# Patient Record
Sex: Female | Born: 1965 | Race: White | Hispanic: No | Marital: Married | Smoking: Never smoker
Health system: Southern US, Community
[De-identification: ages and names within clinical notes are randomized; demographics above are authoritative.]

## PROBLEM LIST (undated history)

## (undated) DIAGNOSIS — K219 Gastro-esophageal reflux disease without esophagitis: Secondary | ICD-10-CM

## (undated) DIAGNOSIS — I499 Cardiac arrhythmia, unspecified: Secondary | ICD-10-CM

## (undated) DIAGNOSIS — R000 Tachycardia, unspecified: Secondary | ICD-10-CM

## (undated) DIAGNOSIS — R42 Dizziness and giddiness: Secondary | ICD-10-CM

## (undated) DIAGNOSIS — F419 Anxiety disorder, unspecified: Secondary | ICD-10-CM

## (undated) DIAGNOSIS — F4310 Post-traumatic stress disorder, unspecified: Secondary | ICD-10-CM

## (undated) DIAGNOSIS — N189 Chronic kidney disease, unspecified: Secondary | ICD-10-CM

## (undated) DIAGNOSIS — F429 Obsessive-compulsive disorder, unspecified: Secondary | ICD-10-CM

## (undated) DIAGNOSIS — I10 Essential (primary) hypertension: Secondary | ICD-10-CM

## (undated) DIAGNOSIS — F319 Bipolar disorder, unspecified: Secondary | ICD-10-CM

## (undated) DIAGNOSIS — F329 Major depressive disorder, single episode, unspecified: Secondary | ICD-10-CM

## (undated) DIAGNOSIS — F32A Depression, unspecified: Secondary | ICD-10-CM

## (undated) HISTORY — DX: Anxiety disorder, unspecified: F41.9

## (undated) HISTORY — DX: Depression, unspecified: F32.A

## (undated) HISTORY — DX: Bipolar disorder, unspecified: F31.9

## (undated) HISTORY — DX: Tachycardia, unspecified: R00.0

## (undated) HISTORY — DX: Major depressive disorder, single episode, unspecified: F32.9

## (undated) HISTORY — DX: Post-traumatic stress disorder, unspecified: F43.10

## (undated) HISTORY — DX: Obsessive-compulsive disorder, unspecified: F42.9

## (undated) HISTORY — PX: OTHER SURGICAL HISTORY: SHX169

---

## 2004-08-14 ENCOUNTER — Emergency Department: Payer: Self-pay | Admitting: Emergency Medicine

## 2008-01-30 ENCOUNTER — Other Ambulatory Visit: Admission: RE | Admit: 2008-01-30 | Discharge: 2008-01-30 | Payer: Self-pay | Admitting: Gynecology

## 2008-01-30 ENCOUNTER — Encounter: Admission: RE | Admit: 2008-01-30 | Discharge: 2008-01-30 | Payer: Self-pay | Admitting: Gynecology

## 2011-06-02 DIAGNOSIS — Z0289 Encounter for other administrative examinations: Secondary | ICD-10-CM | POA: Insufficient documentation

## 2012-01-26 DIAGNOSIS — H68109 Unspecified obstruction of Eustachian tube, unspecified ear: Secondary | ICD-10-CM | POA: Insufficient documentation

## 2012-01-26 DIAGNOSIS — H6500 Acute serous otitis media, unspecified ear: Secondary | ICD-10-CM | POA: Insufficient documentation

## 2012-04-27 ENCOUNTER — Emergency Department: Payer: Self-pay | Admitting: Emergency Medicine

## 2013-10-09 ENCOUNTER — Emergency Department: Payer: Self-pay | Admitting: Emergency Medicine

## 2013-10-09 LAB — COMPREHENSIVE METABOLIC PANEL
ALBUMIN: 4 g/dL (ref 3.4–5.0)
Alkaline Phosphatase: 118 U/L — ABNORMAL HIGH
Anion Gap: 6 — ABNORMAL LOW (ref 7–16)
BILIRUBIN TOTAL: 0.3 mg/dL (ref 0.2–1.0)
BUN: 16 mg/dL (ref 7–18)
CALCIUM: 8.9 mg/dL (ref 8.5–10.1)
CHLORIDE: 106 mmol/L (ref 98–107)
CREATININE: 0.6 mg/dL (ref 0.60–1.30)
Co2: 23 mmol/L (ref 21–32)
GLUCOSE: 84 mg/dL (ref 65–99)
Osmolality: 270 (ref 275–301)
POTASSIUM: 3.6 mmol/L (ref 3.5–5.1)
SGOT(AST): 36 U/L (ref 15–37)
SGPT (ALT): 38 U/L (ref 12–78)
SODIUM: 135 mmol/L — AB (ref 136–145)
TOTAL PROTEIN: 8.6 g/dL — AB (ref 6.4–8.2)

## 2013-10-09 LAB — URINALYSIS, COMPLETE
BACTERIA: NONE SEEN
Bilirubin,UR: NEGATIVE
Glucose,UR: NEGATIVE mg/dL (ref 0–75)
LEUKOCYTE ESTERASE: NEGATIVE
Nitrite: NEGATIVE
PH: 5 (ref 4.5–8.0)
Protein: NEGATIVE
RBC,UR: 1 /HPF (ref 0–5)
Specific Gravity: 1.015 (ref 1.003–1.030)

## 2013-10-09 LAB — CBC
HCT: 41.5 % (ref 35.0–47.0)
HGB: 13.4 g/dL (ref 12.0–16.0)
MCH: 28.1 pg (ref 26.0–34.0)
MCHC: 32.3 g/dL (ref 32.0–36.0)
MCV: 87 fL (ref 80–100)
PLATELETS: 279 10*3/uL (ref 150–440)
RBC: 4.78 10*6/uL (ref 3.80–5.20)
RDW: 14.3 % (ref 11.5–14.5)
WBC: 9.1 10*3/uL (ref 3.6–11.0)

## 2013-10-09 LAB — TROPONIN I

## 2013-10-09 LAB — CK TOTAL AND CKMB (NOT AT ARMC)
CK, TOTAL: 68 U/L
CK-MB: <0.5 ng/mL — ABNORMAL LOW (ref 0.5–3.6)

## 2013-10-09 LAB — APTT: ACTIVATED PTT: 29.2 s (ref 23.6–35.9)

## 2014-08-07 ENCOUNTER — Ambulatory Visit: Payer: Self-pay | Admitting: Family Medicine

## 2014-09-01 ENCOUNTER — Ambulatory Visit
Admit: 2014-09-01 | Disposition: A | Discharge status: Home / Self Care | Payer: Self-pay | Attending: Family Medicine | Admitting: Family Medicine

## 2014-10-02 ENCOUNTER — Ambulatory Visit
Admit: 2014-10-02 | Disposition: A | Discharge status: Home / Self Care | Payer: Self-pay | Attending: Family Medicine | Admitting: Family Medicine

## 2014-10-13 DIAGNOSIS — E785 Hyperlipidemia, unspecified: Secondary | ICD-10-CM | POA: Insufficient documentation

## 2014-11-05 ENCOUNTER — Ambulatory Visit: Payer: Self-pay | Admitting: Dietician

## 2014-11-10 ENCOUNTER — Encounter: Payer: Self-pay | Admitting: Dietician

## 2014-11-10 NOTE — Progress Notes (Deleted)
Subjective:     Patient ID: Jennifer Newman, female   DOB: 20-Apr-1966, 49 y.o.   MRN: [id number]  HPI   Review of Systems     Objective:   Physical Exam     Assessment:     ***    Plan:     ***

## 2014-11-10 NOTE — Progress Notes (Signed)
Mailed discharge letter from [facility] program to Dr. Rutherford Nail.

## 2015-06-27 ENCOUNTER — Emergency Department: Payer: BLUE CROSS/BLUE SHIELD

## 2015-06-27 ENCOUNTER — Encounter: Payer: Self-pay | Admitting: *Deleted

## 2015-06-27 ENCOUNTER — Emergency Department
Admission: EM | Admit: 2015-06-27 | Discharge: 2015-06-27 | Disposition: A | Discharge status: Home / Self Care | Payer: BLUE CROSS/BLUE SHIELD | Attending: Emergency Medicine | Admitting: Emergency Medicine

## 2015-06-27 DIAGNOSIS — M7551 Bursitis of right shoulder: Secondary | ICD-10-CM | POA: Insufficient documentation

## 2015-06-27 DIAGNOSIS — M25511 Pain in right shoulder: Secondary | ICD-10-CM | POA: Diagnosis present

## 2015-06-27 DIAGNOSIS — I10 Essential (primary) hypertension: Secondary | ICD-10-CM | POA: Diagnosis not present

## 2015-06-27 HISTORY — DX: Essential (primary) hypertension: I10

## 2015-06-27 MED ORDER — HYDROCODONE-ACETAMINOPHEN 5-325 MG PO TABS: 1.0000 | ORAL_TABLET | ORAL | Status: DC | PRN

## 2015-06-27 MED ORDER — BACLOFEN 10 MG PO TABS: 10.0000 mg | ORAL_TABLET | Freq: Three times a day (TID) | ORAL | Status: DC

## 2015-06-27 MED ORDER — IBUPROFEN 800 MG PO TABS: 800.0000 mg | ORAL_TABLET | Freq: Three times a day (TID) | ORAL | Status: DC | PRN

## 2015-06-27 NOTE — ED Notes (Signed)
Pt verbalized understanding of discharge instructions. NAD at this time. 

## 2015-06-27 NOTE — ED Provider Notes (Signed)
[facility] Emergency Department Provider Note  ____________________________________________  Time seen: Approximately 12:18 PM  I have reviewed the triage vital signs and the nursing notes.   HISTORY  Chief Complaint Shoulder Pain   HPI Jennifer Newman is a 49 y.o. female presents with a two-day history of right shoulder pain. Patient states that she is unable to extend abduct and abduct or rotate her arm. Denies any specific trauma that she might have slept on it wrong. Past medical history significant for bursitis in that shoulder.   Past Medical History  Diagnosis Date  . Hypertension     There are no active problems to display for this patient.   History reviewed. No pertinent past surgical history.  Current Outpatient Rx  Name  Route  Sig  Dispense  Refill  . baclofen (LIORESAL) 10 MG tablet   Oral   Take 1 tablet (10 mg total) by mouth 3 (three) times daily.   30 tablet   0   . HYDROcodone-acetaminophen (NORCO) 5-325 MG tablet   Oral   Take 1-2 tablets by mouth every 4 (four) hours as needed for moderate pain.   15 tablet   0   . ibuprofen (ADVIL,MOTRIN) 800 MG tablet   Oral   Take 1 tablet (800 mg total) by mouth every 8 (eight) hours as needed.   30 tablet   0     Allergies Review of patient's allergies indicates not on file.  History reviewed. No pertinent family history.  Social History Social History  Substance Use Topics  . Smoking status: Never Smoker   . Smokeless tobacco: None  . Alcohol Use: Yes     Comment: occasionally    Review of Systems Constitutional: No fever/chills Eyes: No visual changes. ENT: No sore throat. Cardiovascular: Denies chest pain. Respiratory: Denies shortness of breath. Gastrointestinal: No abdominal pain.  No nausea, no vomiting.  No diarrhea.  No constipation. Genitourinary: Negative for dysuria. Musculoskeletal: Positive for right shoulder pain. Skin: Negative for  rash. Neurological: Negative for headaches, focal weakness or numbness.  10-point ROS otherwise negative.  ____________________________________________   PHYSICAL EXAM:  VITAL SIGNS: ED Triage Vitals  Enc Vitals Group     BP 06/27/15 1214 148/108 mmHg     Pulse Rate 06/27/15 1214 135     Resp 06/27/15 1214 16     Temp 06/27/15 1214 98.3 F (36.8 C)     Temp Source 06/27/15 1214 Oral     SpO2 --      Weight 06/27/15 1214 200 lb (90.719 kg)     Height 06/27/15 1214 5\' 3"  (1.6 m)     Head Cir --      Peak Flow --      Pain Score --      Pain Loc --      Pain Edu? --      Excl. in [place]? --     Constitutional: Alert and oriented. Well appearing and in no acute distress. Neck: No stridor.  No cervical spinal tenderness to palpation Cardiovascular: Normal rate, regular rhythm. Grossly normal heart sounds.  Good peripheral circulation. Respiratory: Normal respiratory effort.  No retractions. Lungs CTAB. Gastrointestinal: Soft and nontender. No distention. No abdominal bruits. No CVA tenderness. Musculoskeletal: Right shoulder with limited range of motion. Unable to abduct, adduct, extend or rotate. Distally neurovascularly intact. No evidence of bruising. Point tenderness with deep palpation Neurologic:  Normal speech and language. No gross focal neurologic deficits are appreciated. No gait  instability. Skin:  Skin is warm, dry and intact. No rash noted. Psychiatric: Mood and affect are normal. Speech and behavior are normal.  ____________________________________________   LABS (all labs ordered are listed, but only abnormal results are displayed)  Labs Reviewed - No data to display ____________________________________________   RADIOLOGY  IMPRESSION: No acute abnormality.  Findings compatible with calcific subacromial/subdeltoid bursitis versus calcific rotator cuff tendinopathy.  Mild appearing acromioclavicular  osteoarthritis. ____________________________________________   PROCEDURES  Procedure(s) performed: None  Critical Care performed: No  ____________________________________________   INITIAL IMPRESSION / ASSESSMENT AND PLAN / ED COURSE  Pertinent labs & imaging results that were available during my care of the patient were reviewed by me and considered in my medical decision making (see chart for details).  Suspect acute right shoulder strain with no specific injury. We'll get x-rays to confirm diagnosis patient is no osseous indications.  Acute right shoulder sling provided for comfort. Rx given for Motrin 800 mg 3 times a day, baclofen 10 mg 3 times a day, and 5/325 #8. Patient follow-up PCP or return here with any worsening symptomology. ____________________________________________   FINAL CLINICAL IMPRESSION(S) / ED DIAGNOSES  Final diagnoses:  Bursitis of shoulder region, right      Arlyss Repress, PA-C 06/27/15 1300  Orbie Pyo, MD 06/27/15 [phone]
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
## 2015-06-27 NOTE — Discharge Instructions (Signed)
Bursitis Bursitis is inflammation and irritation of a bursa, which is one of the small, fluid-filled sacs that cushion and protect the moving parts of your body. These sacs are located between bones and muscles, muscle attachments, or skin areas next to bones. A bursa protects these structures from the wear and tear that results from frequent movement. An inflamed bursa causes pain and swelling. Fluid may build up inside the sac. Bursitis is most common near joints, especially the knees, elbows, hips, and shoulders. CAUSES Bursitis can be caused by:   Injury from:  A direct blow, like falling on your knee or elbow.  Overuse of a joint (repetitive stress).  Infection. This can happen if bacteria gets into a bursa through a cut or scrape near a joint.  Diseases that cause joint inflammation, such as gout and rheumatoid arthritis. RISK FACTORS You may be at risk for bursitis if you:   Have a job or hobby that involves a lot of repetitive stress on your joints.  Have a condition that weakens your body's defense system (immune system), such as diabetes, cancer, or HIV.  Lift and reach overhead often.  Kneel or lean on hard surfaces often.  Run or walk often. SIGNS AND SYMPTOMS The most common signs and symptoms of bursitis are:  Pain that gets worse when you move the affected body part or put weight on it.  Inflammation.  Stiffness. Other signs and symptoms may include:  Redness.  Tenderness.  Warmth.  Pain that continues after rest.  Fever and chills. This may occur in bursitis caused by infection. DIAGNOSIS Bursitis may be diagnosed by:   Medical history and physical exam.  MRI.  A procedure to drain fluid from the bursa with a needle (aspiration). The fluid may be checked for signs of infection or gout.  Blood tests to rule out other causes of inflammation. TREATMENT  Bursitis can usually be treated at home with rest, ice, compression, and elevation (RICE). For  mild bursitis, RICE treatment may be all you need. Other treatments may include:  Nonsteroidal anti-inflammatory drugs (NSAIDs) to treat pain and inflammation.  Corticosteroids to fight inflammation. You may have these drugs injected into and around the area of bursitis.  Aspiration of bursitis fluid to relieve pain and improve movement.  Antibiotic medicine to treat an infected bursa.  A splint, brace, or walking aid.  Physical therapy if you continue to have pain or limited movement.  Surgery to remove a damaged or infected bursa. This may be needed if you have a very bad case of bursitis or if other treatments have not worked. HOME CARE INSTRUCTIONS   Take medicines only as directed by your health care provider.  If you were prescribed an antibiotic medicine, finish it all even if you start to feel better.  Rest the affected area as directed by your health care provider.  Keep the area elevated.  Avoid activities that make pain worse.  Apply ice to the injured area:  Place ice in a plastic bag.  Place a towel between your skin and the bag.  Leave the ice on for 20 minutes, 2-3 times a day.  Use splints, braces, pads, or walking aids as directed by your health care provider.  Keep all follow-up visits as directed by your health care provider. This is important. PREVENTION   Wear knee pads if you kneel often.  Wear sturdy running or walking shoes that fit you well.  Take regular breaks from repetitive activity.  Warm  up by stretching before doing any strenuous activity.  Maintain a healthy weight or lose weight as recommended by your health care provider. Ask your health care provider if you need help.  Exercise regularly. Start any new physical activity gradually. SEEK MEDICAL CARE IF:   Your bursitis is not responding to treatment or home care.  You have a fever.  You have chills.   This information is not intended to replace advice given to you by your  health care provider. Make sure you discuss any questions you have with your health care provider.   Document Released: 06/16/2000 Document Revised: 03/10/2015 Document Reviewed: 09/08/2013 Elsevier Interactive Patient Education 2016 [place] therapy can help ease sore, stiff, injured, and tight muscles and joints. Heat relaxes your muscles, which may help ease your pain.  RISKS AND COMPLICATIONS If you have any of the following conditions, do not use heat therapy unless your health care provider has approved:  Poor circulation.  Healing wounds or scarred skin in the area being treated.  Diabetes, heart disease, or high blood pressure.  Not being able to feel (numbness) the area being treated.  Unusual swelling of the area being treated.  Active infections.  Blood clots.  Cancer.  Inability to communicate pain. This may include young children and people who have problems with their brain function (dementia).  Pregnancy. Heat therapy should only be used on old, pre-existing, or long-lasting (chronic) injuries. Do not use heat therapy on new injuries unless directed by your health care provider. HOW TO USE HEAT THERAPY There are several different kinds of heat therapy, including:  Moist heat pack.  Warm water bath.  Hot water bottle.  Electric heating pad.  Heated gel pack.  Heated wrap.  Electric heating pad. Use the heat therapy method suggested by your health care provider. Follow your health care provider's instructions on when and how to use heat therapy. GENERAL HEAT THERAPY RECOMMENDATIONS  Do not sleep while using heat therapy. Only use heat therapy while you are awake.  Your skin may turn pink while using heat therapy. Do not use heat therapy if your skin turns red.  Do not use heat therapy if you have new pain.  High heat or long exposure to heat can cause burns. Be careful when using heat therapy to avoid burning your  skin.  Do not use heat therapy on areas of your skin that are already irritated, such as with a rash or sunburn. SEEK MEDICAL CARE IF:  You have blisters, redness, swelling, or numbness.  You have new pain.  Your pain is worse. MAKE SURE YOU:  Understand these instructions.  Will watch your condition.  Will get help right away if you are not doing well or get worse.   This information is not intended to replace advice given to you by your health care provider. Make sure you discuss any questions you have with your health care provider.   Document Released: 09/11/2011 Document Revised: 07/10/2014 Document Reviewed: 08/12/2013 Elsevier Interactive Patient Education [employer].

## 2016-12-11 ENCOUNTER — Inpatient Hospital Stay
Admission: EM | Admit: 2016-12-11 | Discharge: 2016-12-14 | DRG: 310 | Disposition: A | Discharge status: Home / Self Care | Payer: BLUE CROSS/BLUE SHIELD | Attending: Internal Medicine | Admitting: Internal Medicine

## 2016-12-11 ENCOUNTER — Emergency Department: Payer: BLUE CROSS/BLUE SHIELD

## 2016-12-11 ENCOUNTER — Encounter: Payer: Self-pay | Admitting: Emergency Medicine

## 2016-12-11 DIAGNOSIS — I471 Supraventricular tachycardia: Secondary | ICD-10-CM | POA: Diagnosis not present

## 2016-12-11 DIAGNOSIS — R0602 Shortness of breath: Secondary | ICD-10-CM

## 2016-12-11 DIAGNOSIS — R945 Abnormal results of liver function studies: Secondary | ICD-10-CM | POA: Diagnosis present

## 2016-12-11 DIAGNOSIS — R51 Headache: Secondary | ICD-10-CM | POA: Diagnosis present

## 2016-12-11 DIAGNOSIS — E860 Dehydration: Secondary | ICD-10-CM | POA: Diagnosis present

## 2016-12-11 DIAGNOSIS — R1010 Upper abdominal pain, unspecified: Secondary | ICD-10-CM | POA: Diagnosis present

## 2016-12-11 DIAGNOSIS — I10 Essential (primary) hypertension: Secondary | ICD-10-CM | POA: Diagnosis present

## 2016-12-11 DIAGNOSIS — R519 Headache, unspecified: Secondary | ICD-10-CM

## 2016-12-11 DIAGNOSIS — F329 Major depressive disorder, single episode, unspecified: Secondary | ICD-10-CM | POA: Diagnosis present

## 2016-12-11 DIAGNOSIS — Z79899 Other long term (current) drug therapy: Secondary | ICD-10-CM

## 2016-12-11 DIAGNOSIS — R000 Tachycardia, unspecified: Secondary | ICD-10-CM

## 2016-12-11 HISTORY — DX: Dizziness and giddiness: R42

## 2016-12-11 LAB — URINALYSIS, COMPLETE (UACMP) WITH MICROSCOPIC
Bilirubin Urine: NEGATIVE
GLUCOSE, UA: NEGATIVE mg/dL
HGB URINE DIPSTICK: NEGATIVE
Ketones, ur: NEGATIVE mg/dL
Leukocytes, UA: NEGATIVE
NITRITE: NEGATIVE
PH: 6 (ref 5.0–8.0)
PROTEIN: NEGATIVE mg/dL
Specific Gravity, Urine: 1.023 (ref 1.005–1.030)

## 2016-12-11 LAB — COMPREHENSIVE METABOLIC PANEL
ALK PHOS: 197 U/L — AB (ref 38–126)
ALT: 57 U/L — AB (ref 14–54)
ANION GAP: 11 (ref 5–15)
AST: 64 U/L — ABNORMAL HIGH (ref 15–41)
Albumin: 4.2 g/dL (ref 3.5–5.0)
BILIRUBIN TOTAL: 0.9 mg/dL (ref 0.3–1.2)
BUN: 17 mg/dL (ref 6–20)
CALCIUM: 9.3 mg/dL (ref 8.9–10.3)
CO2: 24 mmol/L (ref 22–32)
CREATININE: 0.64 mg/dL (ref 0.44–1.00)
Chloride: 101 mmol/L (ref 101–111)
GFR calc non Af Amer: >60 mL/min (ref >60)
Glucose, Bld: 140 mg/dL — ABNORMAL HIGH (ref 65–99)
Potassium: 4 mmol/L (ref 3.5–5.1)
Sodium: 136 mmol/L (ref 135–145)
TOTAL PROTEIN: 8.7 g/dL — AB (ref 6.5–8.1)

## 2016-12-11 LAB — CBC
HCT: 44 % (ref 35.0–47.0)
HEMOGLOBIN: 14.8 g/dL (ref 12.0–16.0)
MCH: 29.4 pg (ref 26.0–34.0)
MCHC: 33.6 g/dL (ref 32.0–36.0)
MCV: 87.3 fL (ref 80.0–100.0)
PLATELETS: 256 10*3/uL (ref 150–440)
RBC: 5.04 MIL/uL (ref 3.80–5.20)
RDW: 14.7 % — ABNORMAL HIGH (ref 11.5–14.5)
WBC: 8.9 10*3/uL (ref 3.6–11.0)

## 2016-12-11 LAB — LIPASE, BLOOD: Lipase: 18 U/L (ref 11–51)

## 2016-12-11 LAB — LACTIC ACID, PLASMA: Lactic Acid, Venous: 1.7 mmol/L (ref 0.5–1.9)

## 2016-12-11 LAB — TROPONIN I: Troponin I: <0.03 ng/mL (ref <0.03)

## 2016-12-11 MED ORDER — MORPHINE SULFATE (PF) 4 MG/ML IV SOLN
4.0000 mg | Freq: Once | INTRAVENOUS | Status: AC
  Administered 2016-12-11: 4 mg via INTRAVENOUS
  Filled 2016-12-11: qty 1

## 2016-12-11 MED ORDER — ONDANSETRON HCL 4 MG/2ML IJ SOLN
4.0000 mg | INTRAMUSCULAR | Status: AC
  Administered 2016-12-11: 4 mg via INTRAVENOUS
  Filled 2016-12-11: qty 2

## 2016-12-11 MED ORDER — ONDANSETRON 4 MG PO TBDP
4.0000 mg | ORAL_TABLET | Freq: Once | ORAL | Status: AC
  Administered 2016-12-11: 4 mg via ORAL
  Filled 2016-12-11: qty 1

## 2016-12-11 MED ORDER — SODIUM CHLORIDE 0.9 % IV BOLUS (SEPSIS)
1000.0000 mL | INTRAVENOUS | Status: AC
  Administered 2016-12-12: 1000 mL via INTRAVENOUS

## 2016-12-11 MED ORDER — IOPAMIDOL (ISOVUE-300) INJECTION 61%
30.0000 mL | Freq: Once | INTRAVENOUS | Status: AC | PRN
Start: 2016-12-11 — End: 2016-12-12
  Administered 2016-12-12: 30 mL via ORAL

## 2016-12-11 MED ORDER — SODIUM CHLORIDE 0.9 % IV BOLUS (SEPSIS)
1000.0000 mL | INTRAVENOUS | Status: AC
  Administered 2016-12-11: 1000 mL via INTRAVENOUS

## 2016-12-11 NOTE — ED Notes (Signed)
Pt states that she is nauseous, dizzy, and hives with body aches. Started Friday. Family at bedside.

## 2016-12-11 NOTE — ED Provider Notes (Signed)
[facility] Emergency Department Provider Note  ____________________________________________   First MD Initiated Contact with Patient 12/11/16 2232     (approximate)  I have reviewed the triage vital signs and the nursing notes.   HISTORY  Chief Complaint Abdominal Pain    HPI Jennifer Newman is a 51 y.o. female with no significant past medical history who presents for evaluation of several days of intermittent upper abdominal pain.  She states that the episode first started acutely within a few hours of eating on Friday which was 3 days ago.  The pain was moderate in intensity and felt both sharp and aching.  It was accompanied with some nausea and vomiting but all the symptoms were milder than they are today.  She states that she still had some mild residual aching discomfort over the weekend but it mostly was better.  She had a few loose stools.  Today after breakfast the pain, nausea, vomiting, and loose stools all came back acutely and are severe.  Nothing in particular makes the patient's symptoms better nor worse.  She has not been able to eat or drink anything essentially all day today.The pain is still located just below her rib cage in the center and on the right side of her upper abdomen.  She denies fever/chills and dysuria.  She feels short of breath as a result of the pain.   Past Medical History:  Diagnosis Date  . Hypertension     There are no active problems to display for this patient.   History reviewed. No pertinent surgical history.  Prior to Admission medications   Medication Sig Start Date End Date Taking? Authorizing Provider  baclofen (LIORESAL) 10 MG tablet Take 1 tablet (10 mg total) by mouth 3 (three) times daily. 06/27/15   Beers, Pierce Crane, PA-C  HYDROcodone-acetaminophen (NORCO) 5-325 MG tablet Take 1-2 tablets by mouth every 4 (four) hours as needed for moderate pain. 06/27/15   Beers, Pierce Crane, PA-C  ibuprofen  (ADVIL,MOTRIN) 800 MG tablet Take 1 tablet (800 mg total) by mouth every 8 (eight) hours as needed. 06/27/15   Arlyss Repress, PA-C    Allergies Patient has no known allergies.  History reviewed. No pertinent family history.  Social History Social History  Substance Use Topics  . Smoking status: Never Smoker  . Smokeless tobacco: Never Used  . Alcohol use Yes     Comment: occasionally    Review of Systems Constitutional: No fever/chills Eyes: No visual changes. ENT: No sore throat. Cardiovascular: Denies chest pain. Respiratory: shortness of breath Associated with the pain in her upper abdomen Gastrointestinal: Severe epigastric and right upper quadrant abdominal pain that occurred acutely 3 days ago and then again today, associated with nausea, vomiting, and diarrhea Genitourinary: Negative for dysuria. Musculoskeletal: Negative for neck pain.  Negative for back pain. Integumentary: Negative for rash. Neurological: Negative for headaches, focal weakness or numbness.   ____________________________________________   PHYSICAL EXAM:  VITAL SIGNS: ED Triage Vitals  Enc Vitals Group     BP 12/11/16 2226 (!) 152/102     Pulse Rate 12/11/16 2226 (!) 150     Resp 12/11/16 2226 20     Temp 12/11/16 2209 98.2 F (36.8 C)     Temp Source 12/11/16 2209 Oral     SpO2 12/11/16 2226 96 %     Weight 12/11/16 2209 90.7 kg (200 lb)     Height --      Head Circumference --  Peak Flow --      Pain Score 12/11/16 2225 3     Pain Loc --      Pain Edu? --      Excl. in [place]? --     Constitutional: Alert and oriented. The patient looks generally well although uncomfortable.  Nontoxic appearance in spite of her tachycardia Eyes: Conjunctivae are normal.  Head: Atraumatic. Neck: No stridor.  No meningeal signs.   Cardiovascular: Moderate tachycardia in the 150s, regular rhythm. Good peripheral circulation. Grossly normal heart sounds. Respiratory: Normal respiratory effort.  No  retractions. Lungs CTAB. Gastrointestinal: Obese.  Soft with severe tenderness to palpation of the epigastrium and right upper quadrant with positive Murphy sign.  Diffusely tender throughout the rest of her abdomen but without focal tenderness in the lower abdomen Musculoskeletal: No lower extremity tenderness nor edema. No gross deformities of extremities. Neurologic:  Normal speech and language. No gross focal neurologic deficits are appreciated.  Skin:  Skin is warm, dry and intact. No rash noted. Psychiatric: Mood and affect are normal. Speech and behavior are normal.  ____________________________________________   LABS (all labs ordered are listed, but only abnormal results are displayed)  Labs Reviewed  COMPREHENSIVE METABOLIC PANEL - Abnormal; Notable for the following:       Result Value   Glucose, Bld 140 (*)    Total Protein 8.7 (*)    AST 64 (*)    ALT 57 (*)    Alkaline Phosphatase 197 (*)    All other components within normal limits  CBC - Abnormal; Notable for the following:    RDW 14.7 (*)    All other components within normal limits  URINALYSIS, COMPLETE (UACMP) WITH MICROSCOPIC - Abnormal; Notable for the following:    Color, Urine YELLOW (*)    APPearance HAZY (*)    Bacteria, UA MANY (*)    Squamous Epithelial / LPF 0-5 (*)    All other components within normal limits  LIPASE, BLOOD  TROPONIN I  LACTIC ACID, PLASMA  LACTIC ACID, PLASMA   ____________________________________________  EKG  ED ECG REPORT I, Richard Ritchey, the attending physician, personally viewed and interpreted this ECG.  Date: 12/11/2016 EKG Time: 22:11 Rate: 157 Rhythm: normal sinus rhythm QRS Axis: normal Intervals: normal ST/T Wave abnormalities: normal Narrative Interpretation: unremarkable  ____________________________________________  RADIOLOGY   US Abdomen Limited Ruq  Result Date: 12/11/2016 CLINICAL DATA:  Acute onset of severe epigastric and right upper quadrant  abdominal pain. Nausea, loose stools and tachycardia. Initial encounter. EXAM: ULTRASOUND ABDOMEN LIMITED RIGHT UPPER QUADRANT COMPARISON:  None. FINDINGS: Gallbladder: No gallstones or wall thickening visualized. No sonographic Murphy sign noted by sonographer. Common bile duct: Diameter: 0.5 cm, within normal limits in caliber. Liver: No focal lesion identified. Diffuse increased parenchymal echogenicity and coarsened echotexture likely reflects fatty infiltration. IMPRESSION: 1. No acute abnormality at the right upper quadrant. 2. Diffuse fatty infiltration within the liver. Electronically Signed   By: Garald Balding M.D.   On: 12/11/2016 23:44    ____________________________________________   PROCEDURES  Critical Care performed: No   Procedure(s) performed:   Procedures   ____________________________________________   INITIAL IMPRESSION / ASSESSMENT AND PLAN / ED COURSE  Pertinent labs & imaging results that were available during my care of the patient were reviewed by me and considered in my medical decision making (see chart for details).  The patient's tachycardia is concerning and is likely the result of her pain, vomiting and diarrhea, decreased oral intake, and acute  illness.  She is, however, having no chest pain nor shortness of breath (except for the fact that breathing and especially taking deep breaths worsens the abdominal pain).  She is afebrile and has an SPO2 of 96%.  Her differential is broad but I suspect gallbladder disease is the most likely diagnosis.  Given the degree of her tachycardia I will provide 2 L of IV fluid and pain medication as well as Zofran (she vomited shortly after receiving an ODT Zofran as per nursing protocols).  We will obtain a right upper quadrant ultrasound.  If the ultrasound is normal I will proceed with CT angiogram chest to rule out PE and CT with oral and IV contrast of her abdomen and pelvis.  The patient and her husband understand and  agree with this plan.   Clinical Course as of Dec 12 33  Mon Dec 11, 2016  2331 The patient's lactic acid is within normal limits and she does not have a leukocytosis.  She has a slightly elevated AST, ALT, and alkaline phosphatase and I still suspected gallbladder disease is the most likely diagnosis.  [CF]  2350 Ultrasound is unremarkable.  Given no evidence of acute gallbladder disease, I will proceed with a CT scan of her abdomen and pelvis as well as CT angiogram of her chest to rule out pulmonary embolism.  I updated the patient and her husband.  [CF]  [place] ED care to Dr. Beather Arbour to follow up CT results and reassess.  [CF]    Clinical Course User Index [CF] Hinda Kehr, MD    ____________________________________________  FINAL CLINICAL IMPRESSION(S) / ED DIAGNOSES  Final diagnoses:  Shortness of breath  Tachycardia  Pain of upper abdomen     MEDICATIONS GIVEN DURING THIS VISIT:  Medications  ondansetron (ZOFRAN-ODT) disintegrating tablet 4 mg (4 mg Oral Given 12/11/16 2231)  sodium chloride 0.9 % bolus 1,000 mL (0 mLs Intravenous Stopped 12/12/16 0012)  morphine 4 MG/ML injection 4 mg (4 mg Intravenous Given 12/11/16 2246)  ondansetron (ZOFRAN) injection 4 mg (4 mg Intravenous Given 12/11/16 2256)  sodium chloride 0.9 % bolus 1,000 mL (1,000 mLs Intravenous New Bag/Given 12/12/16 0018)  iopamidol (ISOVUE-300) 61 % injection 30 mL (30 mLs Oral Contrast Given 12/12/16 0001)     NEW OUTPATIENT MEDICATIONS STARTED DURING THIS VISIT:  New Prescriptions   No medications on file    Modified Medications   No medications on file    Discontinued Medications   No medications on file     Note:  This document was prepared using Dragon voice recognition software and may include unintentional dictation errors.    Hinda Kehr, MD 12/12/16 [phone]

## 2016-12-11 NOTE — ED Triage Notes (Signed)
Pt to triage in wheelchair. Pt c/o upper abdominal pain, N/V/D since Friday. Pt is tachycardic in triage at 156 bpm, nauseas and pale in color.

## 2016-12-12 ENCOUNTER — Emergency Department: Payer: BLUE CROSS/BLUE SHIELD

## 2016-12-12 DIAGNOSIS — R000 Tachycardia, unspecified: Secondary | ICD-10-CM | POA: Diagnosis present

## 2016-12-12 LAB — CBC
HCT: 37.7 % (ref 35.0–47.0)
HEMOGLOBIN: 12.7 g/dL (ref 12.0–16.0)
MCH: 29.5 pg (ref 26.0–34.0)
MCHC: 33.8 g/dL (ref 32.0–36.0)
MCV: 87.3 fL (ref 80.0–100.0)
Platelets: 209 10*3/uL (ref 150–440)
RBC: 4.32 MIL/uL (ref 3.80–5.20)
RDW: 14.9 % — ABNORMAL HIGH (ref 11.5–14.5)
WBC: 7.3 10*3/uL (ref 3.6–11.0)

## 2016-12-12 LAB — BASIC METABOLIC PANEL
Anion gap: 7 (ref 5–15)
BUN: 12 mg/dL (ref 6–20)
CALCIUM: 8 mg/dL — AB (ref 8.9–10.3)
CO2: 27 mmol/L (ref 22–32)
CREATININE: 0.7 mg/dL (ref 0.44–1.00)
Chloride: 105 mmol/L (ref 101–111)
GFR calc non Af Amer: >60 mL/min (ref >60)
Glucose, Bld: 122 mg/dL — ABNORMAL HIGH (ref 65–99)
Potassium: 3.6 mmol/L (ref 3.5–5.1)
SODIUM: 139 mmol/L (ref 135–145)

## 2016-12-12 LAB — TSH: TSH: 1.016 u[IU]/mL (ref 0.350–4.500)

## 2016-12-12 LAB — T4, FREE: FREE T4: 0.64 ng/dL (ref 0.61–1.12)

## 2016-12-12 LAB — PREGNANCY, URINE: Preg Test, Ur: NEGATIVE

## 2016-12-12 MED ORDER — SODIUM CHLORIDE 0.9 % IV BOLUS (SEPSIS)
1000.0000 mL | Freq: Once | INTRAVENOUS | Status: AC
  Administered 2016-12-12: 1000 mL via INTRAVENOUS

## 2016-12-12 MED ORDER — MORPHINE SULFATE (PF) 4 MG/ML IV SOLN
4.0000 mg | Freq: Once | INTRAVENOUS | Status: AC
  Administered 2016-12-12: 4 mg via INTRAVENOUS
  Filled 2016-12-12: qty 1

## 2016-12-12 MED ORDER — ACETAMINOPHEN 650 MG RE SUPP: 650.0000 mg | Freq: Four times a day (QID) | RECTAL | Status: DC | PRN

## 2016-12-12 MED ORDER — FAMOTIDINE IN NACL 20-0.9 MG/50ML-% IV SOLN
20.0000 mg | Freq: Once | INTRAVENOUS | Status: AC
  Administered 2016-12-12: 20 mg via INTRAVENOUS
  Filled 2016-12-12: qty 50

## 2016-12-12 MED ORDER — METOPROLOL TARTRATE 5 MG/5ML IV SOLN
2.5000 mg | Freq: Once | INTRAVENOUS | Status: AC
  Administered 2016-12-12: 2.5 mg via INTRAVENOUS
  Filled 2016-12-12: qty 5

## 2016-12-12 MED ORDER — ENOXAPARIN SODIUM 40 MG/0.4ML ~~LOC~~ SOLN
40.0000 mg | Freq: Two times a day (BID) | SUBCUTANEOUS | Status: DC
  Administered 2016-12-12 – 2016-12-14 (×5): 40 mg via SUBCUTANEOUS
  Filled 2016-12-12 (×5): qty 0.4

## 2016-12-12 MED ORDER — METOPROLOL SUCCINATE ER 25 MG PO TB24
25.0000 mg | ORAL_TABLET | Freq: Two times a day (BID) | ORAL | Status: DC
  Administered 2016-12-12 (×2): 25 mg via ORAL
  Filled 2016-12-12 (×2): qty 1

## 2016-12-12 MED ORDER — SODIUM CHLORIDE 0.9 % IV SOLN
INTRAVENOUS | Status: DC
  Administered 2016-12-12 – 2016-12-13 (×3): via INTRAVENOUS

## 2016-12-12 MED ORDER — ONDANSETRON HCL 4 MG PO TABS: 4.0000 mg | ORAL_TABLET | Freq: Four times a day (QID) | ORAL | Status: DC | PRN

## 2016-12-12 MED ORDER — DULOXETINE HCL 30 MG PO CPEP
90.0000 mg | ORAL_CAPSULE | Freq: Every day | ORAL | Status: DC
  Administered 2016-12-13 – 2016-12-14 (×2): 90 mg via ORAL
  Filled 2016-12-12 (×2): qty 3

## 2016-12-12 MED ORDER — ACETAMINOPHEN 325 MG PO TABS
650.0000 mg | ORAL_TABLET | Freq: Four times a day (QID) | ORAL | Status: DC | PRN
  Administered 2016-12-12: 650 mg via ORAL
  Filled 2016-12-12: qty 2

## 2016-12-12 MED ORDER — DULOXETINE HCL 30 MG PO CPEP
60.0000 mg | ORAL_CAPSULE | Freq: Every day | ORAL | Status: DC
  Administered 2016-12-12: 60 mg via ORAL
  Filled 2016-12-12: qty 2

## 2016-12-12 MED ORDER — ACETAMINOPHEN 325 MG PO TABS
650.0000 mg | ORAL_TABLET | Freq: Four times a day (QID) | ORAL | Status: DC | PRN
  Administered 2016-12-12 – 2016-12-14 (×5): 650 mg via ORAL
  Filled 2016-12-12 (×5): qty 2

## 2016-12-12 MED ORDER — IOPAMIDOL (ISOVUE-370) INJECTION 76%
100.0000 mL | Freq: Once | INTRAVENOUS | Status: AC | PRN
  Administered 2016-12-12: 100 mL via INTRAVENOUS

## 2016-12-12 MED ORDER — MORPHINE SULFATE (PF) 2 MG/ML IV SOLN
2.0000 mg | INTRAVENOUS | Status: DC | PRN
  Administered 2016-12-12: 2 mg via INTRAVENOUS
  Filled 2016-12-12: qty 1

## 2016-12-12 MED ORDER — ONDANSETRON HCL 4 MG/2ML IJ SOLN
4.0000 mg | Freq: Four times a day (QID) | INTRAMUSCULAR | Status: DC | PRN
  Administered 2016-12-13: 4 mg via INTRAVENOUS
  Filled 2016-12-12: qty 2

## 2016-12-12 MED ORDER — ONDANSETRON HCL 4 MG/2ML IJ SOLN
4.0000 mg | Freq: Once | INTRAMUSCULAR | Status: AC
  Administered 2016-12-12: 4 mg via INTRAVENOUS
  Filled 2016-12-12: qty 2

## 2016-12-12 MED ORDER — METOPROLOL TARTRATE 5 MG/5ML IV SOLN
5.0000 mg | INTRAVENOUS | Status: DC | PRN
  Administered 2016-12-12 – 2016-12-13 (×3): 5 mg via INTRAVENOUS
  Filled 2016-12-12 (×2): qty 5

## 2016-12-12 NOTE — Progress Notes (Signed)
Patient seen and examined. Patient's abdominal pain has resolved. Patient's heart rate has improved. Continue to monitor for 24 hours for recurrent abdominal pain or tachycardia.  Agree with M.D. admitting plan.

## 2016-12-12 NOTE — ED Notes (Signed)
Gave pt Ginger Ale

## 2016-12-12 NOTE — Progress Notes (Deleted)
   12/12/16 1250  Clinical Encounter Type  Visited With Other (Comment)  Visit Type Spiritual support  Referral From Nurse   Chaplain responded to a request for consul for advance directive education. Patient not available as patient was discharged before Chaplain arrival.  Valentina Lucks, Chaplain

## 2016-12-12 NOTE — H&P (Signed)
[facility] [place] NAME: Jennifer Newman    MR#:  [id number]  DATE OF BIRTH:  08/02/1965  DATE OF ADMISSION:  12/11/2016  PRIMARY CARE PHYSICIAN: System, Pcp Not In   REQUESTING/REFERRING PHYSICIAN:   CHIEF COMPLAINT:   Chief Complaint  Patient presents with  . Abdominal Pain    HISTORY OF PRESENT ILLNESS: Jennifer Newman  is a 51 y.o. female with a known history of Hypertension presented to the emergency room with abdominal discomfort since Friday. The pain is generalized and aching in nature 4 out of 10 on a scale of 1-10. Patient said she ate some grilled chicken sandwich on Saturday and got some hives which later on resolved. No nausea and vomiting. An presented to the history and she was tachycardic with heart rate around 120 bpm. She was worked up with CT angiogram of the chest which showed no pulmonary embolism. She received 3 L of IV fluids in the emergency room but continued to be tachycardic. Abdominal pain improved. Patient had bowel movement yesterday. Hospitalist service was consulted for further care of the patient. No history of any cardiac arrhythmia.  PAST MEDICAL HISTORY:   Past Medical History:  Diagnosis Date  . Hypertension     PAST SURGICAL HISTORY: Past Surgical History:  Procedure Laterality Date  . none      SOCIAL HISTORY:  Social History  Substance Use Topics  . Smoking status: Never Smoker  . Smokeless tobacco: Never Used  . Alcohol use Yes     Comment: occasionally    FAMILY HISTORY:  Family History  Problem Relation Age of Onset  . COPD Neg Hx   . Diabetes Neg Hx   . Hypertension Neg Hx   . CAD Neg Hx     DRUG ALLERGIES: No Known Allergies  REVIEW OF SYSTEMS:   CONSTITUTIONAL: No fever, fatigue or weakness.  EYES: No blurred or double vision.  EARS, NOSE, AND THROAT: No tinnitus or ear pain.  RESPIRATORY: No cough, shortness of breath, wheezing or hemoptysis.  CARDIOVASCULAR: No  chest pain, orthopnea, edema.  GASTROINTESTINAL: No nausea, vomiting, diarrhea  has abdominal pain.  GENITOURINARY: No dysuria, hematuria.  ENDOCRINE: No polyuria, nocturia,  HEMATOLOGY: No anemia, easy bruising or bleeding SKIN: No rash or lesion. MUSCULOSKELETAL: No joint pain or arthritis.   NEUROLOGIC: No tingling, numbness, weakness.  PSYCHIATRY: No anxiety or depression.   MEDICATIONS AT HOME:  Prior to Admission medications   Medication Sig Start Date End Date Taking? Authorizing Provider  DULoxetine (CYMBALTA) 30 MG capsule Take 1 capsule by mouth daily. 12/04/16  Yes [provider]  DULoxetine (CYMBALTA) 60 MG capsule Take 1 capsule by mouth daily. 12/04/16  Yes [provider]  furosemide (LASIX) 40 MG tablet Take 1 tablet by mouth daily. 08/08/16 04/22/17 Yes [provider]  hydrochlorothiazide (HYDRODIURIL) 12.5 MG tablet Take 1 tablet by mouth daily. 08/08/16 08/08/17 Yes [provider]  loratadine (CLARITIN) 10 MG tablet Take 1 tablet by mouth daily. 03/03/14  Yes [provider]  metoprolol succinate (TOPROL-XL) 25 MG 24 hr tablet Take 1 tablet by mouth 2 (two) times daily. 08/08/16 08/08/17 Yes [provider]      PHYSICAL EXAMINATION:   VITAL SIGNS: Blood pressure 131/86, pulse (!) 135, temperature 98.2 F (36.8 C), temperature source Oral, resp. rate 19, weight 90.7 kg (200 lb), SpO2 91 %.  GENERAL:  51 y.o.-year-old patient lying in the bed with no acute distress.  EYES: Pupils equal, round, reactive to light and accommodation. No scleral icterus. Extraocular muscles intact.  HEENT: Head atraumatic, normocephalic. Oropharynx and nasopharynx clear.  NECK:  Supple, no jugular venous distention. No thyroid enlargement, no tenderness.  LUNGS: Normal breath sounds bilaterally, no wheezing, rales,rhonchi or crepitation. No use of accessory muscles of respiration.  CARDIOVASCULAR: S1, S2 tachycardia noted. No murmurs, rubs, or  gallops.  ABDOMEN: Soft, mild tenderness around umbilicus, nondistended. Bowel sounds present. No organomegaly or mass.  EXTREMITIES: No pedal edema, cyanosis, or clubbing.  NEUROLOGIC: Cranial nerves II through XII are intact. Muscle strength 5/5 in all extremities. Sensation intact. Gait not checked.  PSYCHIATRIC: The patient is alert and oriented x 3.  SKIN: No obvious rash, lesion, or ulcer.   LABORATORY PANEL:   CBC  Recent Labs Lab 12/11/16 2211  WBC 8.9  HGB 14.8  HCT 44.0  PLT 256  MCV 87.3  MCH 29.4  MCHC 33.6  RDW 14.7*   ------------------------------------------------------------------------------------------------------------------  Chemistries   Recent Labs Lab 12/11/16 2211  NA 136  K 4.0  CL 101  CO2 24  GLUCOSE 140*  BUN 17  CREATININE 0.64  CALCIUM 9.3  AST 64*  ALT 57*  ALKPHOS 197*  BILITOT 0.9   ------------------------------------------------------------------------------------------------------------------ CrCl cannot be calculated (Unknown ideal weight.). ------------------------------------------------------------------------------------------------------------------  Recent Labs  12/11/16 2211  TSH 1.016     Coagulation profile No results for input(s): INR, PROTIME in the last 168 hours. ------------------------------------------------------------------------------------------------------------------- No results for input(s): DDIMER in the last 72 hours. -------------------------------------------------------------------------------------------------------------------  Cardiac Enzymes  Recent Labs Lab 12/11/16 2211  TROPONINI <0.03   ------------------------------------------------------------------------------------------------------------------ Invalid input(s): POCBNP  ---------------------------------------------------------------------------------------------------------------  Urinalysis    Component Value  Date/Time   COLORURINE YELLOW (A) 12/11/2016 2211   APPEARANCEUR HAZY (A) 12/11/2016 2211   APPEARANCEUR Hazy 10/09/2013 1623   LABSPEC 1.023 12/11/2016 2211   LABSPEC 1.015 10/09/2013 1623   PHURINE 6.0 12/11/2016 2211   GLUCOSEU NEGATIVE 12/11/2016 2211   GLUCOSEU Negative 10/09/2013 1623   HGBUR NEGATIVE 12/11/2016 2211   BILIRUBINUR NEGATIVE 12/11/2016 2211   BILIRUBINUR Negative 10/09/2013 [place] 12/11/2016 2211   PROTEINUR NEGATIVE 12/11/2016 2211   NITRITE NEGATIVE 12/11/2016 2211   LEUKOCYTESUR NEGATIVE 12/11/2016 2211   LEUKOCYTESUR Negative 10/09/2013 1623     RADIOLOGY: Ct Angio Chest Pe W Or Wo Contrast  Result Date: 12/12/2016 CLINICAL DATA:  Acute onset of upper abdominal pain, nausea, vomiting and diarrhea. Tachycardia and nausea. Initial encounter. EXAM: CT ANGIOGRAPHY CHEST CT ABDOMEN AND PELVIS WITH CONTRAST TECHNIQUE: Multidetector CT imaging of the chest was performed using the standard protocol during bolus administration of intravenous contrast. Multiplanar CT image reconstructions and MIPs were obtained to evaluate the vascular anatomy. Multidetector CT imaging of the abdomen and pelvis was performed using the standard protocol during bolus administration of intravenous contrast. CONTRAST:  100 mL of Isovue 370 IV contrast COMPARISON:  Right upper quadrant ultrasound performed 12/11/2016 FINDINGS: CTA CHEST FINDINGS Cardiovascular: There is no evidence of aortic dissection. There is no evidence of aneurysmal dilatation. Minimal calcification is noted along the left subclavian artery. The heart remains borderline normal in size. There is no evidence of significant pulmonary embolus. Mediastinum/Nodes: The mediastinum is grossly unremarkable in appearance. No mediastinal lymphadenopathy is seen. No pericardial effusion is identified. The visualized portions of the thyroid gland are unremarkable. No axillary lymphadenopathy is seen. Lungs/Pleura: Mild  bilateral dependent subsegmental atelectasis is noted. No pleural effusion or pneumothorax is seen. No masses are identified. Musculoskeletal: No  acute osseous abnormalities are identified. The visualized musculature is unremarkable in appearance. Review of the MIP images confirms the above findings. CT ABDOMEN and PELVIS FINDINGS Hepatobiliary: The liver is unremarkable in appearance. The gallbladder is unremarkable in appearance. The common bile duct remains normal in caliber. Pancreas: The pancreas is within normal limits. Spleen: The spleen is unremarkable in appearance. Adrenals/Urinary Tract: The adrenal glands are unremarkable in appearance. The kidneys are within normal limits. There is no evidence of hydronephrosis. No renal or ureteral stones are identified. No perinephric stranding is seen. Stomach/Bowel: The stomach is unremarkable in appearance. The small bowel is within normal limits. The appendix is normal in caliber, without evidence of appendicitis. The colon is unremarkable in appearance. Vascular/Lymphatic: The abdominal aorta is unremarkable in appearance. The inferior vena cava is grossly unremarkable. No retroperitoneal lymphadenopathy is seen. No pelvic sidewall lymphadenopathy is identified. Reproductive: The bladder is mildly distended and grossly unremarkable. The uterus is unremarkable in appearance. The ovaries are relatively symmetric. No suspicious adnexal masses are seen. Other: Mild nonspecific haziness is noted within the proximal small bowel mesentery. Musculoskeletal: No acute osseous abnormalities are identified. The visualized musculature is unremarkable in appearance. Review of the MIP images confirms the above findings. IMPRESSION: 1. No evidence of significant pulmonary embolus. 2. No evidence of aortic dissection. No evidence of aneurysmal dilatation. 3. Mild bilateral dependent subsegmental atelectasis noted. Lungs otherwise clear. 4. Mild nonspecific haziness within the  proximal small bowel mesentery. This may reflect mesenteritis of uncertain chronicity. Would correlate with the patient's symptoms. Electronically Signed   By: Garald Balding M.D.   On: 12/12/2016 01:58   Ct Abdomen Pelvis W Contrast  Result Date: 12/12/2016 CLINICAL DATA:  Acute onset of upper abdominal pain, nausea, vomiting and diarrhea. Tachycardia and nausea. Initial encounter. EXAM: CT ANGIOGRAPHY CHEST CT ABDOMEN AND PELVIS WITH CONTRAST TECHNIQUE: Multidetector CT imaging of the chest was performed using the standard protocol during bolus administration of intravenous contrast. Multiplanar CT image reconstructions and MIPs were obtained to evaluate the vascular anatomy. Multidetector CT imaging of the abdomen and pelvis was performed using the standard protocol during bolus administration of intravenous contrast. CONTRAST:  100 mL of Isovue 370 IV contrast COMPARISON:  Right upper quadrant ultrasound performed 12/11/2016 FINDINGS: CTA CHEST FINDINGS Cardiovascular: There is no evidence of aortic dissection. There is no evidence of aneurysmal dilatation. Minimal calcification is noted along the left subclavian artery. The heart remains borderline normal in size. There is no evidence of significant pulmonary embolus. Mediastinum/Nodes: The mediastinum is grossly unremarkable in appearance. No mediastinal lymphadenopathy is seen. No pericardial effusion is identified. The visualized portions of the thyroid gland are unremarkable. No axillary lymphadenopathy is seen. Lungs/Pleura: Mild bilateral dependent subsegmental atelectasis is noted. No pleural effusion or pneumothorax is seen. No masses are identified. Musculoskeletal: No acute osseous abnormalities are identified. The visualized musculature is unremarkable in appearance. Review of the MIP images confirms the above findings. CT ABDOMEN and PELVIS FINDINGS Hepatobiliary: The liver is unremarkable in appearance. The gallbladder is unremarkable in  appearance. The common bile duct remains normal in caliber. Pancreas: The pancreas is within normal limits. Spleen: The spleen is unremarkable in appearance. Adrenals/Urinary Tract: The adrenal glands are unremarkable in appearance. The kidneys are within normal limits. There is no evidence of hydronephrosis. No renal or ureteral stones are identified. No perinephric stranding is seen. Stomach/Bowel: The stomach is unremarkable in appearance. The small bowel is within normal limits. The appendix is normal in caliber, without evidence of  appendicitis. The colon is unremarkable in appearance. Vascular/Lymphatic: The abdominal aorta is unremarkable in appearance. The inferior vena cava is grossly unremarkable. No retroperitoneal lymphadenopathy is seen. No pelvic sidewall lymphadenopathy is identified. Reproductive: The bladder is mildly distended and grossly unremarkable. The uterus is unremarkable in appearance. The ovaries are relatively symmetric. No suspicious adnexal masses are seen. Other: Mild nonspecific haziness is noted within the proximal small bowel mesentery. Musculoskeletal: No acute osseous abnormalities are identified. The visualized musculature is unremarkable in appearance. Review of the MIP images confirms the above findings. IMPRESSION: 1. No evidence of significant pulmonary embolus. 2. No evidence of aortic dissection. No evidence of aneurysmal dilatation. 3. Mild bilateral dependent subsegmental atelectasis noted. Lungs otherwise clear. 4. Mild nonspecific haziness within the proximal small bowel mesentery. This may reflect mesenteritis of uncertain chronicity. Would correlate with the patient's symptoms. Electronically Signed   By: Garald Balding M.D.   On: 12/12/2016 01:58   US Abdomen Limited Ruq  Result Date: 12/11/2016 CLINICAL DATA:  Acute onset of severe epigastric and right upper quadrant abdominal pain. Nausea, loose stools and tachycardia. Initial encounter. EXAM: ULTRASOUND  ABDOMEN LIMITED RIGHT UPPER QUADRANT COMPARISON:  None. FINDINGS: Gallbladder: No gallstones or wall thickening visualized. No sonographic Murphy sign noted by sonographer. Common bile duct: Diameter: 0.5 cm, within normal limits in caliber. Liver: No focal lesion identified. Diffuse increased parenchymal echogenicity and coarsened echotexture likely reflects fatty infiltration. IMPRESSION: 1. No acute abnormality at the right upper quadrant. 2. Diffuse fatty infiltration within the liver. Electronically Signed   By: Garald Balding M.D.   On: 12/11/2016 23:44    EKG: Orders placed or performed during the hospital encounter of 12/11/16  . ED EKG  . ED EKG    IMPRESSION AND PLAN: 51 year old female patient with history of hypertension presented to the emergency room with abdominal discomfort and tachycardia. Admitting diagnosis 1. Tachyarrhythmia 2. Abnormal liver function tests 3. Abdominal pain 4. Hypertension Treatment plan Admit patient to observation bed IV fluid hydration Oral metoprolol for hypertension and takes [place] Follow-up liver function tests Pain management with IV morphine DVT prophylaxis subcutaneous Lovenox 40 MG daily Supportive care All the records are reviewed and case discussed with ED provider. Management plans discussed with the patient, family and they are in agreement.  CODE STATUS:FULL CODE Code Status History    This patient does not have a recorded code status. Please follow your organizational policy for patients in this situation.       TOTAL TIME TAKING CARE OF THIS PATIENT: 50 minutes.    Saundra Shelling M.D on 12/12/2016 at 4:41 AM  Between 7am to 6pm - Pager - [phone]  After 6pm go to [email or web address] - password [email or web address] [facility]  Office  [phone]  CC: Primary care physician; System, Pcp Not In

## 2016-12-12 NOTE — Progress Notes (Signed)
Heart rate is in the 130's, notified Dr.Pyreddy. Received orders for iv metoprolol.

## 2016-12-12 NOTE — Progress Notes (Signed)
Lovenox changed to 40 mg BID for BMI >40 and CrCl >30. 

## 2016-12-12 NOTE — ED Provider Notes (Signed)
-----------------------------------------   1:06 AM on 12/12/2016 -----------------------------------------  Low-dose Lopressor ordered for hypertension. Patient remains tachycardic after 1L of fluids. Awaiting CT scan.   ----------------------------------------- 2:09 AM on 12/12/2016 -----------------------------------------  CT chest, abdomen/pelvis interpreted per Dr. Radene Knee: 1. No evidence of significant pulmonary embolus.  2. No evidence of aortic dissection. No evidence of aneurysmal  dilatation.  3. Mild bilateral dependent subsegmental atelectasis noted. Lungs  otherwise clear.  4. Mild nonspecific haziness within the proximal small bowel  mesentery. This may reflect mesenteritis of uncertain chronicity.  Would correlate with the patient's symptoms.   Patient resting in no acute distress. States overall her abdominal pain is much improved. Currently complains of headache. Blood pressure improved. Heart rate still sinus tachycardia in the 140s after 2 L IV fluid. Will order additional third liter, check thyroid panel. Will discuss with hospitalist to evaluate patient in the emergency department for admission.   Paulette Blanch, MD 12/12/16 [phone]

## 2016-12-12 NOTE — ED Notes (Signed)
Patient transported to CT 

## 2016-12-12 NOTE — Care Management (Signed)
No discharge needs identified by members of the care team 

## 2016-12-12 NOTE — Progress Notes (Signed)
   12/12/16 1250  Clinical Encounter Type  Visited With Patient  Visit Type Initial;Spiritual support  Referral From Nurse  Consult/Referral To Chaplain  Spiritual Encounters  Spiritual Needs Literature;Prayer;Emotional  Stress Factors  Patient Stress Factors Health changes  Family Stress Factors None identified  Advance Directives (For Healthcare)  Does Patient Have a Medical Advance Directive? No  Would patient like information on creating a medical advance directive? Yes (Inpatient - patient requests chaplain consult to create a medical advance directive)   Chaplain responded to request for patient education about an Forensic scientist. Chaplain provided a copy of the directive with the patient. Patient stated she would contact Chaplain's office. Patient would like to discuss with spouse before signing. Chaplain's office will follow up as needed.  Valentina Lucks, Chaplain

## 2016-12-12 NOTE — Progress Notes (Signed)
Patient admitted on 6/12 with complaints of a headache. Patient has had a recent attempt of suicide on her birthday. Patient states "i do not have a current plan." Patient is currently in therapy. Spouse is at bedside. Will continue to monitor and assess.

## 2016-12-13 ENCOUNTER — Inpatient Hospital Stay
Admit: 2016-12-13 | Discharge: 2016-12-13 | Disposition: A | Discharge status: Home / Self Care | Payer: BLUE CROSS/BLUE SHIELD | Attending: Internal Medicine | Admitting: Internal Medicine

## 2016-12-13 ENCOUNTER — Inpatient Hospital Stay: Payer: BLUE CROSS/BLUE SHIELD

## 2016-12-13 DIAGNOSIS — R945 Abnormal results of liver function studies: Secondary | ICD-10-CM | POA: Diagnosis present

## 2016-12-13 DIAGNOSIS — R0602 Shortness of breath: Secondary | ICD-10-CM | POA: Diagnosis present

## 2016-12-13 DIAGNOSIS — E860 Dehydration: Secondary | ICD-10-CM | POA: Diagnosis present

## 2016-12-13 DIAGNOSIS — R1010 Upper abdominal pain, unspecified: Secondary | ICD-10-CM | POA: Diagnosis present

## 2016-12-13 DIAGNOSIS — I471 Supraventricular tachycardia: Secondary | ICD-10-CM | POA: Diagnosis present

## 2016-12-13 DIAGNOSIS — R51 Headache: Secondary | ICD-10-CM | POA: Diagnosis present

## 2016-12-13 DIAGNOSIS — F329 Major depressive disorder, single episode, unspecified: Secondary | ICD-10-CM | POA: Diagnosis present

## 2016-12-13 DIAGNOSIS — I10 Essential (primary) hypertension: Secondary | ICD-10-CM | POA: Diagnosis present

## 2016-12-13 DIAGNOSIS — Z79899 Other long term (current) drug therapy: Secondary | ICD-10-CM | POA: Diagnosis not present

## 2016-12-13 LAB — TSH: TSH: 0.896 u[IU]/mL (ref 0.350–4.500)

## 2016-12-13 MED ORDER — FUROSEMIDE 10 MG/ML IJ SOLN
20.0000 mg | Freq: Once | INTRAMUSCULAR | Status: AC
  Administered 2016-12-13: 20 mg via INTRAVENOUS
  Filled 2016-12-13: qty 2

## 2016-12-13 MED ORDER — METOPROLOL SUCCINATE ER 50 MG PO TB24
50.0000 mg | ORAL_TABLET | Freq: Two times a day (BID) | ORAL | Status: DC
  Administered 2016-12-13 – 2016-12-14 (×3): 50 mg via ORAL
  Filled 2016-12-13 (×3): qty 1

## 2016-12-13 NOTE — Progress Notes (Addendum)
HR remains elevated in 130s- run of SVT/ pt asymptomatic / MD made aware/ cardio consult ordered

## 2016-12-13 NOTE — Consult Note (Signed)
[facility] Consultation Note  Patient ID: Jennifer Newman, MRN: [id number], DOB/AGE: 1966-02-25 51 y.o. Admit date: 12/11/2016   Date of Consult: 12/13/2016 Primary Physician: System, Pcp Not In Primary Cardiologist:None  Chief Complaint:  Chief Complaint  Patient presents with  . Abdominal Pain   Reason for Consult: tachycardia  HPI: 51 y.o. female with the known history of the essential hypertension but no other cardiovascular disease who is done fairly well with some appropriate medication management and has had a 4-5 days of anorexia abdominal pain and vomiting and diarrhea. This has progressed to the point where the patient is incredibly weak and unable to do anything whatsoever. Lying in the bed she is weak. There is been no evidence of chest discomfort or congestive heart failure symptoms with a chest x-ray which no evidence of pulmonary edema and a CT scan without evidence of pulmonary embolism. The patient does not have any significant new lower extremity edema or significant hepatosplenomegaly. The patient has had no evidence of myocardial infarction with a normal troponin but does have the atrial tachycardia at 150 bpm with left atrial enlargement which has been helped by her Toprol at 50 mg for now heart rate of 120 230 bpm. There has been what appears to be significant tachycardia due to weakness and possible dehydration which is slowly improving. There is no current primary cardiac involvement at this time  Past Medical History:  Diagnosis Date  . Hypertension       Surgical History:  Past Surgical History:  Procedure Laterality Date  . none       Home Meds: Prior to Admission medications   Medication Sig Start Date End Date Taking? Authorizing Provider  DULoxetine (CYMBALTA) 30 MG capsule Take 1 capsule by mouth daily. 12/04/16  Yes [provider]  DULoxetine (CYMBALTA) 60 MG capsule Take 1 capsule by mouth daily. 12/04/16  Yes [provider]  furosemide (LASIX) 40 MG tablet Take 1 tablet by mouth daily. 08/08/16 04/22/17 Yes [provider]  hydrochlorothiazide (HYDRODIURIL) 12.5 MG tablet Take 1 tablet by mouth daily. 08/08/16 08/08/17 Yes [provider]  loratadine (CLARITIN) 10 MG tablet Take 1 tablet by mouth daily. 03/03/14  Yes [provider]  metoprolol succinate (TOPROL-XL) 25 MG 24 hr tablet Take 1 tablet by mouth 2 (two) times daily. 08/08/16 08/08/17 Yes [provider]    Inpatient Medications:  . DULoxetine  90 mg Oral Daily  . enoxaparin (LOVENOX) injection  40 mg Subcutaneous BID  . metoprolol succinate  50 mg Oral BID   . sodium chloride 125 mL/hr at 12/13/16 0347    Allergies: No Known Allergies  Social History   Social History  . Marital status: Married    Spouse name: N/A  . Number of children: N/A  . Years of education: N/A   Occupational History  . customer service representative at [employer]    Social History Main Topics  . Smoking status: Never Smoker  . Smokeless tobacco: Never Used  . Alcohol use Yes     Comment: occasionally  . Drug use: No  . Sexual activity: Yes   Other Topics Concern  . Not on file   Social History Narrative  . No narrative on file     Family History  Problem Relation Age of Onset  . COPD Neg Hx   . Diabetes Neg Hx   . Hypertension Neg Hx   . CAD Neg Hx  Review of Systems Positive forAbdominal pain nausea vomiting Negative for: General:  chills, fever, night sweats or weight changes.  Cardiovascular: PND orthopnea syncope dizziness  Dermatological skin lesions rashes Respiratory: Cough congestion Urologic: Frequent urination urination at night and hematuria Abdominal: Positive for for nausea, vomiting, diarrhea, negative for bright red blood per rectum, melena, or hematemesis Neurologic: negative for visual changes, and/or hearing changes  All other systems reviewed and are otherwise negative except as noted  above.  Labs:  Recent Labs  12/11/16 2211  TROPONINI <0.03   Lab Results  Component Value Date   WBC 7.3 12/12/2016   HGB 12.7 12/12/2016   HCT 37.7 12/12/2016   MCV 87.3 12/12/2016   PLT 209 12/12/2016    Recent Labs Lab 12/11/16 2211 12/12/16 0625  NA 136 139  K 4.0 3.6  CL 101 105  CO2 24 27  BUN 17 12  CREATININE 0.64 0.70  CALCIUM 9.3 8.0*  PROT 8.7*  --   BILITOT 0.9  --   ALKPHOS 197*  --   ALT 57*  --   AST 64*  --   GLUCOSE 140* 122*   No results found for: CHOL, HDL, LDLCALC, TRIG No results found for: DDIMER  Radiology/Studies:  Ct Angio Chest Pe W Or Wo Contrast  Result Date: 12/12/2016 CLINICAL DATA:  Acute onset of upper abdominal pain, nausea, vomiting and diarrhea. Tachycardia and nausea. Initial encounter. EXAM: CT ANGIOGRAPHY CHEST CT ABDOMEN AND PELVIS WITH CONTRAST TECHNIQUE: Multidetector CT imaging of the chest was performed using the standard protocol during bolus administration of intravenous contrast. Multiplanar CT image reconstructions and MIPs were obtained to evaluate the vascular anatomy. Multidetector CT imaging of the abdomen and pelvis was performed using the standard protocol during bolus administration of intravenous contrast. CONTRAST:  100 mL of Isovue 370 IV contrast COMPARISON:  Right upper quadrant ultrasound performed 12/11/2016 FINDINGS: CTA CHEST FINDINGS Cardiovascular: There is no evidence of aortic dissection. There is no evidence of aneurysmal dilatation. Minimal calcification is noted along the left subclavian artery. The heart remains borderline normal in size. There is no evidence of significant pulmonary embolus. Mediastinum/Nodes: The mediastinum is grossly unremarkable in appearance. No mediastinal lymphadenopathy is seen. No pericardial effusion is identified. The visualized portions of the thyroid gland are unremarkable. No axillary lymphadenopathy is seen. Lungs/Pleura: Mild bilateral dependent subsegmental atelectasis  is noted. No pleural effusion or pneumothorax is seen. No masses are identified. Musculoskeletal: No acute osseous abnormalities are identified. The visualized musculature is unremarkable in appearance. Review of the MIP images confirms the above findings. CT ABDOMEN and PELVIS FINDINGS Hepatobiliary: The liver is unremarkable in appearance. The gallbladder is unremarkable in appearance. The common bile duct remains normal in caliber. Pancreas: The pancreas is within normal limits. Spleen: The spleen is unremarkable in appearance. Adrenals/Urinary Tract: The adrenal glands are unremarkable in appearance. The kidneys are within normal limits. There is no evidence of hydronephrosis. No renal or ureteral stones are identified. No perinephric stranding is seen. Stomach/Bowel: The stomach is unremarkable in appearance. The small bowel is within normal limits. The appendix is normal in caliber, without evidence of appendicitis. The colon is unremarkable in appearance. Vascular/Lymphatic: The abdominal aorta is unremarkable in appearance. The inferior vena cava is grossly unremarkable. No retroperitoneal lymphadenopathy is seen. No pelvic sidewall lymphadenopathy is identified. Reproductive: The bladder is mildly distended and grossly unremarkable. The uterus is unremarkable in appearance. The ovaries are relatively symmetric. No suspicious adnexal masses are seen. Other: Mild nonspecific haziness  is noted within the proximal small bowel mesentery. Musculoskeletal: No acute osseous abnormalities are identified. The visualized musculature is unremarkable in appearance. Review of the MIP images confirms the above findings. IMPRESSION: 1. No evidence of significant pulmonary embolus. 2. No evidence of aortic dissection. No evidence of aneurysmal dilatation. 3. Mild bilateral dependent subsegmental atelectasis noted. Lungs otherwise clear. 4. Mild nonspecific haziness within the proximal small bowel mesentery. This may reflect  mesenteritis of uncertain chronicity. Would correlate with the patient's symptoms. Electronically Signed   By: Garald Balding M.D.   On: 12/12/2016 01:58   Ct Abdomen Pelvis W Contrast  Result Date: 12/12/2016 CLINICAL DATA:  Acute onset of upper abdominal pain, nausea, vomiting and diarrhea. Tachycardia and nausea. Initial encounter. EXAM: CT ANGIOGRAPHY CHEST CT ABDOMEN AND PELVIS WITH CONTRAST TECHNIQUE: Multidetector CT imaging of the chest was performed using the standard protocol during bolus administration of intravenous contrast. Multiplanar CT image reconstructions and MIPs were obtained to evaluate the vascular anatomy. Multidetector CT imaging of the abdomen and pelvis was performed using the standard protocol during bolus administration of intravenous contrast. CONTRAST:  100 mL of Isovue 370 IV contrast COMPARISON:  Right upper quadrant ultrasound performed 12/11/2016 FINDINGS: CTA CHEST FINDINGS Cardiovascular: There is no evidence of aortic dissection. There is no evidence of aneurysmal dilatation. Minimal calcification is noted along the left subclavian artery. The heart remains borderline normal in size. There is no evidence of significant pulmonary embolus. Mediastinum/Nodes: The mediastinum is grossly unremarkable in appearance. No mediastinal lymphadenopathy is seen. No pericardial effusion is identified. The visualized portions of the thyroid gland are unremarkable. No axillary lymphadenopathy is seen. Lungs/Pleura: Mild bilateral dependent subsegmental atelectasis is noted. No pleural effusion or pneumothorax is seen. No masses are identified. Musculoskeletal: No acute osseous abnormalities are identified. The visualized musculature is unremarkable in appearance. Review of the MIP images confirms the above findings. CT ABDOMEN and PELVIS FINDINGS Hepatobiliary: The liver is unremarkable in appearance. The gallbladder is unremarkable in appearance. The common bile duct remains normal in  caliber. Pancreas: The pancreas is within normal limits. Spleen: The spleen is unremarkable in appearance. Adrenals/Urinary Tract: The adrenal glands are unremarkable in appearance. The kidneys are within normal limits. There is no evidence of hydronephrosis. No renal or ureteral stones are identified. No perinephric stranding is seen. Stomach/Bowel: The stomach is unremarkable in appearance. The small bowel is within normal limits. The appendix is normal in caliber, without evidence of appendicitis. The colon is unremarkable in appearance. Vascular/Lymphatic: The abdominal aorta is unremarkable in appearance. The inferior vena cava is grossly unremarkable. No retroperitoneal lymphadenopathy is seen. No pelvic sidewall lymphadenopathy is identified. Reproductive: The bladder is mildly distended and grossly unremarkable. The uterus is unremarkable in appearance. The ovaries are relatively symmetric. No suspicious adnexal masses are seen. Other: Mild nonspecific haziness is noted within the proximal small bowel mesentery. Musculoskeletal: No acute osseous abnormalities are identified. The visualized musculature is unremarkable in appearance. Review of the MIP images confirms the above findings. IMPRESSION: 1. No evidence of significant pulmonary embolus. 2. No evidence of aortic dissection. No evidence of aneurysmal dilatation. 3. Mild bilateral dependent subsegmental atelectasis noted. Lungs otherwise clear. 4. Mild nonspecific haziness within the proximal small bowel mesentery. This may reflect mesenteritis of uncertain chronicity. Would correlate with the patient's symptoms. Electronically Signed   By: Garald Balding M.D.   On: 12/12/2016 01:58   US Abdomen Limited Ruq  Result Date: 12/11/2016 CLINICAL DATA:  Acute onset of severe epigastric and right  upper quadrant abdominal pain. Nausea, loose stools and tachycardia. Initial encounter. EXAM: ULTRASOUND ABDOMEN LIMITED RIGHT UPPER QUADRANT COMPARISON:  None.  FINDINGS: Gallbladder: No gallstones or wall thickening visualized. No sonographic Murphy sign noted by sonographer. Common bile duct: Diameter: 0.5 cm, within normal limits in caliber. Liver: No focal lesion identified. Diffuse increased parenchymal echogenicity and coarsened echotexture likely reflects fatty infiltration. IMPRESSION: 1. No acute abnormality at the right upper quadrant. 2. Diffuse fatty infiltration within the liver. Electronically Signed   By: Garald Balding M.D.   On: 12/11/2016 23:44    EKG: Atrial tachycardia with 150 bpm  Weights: Filed Weights   12/11/16 2209 12/12/16 0600  Weight: 90.7 kg (200 lb) 103.4 kg (227 lb 14.4 oz)     Physical Exam: Blood pressure (!) 142/82, pulse (!) 138, temperature 98.1 F (36.7 C), temperature source Oral, resp. rate 20, height 5\' 3"  (1.6 m), weight 103.4 kg (227 lb 14.4 oz), SpO2 94 %. Body mass index is 40.37 kg/m. General: Well developed, well nourished, in no acute distress. Head eyes ears nose throat: Normocephalic, atraumatic, sclera non-icteric, no xanthomas, nares are without discharge. No apparent thyromegaly and/or mass  Lungs: Normal respiratory effort.  no wheezes, no rales, no rhonchi.  Heart:Tachycardic with normal S1 S2. no murmur gallop, no rub, PMI is normal size and placement, carotid upstroke normal without bruit, jugular venous pressure is normal Abdomen: Soft,  tender,  distended with normoactive bowel sounds. No hepatomegaly. No rebound/guarding. No obvious abdominal masses. Abdominal aorta is normal size without bruit Extremities:Trace edema. no cyanosis, no clubbing, no ulcers  Peripheral : 2+ bilateral upper extremity pulses, 2+ bilateral femoral pulses, 2+ bilateral dorsal pedal pulse Neuro: Alert and oriented. No facial asymmetry. No focal deficit. Moves all extremities spontaneously. Musculoskeletal: Normal muscle tone without kyphosis Psych:  Responds to questions appropriately with a normal affect.     Assessment: 51 year old female with essential hypertension having significant abdominal discomfort weakness and fatigue without evidence of myocardial infarction or congestive heart failure at this time with tachycardia most likely secondary to her current medical condition  Plan: 1. Continue metoprolol at current dose for heart rate control of less than 120 bpm 2. Hydration and further treatment of the nausea vomiting and diarrhea 3. Further investigation of abdominal discomfort as primary cause of above 4. Echocardiogram for LV systolic dysfunction valvular heart disease potentially contributing to above with further treatment options  Signed, Corey Skains M.D. [facility] 12/13/2016, 8:49 AM

## 2016-12-13 NOTE — Progress Notes (Signed)
[facility] NAME: Jennifer Newman    MR#:  [id number]  DATE OF BIRTH:  1966-01-31  SUBJECTIVE:   Patient has headache this morning. Telemetry monitoring shows fluctuations in heart rate. Patient is complaining of shortness of breath and palpitations at times.  REVIEW OF SYSTEMS:    Review of Systems  Constitutional: Negative for fever, chills weight loss Feels weak and tired today HENT: Negative for ear pain, nosebleeds, congestion, facial swelling, rhinorrhea, neck pain, neck stiffness and ear discharge.   Respiratory: Negative for cough, , wheezing positive for shortness of breath Cardiovascular: Negative for chest pain,++ palpitations and no leg swelling.  Gastrointestinal: Negative for heartburn, abdominal pain, vomiting, diarrhea or consitpation Genitourinary: Negative for dysuria, urgency, frequency, hematuria Musculoskeletal: Negative for back pain or joint pain Neurological: Negative for dizziness, seizures, syncope, focal weakness,  numbness and headaches.  Hematological: Does not bruise/bleed easily.  Psychiatric/Behavioral: Negative for hallucinations, confusion, dysphoric mood    Tolerating Diet:yes      DRUG ALLERGIES:  No Known Allergies  VITALS:  Blood pressure (!) 142/82, pulse (!) 138, temperature 98.1 F (36.7 C), temperature source Oral, resp. rate 20, height 5\' 3"  (1.6 m), weight 103.4 kg (227 lb 14.4 oz), SpO2 94 %.  PHYSICAL EXAMINATION:  Constitutional: Obese not in distress HENT: Normocephalic. Marland Kitchen Oropharynx is clear and moist.  Eyes: Conjunctivae and EOM are normal. PERRLA, no scleral icterus.  Neck: Normal ROM. Neck supple. No JVD. No tracheal deviation. CVS: Tachycardic, S1/S2 +, no murmurs, no gallops, no carotid bruit.  Pulmonary: Effort and breath sounds normal, no stridor, rhonchi, wheezes, rales.  Abdominal: Soft. BS +,  no distension, tenderness, rebound or guarding.  Musculoskeletal:  Normal range of motion. No edema and no tenderness.  Neuro: Alert. CN 2-12 grossly intact. No focal deficits. Skin: Skin is warm and dry. No rash noted. Psychiatric: Normal mood and affect.      LABORATORY PANEL:   CBC  Recent Labs Lab 12/12/16 0625  WBC 7.3  HGB 12.7  HCT 37.7  PLT 209   ------------------------------------------------------------------------------------------------------------------  Chemistries   Recent Labs Lab 12/11/16 2211 12/12/16 0625  NA 136 139  K 4.0 3.6  CL 101 105  CO2 24 27  GLUCOSE 140* 122*  BUN 17 12  CREATININE 0.64 0.70  CALCIUM 9.3 8.0*  AST 64*  --   ALT 57*  --   ALKPHOS 197*  --   BILITOT 0.9  --    ------------------------------------------------------------------------------------------------------------------  Cardiac Enzymes  Recent Labs Lab 12/11/16 2211  TROPONINI <0.03   ------------------------------------------------------------------------------------------------------------------  RADIOLOGY:  Ct Angio Chest Pe W Or Wo Contrast  Result Date: 12/12/2016 CLINICAL DATA:  Acute onset of upper abdominal pain, nausea, vomiting and diarrhea. Tachycardia and nausea. Initial encounter. EXAM: CT ANGIOGRAPHY CHEST CT ABDOMEN AND PELVIS WITH CONTRAST TECHNIQUE: Multidetector CT imaging of the chest was performed using the standard protocol during bolus administration of intravenous contrast. Multiplanar CT image reconstructions and MIPs were obtained to evaluate the vascular anatomy. Multidetector CT imaging of the abdomen and pelvis was performed using the standard protocol during bolus administration of intravenous contrast. CONTRAST:  100 mL of Isovue 370 IV contrast COMPARISON:  Right upper quadrant ultrasound performed 12/11/2016 FINDINGS: CTA CHEST FINDINGS Cardiovascular: There is no evidence of aortic dissection. There is no evidence of aneurysmal dilatation. Minimal calcification is noted along the left subclavian  artery. The heart remains borderline normal in size. There is no evidence of significant  pulmonary embolus. Mediastinum/Nodes: The mediastinum is grossly unremarkable in appearance. No mediastinal lymphadenopathy is seen. No pericardial effusion is identified. The visualized portions of the thyroid gland are unremarkable. No axillary lymphadenopathy is seen. Lungs/Pleura: Mild bilateral dependent subsegmental atelectasis is noted. No pleural effusion or pneumothorax is seen. No masses are identified. Musculoskeletal: No acute osseous abnormalities are identified. The visualized musculature is unremarkable in appearance. Review of the MIP images confirms the above findings. CT ABDOMEN and PELVIS FINDINGS Hepatobiliary: The liver is unremarkable in appearance. The gallbladder is unremarkable in appearance. The common bile duct remains normal in caliber. Pancreas: The pancreas is within normal limits. Spleen: The spleen is unremarkable in appearance. Adrenals/Urinary Tract: The adrenal glands are unremarkable in appearance. The kidneys are within normal limits. There is no evidence of hydronephrosis. No renal or ureteral stones are identified. No perinephric stranding is seen. Stomach/Bowel: The stomach is unremarkable in appearance. The small bowel is within normal limits. The appendix is normal in caliber, without evidence of appendicitis. The colon is unremarkable in appearance. Vascular/Lymphatic: The abdominal aorta is unremarkable in appearance. The inferior vena cava is grossly unremarkable. No retroperitoneal lymphadenopathy is seen. No pelvic sidewall lymphadenopathy is identified. Reproductive: The bladder is mildly distended and grossly unremarkable. The uterus is unremarkable in appearance. The ovaries are relatively symmetric. No suspicious adnexal masses are seen. Other: Mild nonspecific haziness is noted within the proximal small bowel mesentery. Musculoskeletal: No acute osseous abnormalities are  identified. The visualized musculature is unremarkable in appearance. Review of the MIP images confirms the above findings. IMPRESSION: 1. No evidence of significant pulmonary embolus. 2. No evidence of aortic dissection. No evidence of aneurysmal dilatation. 3. Mild bilateral dependent subsegmental atelectasis noted. Lungs otherwise clear. 4. Mild nonspecific haziness within the proximal small bowel mesentery. This may reflect mesenteritis of uncertain chronicity. Would correlate with the patient's symptoms. Electronically Signed   By: Garald Balding Newman.   On: 12/12/2016 01:58   Ct Abdomen Pelvis W Contrast  Result Date: 12/12/2016 CLINICAL DATA:  Acute onset of upper abdominal pain, nausea, vomiting and diarrhea. Tachycardia and nausea. Initial encounter. EXAM: CT ANGIOGRAPHY CHEST CT ABDOMEN AND PELVIS WITH CONTRAST TECHNIQUE: Multidetector CT imaging of the chest was performed using the standard protocol during bolus administration of intravenous contrast. Multiplanar CT image reconstructions and MIPs were obtained to evaluate the vascular anatomy. Multidetector CT imaging of the abdomen and pelvis was performed using the standard protocol during bolus administration of intravenous contrast. CONTRAST:  100 mL of Isovue 370 IV contrast COMPARISON:  Right upper quadrant ultrasound performed 12/11/2016 FINDINGS: CTA CHEST FINDINGS Cardiovascular: There is no evidence of aortic dissection. There is no evidence of aneurysmal dilatation. Minimal calcification is noted along the left subclavian artery. The heart remains borderline normal in size. There is no evidence of significant pulmonary embolus. Mediastinum/Nodes: The mediastinum is grossly unremarkable in appearance. No mediastinal lymphadenopathy is seen. No pericardial effusion is identified. The visualized portions of the thyroid gland are unremarkable. No axillary lymphadenopathy is seen. Lungs/Pleura: Mild bilateral dependent subsegmental atelectasis is  noted. No pleural effusion or pneumothorax is seen. No masses are identified. Musculoskeletal: No acute osseous abnormalities are identified. The visualized musculature is unremarkable in appearance. Review of the MIP images confirms the above findings. CT ABDOMEN and PELVIS FINDINGS Hepatobiliary: The liver is unremarkable in appearance. The gallbladder is unremarkable in appearance. The common bile duct remains normal in caliber. Pancreas: The pancreas is within normal limits. Spleen: The spleen is unremarkable in appearance. Adrenals/Urinary  Tract: The adrenal glands are unremarkable in appearance. The kidneys are within normal limits. There is no evidence of hydronephrosis. No renal or ureteral stones are identified. No perinephric stranding is seen. Stomach/Bowel: The stomach is unremarkable in appearance. The small bowel is within normal limits. The appendix is normal in caliber, without evidence of appendicitis. The colon is unremarkable in appearance. Vascular/Lymphatic: The abdominal aorta is unremarkable in appearance. The inferior vena cava is grossly unremarkable. No retroperitoneal lymphadenopathy is seen. No pelvic sidewall lymphadenopathy is identified. Reproductive: The bladder is mildly distended and grossly unremarkable. The uterus is unremarkable in appearance. The ovaries are relatively symmetric. No suspicious adnexal masses are seen. Other: Mild nonspecific haziness is noted within the proximal small bowel mesentery. Musculoskeletal: No acute osseous abnormalities are identified. The visualized musculature is unremarkable in appearance. Review of the MIP images confirms the above findings. IMPRESSION: 1. No evidence of significant pulmonary embolus. 2. No evidence of aortic dissection. No evidence of aneurysmal dilatation. 3. Mild bilateral dependent subsegmental atelectasis noted. Lungs otherwise clear. 4. Mild nonspecific haziness within the proximal small bowel mesentery. This may reflect  mesenteritis of uncertain chronicity. Would correlate with the patient's symptoms. Electronically Signed   By: Garald Balding Newman.   On: 12/12/2016 01:58   US Abdomen Limited Ruq  Result Date: 12/11/2016 CLINICAL DATA:  Acute onset of severe epigastric and right upper quadrant abdominal pain. Nausea, loose stools and tachycardia. Initial encounter. EXAM: ULTRASOUND ABDOMEN LIMITED RIGHT UPPER QUADRANT COMPARISON:  None. FINDINGS: Gallbladder: No gallstones or wall thickening visualized. No sonographic Murphy sign noted by sonographer. Common bile duct: Diameter: 0.5 cm, within normal limits in caliber. Liver: No focal lesion identified. Diffuse increased parenchymal echogenicity and coarsened echotexture likely reflects fatty infiltration. IMPRESSION: 1. No acute abnormality at the right upper quadrant. 2. Diffuse fatty infiltration within the liver. Electronically Signed   By: Garald Balding Newman.   On: 12/11/2016 23:44     ASSESSMENT AND PLAN:   51 year old female with history of hypertension who presents with abdominal pain and found to have sinus tachycardia.  1. Sinus tachycardia: Patient continues to have tachycardia on metoprolol Order echocardiogram Cardiology consult Check TSH Increase metoprolol CT chest negative for PE  2. Abdominal pain: Patient no longer has abdominal pain. Patient is tolerating diet. CT scan with results as above Right upper quadrant ultrasound shows diffuse fatty infiltration  3. Depression: Continue Cymbalta  4. Essential hypertension: Continue metoprolol and HCTZ   5. Headache: Check CT head   Management plans discussed with the patient and she is in agreement.  CODE STATUS: full  TOTAL TIME TAKING CARE OF THIS PATIENT: 30 minutes.     POSSIBLE D/C tomorrow, DEPENDING ON CLINICAL CONDITION.   Mileigh Tilley Newman on 12/13/2016 at 8:12 AM  Between 7am to 6pm - Pager - [phone] After 6pm go to [email or web address] - password [email or web address] [facility]  Office  [phone]  CC: Primary care physician; System, Pcp Not In  Note: This dictation was prepared with Dragon dictation along with smaller phrase technology. Any transcriptional errors that result from this process are unintentional.

## 2016-12-13 NOTE — Evaluation (Signed)
Physical Therapy Evaluation Patient Details Name: Jennifer Newman MRN: [id number] DOB: Sep 02, 1965 Today's Date: 12/13/2016   History of Present Illness  Pt admitted with complaints of abdominal pain, vomiting, and tachycardia. She has been evaluated by cardiology. Per RN her beta blocker was increased and her HR has been under better control. Upon arrival HR is in the 80's as pt is resting comfortably in bed  Clinical Impression  Pt demonstrates independent with bed mobility, transfers, and ambulation. Upon arrival pt is resting comfortably in bed and HR is in the 80's bpm. With bed mobility it increases to 130 and takes 20-30 seconds to decrease back to 80's. The same occurs initially with ambulation to RN station. Again she requires 20-30 seconds for her HR to decrease from the 130's to 80's. She is able to proceed and ambulate a full lap around RN station. For rest of ambulation HR remains in the 80's bpm. Denies DOE. Speed and stability WNL. No safety or balance deficits identified. Good strength. No PT needs. Will complete order. Please enter new order if status or needs change.      Follow Up Recommendations No PT follow up    Equipment Recommendations  None recommended by PT    Recommendations for Other Services       Precautions / Restrictions Precautions Precautions: None Restrictions Weight Bearing Restrictions: No      Mobility  Bed Mobility Overal bed mobility: Independent             General bed mobility comments: No deficits, good speed/sequencing. HR increases to 130 bpm with bed mobility  Transfers Overall transfer level: Independent Equipment used: None             General transfer comment: Good speed, sequencing, and stability. No deficits  Ambulation/Gait Ambulation/Gait assistance: Independent Ambulation Distance (Feet): 220 Feet Assistive device: None Gait Pattern/deviations: WFL(Within Functional Limits) Gait velocity: WFL for community  ambulation   General Gait Details: Pt able to ambulate a full lap around RN station. From bed to RN station HR increases to 130 bpm. With standing rest break it decreases back to 80's within 30 seconds. For rest of ambulation HR remains in the 80's bpm. Denies DOE. Speed and stability WNL  Stairs            Wheelchair Mobility    Modified Rankin (Stroke Patients Only)       Balance Overall balance assessment: Needs assistance Sitting-balance support: No upper extremity supported Sitting balance-Leahy Scale: Good     Standing balance support: No upper extremity supported Standing balance-Leahy Scale: Good Standing balance comment: Single leg balance >10 seconds. No balance deficits identified during ambulation                             Pertinent Vitals/Pain Pain Assessment: No/denies pain    Home Living Family/patient expects to be discharged to:: Private residence Living Arrangements: Spouse/significant other Available Help at Discharge: Family Type of Home: House Home Access: Stairs to enter Entrance Stairs-Rails: Psychiatric nurse of Steps: 4 Home Layout: Multi-level;Bed/bath upstairs Home Equipment: None      Prior Function Level of Independence: Independent         Comments: Independent with ADL/IADLs. Works and drives. NO falls     Hand Dominance   Dominant Hand: Right    Extremity/Trunk Assessment   Upper Extremity Assessment Upper Extremity Assessment: Overall WFL for tasks assessed    Lower Extremity  Assessment Lower Extremity Assessment: Overall WFL for tasks assessed       Communication   Communication: No difficulties  Cognition Arousal/Alertness: Awake/alert Behavior During Therapy: WFL for tasks assessed/performed Overall Cognitive Status: Within Functional Limits for tasks assessed                                        General Comments      Exercises     Assessment/Plan     PT Assessment Patent does not need any further PT services  PT Problem List         PT Treatment Interventions      PT Goals (Current goals can be found in the Care Plan section)  Acute Rehab PT Goals PT Goal Formulation: All assessment and education complete, DC therapy    Frequency     Barriers to discharge        Co-evaluation               AM-PAC PT "6 Clicks" Daily Activity  Outcome Measure Difficulty turning over in bed (including adjusting bedclothes, sheets and blankets)?: None Difficulty moving from lying on back to sitting on the side of the bed? : None Difficulty sitting down on and standing up from a chair with arms (e.g., wheelchair, bedside commode, etc,.)?: None Help needed moving to and from a bed to chair (including a wheelchair)?: None Help needed walking in hospital room?: None Help needed climbing 3-5 steps with a railing? : None 6 Click Score: 24    End of Session Equipment Utilized During Treatment: Gait belt Activity Tolerance: Patient tolerated treatment well Patient left: in bed;with call bell/phone within reach   PT Visit Diagnosis: Difficulty in walking, not elsewhere classified (R26.2)    Time: 0940-7680 PT Time Calculation (min) (ACUTE ONLY): 13 min   Charges:   PT Evaluation $PT Eval Low Complexity: 1 Procedure     PT G Codes:   PT G-Codes **NOT FOR INPATIENT CLASS** Functional Assessment Tool Used: AM-PAC 6 Clicks Basic Mobility Functional Limitation: Changing and maintaining body position Changing and Maintaining Body Position Current Status (S8110): 0 percent impaired, limited or restricted Changing and Maintaining Body Position Goal Status (R1594): 0 percent impaired, limited or restricted Changing and Maintaining Body Position Discharge Status (V8592): 0 percent impaired, limited or restricted    Phillips Grout PT, DPT    Jennifer Newman 12/13/2016, 1:19 PM

## 2016-12-14 ENCOUNTER — Encounter: Payer: Self-pay | Admitting: *Deleted

## 2016-12-14 LAB — COMPREHENSIVE METABOLIC PANEL
ALT: 97 U/L — ABNORMAL HIGH (ref 14–54)
ANION GAP: 5 (ref 5–15)
AST: 93 U/L — ABNORMAL HIGH (ref 15–41)
Albumin: 3.3 g/dL — ABNORMAL LOW (ref 3.5–5.0)
Alkaline Phosphatase: 216 U/L — ABNORMAL HIGH (ref 38–126)
BUN: 11 mg/dL (ref 6–20)
CO2: 29 mmol/L (ref 22–32)
Calcium: 8.4 mg/dL — ABNORMAL LOW (ref 8.9–10.3)
Chloride: 101 mmol/L (ref 101–111)
Creatinine, Ser: 0.55 mg/dL (ref 0.44–1.00)
GFR calc non Af Amer: >60 mL/min (ref >60)
Glucose, Bld: 106 mg/dL — ABNORMAL HIGH (ref 65–99)
Potassium: 3.2 mmol/L — ABNORMAL LOW (ref 3.5–5.1)
SODIUM: 135 mmol/L (ref 135–145)
Total Bilirubin: 0.7 mg/dL (ref 0.3–1.2)
Total Protein: 7 g/dL (ref 6.5–8.1)

## 2016-12-14 LAB — ECHOCARDIOGRAM COMPLETE
Height: 63 in
Weight: 3646.4 oz

## 2016-12-14 MED ORDER — FUROSEMIDE 10 MG/ML IJ SOLN
20.0000 mg | Freq: Once | INTRAMUSCULAR | Status: AC
  Administered 2016-12-14: 20 mg via INTRAVENOUS
  Filled 2016-12-14: qty 2

## 2016-12-14 MED ORDER — POTASSIUM CHLORIDE CRYS ER 20 MEQ PO TBCR
40.0000 meq | EXTENDED_RELEASE_TABLET | Freq: Once | ORAL | Status: AC
  Administered 2016-12-14: 40 meq via ORAL
  Filled 2016-12-14: qty 2

## 2016-12-14 MED ORDER — SUMATRIPTAN SUCCINATE 50 MG PO TABS
50.0000 mg | ORAL_TABLET | ORAL | Status: DC | PRN
  Administered 2016-12-14: 50 mg via ORAL
  Filled 2016-12-14 (×3): qty 1

## 2016-12-14 MED ORDER — METOPROLOL SUCCINATE ER 50 MG PO TB24: 50.0000 mg | ORAL_TABLET | Freq: Two times a day (BID) | ORAL | 0 refills | Status: DC

## 2016-12-14 NOTE — Progress Notes (Signed)
[facility] Cardiology [facility] Encounter Note  Patient: Jennifer Newman / Admit Date: 12/11/2016 / Date of Encounter: 12/14/2016, 8:20 AM   Subjective: Patient overall feels much better at this time. Abdominal pain improved significantly. No further body aches and pains. Patient's heart rate is significantly improved with now normal sinus rhythm with controlled heart rate  Review of Systems: Positive for: Weakness Negative for: Vision change, hearing change, syncope, dizziness, nausea, vomiting,diarrhea, bloody stool, stomach pain, cough, congestion, diaphoresis, urinary frequency, urinary pain,skin lesions, skin rashes Others previously listed  Objective: Telemetry: Normal sinus rhythm Physical Exam: Blood pressure (!) 155/80, pulse 78, temperature 98.5 F (36.9 C), temperature source Oral, resp. rate 18, height 5\' 3"  (1.6 m), weight 103.4 kg (227 lb 14.4 oz), SpO2 96 %. Body mass index is 40.37 kg/m. General: Well developed, well nourished, in no acute distress. Head: Normocephalic, atraumatic, sclera non-icteric, no xanthomas, nares are without discharge. Neck: No apparent masses Lungs: Normal respirations with no wheezes, no rhonchi, no rales , no crackles   Heart: Regular rate and rhythm, normal S1 S2, no murmur, no rub, no gallop, PMI is normal size and placement, carotid upstroke normal without bruit, jugular venous pressure normal Abdomen: Soft, non-tender, non-distended with normoactive bowel sounds. No hepatosplenomegaly. Abdominal aorta is normal size without bruit Extremities: No edema, no clubbing, no cyanosis, no ulcers,  Peripheral: 2+ radial, 2+ femoral, 2+ dorsal pedal pulses Neuro: Alert and oriented. Moves all extremities spontaneously. Psych:  Responds to questions appropriately with a normal affect.   Intake/Output Summary (Last 24 hours) at 12/14/16 0820 Last data filed at 12/14/16 5916  Gross per 24 hour  Intake              240 ml  Output             2300  ml  Net            -2060 ml    Inpatient Medications:  . DULoxetine  90 mg Oral Daily  . enoxaparin (LOVENOX) injection  40 mg Subcutaneous BID  . metoprolol succinate  50 mg Oral BID   Infusions:   Labs:  Recent Labs  12/12/16 0625 12/14/16 0337  NA 139 135  K 3.6 3.2*  CL 105 101  CO2 27 29  GLUCOSE 122* 106*  BUN 12 11  CREATININE 0.70 0.55  CALCIUM 8.0* 8.4*    Recent Labs  12/11/16 2211 12/14/16 0337  AST 64* 93*  ALT 57* 97*  ALKPHOS 197* 216*  BILITOT 0.9 0.7  PROT 8.7* 7.0  ALBUMIN 4.2 3.3*    Recent Labs  12/11/16 2211 12/12/16 0625  WBC 8.9 7.3  HGB 14.8 12.7  HCT 44.0 37.7  MCV 87.3 87.3  PLT 256 209    Recent Labs  12/11/16 2211  TROPONINI <0.03   Invalid input(s): POCBNP No results for input(s): HGBA1C in the last 72 hours.   Weights: Filed Weights   12/11/16 2209 12/12/16 0600  Weight: 90.7 kg (200 lb) 103.4 kg (227 lb 14.4 oz)     Radiology/Studies:  Dg Chest 1 View  Result Date: 12/13/2016 CLINICAL DATA:  Short of breath EXAM: CHEST 1 VIEW COMPARISON:  10/09/2013 FINDINGS: Mild cardiac enlargement. Mild vascular congestion without edema or effusion. Mild bibasilar atelectasis IMPRESSION: Mild pulmonary vascular congestion may reflect mild fluid overload Mild atelectasis in the lung bases. Electronically Signed   By: Franchot Gallo M.D.   On: 12/13/2016 08:48   Ct Head Wo Contrast  Result  Date: 12/13/2016 CLINICAL DATA:  Headache EXAM: CT HEAD WITHOUT CONTRAST TECHNIQUE: Contiguous axial images were obtained from the base of the skull through the vertex without intravenous contrast. COMPARISON:  None. FINDINGS: Brain: No evidence of acute infarction, hemorrhage, hydrocephalus, extra-axial collection or mass lesion/mass effect. Vascular: No hyperdense vessel or unexpected calcification. Skull: Negative Sinuses/Orbits: Negative Other: None IMPRESSION: Negative CT head Electronically Signed   By: Franchot Gallo M.D.   On: 12/13/2016  09:22   Ct Angio Chest Pe W Or Wo Contrast  Result Date: 12/12/2016 CLINICAL DATA:  Acute onset of upper abdominal pain, nausea, vomiting and diarrhea. Tachycardia and nausea. Initial encounter. EXAM: CT ANGIOGRAPHY CHEST CT ABDOMEN AND PELVIS WITH CONTRAST TECHNIQUE: Multidetector CT imaging of the chest was performed using the standard protocol during bolus administration of intravenous contrast. Multiplanar CT image reconstructions and MIPs were obtained to evaluate the vascular anatomy. Multidetector CT imaging of the abdomen and pelvis was performed using the standard protocol during bolus administration of intravenous contrast. CONTRAST:  100 mL of Isovue 370 IV contrast COMPARISON:  Right upper quadrant ultrasound performed 12/11/2016 FINDINGS: CTA CHEST FINDINGS Cardiovascular: There is no evidence of aortic dissection. There is no evidence of aneurysmal dilatation. Minimal calcification is noted along the left subclavian artery. The heart remains borderline normal in size. There is no evidence of significant pulmonary embolus. Mediastinum/Nodes: The mediastinum is grossly unremarkable in appearance. No mediastinal lymphadenopathy is seen. No pericardial effusion is identified. The visualized portions of the thyroid gland are unremarkable. No axillary lymphadenopathy is seen. Lungs/Pleura: Mild bilateral dependent subsegmental atelectasis is noted. No pleural effusion or pneumothorax is seen. No masses are identified. Musculoskeletal: No acute osseous abnormalities are identified. The visualized musculature is unremarkable in appearance. Review of the MIP images confirms the above findings. CT ABDOMEN and PELVIS FINDINGS Hepatobiliary: The liver is unremarkable in appearance. The gallbladder is unremarkable in appearance. The common bile duct remains normal in caliber. Pancreas: The pancreas is within normal limits. Spleen: The spleen is unremarkable in appearance. Adrenals/Urinary Tract: The adrenal  glands are unremarkable in appearance. The kidneys are within normal limits. There is no evidence of hydronephrosis. No renal or ureteral stones are identified. No perinephric stranding is seen. Stomach/Bowel: The stomach is unremarkable in appearance. The small bowel is within normal limits. The appendix is normal in caliber, without evidence of appendicitis. The colon is unremarkable in appearance. Vascular/Lymphatic: The abdominal aorta is unremarkable in appearance. The inferior vena cava is grossly unremarkable. No retroperitoneal lymphadenopathy is seen. No pelvic sidewall lymphadenopathy is identified. Reproductive: The bladder is mildly distended and grossly unremarkable. The uterus is unremarkable in appearance. The ovaries are relatively symmetric. No suspicious adnexal masses are seen. Other: Mild nonspecific haziness is noted within the proximal small bowel mesentery. Musculoskeletal: No acute osseous abnormalities are identified. The visualized musculature is unremarkable in appearance. Review of the MIP images confirms the above findings. IMPRESSION: 1. No evidence of significant pulmonary embolus. 2. No evidence of aortic dissection. No evidence of aneurysmal dilatation. 3. Mild bilateral dependent subsegmental atelectasis noted. Lungs otherwise clear. 4. Mild nonspecific haziness within the proximal small bowel mesentery. This may reflect mesenteritis of uncertain chronicity. Would correlate with the patient's symptoms. Electronically Signed   By: Garald Balding M.D.   On: 12/12/2016 01:58   Ct Abdomen Pelvis W Contrast  Result Date: 12/12/2016 CLINICAL DATA:  Acute onset of upper abdominal pain, nausea, vomiting and diarrhea. Tachycardia and nausea. Initial encounter. EXAM: CT ANGIOGRAPHY CHEST CT ABDOMEN AND  PELVIS WITH CONTRAST TECHNIQUE: Multidetector CT imaging of the chest was performed using the standard protocol during bolus administration of intravenous contrast. Multiplanar CT image  reconstructions and MIPs were obtained to evaluate the vascular anatomy. Multidetector CT imaging of the abdomen and pelvis was performed using the standard protocol during bolus administration of intravenous contrast. CONTRAST:  100 mL of Isovue 370 IV contrast COMPARISON:  Right upper quadrant ultrasound performed 12/11/2016 FINDINGS: CTA CHEST FINDINGS Cardiovascular: There is no evidence of aortic dissection. There is no evidence of aneurysmal dilatation. Minimal calcification is noted along the left subclavian artery. The heart remains borderline normal in size. There is no evidence of significant pulmonary embolus. Mediastinum/Nodes: The mediastinum is grossly unremarkable in appearance. No mediastinal lymphadenopathy is seen. No pericardial effusion is identified. The visualized portions of the thyroid gland are unremarkable. No axillary lymphadenopathy is seen. Lungs/Pleura: Mild bilateral dependent subsegmental atelectasis is noted. No pleural effusion or pneumothorax is seen. No masses are identified. Musculoskeletal: No acute osseous abnormalities are identified. The visualized musculature is unremarkable in appearance. Review of the MIP images confirms the above findings. CT ABDOMEN and PELVIS FINDINGS Hepatobiliary: The liver is unremarkable in appearance. The gallbladder is unremarkable in appearance. The common bile duct remains normal in caliber. Pancreas: The pancreas is within normal limits. Spleen: The spleen is unremarkable in appearance. Adrenals/Urinary Tract: The adrenal glands are unremarkable in appearance. The kidneys are within normal limits. There is no evidence of hydronephrosis. No renal or ureteral stones are identified. No perinephric stranding is seen. Stomach/Bowel: The stomach is unremarkable in appearance. The small bowel is within normal limits. The appendix is normal in caliber, without evidence of appendicitis. The colon is unremarkable in appearance. Vascular/Lymphatic: The  abdominal aorta is unremarkable in appearance. The inferior vena cava is grossly unremarkable. No retroperitoneal lymphadenopathy is seen. No pelvic sidewall lymphadenopathy is identified. Reproductive: The bladder is mildly distended and grossly unremarkable. The uterus is unremarkable in appearance. The ovaries are relatively symmetric. No suspicious adnexal masses are seen. Other: Mild nonspecific haziness is noted within the proximal small bowel mesentery. Musculoskeletal: No acute osseous abnormalities are identified. The visualized musculature is unremarkable in appearance. Review of the MIP images confirms the above findings. IMPRESSION: 1. No evidence of significant pulmonary embolus. 2. No evidence of aortic dissection. No evidence of aneurysmal dilatation. 3. Mild bilateral dependent subsegmental atelectasis noted. Lungs otherwise clear. 4. Mild nonspecific haziness within the proximal small bowel mesentery. This may reflect mesenteritis of uncertain chronicity. Would correlate with the patient's symptoms. Electronically Signed   By: Garald Balding M.D.   On: 12/12/2016 01:58   US Abdomen Limited Ruq  Result Date: 12/11/2016 CLINICAL DATA:  Acute onset of severe epigastric and right upper quadrant abdominal pain. Nausea, loose stools and tachycardia. Initial encounter. EXAM: ULTRASOUND ABDOMEN LIMITED RIGHT UPPER QUADRANT COMPARISON:  None. FINDINGS: Gallbladder: No gallstones or wall thickening visualized. No sonographic Murphy sign noted by sonographer. Common bile duct: Diameter: 0.5 cm, within normal limits in caliber. Liver: No focal lesion identified. Diffuse increased parenchymal echogenicity and coarsened echotexture likely reflects fatty infiltration. IMPRESSION: 1. No acute abnormality at the right upper quadrant. 2. Diffuse fatty infiltration within the liver. Electronically Signed   By: Garald Balding M.D.   On: 12/11/2016 23:44     Assessment and Recommendation  51 y.o. female with the  significant abdominal discomfort and liver enzyme elevation with subsequent possible dehydration and pain driving sinus tachycardia with rapid ventricular rate now improved after improvements of  overall symptoms and condition and metoprolol use 1. Continue metoprolol at low dose for hypertension and heart rate control 2. No further cardiac diagnostics and her treatment necessary at this time 3. Proceed to any diagnostic procedures from the gastric standpoint to due to low risk from the cardiac standpoint 4. Okay for discharge home from cardiac standpoint 5. Call if further questions  Signed, Serafina Royals M.D. FACC

## 2016-12-14 NOTE — Plan of Care (Signed)
Problem: Pain Managment: Goal: General experience of comfort will improve Outcome: Not Progressing Pt complained of pain twice treated with tylenol with some relief, offered morphine but pt did not want it. Will continue to monitor.

## 2016-12-14 NOTE — Discharge Summary (Signed)
[place] [facility] [place] NAME: Jennifer Newman    MR#:  [id number]  DATE OF BIRTH:  09-Jul-1965  DATE OF ADMISSION:  12/11/2016 ADMITTING PHYSICIAN: Saundra Shelling, MD  DATE OF DISCHARGE: 12/14/2016  PRIMARY CARE PHYSICIAN: System, Pcp Not In    ADMISSION DIAGNOSIS:  Shortness of breath [R06.02] Tachycardia [R00.0] Pain of upper abdomen [R10.10] Essential hypertension [I10]  DISCHARGE DIAGNOSIS:  Active Problems:   Tachycardia   SECONDARY DIAGNOSIS:   Past Medical History:  Diagnosis Date  . Hypertension   . Vertigo     HOSPITAL COURSE:   51 year old female with history of hypertension who presents with abdominal pain and found to have sinus tachycardia.  1. Sinus tachycardia:  Patient's heart rate is better improved. Heart rate was due to dehydration and pain from headache. Metoprolol was increased. Patient was a value by cardiology. Patient will follow-up with cardiology on Monday. Echocardiogram was performed which will be reviewed on Monday with cardiologist.   2. Abdominal pain: Patient had no abdominal pain during hospital stay. She was tolerating her diet well. She underwent CT scan with results as stated on discharge summary. Right upper quadrant ultrasound shows diffuse fatty infiltration  3. Depression: Continue Cymbalta  4. Essential hypertension: Continue increased dose of metoprolol and HCTZ   5. Headache: CT the head was negative. She had no signs of meningitis.   DISCHARGE CONDITIONS AND DIET:   Stable  Cardiac diet  CONSULTS OBTAINED:  Treatment Team:  Corey Skains, MD  DRUG ALLERGIES:  No Known Allergies  DISCHARGE MEDICATIONS:   Current Discharge Medication List    CONTINUE these medications which have CHANGED   Details  metoprolol succinate (TOPROL-XL) 50 MG 24 hr tablet Take 1 tablet (50 mg total) by mouth 2 (two) times daily. Take with or immediately following a meal. Qty: 60  tablet, Refills: 0      CONTINUE these medications which have NOT CHANGED   Details  !! DULoxetine (CYMBALTA) 30 MG capsule Take 1 capsule by mouth daily.    !! DULoxetine (CYMBALTA) 60 MG capsule Take 1 capsule by mouth daily.    furosemide (LASIX) 40 MG tablet Take 1 tablet by mouth daily.    hydrochlorothiazide (HYDRODIURIL) 12.5 MG tablet Take 1 tablet by mouth daily.    loratadine (CLARITIN) 10 MG tablet Take 1 tablet by mouth daily.     !! - Potential duplicate medications found. Please discuss with provider.        Today   CHIEF COMPLAINT:  Patient has HA No neck stiffness or photophobia. She wants to try Imitrex   VITAL SIGNS:  Blood pressure (!) 155/80, pulse 78, temperature 98.5 F (36.9 C), temperature source Oral, resp. rate 18, height 5\' 3"  (1.6 m), weight 103.4 kg (227 lb 14.4 oz), SpO2 96 %.   REVIEW OF SYSTEMS:  Review of Systems  Constitutional: Negative.  Negative for chills, fever and malaise/fatigue.  HENT: Negative for ear discharge, ear pain, hearing loss, nosebleeds and sore throat.   Eyes: Negative.  Negative for blurred vision and pain.  Respiratory: Negative.  Negative for cough, hemoptysis, shortness of breath and wheezing.   Cardiovascular: Negative.  Negative for chest pain, palpitations and leg swelling.  Gastrointestinal: Negative.  Negative for abdominal pain, blood in stool, diarrhea, nausea and vomiting.  Genitourinary: Negative.  Negative for dysuria.  Musculoskeletal: Negative.  Negative for back pain.  Skin: Negative.   Neurological: Positive for headaches. Negative for dizziness, tremors,  speech change, focal weakness and seizures.  Endo/Heme/Allergies: Negative.  Does not bruise/bleed easily.  Psychiatric/Behavioral: Negative.  Negative for depression, hallucinations and suicidal ideas.     PHYSICAL EXAMINATION:  GENERAL:  51 y.o.-year-old patient lying in the bed with no acute distress.  NECK:  Supple, no jugular venous  distention. No thyroid enlargement, no tenderness.  LUNGS: Normal breath sounds bilaterally, no wheezing, rales,rhonchi  No use of accessory muscles of respiration.  CARDIOVASCULAR: S1, S2 normal. No murmurs, rubs, or gallops.  ABDOMEN: Soft, non-tender, non-distended. Bowel sounds present. No organomegaly or mass.  EXTREMITIES: No pedal edema, cyanosis, or clubbing.  PSYCHIATRIC: The patient is alert and oriented x 3.  SKIN: No obvious rash, lesion, or ulcer.   DATA REVIEW:   CBC  Recent Labs Lab 12/12/16 0625  WBC 7.3  HGB 12.7  HCT 37.7  PLT 209    Chemistries   Recent Labs Lab 12/14/16 0337  NA 135  K 3.2*  CL 101  CO2 29  GLUCOSE 106*  BUN 11  CREATININE 0.55  CALCIUM 8.4*  AST 93*  ALT 97*  ALKPHOS 216*  BILITOT 0.7    Cardiac Enzymes  Recent Labs Lab 12/11/16 2211  [place] <0.03    Microbiology Results  @[id number] @  RADIOLOGY:  Dg Chest 1 View  Result Date: 12/13/2016 CLINICAL DATA:  Short of breath EXAM: CHEST 1 VIEW COMPARISON:  10/09/2013 FINDINGS: Mild cardiac enlargement. Mild vascular congestion without edema or effusion. Mild bibasilar atelectasis IMPRESSION: Mild pulmonary vascular congestion may reflect mild fluid overload Mild atelectasis in the lung bases. Electronically Signed   By: Franchot Gallo M.D.   On: 12/13/2016 08:48   Ct Head Wo Contrast  Result Date: 12/13/2016 CLINICAL DATA:  Headache EXAM: CT HEAD WITHOUT CONTRAST TECHNIQUE: Contiguous axial images were obtained from the base of the skull through the vertex without intravenous contrast. COMPARISON:  None. FINDINGS: Brain: No evidence of acute infarction, hemorrhage, hydrocephalus, extra-axial collection or mass lesion/mass effect. Vascular: No hyperdense vessel or unexpected calcification. Skull: Negative Sinuses/Orbits: Negative Other: None IMPRESSION: Negative CT head Electronically Signed   By: Franchot Gallo M.D.   On: 12/13/2016 09:22      Current Discharge  Medication List    CONTINUE these medications which have CHANGED   Details  metoprolol succinate (TOPROL-XL) 50 MG 24 hr tablet Take 1 tablet (50 mg total) by mouth 2 (two) times daily. Take with or immediately following a meal. Qty: 60 tablet, Refills: 0      CONTINUE these medications which have NOT CHANGED   Details  !! DULoxetine (CYMBALTA) 30 MG capsule Take 1 capsule by mouth daily.    !! DULoxetine (CYMBALTA) 60 MG capsule Take 1 capsule by mouth daily.    furosemide (LASIX) 40 MG tablet Take 1 tablet by mouth daily.    hydrochlorothiazide (HYDRODIURIL) 12.5 MG tablet Take 1 tablet by mouth daily.    loratadine (CLARITIN) 10 MG tablet Take 1 tablet by mouth daily.     !! - Potential duplicate medications found. Please discuss with provider.         Management plans discussed with the patient and she is in agreement. Stable for discharge home  Patient should follow up with cardiology Monday  CODE STATUS:     Code Status Orders        Start     Ordered   12/12/16 0556  Full code  Continuous     12/12/16 0555    Code Status History  Date Active Date Inactive Code Status Order ID Comments User Context   This patient has a current code status but no historical code status.      TOTAL TIME TAKING CARE OF THIS PATIENT: 37 minutes.    Note: This dictation was prepared with Dragon dictation along with smaller phrase technology. Any transcriptional errors that result from this process are unintentional.  Joanann Mies M.D on 12/14/2016 at 9:15 AM  Between 7am to 6pm - Pager - [phone] After 6pm go to [email or web address] - password [email or web address] [facility]  Office  [phone]  CC: Primary care physician; System, Pcp Not In

## 2016-12-14 NOTE — Progress Notes (Signed)
Discharge instructions reviewed with patient and familly. Pt/family verbalized understanding. IV removed, dressing applied.

## 2016-12-14 NOTE — Plan of Care (Signed)
Problem: Pain Managment: Goal: General experience of comfort will improve Outcome: Completed/Met Date Met: 12/14/16 Patient pain will be managed with PRN pain medication while in the hospital.   

## 2017-08-13 ENCOUNTER — Ambulatory Visit (INDEPENDENT_AMBULATORY_CARE_PROVIDER_SITE_OTHER): Payer: Self-pay | Admitting: Psychiatry

## 2017-08-13 ENCOUNTER — Encounter: Payer: Self-pay | Admitting: Psychiatry

## 2017-08-13 ENCOUNTER — Other Ambulatory Visit: Payer: Self-pay

## 2017-08-13 VITALS — BP 176/91 | HR 75 | Temp 97.9°F | Wt 227.0 lb

## 2017-08-13 DIAGNOSIS — F1210 Cannabis abuse, uncomplicated: Secondary | ICD-10-CM

## 2017-08-13 DIAGNOSIS — F3181 Bipolar II disorder: Secondary | ICD-10-CM

## 2017-08-13 MED ORDER — DULOXETINE HCL 60 MG PO CPEP: 60.0000 mg | ORAL_CAPSULE | Freq: Every day | ORAL | 1 refills | Status: DC

## 2017-08-13 MED ORDER — DULOXETINE HCL 30 MG PO CPEP: 30.0000 mg | ORAL_CAPSULE | Freq: Every day | ORAL | 1 refills | Status: DC

## 2017-08-13 MED ORDER — LURASIDONE HCL 40 MG PO TABS: 40.0000 mg | ORAL_TABLET | Freq: Every day | ORAL | 1 refills | Status: DC

## 2017-08-13 MED ORDER — HYDROXYZINE PAMOATE 25 MG PO CAPS: 25.0000 mg | ORAL_CAPSULE | Freq: Two times a day (BID) | ORAL | 1 refills | Status: DC | PRN

## 2017-08-13 NOTE — Patient Instructions (Signed)
Hydroxyzine capsules or tablets What is this medicine? HYDROXYZINE (hye [place] i zeen) is an antihistamine. This medicine is used to treat allergy symptoms. It is also used to treat anxiety and tension. This medicine can be used with other medicines to induce sleep before surgery. This medicine may be used for other purposes; ask your health care provider or pharmacist if you have questions. COMMON BRAND NAME(S): ANX, Atarax, Rezine, Vistaril What should I tell my health care provider before I take this medicine? They need to know if you have any of these conditions: -any chronic illness -difficulty passing urine -glaucoma -heart disease -kidney disease -liver disease -lung disease -an unusual or allergic reaction to hydroxyzine, cetirizine, other medicines, foods, dyes, or preservatives -pregnant or trying to get pregnant -breast-feeding How should I use this medicine? Take this medicine by mouth with a full glass of water. Follow the directions on the prescription label. You may take this medicine with food or on an empty stomach. Take your medicine at regular intervals. Do not take your medicine more often than directed. Talk to your pediatrician regarding the use of this medicine in children. Special care may be needed. While this drug may be prescribed for children as young as 75 years of age for selected conditions, precautions do apply. Patients over 62 years old may have a stronger reaction and need a smaller dose. Overdosage: If you think you have taken too much of this medicine contact a poison control center or emergency room at once. NOTE: This medicine is only for you. Do not share this medicine with others. What if I miss a dose? If you miss a dose, take it as soon as you can. If it is almost time for your next dose, take only that dose. Do not take double or extra doses. What may interact with this medicine? -alcohol -barbiturate medicines for sleep or seizures -medicines for  colds, allergies -medicines for depression, anxiety, or emotional disturbances -medicines for pain -medicines for sleep -muscle relaxants This list may not describe all possible interactions. Give your health care provider a list of all the medicines, herbs, non-prescription drugs, or dietary supplements you use. Also tell them if you smoke, drink alcohol, or use illegal drugs. Some items may interact with your medicine. What should I watch for while using this medicine? Tell your doctor or health care professional if your symptoms do not improve. You may get drowsy or dizzy. Do not drive, use machinery, or do anything that needs mental alertness until you know how this medicine affects you. Do not stand or sit up quickly, especially if you are an older patient. This reduces the risk of dizzy or fainting spells. Alcohol may interfere with the effect of this medicine. Avoid alcoholic drinks. Your mouth may get dry. Chewing sugarless gum or sucking hard candy, and drinking plenty of water may help. Contact your doctor if the problem does not go away or is severe. This medicine may cause dry eyes and blurred vision. If you wear contact lenses you may feel some discomfort. Lubricating drops may help. See your eye doctor if the problem does not go away or is severe. If you are receiving skin tests for allergies, tell your doctor you are using this medicine. What side effects may I notice from receiving this medicine? Side effects that you should report to your doctor or health care professional as soon as possible: -fast or irregular heartbeat -difficulty passing urine -seizures -slurred speech or confusion -tremor Side effects that  usually do not require medical attention (report to your doctor or health care professional if they continue or are bothersome): -constipation -drowsiness -fatigue -headache -stomach upset This list may not describe all possible side effects. Call your doctor for  medical advice about side effects. You may report side effects to FDA at 1-800-FDA-1088. Where should I keep my medicine? Keep out of the reach of children. Store at room temperature between 15 and 30 degrees C (59 and 86 degrees F). Keep container tightly closed. Throw away any unused medicine after the expiration date. NOTE: This sheet is a summary. It may not cover all possible information. If you have questions about this medicine, talk to your doctor, pharmacist, or health care provider.  2018 Elsevier/Gold Standard (2007-11-01 14:50:59) Lurasidone oral tablet What is this medicine? LURASIDONE (loo RAS i done) is an antipsychotic. It is used to treat schizophrenia or bipolar disorder, also known as manic-depression. This medicine may be used for other purposes; ask your health care provider or pharmacist if you have questions. COMMON BRAND NAME(S): Latuda What should I tell my health care provider before I take this medicine? They need to know if you have any of these conditions: -dementia -diabetes -difficulty swallowing -heart disease -history of breast cancer -kidney disease -liver disease -low blood counts, like low white cell, platelet, or red cell counts -low blood pressure -Parkinson's disease -seizures -suicidal thoughts, plans, or attempt; a previous suicide attempt by you or a family member -an unusual or allergic reaction to lurasidone, other medicines, foods, dyes, or preservatives -pregnant or trying to get pregnant -breast-feeding How should I use this medicine? Take this medicine by mouth with a glass of water. Follow the directions on the prescription label. Take this medicine with food. Take your medicine at regular intervals. Do not take it more often than directed. Do not stop taking except on your doctor's advice. Talk to your pediatrician regarding the use of this medicine in children. While this drug may be prescribed for children as young as 13 years for  selected conditions, precautions do apply. Overdosage: If you think you have taken too much of this medicine contact a poison control center or emergency room at once. NOTE: This medicine is only for you. Do not share this medicine with others. What if I miss a dose? If you miss a dose, take it as soon as you can. If it is almost time for your next dose, take only that dose. Do not take double or extra doses. What may interact with this medicine? Do not take this medicine with any of the following medications: -dalfopristin; quinupristin -delavirdine -indinavir -isoniazid -itraconazole -ketoconazole -lumacaftor; ivacaftor -metoclopramide -rifampin -ritonavir -tipranavir This medicine may also interact with the following medications: -alcohol -certain medicines for anxiety or sleep -diltiazem -digoxin -lithium -medicines for blood pressure This list may not describe all possible interactions. Give your health care provider a list of all the medicines, herbs, non-prescription drugs, or dietary supplements you use. Also tell them if you smoke, drink alcohol, or use illegal drugs. Some items may interact with your medicine. What should I watch for while using this medicine? Visit your doctor or health care professional for regular checks on your progress. It may be several weeks before you see the full effects of this medicine. Notify your doctor or health care professional if your symptoms get worse, if you have new symptoms, if you are having an unusual effect from this medicine, or if you feel out of control, very discouraged  or think you might harm yourself or others. Do not suddenly stop taking this medicine. You may need to gradually reduce the dose. Ask your doctor or health care professional for advice. You may get dizzy or drowsy. Do not drive, use machinery, or do anything that needs mental alertness until you know how this medicine affects you. Do not stand or sit up quickly,  especially if you are an older patient. This reduces the risk of dizzy or fainting spells. Alcohol can increase dizziness and drowsiness. Avoid alcoholic drinks. This medicine may cause dry eyes and blurred vision. If you wear contact lenses you may feel some discomfort. Lubricating drops may help. See your eye doctor if the problem does not go away or is severe. This medicine can reduce the response of your body to heat or cold. Dress warm in cold weather and stay hydrated in hot weather. If possible, avoid extreme temperatures like saunas, hot tubs, very hot or cold showers, or activities that can cause dehydration such as vigorous exercise. What side effects may I notice from receiving this medicine? Side effects that you should report to your doctor or health care professional as soon as possible: -allergic reactions like skin rash, itching or hives, swelling of the face, lips, or tongue -confusion -feeling faint or lightheaded, falls -fever or chills, sore throat -high fever -inability to control muscle movements in the face, mouth, hands, arms, or legs -increased hunger or thirst -increased urination -missed menstrual periods -restlessness or need to keep moving -seizures -stiffness, spasms, trembling -suicidal thoughts or other mood changes -unusually weak or tired Side effects that usually do not require medical attention (report to your doctor or health care professional if they continue or are bothersome): -blurred vision -change in sex drive or performance -diarrhea -drowsiness -nausea, vomiting -weight gain This list may not describe all possible side effects. Call your doctor for medical advice about side effects. You may report side effects to FDA at 1-800-FDA-1088. Where should I keep my medicine? Keep out of the reach of children. Store at room temperature between 15 and 30 degrees C (59 and 86 degrees F). Throw away any unused medicine after the expiration date. NOTE:  This sheet is a summary. It may not cover all possible information. If you have questions about this medicine, talk to your doctor, pharmacist, or health care provider.  2018 Elsevier/Gold Standard (2015-08-04 10:23:22)
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
## 2017-08-14 ENCOUNTER — Encounter: Payer: Self-pay | Admitting: Psychiatry

## 2017-08-21 ENCOUNTER — Ambulatory Visit (INDEPENDENT_AMBULATORY_CARE_PROVIDER_SITE_OTHER): Payer: Self-pay | Admitting: Licensed Clinical Social Worker

## 2017-08-21 ENCOUNTER — Other Ambulatory Visit: Payer: Self-pay

## 2017-08-21 ENCOUNTER — Encounter: Payer: Self-pay | Admitting: Licensed Clinical Social Worker

## 2017-08-21 DIAGNOSIS — F3181 Bipolar II disorder: Secondary | ICD-10-CM
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
## 2017-09-03 ENCOUNTER — Ambulatory Visit: Payer: BLUE CROSS/BLUE SHIELD | Admitting: Psychiatry

## 2017-09-03 ENCOUNTER — Encounter: Payer: Self-pay | Admitting: Psychiatry

## 2017-09-03 VITALS — BP 144/84 | HR 71 | Ht 62.0 in | Wt 226.0 lb

## 2017-09-03 DIAGNOSIS — F3181 Bipolar II disorder: Secondary | ICD-10-CM | POA: Diagnosis not present

## 2017-09-03 DIAGNOSIS — F1211 Cannabis abuse, in remission: Secondary | ICD-10-CM

## 2017-09-03 MED ORDER — DULOXETINE HCL 60 MG PO CPEP: 60.0000 mg | ORAL_CAPSULE | Freq: Every day | ORAL | 1 refills | Status: DC
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
## 2017-09-11 ENCOUNTER — Ambulatory Visit: Payer: Self-pay | Admitting: Licensed Clinical Social Worker

## 2017-09-13 ENCOUNTER — Ambulatory Visit: Payer: BLUE CROSS/BLUE SHIELD | Admitting: Licensed Clinical Social Worker

## 2017-09-13 DIAGNOSIS — F3181 Bipolar II disorder: Secondary | ICD-10-CM

## 2017-09-13 NOTE — Progress Notes (Signed)
  THERAPIST PROGRESS NOTE   Date of Service:   09/13/17  Session Time:   1hour  Patient:   Jennifer Newman   DOB:   Nov 12, 1965  MR Number:  [id number]  Location:  [facility] [place] [address] Dept: [phone]            Provider/Observer:  Lubertha South Counselor  Risk of Suicide/Violence: low   Diagnosis:    Bipolar 2 disorder, major depressive episode ([place])  Type of Therapy: Individual Therapy  Treatment Goals addressed: Coping  Participation Level: Active  Interventions: CBT, Motivational Interviewing and Solution Focused   Behavioral Response: CasualAlertEuthymic   Summary: Therapist met with Patient in an initial therapy session to assess current mood and to build rapport. Therapist engaged Patient in discussion about her life and what is going well for her. Therapist provided support for Patient as she shared details about her life, her current stressors, mood, coping skills, her past, and her children. Therapist prompted Patient to discuss her support system and ways that she manages her daily stress, anger, and frustrations.  Therapist assisted Patient with processing her thoughts and feelings about her weekend and her relationship with her husband. Patient reports that she is going to [place] to assist a friend look at colleges.  Patient reports that she at times want to leave her husband due to her lack of loving him.  She reports that she wants to move to [place].  She reports that she is not satisfied with her husband.  She reports that the communication is poor and that they are not on the same level.    Plan: Jennifer Newman will enjoy her weekend in [place] and think about her relationship.   Return again in2 weeks.

## 2017-10-04 ENCOUNTER — Other Ambulatory Visit: Payer: Self-pay

## 2017-10-04 ENCOUNTER — Encounter: Payer: Self-pay | Admitting: Psychiatry

## 2017-10-04 ENCOUNTER — Ambulatory Visit: Payer: BLUE CROSS/BLUE SHIELD | Admitting: Psychiatry

## 2017-10-04 VITALS — BP 131/73 | HR 97 | Temp 97.8°F | Wt 227.4 lb

## 2017-10-04 DIAGNOSIS — F1210 Cannabis abuse, uncomplicated: Secondary | ICD-10-CM

## 2017-10-04 DIAGNOSIS — F3181 Bipolar II disorder: Secondary | ICD-10-CM

## 2017-10-04 MED ORDER — LURASIDONE HCL 60 MG PO TABS: 60.0000 mg | ORAL_TABLET | Freq: Every day | ORAL | 0 refills | Status: DC

## 2017-10-04 MED ORDER — HYDROXYZINE PAMOATE 25 MG PO CAPS: 25.0000 mg | ORAL_CAPSULE | Freq: Two times a day (BID) | ORAL | 0 refills | Status: DC | PRN

## 2017-10-04 MED ORDER — DULOXETINE HCL 30 MG PO CPEP: 30.0000 mg | ORAL_CAPSULE | Freq: Every day | ORAL | 0 refills | Status: DC
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
## 2017-12-06 ENCOUNTER — Ambulatory Visit: Payer: BLUE CROSS/BLUE SHIELD | Admitting: Psychiatry

## 2018-01-03 ENCOUNTER — Other Ambulatory Visit: Payer: Self-pay | Admitting: Psychiatry

## 2018-01-03 DIAGNOSIS — F3181 Bipolar II disorder: Secondary | ICD-10-CM

## 2018-05-10 ENCOUNTER — Ambulatory Visit
Admission: RE | Admit: 2018-05-10 | Discharge: 2018-05-10 | Disposition: A | Discharge status: Home / Self Care | Payer: BLUE CROSS/BLUE SHIELD | Source: Ambulatory Visit | Attending: Physician Assistant | Admitting: Physician Assistant

## 2018-05-10 ENCOUNTER — Other Ambulatory Visit: Payer: Self-pay | Admitting: Physician Assistant

## 2018-05-10 ENCOUNTER — Other Ambulatory Visit (INDEPENDENT_AMBULATORY_CARE_PROVIDER_SITE_OTHER): Payer: Self-pay | Admitting: Physician Assistant

## 2018-05-10 DIAGNOSIS — M549 Dorsalgia, unspecified: Secondary | ICD-10-CM

## 2018-08-21 ENCOUNTER — Emergency Department: Payer: BLUE CROSS/BLUE SHIELD

## 2018-08-21 ENCOUNTER — Inpatient Hospital Stay
Admission: EM | Admit: 2018-08-21 | Discharge: 2018-09-04 | DRG: 674 | Disposition: A | Discharge status: Other Acute Inpatient Hospital | Payer: BLUE CROSS/BLUE SHIELD | Attending: Internal Medicine | Admitting: Internal Medicine

## 2018-08-21 ENCOUNTER — Other Ambulatory Visit: Payer: Self-pay

## 2018-08-21 ENCOUNTER — Encounter: Payer: Self-pay | Admitting: Emergency Medicine

## 2018-08-21 ENCOUNTER — Inpatient Hospital Stay: Payer: BLUE CROSS/BLUE SHIELD

## 2018-08-21 DIAGNOSIS — Z6841 Body Mass Index (BMI) 40.0 and over, adult: Secondary | ICD-10-CM

## 2018-08-21 DIAGNOSIS — R809 Proteinuria, unspecified: Secondary | ICD-10-CM

## 2018-08-21 DIAGNOSIS — E875 Hyperkalemia: Secondary | ICD-10-CM | POA: Diagnosis present

## 2018-08-21 DIAGNOSIS — N059 Unspecified nephritic syndrome with unspecified morphologic changes: Secondary | ICD-10-CM | POA: Diagnosis present

## 2018-08-21 DIAGNOSIS — N186 End stage renal disease: Secondary | ICD-10-CM | POA: Diagnosis present

## 2018-08-21 DIAGNOSIS — N170 Acute kidney failure with tubular necrosis: Secondary | ICD-10-CM | POA: Diagnosis present

## 2018-08-21 DIAGNOSIS — Z79899 Other long term (current) drug therapy: Secondary | ICD-10-CM

## 2018-08-21 DIAGNOSIS — I776 Arteritis, unspecified: Secondary | ICD-10-CM | POA: Diagnosis present

## 2018-08-21 DIAGNOSIS — N179 Acute kidney failure, unspecified: Secondary | ICD-10-CM | POA: Diagnosis not present

## 2018-08-21 DIAGNOSIS — F429 Obsessive-compulsive disorder, unspecified: Secondary | ICD-10-CM | POA: Diagnosis present

## 2018-08-21 DIAGNOSIS — F319 Bipolar disorder, unspecified: Secondary | ICD-10-CM | POA: Diagnosis present

## 2018-08-21 DIAGNOSIS — R040 Epistaxis: Secondary | ICD-10-CM | POA: Diagnosis present

## 2018-08-21 DIAGNOSIS — N019 Rapidly progressive nephritic syndrome with unspecified morphologic changes: Secondary | ICD-10-CM

## 2018-08-21 DIAGNOSIS — F4310 Post-traumatic stress disorder, unspecified: Secondary | ICD-10-CM | POA: Diagnosis present

## 2018-08-21 DIAGNOSIS — D472 Monoclonal gammopathy: Secondary | ICD-10-CM

## 2018-08-21 DIAGNOSIS — D631 Anemia in chronic kidney disease: Secondary | ICD-10-CM | POA: Diagnosis present

## 2018-08-21 DIAGNOSIS — I1311 Hypertensive heart and chronic kidney disease without heart failure, with stage 5 chronic kidney disease, or end stage renal disease: Secondary | ICD-10-CM | POA: Diagnosis present

## 2018-08-21 DIAGNOSIS — R197 Diarrhea, unspecified: Secondary | ICD-10-CM | POA: Diagnosis present

## 2018-08-21 DIAGNOSIS — Z7951 Long term (current) use of inhaled steroids: Secondary | ICD-10-CM | POA: Diagnosis not present

## 2018-08-21 DIAGNOSIS — N185 Chronic kidney disease, stage 5: Secondary | ICD-10-CM | POA: Insufficient documentation

## 2018-08-21 DIAGNOSIS — N1330 Unspecified hydronephrosis: Secondary | ICD-10-CM | POA: Diagnosis present

## 2018-08-21 DIAGNOSIS — I10 Essential (primary) hypertension: Secondary | ICD-10-CM | POA: Diagnosis not present

## 2018-08-21 LAB — BASIC METABOLIC PANEL
Anion gap: 18 — ABNORMAL HIGH (ref 5–15)
BUN: 137 mg/dL — ABNORMAL HIGH (ref 6–20)
CO2: 12 mmol/L — AB (ref 22–32)
Calcium: 8.1 mg/dL — ABNORMAL LOW (ref 8.9–10.3)
Chloride: 106 mmol/L (ref 98–111)
Creatinine, Ser: 13.51 mg/dL — ABNORMAL HIGH (ref 0.44–1.00)
GFR calc Af Amer: 3 mL/min — ABNORMAL LOW (ref >60)
GFR calc non Af Amer: 3 mL/min — ABNORMAL LOW (ref >60)
GLUCOSE: 97 mg/dL (ref 70–99)
Potassium: 6.2 mmol/L — ABNORMAL HIGH (ref 3.5–5.1)
Sodium: 136 mmol/L (ref 135–145)

## 2018-08-21 LAB — COMPREHENSIVE METABOLIC PANEL
ALBUMIN: 3.4 g/dL — AB (ref 3.5–5.0)
ALT: 44 U/L (ref 0–44)
ANION GAP: 18 — AB (ref 5–15)
AST: 20 U/L (ref 15–41)
Alkaline Phosphatase: 198 U/L — ABNORMAL HIGH (ref 38–126)
BUN: 133 mg/dL — ABNORMAL HIGH (ref 6–20)
CALCIUM: 8.8 mg/dL — AB (ref 8.9–10.3)
CO2: 12 mmol/L — AB (ref 22–32)
Chloride: 105 mmol/L (ref 98–111)
Creatinine, Ser: 13.29 mg/dL — ABNORMAL HIGH (ref 0.44–1.00)
GFR calc Af Amer: 3 mL/min — ABNORMAL LOW (ref >60)
GFR calc non Af Amer: 3 mL/min — ABNORMAL LOW (ref >60)
GLUCOSE: 96 mg/dL (ref 70–99)
POTASSIUM: 6.5 mmol/L — AB (ref 3.5–5.1)
SODIUM: 135 mmol/L (ref 135–145)
Total Bilirubin: 0.8 mg/dL (ref 0.3–1.2)
Total Protein: 8.3 g/dL — ABNORMAL HIGH (ref 6.5–8.1)

## 2018-08-21 LAB — CBC WITH DIFFERENTIAL/PLATELET
Abs Immature Granulocytes: 0.22 10*3/uL — ABNORMAL HIGH (ref 0.00–0.07)
BASOS ABS: 0.1 10*3/uL (ref 0.0–0.1)
Basophils Relative: 1 %
EOS PCT: 1 %
Eosinophils Absolute: 0.1 10*3/uL (ref 0.0–0.5)
HCT: 36.2 % (ref 36.0–46.0)
HEMOGLOBIN: 11.8 g/dL — AB (ref 12.0–15.0)
Immature Granulocytes: 2 %
LYMPHS PCT: 12 %
Lymphs Abs: 1.3 10*3/uL (ref 0.7–4.0)
MCH: 29.1 pg (ref 26.0–34.0)
MCHC: 32.6 g/dL (ref 30.0–36.0)
MCV: 89.2 fL (ref 80.0–100.0)
MONO ABS: 1.1 10*3/uL — AB (ref 0.1–1.0)
Monocytes Relative: 11 %
NRBC: 0 % (ref 0.0–0.2)
Neutro Abs: 7.9 10*3/uL — ABNORMAL HIGH (ref 1.7–7.7)
Neutrophils Relative %: 73 %
Platelets: 328 10*3/uL (ref 150–400)
RBC: 4.06 MIL/uL (ref 3.87–5.11)
RDW: 15.2 % (ref 11.5–15.5)
WBC: 10.7 10*3/uL — AB (ref 4.0–10.5)

## 2018-08-21 LAB — URINALYSIS, COMPLETE (UACMP) WITH MICROSCOPIC
BACTERIA UA: NONE SEEN
Bilirubin Urine: NEGATIVE
Glucose, UA: 50 mg/dL — AB
Ketones, ur: NEGATIVE mg/dL
LEUKOCYTE UA: NEGATIVE
Nitrite: NEGATIVE
PROTEIN: 100 mg/dL — AB
Specific Gravity, Urine: 1.015 (ref 1.005–1.030)
WBC, UA: >50 WBC/hpf — ABNORMAL HIGH (ref 0–5)
pH: 5 (ref 5.0–8.0)

## 2018-08-21 LAB — PHOSPHORUS: Phosphorus: 11.4 mg/dL — ABNORMAL HIGH (ref 2.5–4.6)

## 2018-08-21 LAB — CK: Total CK: 46 U/L (ref 38–234)

## 2018-08-21 MED ORDER — ACETAMINOPHEN 325 MG PO TABS
650.0000 mg | ORAL_TABLET | Freq: Four times a day (QID) | ORAL | Status: DC | PRN
  Administered 2018-08-23 – 2018-09-01 (×8): 650 mg via ORAL
  Filled 2018-08-21 (×5): qty 2

## 2018-08-21 MED ORDER — MORPHINE SULFATE (PF) 2 MG/ML IV SOLN
2.0000 mg | Freq: Once | INTRAVENOUS | Status: AC
  Administered 2018-08-21: 2 mg via INTRAVENOUS
  Filled 2018-08-21: qty 1

## 2018-08-21 MED ORDER — SODIUM CHLORIDE 0.9 % IV SOLN
INTRAVENOUS | Status: DC
  Administered 2018-08-21 – 2018-08-22 (×4): via INTRAVENOUS

## 2018-08-21 MED ORDER — OXYCODONE HCL 5 MG PO TABS
5.0000 mg | ORAL_TABLET | ORAL | Status: DC
  Filled 2018-08-21: qty 1

## 2018-08-21 MED ORDER — SODIUM CHLORIDE 0.9 % IV BOLUS
1000.0000 mL | Freq: Once | INTRAVENOUS | Status: AC
  Administered 2018-08-21: 1000 mL via INTRAVENOUS

## 2018-08-21 MED ORDER — ONDANSETRON HCL 4 MG/2ML IJ SOLN
4.0000 mg | Freq: Four times a day (QID) | INTRAMUSCULAR | Status: DC | PRN
  Administered 2018-08-21 – 2018-09-03 (×4): 4 mg via INTRAVENOUS
  Filled 2018-08-21 (×3): qty 2

## 2018-08-21 MED ORDER — DEXTROSE 50 % IV SOLN
2.0000 | Freq: Once | INTRAVENOUS | Status: AC
  Administered 2018-08-21: 100 mL via INTRAVENOUS
  Filled 2018-08-21: qty 100

## 2018-08-21 MED ORDER — POLYETHYLENE GLYCOL 3350 17 G PO PACK: 17.0000 g | PACK | Freq: Every day | ORAL | Status: DC | PRN

## 2018-08-21 MED ORDER — OXYCODONE HCL 5 MG PO TABS
2.5000 mg | ORAL_TABLET | ORAL | Status: AC
  Administered 2018-08-21: 2.5 mg via ORAL

## 2018-08-21 MED ORDER — ACETAMINOPHEN 650 MG RE SUPP: 650.0000 mg | Freq: Four times a day (QID) | RECTAL | Status: DC | PRN

## 2018-08-21 MED ORDER — SODIUM ZIRCONIUM CYCLOSILICATE 10 G PO PACK
10.0000 g | PACK | ORAL | Status: AC
  Administered 2018-08-21: 10 g via ORAL
  Filled 2018-08-21: qty 1

## 2018-08-21 MED ORDER — HYDRALAZINE HCL 20 MG/ML IJ SOLN: 10.0000 mg | Freq: Four times a day (QID) | INTRAMUSCULAR | Status: DC | PRN

## 2018-08-21 MED ORDER — SODIUM CHLORIDE 0.9 % IV SOLN
1.0000 g | Freq: Once | INTRAVENOUS | Status: AC
  Administered 2018-08-21: 1 g via INTRAVENOUS
  Filled 2018-08-21: qty 10

## 2018-08-21 MED ORDER — BUTALBITAL-APAP-CAFFEINE 50-325-40 MG PO TABS
1.0000 | ORAL_TABLET | Freq: Four times a day (QID) | ORAL | Status: DC | PRN
  Administered 2018-08-21 – 2018-09-03 (×23): 1 via ORAL
  Filled 2018-08-21 (×29): qty 1

## 2018-08-21 MED ORDER — SODIUM BICARBONATE 8.4 % IV SOLN
50.0000 meq | Freq: Once | INTRAVENOUS | Status: AC
  Administered 2018-08-21: 50 meq via INTRAVENOUS
  Filled 2018-08-21 (×2): qty 50

## 2018-08-21 MED ORDER — ONDANSETRON HCL 4 MG PO TABS: 4.0000 mg | ORAL_TABLET | Freq: Four times a day (QID) | ORAL | Status: DC | PRN

## 2018-08-21 MED ORDER — CALCIUM GLUCONATE 10 % IV SOLN
1.0000 g | Freq: Once | INTRAVENOUS | Status: AC
  Administered 2018-08-21: 1 g via INTRAVENOUS
  Filled 2018-08-21: qty 10

## 2018-08-21 MED ORDER — METOPROLOL TARTRATE 50 MG PO TABS
100.0000 mg | ORAL_TABLET | Freq: Two times a day (BID) | ORAL | Status: DC
  Administered 2018-08-21 – 2018-09-02 (×23): 100 mg via ORAL
  Filled 2018-08-21 (×24): qty 2

## 2018-08-21 MED ORDER — INSULIN ASPART 100 UNIT/ML IV SOLN
10.0000 [IU] | Freq: Once | INTRAVENOUS | Status: AC
  Administered 2018-08-21: 10 [IU] via INTRAVENOUS
  Filled 2018-08-21: qty 0.1

## 2018-08-21 MED ORDER — HEPARIN SODIUM (PORCINE) 5000 UNIT/ML IJ SOLN
5000.0000 [IU] | Freq: Three times a day (TID) | INTRAMUSCULAR | Status: DC
  Administered 2018-08-21 – 2018-08-27 (×18): 5000 [IU] via SUBCUTANEOUS
  Filled 2018-08-21 (×17): qty 1

## 2018-08-21 NOTE — ED Triage Notes (Signed)
Pt reports she is also concerned that she may have a UTI

## 2018-08-21 NOTE — ED Triage Notes (Signed)
Pt reports a headache to the back of her head for 2 weeks. Pt states he has been taking BC powder. Pt states the BC powder helps but then her HA comes back. Pt states she was seen at her MD office last week for the same and was prescribed Fioricet but it does not help. Pt denies recent injuries or trauma to her head. Pt states that this is the worse HA of her life and is also having nose bleeds now.

## 2018-08-21 NOTE — ED Provider Notes (Signed)
[facility] Emergency Department Provider Note  ____________________________________________  Time seen: Approximately 11:47 AM  I have reviewed the triage vital signs and the nursing notes.   HISTORY  Chief Complaint Headache and Epistaxis    HPI Jennifer Newman is a 53 y.o. female with a history of hypertension bipolar disorder and anxiety who complains of generalized weakness headaches and decreased urine output for the past 4 to 5 days.  She denies any pain.  She does complain of generalized headache which was not thunderclap and is waxing and waning over the past several days for which she has been taking aspirin.  Denies vision changes numbness paresthesias or focal weakness.  Denies dysuria frequency urgency.  No vomiting or diarrhea      Past Medical History:  Diagnosis Date  . Anxiety   . Bipolar disorder ([place])   . Depression   . Hypertension   . Obsessive-compulsive disorder   . PTSD (post-traumatic stress disorder)   . Tachycardia, unspecified   . Vertigo      Patient Active Problem List   Diagnosis Date Noted  . Acute kidney injury ([place]) 08/21/2018  . Tachycardia 12/12/2016     Past Surgical History:  Procedure Laterality Date  . none       Prior to Admission medications   Medication Sig Start Date End Date Taking? Authorizing Provider  butalbital-acetaminophen-caffeine (FIORICET, ESGIC) [phone] MG tablet Take 1 tablet by mouth every 4 (four) hours as needed for headache. 08/13/18 08/23/18 Yes [provider]  fluticasone (FLONASE) 50 MCG/ACT nasal spray Place 1 spray into both nostrils 2 (two) times daily. 06/09/18  Yes [provider]  metoprolol succinate (TOPROL-XL) 100 MG 24 hr tablet  10/01/17  Yes [provider]     Allergies Patient has no known allergies.   Family History  Problem Relation Age of Onset  . COPD Neg Hx   . Diabetes Neg Hx   . Hypertension Neg Hx   . CAD Neg Hx      Social History Social History   Tobacco Use  . Smoking status: Never Smoker  . Smokeless tobacco: Never Used  Substance Use Topics  . Alcohol use: Yes    Alcohol/week: 4.0 standard drinks    Types: 2 Glasses of wine, 1 Cans of beer, 1 Shots of liquor per week    Comment: occasionally  . Drug use: Yes    Types: Marijuana    Comment: Last used several weeks back     Review of Systems  Constitutional:   No fever or chills.  ENT:   No sore throat. No rhinorrhea. Cardiovascular:   No chest pain or syncope. Respiratory:   No dyspnea or cough. Gastrointestinal:   Negative for abdominal pain, vomiting and diarrhea.  Musculoskeletal:   Negative for focal pain or swelling All other systems reviewed and are negative except as documented above in ROS and HPI.  ____________________________________________   PHYSICAL EXAM:  VITAL SIGNS: ED Triage Vitals  Enc Vitals Group     BP 08/21/18 0929 (!) 159/87     Pulse Rate 08/21/18 0929 68     Resp 08/21/18 0929 20     Temp 08/21/18 0929 98.1 F (36.7 C)     Temp Source 08/21/18 0929 Oral     SpO2 08/21/18 0929 99 %     Weight 08/21/18 0928 230 lb (104.3 kg)     Height 08/21/18 0928 5\' 3"  (1.6 m)     Head Circumference --  Peak Flow --      Pain Score 08/21/18 0927 7     Pain Loc --      Pain Edu? --      Excl. in [place]? --     Vital signs reviewed, nursing assessments reviewed.   Constitutional:   Alert and oriented. Non-toxic appearance. Eyes:   Conjunctivae are normal. EOMI. PERRL. ENT      Head:   Normocephalic and atraumatic.      Nose:   No congestion/rhinnorhea.       Mouth/Throat:   MMM, no pharyngeal erythema. No peritonsillar mass.       Neck:   No meningismus. Full ROM. Hematological/Lymphatic/Immunilogical:   No cervical lymphadenopathy. Cardiovascular:   RRR. Symmetric bilateral radial and DP pulses.  No murmurs. Cap refill less than 2 seconds. Respiratory:   Normal respiratory effort without  tachypnea/retractions. Breath sounds are clear and equal bilaterally. No wheezes/rales/rhonchi. Gastrointestinal:   Soft and nontender. Non distended. There is no CVA tenderness.  No rebound, rigidity, or guarding.  Musculoskeletal:   Normal range of motion in all extremities. No joint effusions.  No lower extremity tenderness.  Trace peripheral edema. Neurologic:   Normal speech and language.  Cranial nerves III through XII intact Motor grossly intact. No acute focal neurologic deficits are appreciated.  Skin:    Skin is warm, dry and intact. No rash noted.  No petechiae, purpura, or bullae.  ____________________________________________    LABS (pertinent positives/negatives) (all labs ordered are listed, but only abnormal results are displayed) Labs Reviewed  CBC WITH DIFFERENTIAL/PLATELET - Abnormal; Notable for the following components:      Result Value   WBC 10.7 (*)    Hemoglobin 11.8 (*)    Neutro Abs 7.9 (*)    Monocytes Absolute 1.1 (*)    Abs Immature Granulocytes 0.22 (*)    All other components within normal limits  COMPREHENSIVE METABOLIC PANEL - Abnormal; Notable for the following components:   Potassium 6.5 (*)    CO2 12 (*)    BUN 133 (*)    Creatinine, Ser 13.29 (*)    Calcium 8.8 (*)    Total Protein 8.3 (*)    Albumin 3.4 (*)    Alkaline Phosphatase 198 (*)    GFR calc non Af Amer 3 (*)    GFR calc Af Amer 3 (*)    Anion gap 18 (*)    All other components within normal limits  URINALYSIS, COMPLETE (UACMP) WITH MICROSCOPIC - Abnormal; Notable for the following components:   Color, Urine YELLOW (*)    APPearance CLOUDY (*)    Glucose, UA 50 (*)    Hgb urine dipstick LARGE (*)    Protein, ur 100 (*)    RBC / HPF >50 (*)    WBC, UA >50 (*)    All other components within normal limits  URINE CULTURE  HIV ANTIBODY (ROUTINE TESTING W REFLEX)  CBC  CREATININE, SERUM   ____________________________________________   EKG  Reported by me Sinus rhythm  rate of 73, normal axis intervals QRS ST segments and T waves  ____________________________________________    RADIOLOGY  No results found.  ____________________________________________   PROCEDURES .Critical Care Performed by: Carrie Mew, MD Authorized by: Carrie Mew, MD   Critical care provider statement:    Critical care time (minutes):  35   Critical care time was exclusive of:  Separately billable procedures and treating other patients   Critical care was necessary to treat or  prevent imminent or life-threatening deterioration of the following conditions:  Metabolic crisis and cardiac failure   Critical care was time spent personally by me on the following activities:  Development of treatment plan with patient or surrogate, discussions with consultants, evaluation of patient's response to treatment, examination of patient, obtaining history from patient or surrogate, ordering and performing treatments and interventions, ordering and review of laboratory studies, ordering and review of radiographic studies, pulse oximetry, re-evaluation of patient's condition and review of old charts    ____________________________________________    CLINICAL IMPRESSION / ASSESSMENT AND PLAN / ED COURSE  Medications ordered in the ED: Medications  sodium chloride 0.9 % bolus 1,000 mL (1,000 mLs Intravenous New Bag/Given 08/21/18 1107)  hydrALAZINE (APRESOLINE) injection 10 mg (has no administration in time range)  heparin injection 5,000 Units (has no administration in time range)  0.9 %  sodium chloride infusion (has no administration in time range)  acetaminophen (TYLENOL) tablet 650 mg (has no administration in time range)    Or  acetaminophen (TYLENOL) suppository 650 mg (has no administration in time range)  polyethylene glycol (MIRALAX / GLYCOLAX) packet 17 g (has no administration in time range)  ondansetron (ZOFRAN) tablet 4 mg (has no administration in time range)     Or  ondansetron (ZOFRAN) injection 4 mg (has no administration in time range)  metoprolol tartrate (LOPRESSOR) tablet 100 mg (has no administration in time range)  calcium gluconate inj 10% (1 g) URGENT USE ONLY! (1 g Intravenous Given 08/21/18 1109)  sodium bicarbonate injection 50 mEq (50 mEq Intravenous Given 08/21/18 1112)  sodium zirconium cyclosilicate (LOKELMA) packet 10 g (10 g Oral Given 08/21/18 1129)  insulin aspart (novoLOG) injection 10 Units (10 Units Intravenous Given 08/21/18 1126)    And  dextrose 50 % solution 100 mL (100 mLs Intravenous Given 08/21/18 1121)  cefTRIAXone (ROCEPHIN) 1 g in sodium chloride 0.9 % 100 mL IVPB (1 g Intravenous New Bag/Given 08/21/18 1116)    Pertinent labs & imaging results that were available during my care of the patient were reviewed by me and considered in my medical decision making (see chart for details).    Patient presents with generalized weakness, found on labs to have acute renal failure with a creatinine of 13 compared to a baseline of 0.5.  With this she has acute hyperkalemia with a potassium of 6.5.  EKG is unremarkable.  She does endorse some orthopnea and does have trace peripheral edema suggestive of mild volume overload.  I will get a chest x-ray to evaluate for pulmonary edema or pleural effusion.  Case discussed with Dr. Candiss Norse of nephrology who recommends noncontrast CT scan of the abdomen.  Also obtain a CT scan of the head due to her headaches and concern for intracranial hemorrhage with her hypertension.  Case discussed with Dr. Genia Harold of hospitalist for further evaluation and management.  Patient given insulin glucose bicarb calcium and Lokelma for hyperkalemia.  IV fluids for hydration.      ____________________________________________   FINAL CLINICAL IMPRESSION(S) / ED DIAGNOSES    Final diagnoses:  Acute renal failure, unspecified acute renal failure type [facility])  Hyperkalemia     ED Discharge Orders    None       Portions of this note were generated with dragon dictation software. Dictation errors may occur despite best attempts at proofreading.   Carrie Mew, MD 08/21/18 1157

## 2018-08-21 NOTE — ED Notes (Signed)
Potassium 6.5 received from [place] in Lab. Karl Ito Nurse notified.  Patient to 1H.

## 2018-08-21 NOTE — Consult Note (Signed)
Date: 08/21/2018                  Patient Name:  Jennifer Newman  MRN: [id number]  DOB: [birth year]  Age / Sex: 53 y.o., female         PCP: Barbaraann Boys, MD                 Service Requesting Consult: IM/ Bettey Costa, MD                 Reason for Consult: ARF            History of Present Illness: Patient is a 53 y.o. female with medical problems of bipolar disorder, hypertension, depression, who was admitted to [facility] on 08/21/2018 for evaluation of severe headaches.  Patient reports that she has been having headaches for about 2 weeks.  She has been taking BC powders twice a day.  She reports at home feeling cold chills, generalized body achiness.  She feels her neck is tight.  She has not been able to sleep well.  Reports that her appetite has been poor last 2 to 3 days.  She has some nausea and vomiting at home.  Last episode was earlier today.  She states she ate some food in the emergency room now that she feels a little better.  She also reports diarrhea.  She reports that she has not had any urine output for the past 4 days.  Denies any blood in the urine Evaluation in the emergency room showed severe hyperkalemia with potassium severe acute renal failure with creatinine of 13.3.  Blood pressure is elevated at 181/97 Baseline creatinine from December 14, 2016 was 0.55   Medications: Outpatient medications: (Not in a hospital admission)   Current medications: Current Facility-Administered Medications  Medication Dose Route Frequency Provider Last Rate Last Dose  . 0.9 %  sodium chloride infusion   Intravenous Continuous Bettey Costa, MD 125 mL/hr at 08/21/18 1227    . acetaminophen (TYLENOL) tablet 650 mg  650 mg Oral Q6H PRN Bettey Costa, MD       Or  . acetaminophen (TYLENOL) suppository 650 mg  650 mg Rectal Q6H PRN Mody, Sital, MD      . heparin injection 5,000 Units  5,000 Units Subcutaneous Q8H Mody, Sital, MD   5,000 Units at 08/21/18 1436  . hydrALAZINE (APRESOLINE)  injection 10 mg  10 mg Intravenous Q6H PRN Mody, Sital, MD      . metoprolol tartrate (LOPRESSOR) tablet 100 mg  100 mg Oral BID Bettey Costa, MD   100 mg at 08/21/18 1225  . ondansetron (ZOFRAN) tablet 4 mg  4 mg Oral Q6H PRN Bettey Costa, MD       Or  . ondansetron (ZOFRAN) injection 4 mg  4 mg Intravenous Q6H PRN Bettey Costa, MD   4 mg at 08/21/18 1434  . polyethylene glycol (MIRALAX / GLYCOLAX) packet 17 g  17 g Oral Daily PRN Bettey Costa, MD       Current Outpatient Medications  Medication Sig Dispense Refill  . butalbital-acetaminophen-caffeine (FIORICET, ESGIC) 50-325-40 MG tablet Take 1 tablet by mouth every 4 (four) hours as needed for headache.    . fluticasone (FLONASE) 50 MCG/ACT nasal spray Place 1 spray into both nostrils 2 (two) times daily.    . metoprolol succinate (TOPROL-XL) 100 MG 24 hr tablet         Allergies: No Known Allergies    Past Medical History: Past  Medical History:  Diagnosis Date  . Anxiety   . Bipolar disorder ([place])   . Depression   . Hypertension   . Obsessive-compulsive disorder   . PTSD (post-traumatic stress disorder)   . Tachycardia, unspecified   . Vertigo      Past Surgical History: Past Surgical History:  Procedure Laterality Date  . none       Family History: Family History  Problem Relation Age of Onset  . COPD Neg Hx   . Diabetes Neg Hx   . Hypertension Neg Hx   . CAD Neg Hx      Social History: Social History   Socioeconomic History  . Marital status: Married    Spouse name: Corene Cornea  . Number of children: 2  . Years of education: Not on file  . Highest education level: Associate degree: occupational, Hotel manager, or vocational program  Occupational History  . 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 Financial resource strain: Not hard at all  . Food insecurity:    Worry: Never true    Inability: Never true  . Transportation needs:    Medical: No     Non-medical: No  Tobacco Use  . Smoking status: Never Smoker  . Smokeless tobacco: Never Used  Substance and Sexual Activity  . Alcohol use: Yes    Alcohol/week: 1.0 standard drinks    Types: 1 Shots of liquor per week    Comment: occasionally  . Drug use: Not Currently    Types: Marijuana    Comment: Last used several weeks back   . Sexual activity: Not Currently  Lifestyle  . Physical activity:    Days per week: 0 days    Minutes per session: 0 min  . Stress: Rather much  Relationships  . Social connections:    Talks on phone: Never    Gets together: Never    Attends religious service: Never    Active member of club or organization: No    Attends meetings of clubs or organizations: Never    Relationship status: Married  . Intimate partner violence:    Fear of current or ex partner: No    Emotionally abused: No    Physically abused: No    Forced sexual activity: No  Other Topics Concern  . Not on file  Social History Narrative  . Not on file     Review of Systems: Gen: Reports headache for the past 2 weeks HEENT: Denies any blurry vision or problems with vision CV: No chest pain or shortness of breath Resp: No cough or sputum production GI: Nausea, vomiting, diarrhea.  Decreased appetite GU : Decreased urine output.  Denies any blood in the urine MS: Generalized achiness, neck tightness Derm:  No rashes Psych: No complaints Heme: No complaints Neuro: Headaches as described above Endocrine.  No complaints  Vital Signs: Blood pressure (!) 149/80, pulse 67, temperature (!) 97.5 F (36.4 C), temperature source Oral, resp. rate 20, height 5\' 3"  (1.6 m), weight 104.3 kg, last menstrual period 04/09/2018, SpO2 98 %.  No intake or output data in the 24 hours ending 08/21/18 1705  Weight trends: Filed Weights   08/21/18 2992  Weight: 104.3 kg    Physical Exam: General:  Obese female, laying in the bed, no acute distress  HEENT  anicteric sclera, dry oral  mucous membranes  Neck:  Unable to even touch her chin to her chest due to neck  pain  Lungs:  Normal breathing effort, clear to auscultation  Heart::  Regular rhythm, no rub  Abdomen:  Soft, obese, nontender  Extremities:  1 to 2 + peripheral edema bilaterally  Neurologic:  Alert, oreinted  Skin:  No acute rashs  Foley:  in place       Lab results: Basic Metabolic Panel: Recent Labs  Lab 08/21/18 0934  NA 135  K 6.5*  CL 105  CO2 12*  GLUCOSE 96  BUN 133*  CREATININE 13.29*  CALCIUM 8.8*    Liver Function Tests: Recent Labs  Lab 08/21/18 0934  AST 20  ALT 44  ALKPHOS 198*  BILITOT 0.8  PROT 8.3*  ALBUMIN 3.4*   No results for input(s): LIPASE, AMYLASE in the last 168 hours. No results for input(s): AMMONIA in the last 168 hours.  CBC: Recent Labs  Lab 08/21/18 0934  WBC 10.7*  NEUTROABS 7.9*  HGB 11.8*  HCT 36.2  MCV 89.2  PLT 328    Cardiac Enzymes: No results for input(s): CKTOTAL, TROPONINI in the last 168 hours.  BNP: Invalid input(s): POCBNP  CBG: No results for input(s): GLUCAP in the last 168 hours.  Microbiology: No results found for this or any previous visit (from the past 720 hour(s)).   Coagulation Studies: No results for input(s): LABPROT, INR in the last 72 hours.  Urinalysis: Recent Labs    08/21/18 0934  COLORURINE YELLOW*  LABSPEC 1.015  PHURINE 5.0  GLUCOSEU 50*  HGBUR LARGE*  BILIRUBINUR NEGATIVE  KETONESUR NEGATIVE  PROTEINUR 100*  NITRITE NEGATIVE  LEUKOCYTESUR NEGATIVE        Imaging: Ct Head Wo Contrast  Result Date: 08/21/2018 CLINICAL DATA:  53 year old female with bipolar disorder and headaches EXAM: CT HEAD WITHOUT CONTRAST TECHNIQUE: Contiguous axial images were obtained from the base of the skull through the vertex without intravenous contrast. COMPARISON:  12/13/2016 FINDINGS: Brain: No acute intracranial hemorrhage. No midline shift or mass effect. Gray-white differentiation maintained.  Unremarkable appearance of the ventricular system. Vascular: Unremarkable. Skull: No acute fracture.  No aggressive bone lesion identified. Sinuses/Orbits: Unremarkable appearance of the orbits. Mastoid air cells clear. No middle ear effusion. No significant sinus disease. Other: None IMPRESSION: Negative head CT Electronically Signed   By: Corrie Mckusick D.O.   On: 08/21/2018 12:40   Dg Chest Portable 1 View  Result Date: 08/21/2018 CLINICAL DATA:  Headache. EXAM: PORTABLE CHEST 1 VIEW COMPARISON:  12/13/2016 FINDINGS: The heart is enlarged but appears stable. Slightly prominent mediastinal and hilar contours are unchanged. No acute pulmonary findings. No infiltrates or effusions. The bony thorax is intact. IMPRESSION: Stable cardiac enlargement.  No acute pulmonary findings. Electronically Signed   By: Marijo Sanes M.D.   On: 08/21/2018 12:18   Ct Renal Stone Study  Result Date: 08/21/2018 CLINICAL DATA:  KOLBY MYUNG is a 53 y.o. female with a history of hypertension bipolar disorder and anxiety who complains of generalized weakness headaches and decreased urine output for the past 4 to 5 days. EXAM: CT ABDOMEN AND PELVIS WITHOUT CONTRAST TECHNIQUE: Multidetector CT imaging of the abdomen and pelvis was performed following the standard protocol without IV contrast. COMPARISON:  CT of the chest on 12/12/2016 FINDINGS: Lower chest: There is focal subsegmental atelectasis at the lung bases. The heart size is normal. Hepatobiliary: There is diffuse low-attenuation of the liver consistent with hepatic steatosis. Gallbladder is present and normal in CT appearance. Pancreas: Unremarkable. No pancreatic ductal dilatation or surrounding inflammatory changes. Spleen: No  splenic injury or perisplenic hematoma. Adrenals/Urinary Tract: Adrenal glands are unremarkable. Kidneys are normal, without renal calculi, focal lesion, or hydronephrosis. Bladder is unremarkable. Stomach/Bowel: The stomach is unremarkable.  Small bowel loops are normal in caliber. No bowel wall thickening or dilatation. The appendix is well seen and has a normal appearance. Loops of colon are normal in appearance. Vascular/Lymphatic: There is minimal atherosclerotic calcification of the abdominal aorta. No associated aneurysm. Reproductive: Uterus is enlarged. No adnexal mass. Other: No free pelvic fluid. Anterior abdominal wall is unremarkable. Musculoskeletal: No acute or significant osseous findings. IMPRESSION: 1. No acute abnormality of the abdomen or pelvis. 2. Hepatic steatosis. 3. Normal appendix. Aortic Atherosclerosis (ICD10-I70.0). Electronically Signed   By: Nolon Nations M.D.   On: 08/21/2018 12:45      Assessment & Plan: Pt is a 53 y.o. Caucasian  female with medical problems of bipolar disorder pretension, depression, anxiety, was admitted on 08/21/2018 with 2 weeks of headache, acute renal failure, severe hyperkalemia.   Acute kidney injury with severe hyperkalemia, oliguric -Cause of acute kidney injury is unclear.  Patient's baseline creatinine is 0.55 on December 14, 2016.  Renal imaging in the form of CT without IV contrast is negative for obstruction or stone.  It is likely patient developed severe ATN from volume depletion over the past few days due to decreased oral intake and nausea vomiting and diarrhea.  In addition, she was taking nonsteroidals regularly in the form of BC powders twice a day for chronic headaches.  Plan to treat hyperkalemia with shifting measures, Lokelma  agree with IV hydration Repeat renal panel tonight No acute indication for hemodialysis at present but will monitor closely  Proteinuria, hematuria, pyuria noted on urinalysis Likely UTI Will obtain urine protein to creatinine ratio We will also order screening serologies        LOS: 0 Gissele Narducci 2/19/20205:05 PM  [place], [place]  Note: This note was prepared with Dragon  dictation. Any transcription errors are unintentional

## 2018-08-21 NOTE — ED Notes (Signed)
Pts family to bedside to verbalize increased headache. Requesting pain medication other than tylenol.

## 2018-08-21 NOTE — H&P (Signed)
[facility] [place] NAME: Jennifer Newman    MR#:  [id number]  DATE OF BIRTH:  Mar 19, 1966  DATE OF ADMISSION:  08/21/2018  PRIMARY CARE PHYSICIAN: Barbaraann Boys, MD   REQUESTING/REFERRING PHYSICIAN: dr Joni Fears  CHIEF COMPLAINT:   weakness HISTORY OF PRESENT ILLNESS:  Jennifer Newman  is a 53 y.o. female with a known history of HTN and bipolar who presents to ED complaining of headache and generalized weakness. She saw her primary care physician last week for a headache and was given Fioricet.  She denies that this is the worst headache of her life.  She denies photophobia, neck stiffness, nausea or vomiting.  She does have lower extremity edema but denies PND but does endorse shortness of breath.  In the emergency room it is noted that she has a creatinine of 13 and a potassium of 6.5.   Patient was given calcium gluconate, Lokelma and sodium bicarb.  ER physician will contact nephrologist for possible dialysis. Patient is reporting she has had no urine output for the last 4 days.  PAST MEDICAL HISTORY:   Past Medical History:  Diagnosis Date  . Anxiety   . Bipolar disorder ([place])   . Depression   . Hypertension   . Obsessive-compulsive disorder   . PTSD (post-traumatic stress disorder)   . Tachycardia, unspecified   . Vertigo     PAST SURGICAL HISTORY:   Past Surgical History:  Procedure Laterality Date  . none      SOCIAL HISTORY:   Social History   Tobacco Use  . Smoking status: Never Smoker  . Smokeless tobacco: Never Used  Substance Use Topics  . Alcohol use: Yes    Alcohol/week: 4.0 standard drinks    Types: 2 Glasses of wine, 1 Cans of beer, 1 Shots of liquor per week    Comment: occasionally    FAMILY HISTORY:   Family History  Problem Relation Age of Onset  . COPD Neg Hx   . Diabetes Neg Hx   . Hypertension Neg Hx   . CAD Neg Hx     DRUG ALLERGIES:  No Known Allergies  REVIEW OF SYSTEMS:   Review  of Systems  Constitutional: Positive for malaise/fatigue. Negative for chills and fever.  HENT: Negative.  Negative for ear discharge, ear pain, hearing loss, nosebleeds and sore throat.   Eyes: Negative.  Negative for blurred vision and pain.  Respiratory: Positive for shortness of breath. Negative for cough, hemoptysis and wheezing.   Cardiovascular: Positive for leg swelling. Negative for chest pain and palpitations.  Gastrointestinal: Negative.  Negative for abdominal pain, blood in stool, diarrhea, nausea and vomiting.  Genitourinary: Negative.  Negative for dysuria.       No urine output for 4 days  Musculoskeletal: Negative.  Negative for back pain.  Skin: Negative.   Neurological: Positive for headaches. Negative for dizziness, tremors, speech change, focal weakness and seizures.  Endo/Heme/Allergies: Negative.  Does not bruise/bleed easily.  Psychiatric/Behavioral: Negative.  Negative for depression, hallucinations and suicidal ideas.       Bipolar disorder however she is not taking any medications    MEDICATIONS AT HOME:   Prior to Admission medications   Medication Sig Start Date End Date Taking? Authorizing Provider  DULoxetine (CYMBALTA) 30 MG capsule TAKE 1 CAPSULE BY MOUTH EVERY DAY 01/04/18   Ursula Alert, MD  hydrOXYzine (VISTARIL) 25 MG capsule Take 1-2 capsules (25-50 mg total) by mouth 2 (two) times daily as  needed (and also as needed for sleep at bedtime). 10/04/17   Eappen, Ria Clock, MD  LATUDA 60 MG TABS TAKE 1 TABLET (60 MG TOTAL) BY MOUTH DAILY WITH SUPPER. 01/04/18   Ursula Alert, MD  loratadine (CLARITIN) 10 MG tablet Take 1 tablet by mouth daily. 03/03/14   [provider]  metoprolol succinate (TOPROL-XL) 100 MG 24 hr tablet  10/01/17   [provider]      VITAL SIGNS:  Blood pressure (!) 181/97, pulse 70, temperature 98.1 F (36.7 C), temperature source Oral, resp. rate (!) 21, height 5\' 3"  (1.6 m), weight 104.3 kg, last menstrual period  04/09/2018, SpO2 98 %.  PHYSICAL EXAMINATION:   Physical Exam Constitutional:      General: She is not in acute distress.    Appearance: She is obese.  HENT:     Head: Normocephalic.  Eyes:     General: No scleral icterus. Neck:     Musculoskeletal: Normal range of motion and neck supple.     Vascular: No JVD.     Trachea: No tracheal deviation.  Cardiovascular:     Rate and Rhythm: Normal rate and regular rhythm.     Heart sounds: Normal heart sounds. No murmur. No friction rub. No gallop.   Pulmonary:     Effort: Pulmonary effort is normal. No respiratory distress.     Breath sounds: Normal breath sounds. No wheezing or rales.  Chest:     Chest wall: No tenderness.  Abdominal:     General: Bowel sounds are normal. There is no distension.     Palpations: Abdomen is soft. There is no mass.     Tenderness: There is no abdominal tenderness. There is no guarding or rebound.  Musculoskeletal: Normal range of motion.        General: Swelling and tenderness present.  Skin:    General: Skin is warm.     Findings: No erythema or rash.  Neurological:     Mental Status: She is alert and oriented to person, place, and time.     Cranial Nerves: Cranial nerve deficit present.     Sensory: No sensory deficit.  Psychiatric:        Mood and Affect: Mood normal. Mood is not depressed.        Speech: Speech normal.        Behavior: Behavior normal.        Judgment: Judgment normal.       LABORATORY PANEL:   CBC Recent Labs  Lab 08/21/18 0934  WBC 10.7*  HGB 11.8*  HCT 36.2  PLT 328   ------------------------------------------------------------------------------------------------------------------  Chemistries  Recent Labs  Lab 08/21/18 0934  NA 135  K 6.5*  CL 105  CO2 12*  GLUCOSE 96  BUN 133*  CREATININE 13.29*  CALCIUM 8.8*  AST 20  ALT 44  ALKPHOS 198*  BILITOT 0.8    ------------------------------------------------------------------------------------------------------------------  Cardiac Enzymes No results for input(s): TROPONINI in the last 168 hours. ------------------------------------------------------------------------------------------------------------------  RADIOLOGY:  No results found.  EKG:  Normal sinus rhythm no ST elevation or depression no peak T waves  IMPRESSION AND PLAN:   53 year old female with history of bipolar not on any medications and essential hypertension who presents to the emergency room due to headache and generalized weakness and found to have acute kidney injury with hyperkalemia.  1.  Acute kidney injury (symptomatic with shortness of breath, lower extremity edema and hyperkalemia: ER physician is speaking with nephrology.  Patient may need dialysis  given the symptoms. Bladder scan and renal ultrasound have been ordered No nephrotoxic medications Further work-up per nephrology Start normal saline 125 an hour Echocardiogram ordered for anasarca.  2.  Hyperkalemia due to acute kidney injury: Patient given medications in the emergency room. Repeat potassium Further recommendations after nephrology official consultation  3.  Bipolar affective disorder: Currently not on any medications and mood is stable at this time.  4.  Essential hypertension: Hydralazine PRN with metoprolol  5.  Headache: (no menigeal signs) Try Fioricet Order head CT. BP elevated which may contribute to headache.   I ordered her patient dose of metoprolol.   6. Hematuria in UA: Ct abdomen ordered      All the records are reviewed and case discussed with ED provider. Management plans discussed with the patient and she is in agreement  CODE STATUS: full  TOTAL TIME TAKING CARE OF THIS PATIENT: 50 minutes.    Iker Nuttall M.D on 08/21/2018 at 11:40 AM  Between 7am to 6pm - Pager - [phone]  After 6pm go to  [email or web address] - password [email or web address] [facility]  Office  [phone]  CC: Primary care physician; Barbaraann Boys, MD

## 2018-08-21 NOTE — ED Notes (Signed)
ED TO INPATIENT HANDOFF REPORT  ED Nurse Name and Phone #: [phone]   S Name/Age/Gender Jennifer Newman 53 y.o. female Room/Bed: ED02A/ED02A  Code Status   Code Status: Full Code  Home/SNF/Other Home Patient oriented to: self, place, time and situation Is this baseline? Yes   Triage Complete: Triage complete  Chief Complaint nosebleeds headache nausea  Triage Note Pt reports a headache to the back of her head for 2 weeks. Pt states he has been taking BC powder. Pt states the BC powder helps but then her HA comes back. Pt states she was seen at her MD office last week for the same and was prescribed Fioricet but it does not help. Pt denies recent injuries or trauma to her head. Pt states that this is the worse HA of her life and is also having nose bleeds now.   Pt reports she is also concerned that she may have a UTI   Allergies No Known Allergies  Level of Care/Admitting Diagnosis ED Disposition    ED Disposition Condition [place]: [facility] [[id number]]  Level of Care: Med-Surg [16]  Diagnosis: Acute kidney injury [facility]) [[id number]]  Admitting Physician: MODYUlice Bold [[id number]]  Attending Physician: MODY, Ulice Bold [[id number]]  Estimated length of stay: 3 - 4 days  Certification:: I certify this patient will need inpatient services for at least 2 midnights  PT Class (Do Not Modify): Inpatient [101]  PT Acc Code (Do Not Modify): Private [1]       B Medical/Surgery History Past Medical History:  Diagnosis Date  . Anxiety   . Bipolar disorder ([place])   . Depression   . Hypertension   . Obsessive-compulsive disorder   . PTSD (post-traumatic stress disorder)   . Tachycardia, unspecified   . Vertigo    Past Surgical History:  Procedure Laterality Date  . none       A IV Location/Drains/Wounds Patient Lines/Drains/Airways Status   Active Line/Drains/Airways    Name:   Placement date:   Placement time:   Site:   Days:    Peripheral IV 08/21/18 Left Arm   08/21/18    1056    Arm   less than 1   Peripheral IV 08/21/18 Right Arm   08/21/18    1120    Arm   less than 1          Intake/Output Last 24 hours No intake or output data in the 24 hours ending 08/21/18 1825  Labs/Imaging Results for orders placed or performed during the hospital encounter of 08/21/18 (from the past 48 hour(s))  CBC with Differential     Status: Abnormal   Collection Time: 08/21/18  9:34 AM  Result Value Ref Range   WBC 10.7 (H) 4.0 - 10.5 K/uL   RBC 4.06 3.87 - 5.11 MIL/uL   Hemoglobin 11.8 (L) 12.0 - 15.0 g/dL   HCT 36.2 36.0 - 46.0 %   MCV 89.2 80.0 - 100.0 fL   MCH 29.1 26.0 - 34.0 pg   MCHC 32.6 30.0 - 36.0 g/dL   RDW 15.2 11.5 - 15.5 %   Platelets 328 150 - 400 K/uL   nRBC 0.0 0.0 - 0.2 %   Neutrophils Relative % 73 %   Neutro Abs 7.9 (H) 1.7 - 7.7 K/uL   Lymphocytes Relative 12 %   Lymphs Abs 1.3 0.7 - 4.0 K/uL   Monocytes Relative 11 %   Monocytes Absolute 1.1 (H) 0.1 -  1.0 K/uL   Eosinophils Relative 1 %   Eosinophils Absolute 0.1 0.0 - 0.5 K/uL   Basophils Relative 1 %   Basophils Absolute 0.1 0.0 - 0.1 K/uL   Immature Granulocytes 2 %   Abs Immature Granulocytes 0.22 (H) 0.00 - 0.07 K/uL    Comment: Performed at [facility], [address] [place], [place] [address]  Comprehensive metabolic panel     Status: Abnormal   Collection Time: 08/21/18  9:34 AM  Result Value Ref Range   Sodium 135 135 - 145 mmol/L   Potassium 6.5 (HH) 3.5 - 5.1 mmol/L    Comment: CRITICAL RESULT CALLED TO, READ BACK BY AND VERIFIED WITH KAREN KUBECKA AT 1040 ON 08/21/2018 JJB    Chloride 105 98 - 111 mmol/L   CO2 12 (L) 22 - 32 mmol/L   Glucose, Bld 96 70 - 99 mg/dL   BUN 133 (H) 6 - 20 mg/dL    Comment: RESULT CONFIRMED BY MANUAL DILUTION JJB   Creatinine, Ser 13.29 (H) 0.44 - 1.00 mg/dL   Calcium 8.8 (L) 8.9 - 10.3 mg/dL   Total Protein 8.3 (H) 6.5 - 8.1 g/dL   Albumin 3.4 (L) 3.5 - 5.0 g/dL   AST 20 15 - 41  U/L   ALT 44 0 - 44 U/L   Alkaline Phosphatase 198 (H) 38 - 126 U/L   Total Bilirubin 0.8 0.3 - 1.2 mg/dL   GFR calc non Af Amer 3 (L) >60 mL/min   GFR calc Af Amer 3 (L) >60 mL/min   Anion gap 18 (H) 5 - 15    Comment: Performed at [facility], [address] [place], [place] [address]  Urinalysis, Complete w Microscopic     Status: Abnormal   Collection Time: 08/21/18  9:34 AM  Result Value Ref Range   Color, Urine YELLOW (A) YELLOW   APPearance CLOUDY (A) CLEAR   Specific Gravity, Urine 1.015 1.005 - 1.030   pH 5.0 5.0 - 8.0   Glucose, UA 50 (A) NEGATIVE mg/dL   Hgb urine dipstick LARGE (A) NEGATIVE   Bilirubin Urine NEGATIVE NEGATIVE   Ketones, ur NEGATIVE NEGATIVE mg/dL   Protein, ur 100 (A) NEGATIVE mg/dL   Nitrite NEGATIVE NEGATIVE   Leukocytes,Ua NEGATIVE NEGATIVE   RBC / HPF >50 (H) 0 - 5 RBC/hpf   WBC, UA >50 (H) 0 - 5 WBC/hpf   Bacteria, UA NONE SEEN NONE SEEN   Squamous Epithelial / LPF 11-20 0 - 5   Budding Yeast PRESENT     Comment: Performed at [facility], [address] [place], [place] [address]   Ct Head Wo Contrast  Result Date: 08/21/2018 CLINICAL DATA:  53 year old female with bipolar disorder and headaches EXAM: CT HEAD WITHOUT CONTRAST TECHNIQUE: Contiguous axial images were obtained from the base of the skull through the vertex without intravenous contrast. COMPARISON:  12/13/2016 FINDINGS: Brain: No acute intracranial hemorrhage. No midline shift or mass effect. Gray-white differentiation maintained. Unremarkable appearance of the ventricular system. Vascular: Unremarkable. Skull: No acute fracture.  No aggressive bone lesion identified. Sinuses/Orbits: Unremarkable appearance of the orbits. Mastoid air cells clear. No middle ear effusion. No significant sinus disease. Other: None IMPRESSION: Negative head CT Electronically Signed   By: Corrie Mckusick D.O.   On: 08/21/2018 12:40   Dg Chest Portable 1 View  Result Date:  08/21/2018 CLINICAL DATA:  Headache. EXAM: PORTABLE CHEST 1 VIEW COMPARISON:  12/13/2016 FINDINGS: The heart is enlarged but  appears stable. Slightly prominent mediastinal and hilar contours are unchanged. No acute pulmonary findings. No infiltrates or effusions. The bony thorax is intact. IMPRESSION: Stable cardiac enlargement.  No acute pulmonary findings. Electronically Signed   By: Marijo Sanes M.D.   On: 08/21/2018 12:18   Ct Renal Stone Study  Result Date: 08/21/2018 CLINICAL DATA:  LUCIELLE VOKES is a 53 y.o. female with a history of hypertension bipolar disorder and anxiety who complains of generalized weakness headaches and decreased urine output for the past 4 to 5 days. EXAM: CT ABDOMEN AND PELVIS WITHOUT CONTRAST TECHNIQUE: Multidetector CT imaging of the abdomen and pelvis was performed following the standard protocol without IV contrast. COMPARISON:  CT of the chest on 12/12/2016 FINDINGS: Lower chest: There is focal subsegmental atelectasis at the lung bases. The heart size is normal. Hepatobiliary: There is diffuse low-attenuation of the liver consistent with hepatic steatosis. Gallbladder is present and normal in CT appearance. Pancreas: Unremarkable. No pancreatic ductal dilatation or surrounding inflammatory changes. Spleen: No splenic injury or perisplenic hematoma. Adrenals/Urinary Tract: Adrenal glands are unremarkable. Kidneys are normal, without renal calculi, focal lesion, or hydronephrosis. Bladder is unremarkable. Stomach/Bowel: The stomach is unremarkable. Small bowel loops are normal in caliber. No bowel wall thickening or dilatation. The appendix is well seen and has a normal appearance. Loops of colon are normal in appearance. Vascular/Lymphatic: There is minimal atherosclerotic calcification of the abdominal aorta. No associated aneurysm. Reproductive: Uterus is enlarged. No adnexal mass. Other: No free pelvic fluid. Anterior abdominal wall is unremarkable. Musculoskeletal: No  acute or significant osseous findings. IMPRESSION: 1. No acute abnormality of the abdomen or pelvis. 2. Hepatic steatosis. 3. Normal appendix. Aortic Atherosclerosis (ICD10-I70.0). Electronically Signed   By: Nolon Nations M.D.   On: 08/21/2018 12:45    Pending Labs Unresulted Labs (From admission, onward)    Start     Ordered   08/22/18 4315  Basic metabolic panel  Tomorrow morning,   STAT     08/21/18 1139   08/22/18 0500  Protein / creatinine ratio, urine  Tomorrow morning,   STAT     08/21/18 1718   08/22/18 0500  ANA w/Reflex if Positive  Tomorrow morning,   STAT     08/21/18 1718   08/22/18 0500  ANCA TITERS  Tomorrow morning,   STAT     08/21/18 1718   08/22/18 0500  Protein Electro, Random Urine  Tomorrow morning,   STAT     08/21/18 1718   08/21/18 4008  Basic metabolic panel  Once,   STAT     08/21/18 1719   08/21/18 1720  CK  Add-on,   AD     08/21/18 1719   08/21/18 1719  Phosphorus  Add-on,   AD     08/21/18 1719   08/21/18 1718  Hepatitis B surface antigen  Once,   STAT     08/21/18 1718   08/21/18 1718  Hepatitis B surface antibody,qualitative  Once,   STAT     08/21/18 1718   08/21/18 1718  Hepatitis c antibody (reflex)  Once,   STAT     08/21/18 1718   08/21/18 1718  Hepatitis B core antibody, total  Once,   STAT     08/21/18 1718   08/21/18 1718  Glomerular basement membrane antibodies  Once,   STAT     08/21/18 1718   08/21/18 1718  C3 complement  Once,   STAT     08/21/18 1718  08/21/18 1718  C4 complement  Once,   STAT     08/21/18 1718   08/21/18 1718  Protein electrophoresis, serum  Once,   STAT     08/21/18 1718   08/21/18 1718  Kappa/lambda light chains  Once,   STAT     08/21/18 1718   08/21/18 1345  MRSA PCR Screening  Once,   STAT     08/21/18 1345   08/21/18 1139  HIV antibody (Routine Testing)  Once,   STAT     08/21/18 1139   08/21/18 1058  Urine Culture  Add-on,   AD     08/21/18 1058          Vitals/Pain Today's Vitals    08/21/18 1630 08/21/18 1700 08/21/18 1730 08/21/18 1800  BP: (!) 157/78 (!) 144/76 (!) 151/77 (!) 150/72  Pulse: 70 71 71   Resp: (!) 23 19 (!) 22   Temp:      TempSrc:      SpO2: 97% 99% 98%   Weight:      Height:      PainSc:        Isolation Precautions No active isolations  Medications Medications  hydrALAZINE (APRESOLINE) injection 10 mg (has no administration in time range)  heparin injection 5,000 Units (5,000 Units Subcutaneous Given 08/21/18 1436)  0.9 %  sodium chloride infusion ( Intravenous New Bag/Given 08/21/18 1227)  acetaminophen (TYLENOL) tablet 650 mg (has no administration in time range)    Or  acetaminophen (TYLENOL) suppository 650 mg (has no administration in time range)  polyethylene glycol (MIRALAX / GLYCOLAX) packet 17 g (has no administration in time range)  ondansetron (ZOFRAN) tablet 4 mg ( Oral See Alternative 08/21/18 1434)    Or  ondansetron (ZOFRAN) injection 4 mg (4 mg Intravenous Given 08/21/18 1434)  metoprolol tartrate (LOPRESSOR) tablet 100 mg (100 mg Oral Given 08/21/18 1225)  sodium chloride 0.9 % bolus 1,000 mL (0 mLs Intravenous Stopped 08/21/18 1223)  calcium gluconate inj 10% (1 g) URGENT USE ONLY! (1 g Intravenous Given 08/21/18 1109)  sodium bicarbonate injection 50 mEq (50 mEq Intravenous Given 08/21/18 1112)  sodium zirconium cyclosilicate (LOKELMA) packet 10 g (10 g Oral Given 08/21/18 1129)  insulin aspart (novoLOG) injection 10 Units (10 Units Intravenous Given 08/21/18 1126)    And  dextrose 50 % solution 100 mL (100 mLs Intravenous Given 08/21/18 1121)  cefTRIAXone (ROCEPHIN) 1 g in sodium chloride 0.9 % 100 mL IVPB (0 g Intravenous Stopped 08/21/18 1223)  morphine 2 MG/ML injection 2 mg (2 mg Intravenous Given 08/21/18 1435)  oxyCODONE (Oxy IR/ROXICODONE) immediate release tablet 2.5 mg (2.5 mg Oral Given 08/21/18 1820)    Mobility walks Low fall risk   Focused Assessments    R Recommendations: See Admitting Provider  Note  Report given to:   Additional Notes: none

## 2018-08-22 ENCOUNTER — Inpatient Hospital Stay
Admission: EM | Disposition: A | Discharge status: Other Acute Inpatient Hospital | Payer: Self-pay | Source: Home / Self Care | Attending: Internal Medicine

## 2018-08-22 ENCOUNTER — Encounter: Payer: Self-pay | Admitting: Vascular Surgery

## 2018-08-22 DIAGNOSIS — D508 Other iron deficiency anemias: Secondary | ICD-10-CM | POA: Insufficient documentation

## 2018-08-22 DIAGNOSIS — E875 Hyperkalemia: Secondary | ICD-10-CM

## 2018-08-22 DIAGNOSIS — N179 Acute kidney failure, unspecified: Secondary | ICD-10-CM

## 2018-08-22 DIAGNOSIS — D808 Other immunodeficiencies with predominantly antibody defects: Secondary | ICD-10-CM | POA: Insufficient documentation

## 2018-08-22 DIAGNOSIS — N0429 Other nephrotic syndrome with diffuse membranous glomerulonephritis: Secondary | ICD-10-CM | POA: Insufficient documentation

## 2018-08-22 DIAGNOSIS — F3160 Bipolar disorder, current episode mixed, unspecified: Secondary | ICD-10-CM | POA: Insufficient documentation

## 2018-08-22 DIAGNOSIS — D649 Anemia, unspecified: Secondary | ICD-10-CM | POA: Insufficient documentation

## 2018-08-22 DIAGNOSIS — I10 Essential (primary) hypertension: Secondary | ICD-10-CM

## 2018-08-22 DIAGNOSIS — N042 Nephrotic syndrome with diffuse membranous glomerulonephritis: Secondary | ICD-10-CM | POA: Insufficient documentation

## 2018-08-22 HISTORY — PX: DIALYSIS/PERMA CATHETER INSERTION: CATH118288

## 2018-08-22 LAB — URINE CULTURE: Culture: NO GROWTH

## 2018-08-22 LAB — MRSA PCR SCREENING: MRSA by PCR: NEGATIVE

## 2018-08-22 LAB — BASIC METABOLIC PANEL
Anion gap: 19 — ABNORMAL HIGH (ref 5–15)
BUN: 138 mg/dL — AB (ref 6–20)
CALCIUM: 7.9 mg/dL — AB (ref 8.9–10.3)
CO2: 11 mmol/L — ABNORMAL LOW (ref 22–32)
CREATININE: 14.11 mg/dL — AB (ref 0.44–1.00)
Chloride: 106 mmol/L (ref 98–111)
GFR calc Af Amer: 3 mL/min — ABNORMAL LOW (ref >60)
GFR calc non Af Amer: 3 mL/min — ABNORMAL LOW (ref >60)
Glucose, Bld: 94 mg/dL (ref 70–99)
Potassium: 7.1 mmol/L (ref 3.5–5.1)
Sodium: 136 mmol/L (ref 135–145)

## 2018-08-22 LAB — HIV ANTIBODY (ROUTINE TESTING W REFLEX): HIV SCREEN 4TH GENERATION: NONREACTIVE

## 2018-08-22 LAB — POTASSIUM: Potassium: 3.6 mmol/L (ref 3.5–5.1)

## 2018-08-22 SURGERY — DIALYSIS/PERMA CATHETER INSERTION
Anesthesia: LOCAL

## 2018-08-22 MED ORDER — INSULIN ASPART 100 UNIT/ML IV SOLN
10.0000 [IU] | Freq: Once | INTRAVENOUS | Status: AC
  Administered 2018-08-22: 10 [IU] via INTRAVENOUS
  Filled 2018-08-22: qty 0.1

## 2018-08-22 MED ORDER — SODIUM CHLORIDE 0.9 % IV SOLN: INTRAVENOUS | Status: DC

## 2018-08-22 MED ORDER — SODIUM POLYSTYRENE SULFONATE 15 GM/60ML PO SUSP
30.0000 g | Freq: Once | ORAL | Status: AC
  Administered 2018-08-22: 30 g via ORAL
  Filled 2018-08-22: qty 120

## 2018-08-22 MED ORDER — DEXTROSE 50 % IV SOLN
50.0000 mL | Freq: Once | INTRAVENOUS | Status: AC
  Administered 2018-08-22: 50 mL via INTRAVENOUS
  Filled 2018-08-22: qty 50

## 2018-08-22 MED ORDER — CALCIUM GLUCONATE-NACL 1-0.675 GM/50ML-% IV SOLN
1.0000 g | Freq: Once | INTRAVENOUS | Status: AC
  Administered 2018-08-22: 1000 mg via INTRAVENOUS
  Filled 2018-08-22: qty 50

## 2018-08-22 MED ORDER — SODIUM BICARBONATE 8.4 % IV SOLN
25.0000 meq | Freq: Once | INTRAVENOUS | Status: AC
  Administered 2018-08-22: 25 meq via INTRAVENOUS
  Filled 2018-08-22: qty 50

## 2018-08-22 MED ORDER — HYDRALAZINE HCL 20 MG/ML IJ SOLN
10.0000 mg | Freq: Four times a day (QID) | INTRAMUSCULAR | Status: DC | PRN
  Administered 2018-08-26 – 2018-08-28 (×2): 10 mg via INTRAVENOUS
  Filled 2018-08-22 (×2): qty 1

## 2018-08-22 MED ORDER — DEXTROSE 250 MG/ML IV SOLN
25.0000 g | Freq: Once | INTRAVENOUS | Status: DC
  Filled 2018-08-22: qty 100

## 2018-08-22 MED ORDER — CHLORHEXIDINE GLUCONATE CLOTH 2 % EX PADS
6.0000 | MEDICATED_PAD | Freq: Every day | CUTANEOUS | Status: DC
  Administered 2018-08-24 – 2018-09-03 (×10): 6 via TOPICAL

## 2018-08-22 SURGICAL SUPPLY — 1 items: KIT DIALYSIS CATH TRI 30X13 (CATHETERS) ×1 IMPLANT

## 2018-08-22 NOTE — Progress Notes (Signed)
Dr. Marcille Blanco notified of elevated DBP (172/99), parameters only for SBP; acknowledged; will modify parameters for PRN BP medication.

## 2018-08-22 NOTE — Consult Note (Signed)
[facility] Consult Note  MRN : [id number]  Jennifer Newman is a 53 y.o. (1965-07-29) female who presents with chief complaint of  Chief Complaint  Patient presents with  . Headache  . Epistaxis  .  History of Present Illness: Patient is admitted to the hospital with headache and a nosebleed and has found to have acute renal failure.  The patient reports lethargy and some swelling.  The patient is critically ill with a BUN of 138 and a potassium now up to 7.1 and she needs urgent dialysis. The nephrology service has decided to initiate dialysis at this time, and we are asked to place a temporary dialysis catheter for immediate dialysis use.  She is very emotional and scared at this point.  She is still awake and alert.  Her weight is up about 4 kg from admission.  No fever or chills.  Current Facility-Administered Medications  Medication Dose Route Frequency Provider Last Rate Last Dose  . 0.9 %  sodium chloride infusion   Intravenous Continuous Bettey Costa, MD   Stopped at 08/22/18 0557  . 0.9 %  sodium chloride infusion   Intravenous Continuous Jandy Brackens, Erskine Squibb, MD      . Doug Sou Hold] acetaminophen (TYLENOL) tablet 650 mg  650 mg Oral Q6H PRN Bettey Costa, MD       Or  . Doug Sou Hold] acetaminophen (TYLENOL) suppository 650 mg  650 mg Rectal Q6H PRN Bettey Costa, MD      . Doug Sou Hold] butalbital-acetaminophen-caffeine (FIORICET, ESGIC) 50-325-40 MG per tablet 1 tablet  1 tablet Oral Q6H PRN Nicholes Mango, MD   1 tablet at 08/22/18 0605  . [MAR Hold] Chlorhexidine Gluconate Cloth 2 % PADS 6 each  6 each Topical Q0600 Murlean Iba, MD      . Doug Sou Hold] heparin injection 5,000 Units  5,000 Units Subcutaneous Q8H Bettey Costa, MD   5,000 Units at 08/22/18 0559  . [MAR Hold] hydrALAZINE (APRESOLINE) injection 10 mg  10 mg Intravenous Q6H PRN Harrie Foreman, MD      . Doug Sou Hold] metoprolol tartrate (LOPRESSOR) tablet 100 mg  100 mg Oral BID Bettey Costa, MD   100 mg at  08/22/18 0855  . [MAR Hold] ondansetron (ZOFRAN) tablet 4 mg  4 mg Oral Q6H PRN Bettey Costa, MD       Or  . Doug Sou Hold] ondansetron (ZOFRAN) injection 4 mg  4 mg Intravenous Q6H PRN Bettey Costa, MD   4 mg at 08/21/18 1434  . [MAR Hold] polyethylene glycol (MIRALAX / GLYCOLAX) packet 17 g  17 g Oral Daily PRN Bettey Costa, MD        Past Medical History:  Diagnosis Date  . Anxiety   . Bipolar disorder ([place])   . Depression   . Hypertension   . Obsessive-compulsive disorder   . PTSD (post-traumatic stress disorder)   . Tachycardia, unspecified   . Vertigo     Past Surgical History:  Procedure Laterality Date  . none      Social History Social History   Tobacco Use  . Smoking status: Never Smoker  . Smokeless tobacco: Never Used  Substance Use Topics  . Alcohol use: Yes    Alcohol/week: 1.0 standard drinks    Types: 1 Shots of liquor per week    Comment: occasionally  . Drug use: Not Currently    Types: Marijuana    Comment: Last used several weeks back     Family History  Family History  Problem Relation Age of Onset  . COPD Neg Hx   . Diabetes Neg Hx   . Hypertension Neg Hx   . CAD Neg Hx   No bleeding disorders or clotting disorders  No Known Allergies   REVIEW OF SYSTEMS (Negative unless checked)  Constitutional: [] Weight loss  [] Fever  [] Chills Cardiac: [] Chest pain   [] Chest pressure   [] Palpitations   [] Shortness of breath when laying flat   [] Shortness of breath at rest   [] Shortness of breath with exertion. Vascular:  [] Pain in legs with walking   [] Pain in legs at rest   [] Pain in legs when laying flat   [] Claudication   [] Pain in feet when walking  [] Pain in feet at rest  [] Pain in feet when laying flat   [] History of DVT   [] Phlebitis   [] Swelling in legs   [] Varicose veins   [] Non-healing ulcers Pulmonary:   [] Uses home oxygen   [] Productive cough   [] Hemoptysis   [] Wheeze  [] COPD   [] Asthma Neurologic:  [] Dizziness  [] Blackouts   [] Seizures   [] History  of stroke   [] History of TIA  [] Aphasia   [] Temporary blindness   [] Dysphagia   [] Weakness or numbness in arms   [] Weakness or numbness in legs Musculoskeletal:  [x] Arthritis   [] Joint swelling   [] Joint pain   [] Low back pain Hematologic:  [] Easy bruising  [] Easy bleeding   [] Hypercoagulable state   [] Anemic  [] Hepatitis Gastrointestinal:  [] Blood in stool   [] Vomiting blood  [] Gastroesophageal reflux/heartburn   [] Difficulty swallowing. Genitourinary:  [x] Chronic kidney disease   [] Difficult urination  [] Frequent urination  [] Burning with urination   [] Blood in urine Skin:  [] Rashes   [] Ulcers   [] Wounds Psychological:  [x] History of anxiety   [x]  History of major depression.     Physical Examination  Vitals:   08/22/18 0417 08/22/18 0559 08/22/18 1025 08/22/18 1030  BP: (!) 172/99 (!) 160/95 (!) 179/115 (!) 170/95  Pulse: 70 69 68 66  Resp: (!) 24 20 (!) 24 (!) 22  Temp: 98.2 F (36.8 C)  98.4 F (36.9 C)   TempSrc: Oral  Oral   SpO2: 97%  98%   Weight:   108.3 kg   Height:       Body mass index is 42.29 kg/m. Gen: WD/WN Head: [place]/AT, No temporalis wasting Ear/Nose/Throat: Hearing grossly intact, nares w/o erythema or drainage Eyes: Sclera non-icteric, conjunctiva clear Neck: Supple, trachea midline.  No JVD.  Pulmonary:  Good air movement, respirations not labored, no use of accessory muscles Cardiac: RRR, no JVD Vascular: Feet warm with good capillary refill.  Palpable radial pulses Gastrointestinal: soft, non-tender/non-distended. No guarding/reflex.  Musculoskeletal: M/S 5/5 throughout.  Extremities without ischemic changes.  No deformity or atrophy.  Moderate edema in the lower extremities bilaterally Neurologic: Normal strength and tone in all 4 extremities.  Awake and alert Psychiatric: Somewhat anxious and emotional but good historian Dermatologic: No rashes or ulcers noted.         CBC Lab Results  Component Value Date   WBC 10.7 (H) 08/21/2018   HGB 11.8  (L) 08/21/2018   HCT 36.2 08/21/2018   MCV 89.2 08/21/2018   PLT 328 08/21/2018    BMET    Component Value Date/Time   NA 136 08/22/2018 0555   NA 135 (L) 10/09/2013 1548   K 7.1 ([place]) 08/22/2018 0555   K 3.6 10/09/2013 1548   CL 106 08/22/2018 0555   CL 106 10/09/2013  1548   CO2 11 (L) 08/22/2018 0555   CO2 23 10/09/2013 1548   GLUCOSE 94 08/22/2018 0555   GLUCOSE 84 10/09/2013 1548   BUN 138 (H) 08/22/2018 0555   BUN 16 10/09/2013 1548   CREATININE 14.11 (H) 08/22/2018 0555   CREATININE 0.60 10/09/2013 1548   CALCIUM 7.9 (L) 08/22/2018 0555   CALCIUM 8.9 10/09/2013 1548   GFRNONAA 3 (L) 08/22/2018 0555   GFRNONAA >60 10/09/2013 1548   GFRAA 3 (L) 08/22/2018 0555   GFRAA >60 10/09/2013 1548   Estimated Creatinine Clearance: 5.6 mL/min (A) (by C-G formula based on SCr of 14.11 mg/dL (H)).  COAG No results found for: INR, PROTIME  Radiology Ct Head Wo Contrast  Result Date: 08/21/2018 CLINICAL DATA:  53 year old female with bipolar disorder and headaches EXAM: CT HEAD WITHOUT CONTRAST TECHNIQUE: Contiguous axial images were obtained from the base of the skull through the vertex without intravenous contrast. COMPARISON:  12/13/2016 FINDINGS: Brain: No acute intracranial hemorrhage. No midline shift or mass effect. Gray-white differentiation maintained. Unremarkable appearance of the ventricular system. Vascular: Unremarkable. Skull: No acute fracture.  No aggressive bone lesion identified. Sinuses/Orbits: Unremarkable appearance of the orbits. Mastoid air cells clear. No middle ear effusion. No significant sinus disease. Other: None IMPRESSION: Negative head CT Electronically Signed   By: Corrie Mckusick D.O.   On: 08/21/2018 12:40   Dg Chest Portable 1 View  Result Date: 08/21/2018 CLINICAL DATA:  Headache. EXAM: PORTABLE CHEST 1 VIEW COMPARISON:  12/13/2016 FINDINGS: The heart is enlarged but appears stable. Slightly prominent mediastinal and hilar contours are unchanged. No  acute pulmonary findings. No infiltrates or effusions. The bony thorax is intact. IMPRESSION: Stable cardiac enlargement.  No acute pulmonary findings. Electronically Signed   By: Marijo Sanes M.D.   On: 08/21/2018 12:18   Ct Renal Stone Study  Result Date: 08/21/2018 CLINICAL DATA:  BROOKLIN RIEGER is a 53 y.o. female with a history of hypertension bipolar disorder and anxiety who complains of generalized weakness headaches and decreased urine output for the past 4 to 5 days. EXAM: CT ABDOMEN AND PELVIS WITHOUT CONTRAST TECHNIQUE: Multidetector CT imaging of the abdomen and pelvis was performed following the standard protocol without IV contrast. COMPARISON:  CT of the chest on 12/12/2016 FINDINGS: Lower chest: There is focal subsegmental atelectasis at the lung bases. The heart size is normal. Hepatobiliary: There is diffuse low-attenuation of the liver consistent with hepatic steatosis. Gallbladder is present and normal in CT appearance. Pancreas: Unremarkable. No pancreatic ductal dilatation or surrounding inflammatory changes. Spleen: No splenic injury or perisplenic hematoma. Adrenals/Urinary Tract: Adrenal glands are unremarkable. Kidneys are normal, without renal calculi, focal lesion, or hydronephrosis. Bladder is unremarkable. Stomach/Bowel: The stomach is unremarkable. Small bowel loops are normal in caliber. No bowel wall thickening or dilatation. The appendix is well seen and has a normal appearance. Loops of colon are normal in appearance. Vascular/Lymphatic: There is minimal atherosclerotic calcification of the abdominal aorta. No associated aneurysm. Reproductive: Uterus is enlarged. No adnexal mass. Other: No free pelvic fluid. Anterior abdominal wall is unremarkable. Musculoskeletal: No acute or significant osseous findings. IMPRESSION: 1. No acute abnormality of the abdomen or pelvis. 2. Hepatic steatosis. 3. Normal appendix. Aortic Atherosclerosis (ICD10-I70.0). Electronically Signed   By:  Nolon Nations M.D.   On: 08/21/2018 12:45      Assessment/Plan 1. ARF.  Essentially an uric at this point.  BUN is rising.  Needs dialysis 2.  Hyperkalemia.  Needs urgent dialysis.  Temp  cath will be placed 3.  Morbid obesity.  Ultrasound will be used for vascular access.  We will plan to use a groin access in case she needs a jugular access for her PermCath if her renal function does not improve 4.  Hypertension.  Likely an underlying cause of renal issues and blood pressure control important in reducing the progression of atherosclerotic disease. On appropriate oral medications.    We will proceed with temporary dialysis catheter placement at this time.  Risks and benefits discussed with patient and/or family, and the catheter will be placed to allow immediate initiation of dialysis.  If the patient's renal function does not improve throughout the hospital course, we will be happy to place a tunneled dialysis catheter for long term use prior to discharge.     Leotis Pain, MD  08/22/2018 10:46 AM

## 2018-08-22 NOTE — Progress Notes (Signed)
Tolerated first treatment well without issue. 0UF as ordered. Ran on 1K bath. Repeat K sent to lab as ordered. Report given to primary RN. Patient does tell me she has a headache. Reported off to primary RN, will medicate upon arrival to floor. Left unit in stable condition

## 2018-08-22 NOTE — Progress Notes (Signed)
Patient ID: Jennifer Newman, female   DOB: Sep 26, 1965, 53 y.o.   MRN: [id number]  [facility] PROGRESS NOTE  Jennifer Newman IDP:[id number] DOB: September 08, 1965 DOA: 08/21/2018 PCP: Barbaraann Boys, MD  HPI/Subjective: Patient seen early this morning.  Patient stated she has not urinated in 3 days.  Not feeling well.  Some nausea.  Patient said she had some diarrhea a few days ago but that had subsided.  Objective: Vitals:   08/22/18 1240 08/22/18 1324  BP: (!) 156/81 (!) 163/83  Pulse: 68 68  Resp: 13 (!) 21  Temp:  97.8 F (36.6 C)  SpO2:  100%    Filed Weights   08/22/18 0335 08/22/18 1025 08/22/18 1230  Weight: 108.3 kg 108.3 kg 108.3 kg    ROS: Review of Systems  Constitutional: Negative for chills and fever.  Eyes: Negative for blurred vision.  Respiratory: Positive for shortness of breath. Negative for cough.   Cardiovascular: Negative for chest pain.  Gastrointestinal: Positive for nausea. Negative for abdominal pain, constipation, diarrhea and vomiting.  Genitourinary: Negative for dysuria.  Musculoskeletal: Negative for joint pain.  Neurological: Negative for dizziness and headaches.   Exam: Physical Exam  HENT:  Nose: No mucosal edema.  Mouth/Throat: No oropharyngeal exudate or posterior oropharyngeal edema.  Eyes: Pupils are equal, round, and reactive to light. Conjunctivae, EOM and lids are normal.  Neck: No JVD present. Carotid bruit is not present. No edema present. No thyroid mass and no thyromegaly present.  Cardiovascular: S1 normal and S2 normal. Exam reveals no gallop.  No murmur heard. Pulses:      Dorsalis pedis pulses are 2+ on the right side and 2+ on the left side.  Respiratory: No respiratory distress. She has no wheezes. She has no rhonchi. She has no rales.  GI: Soft. Bowel sounds are normal. There is no abdominal tenderness.  Musculoskeletal:     Right ankle: She exhibits swelling.     Left ankle: She exhibits swelling.  Lymphadenopathy:     She has no cervical adenopathy.  Neurological: She is alert. No cranial nerve deficit.  Skin: Skin is warm. No rash noted. Nails show no clubbing.  Psychiatric: She has a normal mood and affect.      Data Reviewed: Basic Metabolic Panel: Recent Labs  Lab 08/21/18 0934 08/21/18 1926 08/22/18 0555 08/22/18 1239  NA 135 136 136  --   K 6.5* 6.2* 7.1* 3.6  CL 105 106 106  --   CO2 12* 12* 11*  --   GLUCOSE 96 97 94  --   BUN 133* 137* 138*  --   CREATININE 13.29* 13.51* 14.11*  --   CALCIUM 8.8* 8.1* 7.9*  --   PHOS  --  11.4*  --   --    Liver Function Tests: Recent Labs  Lab 08/21/18 0934  AST 20  ALT 44  ALKPHOS 198*  BILITOT 0.8  PROT 8.3*  ALBUMIN 3.4*   CBC: Recent Labs  Lab 08/21/18 0934  WBC 10.7*  NEUTROABS 7.9*  HGB 11.8*  HCT 36.2  MCV 89.2  PLT 328   Cardiac Enzymes: Recent Labs  Lab 08/21/18 1926  CKTOTAL 46     Recent Results (from the past 240 hour(s))  Urine Culture     Status: None   Collection Time: 08/21/18  9:34 AM  Result Value Ref Range Status   Specimen Description   Final    URINE, RANDOM Performed at [facility], [address] [place],  [place], [place] [address]    Special Requests   Final    NONE Performed at [facility], [address] [place], [place] [address]    Culture   Final    NO GROWTH Performed at [facility], [place] [address] [place], [place] [address]    Report Status 08/22/2018 FINAL  Final  MRSA PCR Screening     Status: None   Collection Time: 08/22/18  1:48 AM  Result Value Ref Range Status   MRSA by PCR NEGATIVE NEGATIVE Final    Comment:        The GeneXpert MRSA Assay (FDA approved for NASAL specimens only), is one component of a comprehensive MRSA colonization surveillance program. It is not intended to diagnose MRSA infection nor to guide or monitor treatment for MRSA infections. Performed at [facility], [address] [place], [place] [address]       Studies: Ct Head Wo Contrast  Result Date: 08/21/2018 CLINICAL DATA:  53 year old female with bipolar disorder and headaches EXAM: CT HEAD WITHOUT CONTRAST TECHNIQUE: Contiguous axial images were obtained from the base of the skull through the vertex without intravenous contrast. COMPARISON:  12/13/2016 FINDINGS: Brain: No acute intracranial hemorrhage. No midline shift or mass effect. Gray-white differentiation maintained. Unremarkable appearance of the ventricular system. Vascular: Unremarkable. Skull: No acute fracture.  No aggressive bone lesion identified. Sinuses/Orbits: Unremarkable appearance of the orbits. Mastoid air cells clear. No middle ear effusion. No significant sinus disease. Other: None IMPRESSION: Negative head CT Electronically Signed   By: Corrie Mckusick D.O.   On: 08/21/2018 12:40   Dg Chest Portable 1 View  Result Date: 08/21/2018 CLINICAL DATA:  Headache. EXAM: PORTABLE CHEST 1 VIEW COMPARISON:  12/13/2016 FINDINGS: The heart is enlarged but appears stable. Slightly prominent mediastinal and hilar contours are unchanged. No acute pulmonary findings. No infiltrates or effusions. The bony thorax is intact. IMPRESSION: Stable cardiac enlargement.  No acute pulmonary findings. Electronically Signed   By: Marijo Sanes M.D.   On: 08/21/2018 12:18   Ct Renal Stone Study  Result Date: 08/21/2018 CLINICAL DATA:  Jennifer Newman is a 53 y.o. female with a history of hypertension bipolar disorder and anxiety who complains of generalized weakness headaches and decreased urine output for the past 4 to 5 days. EXAM: CT ABDOMEN AND PELVIS WITHOUT CONTRAST TECHNIQUE: Multidetector CT imaging of the abdomen and pelvis was performed following the standard protocol without IV contrast. COMPARISON:  CT of the chest on 12/12/2016 FINDINGS: Lower chest: There is focal subsegmental atelectasis at the lung bases. The heart size is normal. Hepatobiliary: There is diffuse low-attenuation of the liver  consistent with hepatic steatosis. Gallbladder is present and normal in CT appearance. Pancreas: Unremarkable. No pancreatic ductal dilatation or surrounding inflammatory changes. Spleen: No splenic injury or perisplenic hematoma. Adrenals/Urinary Tract: Adrenal glands are unremarkable. Kidneys are normal, without renal calculi, focal lesion, or hydronephrosis. Bladder is unremarkable. Stomach/Bowel: The stomach is unremarkable. Small bowel loops are normal in caliber. No bowel wall thickening or dilatation. The appendix is well seen and has a normal appearance. Loops of colon are normal in appearance. Vascular/Lymphatic: There is minimal atherosclerotic calcification of the abdominal aorta. No associated aneurysm. Reproductive: Uterus is enlarged. No adnexal mass. Other: No free pelvic fluid. Anterior abdominal wall is unremarkable. Musculoskeletal: No acute or significant osseous findings. IMPRESSION: 1. No acute abnormality of the abdomen or pelvis. 2. Hepatic steatosis. 3. Normal appendix. Aortic Atherosclerosis (ICD10-I70.0). Electronically Signed   By:  Nolon Nations M.D.   On: 08/21/2018 12:45    Scheduled Meds: . Chlorhexidine Gluconate Cloth  6 each Topical Q0600  . heparin  5,000 Units Subcutaneous Q8H  . metoprolol tartrate  100 mg Oral BID   Continuous Infusions: . sodium chloride Stopped (08/22/18 0557)    Assessment/Plan:  1. Acute oliguric kidney failure with hyperkalemia.  Case discussed with Dr. Candiss Norse nephrology to start dialysis today.  I did order IV medications for hyperkalemia including calcium, bicarb, D50 and insulin and Kayexalate.  Repeat potassium after dialysis today 3.6.  Recheck BMP tomorrow. 2. Essential hypertension on metoprolol 3. Morbid obesity with a BMI of 42.29.  Weight loss needed   Code Status:     Code Status Orders  (From admission, onward)         Start     Ordered   08/21/18 1139  Full code  Continuous     08/21/18 1139        Code Status  History    Date Active Date Inactive Code Status Order ID Comments User Context   12/12/2016 0555 12/14/2016 1630 Full Code [id number]  Saundra Shelling, MD ED     Family Communication: Left message for husband on the phone Disposition Plan: To be determined  Consultants:  Nephrology  Vascular surgery  Procedures:  Dialysis catheter  Time spent: 28 minutes  [place]

## 2018-08-22 NOTE — Progress Notes (Signed)
Dr. Candiss Norse notified of critical K+ and subsequent orders; verified that it was OK to give Kayexelate with NPO status; acknowledged; ok to give Zigmund Daniel; Report given to Jordan Hawks, RN; Barbaraann Faster, RN 7:42 AM; 08/22/2018

## 2018-08-22 NOTE — Progress Notes (Signed)
Ch visited w/ pt to discuss AD. Pt was not clear as to what an AD was. Ch educated pt on the document. Ch assessed that pt was not mentally capable of signing the document and the pt shared that she was in pain at the time of the visit. Ch did leave a copy w/ the pt and informed her that it was best to complete it when she has gained her health. Pt family was present also.    08/22/18 1400  Clinical Encounter Type  Visited With Patient and family together  Visit Type Psychological support;Spiritual support;Social support  Referral From Physician  Consult/Referral To Chaplain  Spiritual Encounters  Spiritual Needs Emotional;Grief support  Stress Factors  Patient Stress Factors Major life changes  Family Stress Factors None identified  Advance Directives (For Healthcare)  Does Patient Have a Medical Advance Directive? No  Would patient like information on creating a medical advance directive? Yes (Inpatient - patient defers creating a medical advance directive at this time)  [place]  Does Patient Have a Mental Health Advance Directive? No

## 2018-08-22 NOTE — Progress Notes (Signed)
Pre Hd Tx   08/22/18 1025  Vital Signs  Temp 98.4 F (36.9 C)  Temp Source Oral  Pulse Rate 68  Pulse Rate Source Monitor  Resp (!) 24  BP (!) 179/115  BP Location Left Arm  BP Method Automatic  Patient Position (if appropriate) Lying  Oxygen Therapy  SpO2 98 %  O2 Device Room Air  Pain Assessment  Pain Scale FLACC  Pain Assessment/FLACC  Pain Rating: FLACC  - Face 1  Pain Rating: FLACC - Legs 1  Pain Rating: FLACC - Activity 0  Pain Rating: FLACC - Cry 1  Pain Rating: FLACC - Consolability 1  Score: FLACC  4  Pain Intervention(s) Therapeutic touch;Emotional support  Dialysis Weight  Weight 108.3 kg  Type of Weight Pre-Dialysis  Time-Out for Hemodialysis  What Procedure? Hd  Pt Identifiers(min of two) First/Last Name;MRN/Account#  Correct Site? Yes  Correct Side? Yes  Correct Procedure? Yes  Consents Verified? Yes  Rad Studies Available? No  Safety Precautions Reviewed? Yes  Engineer, civil (consulting) Number 3  Station Number 1  UF/Alarm Test Passed  Conductivity: Meter 13.8  Conductivity: Machine  13.9  pH 7.2  Reverse Osmosis main  Normal Saline Lot Number [id number]  Dialyzer Lot Number [id number]  Disposable Set Lot Number [id number]  Machine Temperature 98.6 F (37 C)  Musician and Audible Yes  Blood Lines Intact and Secured Yes  Pre Treatment Patient Checks  Vascular access used during treatment Catheter  Hepatitis B Surface Antigen Results  (unk - drawn 08/21/18)  Hepatitis B Surface Antibody  (unk - drawn 08/21/18')  Hemodialysis Consent Verified Yes  ECG (Telemetry) Monitor On Yes  Prime Ordered Normal Saline  Length of  DialysisTreatment -hour(s) 2 Hour(s)  Dialyzer Elisio 17H NR  Dialysate 1K  Dialysate Flow Ordered 300  Blood Flow Rate Ordered 250 mL/min  Ultrafiltration Goal 0 Liters  Pre Treatment Labs Renal panel;CBC (post HD potassium )  Dialysis Blood Pressure Support Ordered Normal Saline  Education / Care Plan  Dialysis Education  Provided Yes  Documented Education in Care Plan Yes

## 2018-08-22 NOTE — Op Note (Signed)
  OPERATIVE NOTE   PROCEDURE: 1. Ultrasound guidance for vascular access right femoral vein 2. Placement of a 30 cm triple lumen dialysis catheter right femoral vein  PRE-OPERATIVE DIAGNOSIS: 1. Acute renal failure 2. hyperkalemia  POST-OPERATIVE DIAGNOSIS: Same  SURGEON: Leotis Pain, MD  ASSISTANT(S): None  ANESTHESIA: local  ESTIMATED BLOOD LOSS: Minimal   FINDING(S): 1. None  SPECIMEN(S): None  INDICATIONS:  Patient is a 53 y.o.female who presents with hyperkalemia and renal failure.  Risks and benefits were discussed, and informed consent was obtained..  DESCRIPTION: After obtaining full informed written consent, the patient was laid flat in the bed. The right groin was sterilely prepped and draped in a sterile surgical field was created. The right femoral vein was visualized with ultrasound and found to be widely patent. It was then accessed under direct guidance without difficulty with a Seldinger needle and a permanent image was recorded. A J-wire was then placed. After skin nick and dilatation, a 30 cm triple lumen dialysis catheter was placed over the wire and the wire was removed. The lumens withdrew dark red nonpulsatile blood and flushed easily with sterile saline. The catheter was secured to the skin with 3 nylon sutures. Sterile dressing was placed.  COMPLICATIONS: None  CONDITION: Stable  Leotis Pain 08/22/2018 9:52 AM  This note was created with Dragon Medical transcription system. Any errors in dictation are purely unintentional.

## 2018-08-22 NOTE — Progress Notes (Signed)
[facility], [place] 08/22/18  Subjective:   Patient is not feeling any better this morning.  No shortness of breath.  Urine output only about 50 cc last time.  Potassium is critically elevated this morning at 7.1 and BUN is 138.  Patient is very emotional this morning due to her illness.  Objective:  Vital signs in last 24 hours:  Temp:  [97.5 F (36.4 C)-98.3 F (36.8 C)] 98.2 F (36.8 C) (02/20 0417) Pulse Rate:  [59-73] 69 (02/20 0559) Resp:  [15-27] 20 (02/20 0559) BP: (143-199)/(72-99) 160/95 (02/20 0559) SpO2:  [97 %-100 %] 97 % (02/20 0417) Weight:  [104.3 kg-108.3 kg] 108.3 kg (02/20 0335)  Weight change:  Filed Weights   08/21/18 0928 08/22/18 0335  Weight: 104.3 kg 108.3 kg    Intake/Output:    Intake/Output Summary (Last 24 hours) at 08/22/2018 0902 Last data filed at 08/22/2018 7793 Gross per 24 hour  Intake 1897.96 ml  Output 10 ml  Net 1887.96 ml     Physical Exam: General:  No acute distress,  HEENT  anicteric, dry oral mucous membranes  Neck  supple  Pulm/lungs  normal breathing effort, clear to auscultation  CVS/Heart  no rub  Abdomen:   Soft, nontender  Extremities:  Trace edema  Neurologic:  Alert, oriented  Skin:  No acute rashes  Access:  To be placed       Basic Metabolic Panel:  Recent Labs  Lab 08/21/18 0934 08/21/18 1926 08/22/18 0555  NA 135 136 136  K 6.5* 6.2* 7.1*  CL 105 106 106  CO2 12* 12* 11*  GLUCOSE 96 97 94  BUN 133* 137* 138*  CREATININE 13.29* 13.51* 14.11*  CALCIUM 8.8* 8.1* 7.9*  PHOS  --  11.4*  --      CBC: Recent Labs  Lab 08/21/18 0934  WBC 10.7*  NEUTROABS 7.9*  HGB 11.8*  HCT 36.2  MCV 89.2  PLT 328     No results found for: HEPBSAG, HEPBSAB, HEPBIGM    Microbiology:  Recent Results (from the past 240 hour(s))  MRSA PCR Screening     Status: None   Collection Time: 08/22/18  1:48 AM  Result Value Ref Range Status   MRSA by PCR NEGATIVE NEGATIVE Final     Comment:        The GeneXpert MRSA Assay (FDA approved for NASAL specimens only), is one component of a comprehensive MRSA colonization surveillance program. It is not intended to diagnose MRSA infection nor to guide or monitor treatment for MRSA infections. Performed at [facility], [address] [place], [place] [address]     Coagulation Studies: No results for input(s): LABPROT, INR in the last 72 hours.  Urinalysis: Recent Labs    08/21/18 0934  COLORURINE YELLOW*  LABSPEC 1.015  PHURINE 5.0  GLUCOSEU 50*  HGBUR LARGE*  BILIRUBINUR NEGATIVE  KETONESUR NEGATIVE  PROTEINUR 100*  NITRITE NEGATIVE  LEUKOCYTESUR NEGATIVE      Imaging: Ct Head Wo Contrast  Result Date: 08/21/2018 CLINICAL DATA:  53 year old female with bipolar disorder and headaches EXAM: CT HEAD WITHOUT CONTRAST TECHNIQUE: Contiguous axial images were obtained from the base of the skull through the vertex without intravenous contrast. COMPARISON:  12/13/2016 FINDINGS: Brain: No acute intracranial hemorrhage. No midline shift or mass effect. Gray-white differentiation maintained. Unremarkable appearance of the ventricular system. Vascular: Unremarkable. Skull: No acute fracture.  No aggressive bone lesion identified. Sinuses/Orbits: Unremarkable appearance of the orbits. Mastoid air cells  clear. No middle ear effusion. No significant sinus disease. Other: None IMPRESSION: Negative head CT Electronically Signed   By: Corrie Mckusick D.O.   On: 08/21/2018 12:40   Dg Chest Portable 1 View  Result Date: 08/21/2018 CLINICAL DATA:  Headache. EXAM: PORTABLE CHEST 1 VIEW COMPARISON:  12/13/2016 FINDINGS: The heart is enlarged but appears stable. Slightly prominent mediastinal and hilar contours are unchanged. No acute pulmonary findings. No infiltrates or effusions. The bony thorax is intact. IMPRESSION: Stable cardiac enlargement.  No acute pulmonary findings. Electronically Signed   By: Marijo Sanes M.D.    On: 08/21/2018 12:18   Ct Renal Stone Study  Result Date: 08/21/2018 CLINICAL DATA:  HOLLEE FATE is a 54 y.o. female with a history of hypertension bipolar disorder and anxiety who complains of generalized weakness headaches and decreased urine output for the past 4 to 5 days. EXAM: CT ABDOMEN AND PELVIS WITHOUT CONTRAST TECHNIQUE: Multidetector CT imaging of the abdomen and pelvis was performed following the standard protocol without IV contrast. COMPARISON:  CT of the chest on 12/12/2016 FINDINGS: Lower chest: There is focal subsegmental atelectasis at the lung bases. The heart size is normal. Hepatobiliary: There is diffuse low-attenuation of the liver consistent with hepatic steatosis. Gallbladder is present and normal in CT appearance. Pancreas: Unremarkable. No pancreatic ductal dilatation or surrounding inflammatory changes. Spleen: No splenic injury or perisplenic hematoma. Adrenals/Urinary Tract: Adrenal glands are unremarkable. Kidneys are normal, without renal calculi, focal lesion, or hydronephrosis. Bladder is unremarkable. Stomach/Bowel: The stomach is unremarkable. Small bowel loops are normal in caliber. No bowel wall thickening or dilatation. The appendix is well seen and has a normal appearance. Loops of colon are normal in appearance. Vascular/Lymphatic: There is minimal atherosclerotic calcification of the abdominal aorta. No associated aneurysm. Reproductive: Uterus is enlarged. No adnexal mass. Other: No free pelvic fluid. Anterior abdominal wall is unremarkable. Musculoskeletal: No acute or significant osseous findings. IMPRESSION: 1. No acute abnormality of the abdomen or pelvis. 2. Hepatic steatosis. 3. Normal appendix. Aortic Atherosclerosis (ICD10-I70.0). Electronically Signed   By: Nolon Nations M.D.   On: 08/21/2018 12:45     Medications:   . sodium chloride Stopped (08/22/18 0557)   . heparin  5,000 Units Subcutaneous Q8H  . metoprolol tartrate  100 mg Oral BID    acetaminophen **OR** acetaminophen, butalbital-acetaminophen-caffeine, hydrALAZINE, ondansetron **OR** ondansetron (ZOFRAN) IV, polyethylene glycol  Assessment/ Plan:  53 y.o. Caucasian female with medical problems of bipolar disorder pretension, depression, anxiety, was admitted on 08/21/2018 with 2 weeks of headache, acute renal failure, severe hyperkalemia.   Acute kidney injury with severe hyperkalemia, oliguric -Cause of acute kidney injury is unclear.  Patient's baseline creatinine is 0.55 on December 14, 2016.  Renal imaging in the form of CT without IV contrast is negative for obstruction or stone.  It is likely patient developed severe ATN from volume depletion over the past few days due to decreased oral intake and nausea vomiting and diarrhea.  In addition, she was taking nonsteroidals regularly in the form of BC powders twice a day for chronic headaches.  Due to severe hyperkalemia nonresponsive to medical treatment, discussed urgent hemodialysis with patient.  Risks, benefits, alternatives discussed with patient.  She is quite emotional because of her acute illness but has agreed to proceed.  I have contacted vascular surgery for temporary dialysis catheter placement.  Soon after, patient will be dialyzed today Next HD anticipated tomorrow  Proteinuria, hematuria, pyuria noted on urinalysis Likely UTI Will obtain urine  protein to creatinine ratio Await screening serologies     LOS: [place] 2/20/20209:02 [place], [place]  Note: This note was prepared with Dragon dictation. Any transcription errors are unintentional

## 2018-08-22 NOTE — Progress Notes (Signed)
Dr. Leslye Peer notified of critical K+ of 7.1 and nephrology consult; Acknowledged;  "Will need dialysis"; new orders written. Barbaraann Faster, RN 7:11 AM 08/22/2018

## 2018-08-22 NOTE — Consult Note (Signed)
Full consult note to follow. Hyperkalemia, renal failure Temp cath placed right groin without difficulty

## 2018-08-23 LAB — PROTEIN / CREATININE RATIO, URINE
CREATININE, URINE: 108 mg/dL
PROTEIN CREATININE RATIO: 6.4 mg/mg{creat} — AB (ref 0.00–0.15)
Total Protein, Urine: 691 mg/dL

## 2018-08-23 LAB — GASTROINTESTINAL PANEL BY PCR, STOOL (REPLACES STOOL CULTURE)

## 2018-08-23 LAB — BASIC METABOLIC PANEL
Anion gap: 15 (ref 5–15)
BUN: 92 mg/dL — ABNORMAL HIGH (ref 6–20)
CHLORIDE: 106 mmol/L (ref 98–111)
CO2: 18 mmol/L — ABNORMAL LOW (ref 22–32)
Calcium: 7.7 mg/dL — ABNORMAL LOW (ref 8.9–10.3)
Creatinine, Ser: 11.13 mg/dL — ABNORMAL HIGH (ref 0.44–1.00)
GFR calc Af Amer: 4 mL/min — ABNORMAL LOW (ref >60)
GFR calc non Af Amer: 4 mL/min — ABNORMAL LOW (ref >60)
Glucose, Bld: 110 mg/dL — ABNORMAL HIGH (ref 70–99)
POTASSIUM: 4.3 mmol/L (ref 3.5–5.1)
Sodium: 139 mmol/L (ref 135–145)

## 2018-08-23 LAB — HCV COMMENT:

## 2018-08-23 LAB — PROTEIN ELECTROPHORESIS, SERUM
A/G Ratio: 0.7 (ref 0.7–1.7)
ALPHA-1-GLOBULIN: 0.2 g/dL (ref 0.0–0.4)
ALPHA-2-GLOBULIN: 1.1 g/dL — AB (ref 0.4–1.0)
Albumin ELP: 2.7 g/dL — ABNORMAL LOW (ref 2.9–4.4)
Beta Globulin: 1.5 g/dL — ABNORMAL HIGH (ref 0.7–1.3)
Gamma Globulin: 1.1 g/dL (ref 0.4–1.8)
Globulin, Total: 3.9 g/dL (ref 2.2–3.9)
M-Spike, %: 0.4 g/dL — ABNORMAL HIGH
Total Protein ELP: 6.6 g/dL (ref 6.0–8.5)

## 2018-08-23 LAB — GLOMERULAR BASEMENT MEMBRANE ANTIBODIES: GBM Ab: 4 units (ref 0–20)

## 2018-08-23 LAB — HEPATITIS B CORE ANTIBODY, TOTAL: Hep B Core Total Ab: NEGATIVE

## 2018-08-23 LAB — C3 COMPLEMENT: C3 Complement: 201 mg/dL — ABNORMAL HIGH (ref 82–167)

## 2018-08-23 LAB — KAPPA/LAMBDA LIGHT CHAINS
KAPPA, LAMDA LIGHT CHAIN RATIO: 2.04 — AB (ref 0.26–1.65)
Kappa free light chain: 277.9 mg/L — ABNORMAL HIGH (ref 3.3–19.4)
Lambda free light chains: 136 mg/L — ABNORMAL HIGH (ref 5.7–26.3)

## 2018-08-23 LAB — HEPATITIS B SURFACE ANTIBODY,QUALITATIVE: Hep B S Ab: NONREACTIVE

## 2018-08-23 LAB — HEPATITIS C ANTIBODY (REFLEX): HCV Ab: <0.1 s/co ratio (ref 0.0–0.9)

## 2018-08-23 LAB — HEPATITIS B SURFACE ANTIGEN: Hepatitis B Surface Ag: NEGATIVE

## 2018-08-23 LAB — C4 COMPLEMENT: Complement C4, Body Fluid: 43 mg/dL (ref 14–44)

## 2018-08-23 LAB — ANA W/REFLEX IF POSITIVE: Anti Nuclear Antibody(ANA): NEGATIVE

## 2018-08-23 NOTE — Progress Notes (Signed)
Found patient drinking from large pitcher of water; instructed patient on fluid restriction of 1200 ml in 24 hours for AKF patients and patients on dialysis; will continue to monitor po intake; Barbaraann Faster, RN 2:43 AM 08/23/2018

## 2018-08-23 NOTE — Progress Notes (Signed)
HD tx end    08/23/18 1230  Vital Signs  Pulse Rate (!) 53  Pulse Rate Source Monitor  Resp 19  BP (!) 181/106  BP Location Left Arm  BP Method Automatic  Patient Position (if appropriate) Lying  Oxygen Therapy  SpO2 100 %  O2 Device Room Air  During Hemodialysis Assessment  Dialysis Fluid Bolus Normal Saline  Bolus Amount (mL) 250 mL  Intra-Hemodialysis Comments Tx completed

## 2018-08-23 NOTE — Progress Notes (Signed)
Post HD assessment. Pt tolerated tx well without c/o or complication. Net UF 0, goal met.    08/23/18 1232  Vital Signs  Temp 97.6 F (36.4 C)  Temp Source Oral  Pulse Rate (!) 56  Pulse Rate Source Monitor  Resp (!) 21  BP (!) 192/95  BP Location Left Arm  BP Method Automatic  Patient Position (if appropriate) Lying  Oxygen Therapy  SpO2 99 %  O2 Device Room Air  Dialysis Weight  Weight 114.5 kg  Type of Weight Post-Dialysis  Post-Hemodialysis Assessment  Rinseback Volume (mL) 250 mL  KECN 42.2 V  Dialyzer Clearance Lightly streaked  Duration of HD Treatment -hour(s) 2.5 hour(s)  Hemodialysis Intake (mL) 500 mL  UF Total -Machine (mL) 500 mL  Net UF (mL) 0 mL  Tolerated HD Treatment Yes  Education / Care Plan  Dialysis Education Provided Yes  Documented Education in Care Plan Yes  Hemodialysis Catheter Right Femoral vein Triple-lumen  Placement Date: 08/22/18   Placed prior to admission: No  Person Inserting Catheter: Dr. Lucky Cowboy  Orientation: Right  Access Location: Femoral vein  Hemodialysis Catheter Type: Triple-lumen  Site Condition No complications  Blue Lumen Status Heparin locked  Red Lumen Status Heparin locked  Purple Lumen Status N/A  Catheter fill solution Heparin 1000 units/ml  Catheter fill volume (Arterial) 1.8 cc  Catheter fill volume (Venous) 1.8  Dressing Type Gauze/Drain sponge  Dressing Status Clean;Dry;Intact  Drainage Description None  Post treatment catheter status Capped and Clamped

## 2018-08-23 NOTE — Progress Notes (Signed)
Patient ID: Jennifer Newman, female   DOB: [birth year], 53 y.o.   MRN: [id number]  [facility] PROGRESS NOTE  Jennifer Newman SMO:[id number] DOB: 1966-05-02 DOA: 08/21/2018 PCP: Barbaraann Boys, MD  HPI/Subjective: Patient feeling a little bit better.  Offers no complaints.  Has a little bit of diarrhea.  Tolerated dialysis yesterday.  Objective: Vitals:   08/23/18 1237 08/23/18 1256  BP:  (!) 157/73  Pulse: (!) 59 (!) 59  Resp: 19 20  Temp:  98.4 F (36.9 C)  SpO2: 97% 97%    Filed Weights   08/23/18 0459 08/23/18 0952 08/23/18 1232  Weight: 112 kg 114.3 kg 114.5 kg    ROS: Review of Systems  Constitutional: Negative for chills and fever.  Eyes: Negative for blurred vision.  Respiratory: Negative for cough and shortness of breath.   Cardiovascular: Negative for chest pain.  Gastrointestinal: Positive for diarrhea. Negative for abdominal pain, constipation, nausea and vomiting.  Genitourinary: Negative for dysuria.  Musculoskeletal: Negative for joint pain.  Neurological: Negative for dizziness and headaches.   Exam: Physical Exam  HENT:  Nose: No mucosal edema.  Mouth/Throat: No oropharyngeal exudate or posterior oropharyngeal edema.  Eyes: Pupils are equal, round, and reactive to light. Conjunctivae, EOM and lids are normal.  Neck: No JVD present. Carotid bruit is not present. No edema present. No thyroid mass and no thyromegaly present.  Cardiovascular: S1 normal and S2 normal. Exam reveals no gallop.  No murmur heard. Pulses:      Dorsalis pedis pulses are 2+ on the right side and 2+ on the left side.  Respiratory: No respiratory distress. She has no wheezes. She has no rhonchi. She has no rales.  GI: Soft. Bowel sounds are normal. There is no abdominal tenderness.  Musculoskeletal:     Right ankle: She exhibits swelling.     Left ankle: She exhibits swelling.  Lymphadenopathy:    She has no cervical adenopathy.  Neurological: She is alert. No cranial nerve  deficit.  Skin: Skin is warm. No rash noted. Nails show no clubbing.  Psychiatric: She has a normal mood and affect.      Data Reviewed: Basic Metabolic Panel: Recent Labs  Lab 08/21/18 0934 08/21/18 1926 08/22/18 0555 08/22/18 1239 08/23/18 0641  NA 135 136 136  --  139  K 6.5* 6.2* 7.1* 3.6 4.3  CL 105 106 106  --  106  CO2 12* 12* 11*  --  18*  GLUCOSE 96 97 94  --  110*  BUN 133* 137* 138*  --  92*  CREATININE 13.29* 13.51* 14.11*  --  11.13*  CALCIUM 8.8* 8.1* 7.9*  --  7.7*  PHOS  --  11.4*  --   --   --    Liver Function Tests: Recent Labs  Lab 08/21/18 0934  AST 20  ALT 44  ALKPHOS 198*  BILITOT 0.8  PROT 8.3*  ALBUMIN 3.4*   CBC: Recent Labs  Lab 08/21/18 0934  WBC 10.7*  NEUTROABS 7.9*  HGB 11.8*  HCT 36.2  MCV 89.2  PLT 328   Cardiac Enzymes: Recent Labs  Lab 08/21/18 1926  CKTOTAL 46     Recent Results (from the past 240 hour(s))  Urine Culture     Status: None   Collection Time: 08/21/18  9:34 AM  Result Value Ref Range Status   Specimen Description   Final    URINE, RANDOM Performed at [facility], [address] [place], [place] [address]  Special Requests   Final    NONE Performed at [facility], [address], [place], [place] [address]    Culture   Final    NO GROWTH Performed at [facility], [address], [place], [place] [address]    Report Status 08/22/2018 FINAL  Final  MRSA PCR Screening     Status: None   Collection Time: 08/22/18  1:48 AM  Result Value Ref Range Status   MRSA by PCR NEGATIVE NEGATIVE Final    Comment:        The GeneXpert MRSA Assay (FDA approved for NASAL specimens only), is one component of a comprehensive MRSA colonization surveillance program. It is not intended to diagnose MRSA infection nor to guide or monitor treatment for MRSA infections. Performed at [facility], [address] [place], [place] [address]      Scheduled Meds: .  Chlorhexidine Gluconate Cloth  6 each Topical Q0600  . heparin  5,000 Units Subcutaneous Q8H  . metoprolol tartrate  100 mg Oral BID   Continuous Infusions:   Assessment/Plan:  1. Acute oliguric kidney failure with hyperkalemia.  Case discussed with Dr. Candiss Norse nephrology to do dialysis again today for second dialysis session.  Potassium has improved with the first dialysis session and creatinine has improved with first dialysis. 2. Essential hypertension on metoprolol 3. Morbid obesity with a BMI of 44.72.  Weight loss needed 4. Diarrhea.  Could be the cause of her acute kidney injury.  Send off stool panel if any further diarrhea.   Code Status:     Code Status Orders  (From admission, onward)         Start     Ordered   08/21/18 1139  Full code  Continuous     08/21/18 1139        Code Status History    Date Active Date Inactive Code Status Order ID Comments User Context   12/12/2016 0555 12/14/2016 1630 Full Code [id number]  Saundra Shelling, MD ED     Family Communication: Left message for husband on the phone Disposition Plan: To be determined  Consultants:  Nephrology  Vascular surgery  Procedures:  Dialysis catheter  Time spent: 27 minutes  [place]

## 2018-08-23 NOTE — Progress Notes (Signed)
Pre HD assessment    08/23/18 0953  Neurological  Level of Consciousness Alert  Orientation Level Oriented X4  Respiratory  Respiratory Pattern Regular;Unlabored  Chest Assessment Chest expansion symmetrical  Cardiac  Pulse Regular  ECG Monitor Yes  Cardiac Rhythm SB;NSR  Vascular  R Radial Pulse +2  L Radial Pulse +2  Edema Generalized;Right lower extremity;Left lower extremity  Integumentary  Integumentary (WDL) X  Skin Color Appropriate for ethnicity  Musculoskeletal  Musculoskeletal (WDL) X  Generalized Weakness Yes  Assistive Device None  GU Assessment  Genitourinary (WDL) X  Genitourinary Symptoms  (HD)  Psychosocial  Psychosocial (WDL) WDL

## 2018-08-23 NOTE — Progress Notes (Signed)
Post HD assessment   08/23/18 1231  Neurological  Level of Consciousness Alert  Orientation Level Oriented X4  Respiratory  Respiratory Pattern Regular;Unlabored  Chest Assessment Chest expansion symmetrical  Cardiac  Pulse Regular  ECG Monitor Yes  Cardiac Rhythm SB  Vascular  R Radial Pulse +2  L Radial Pulse +2  Edema Generalized;Right lower extremity;Left lower extremity  Integumentary  Integumentary (WDL) X  Skin Color Appropriate for ethnicity  Musculoskeletal  Musculoskeletal (WDL) X  Generalized Weakness Yes  Assistive Device None  GU Assessment  Genitourinary (WDL) X  Genitourinary Symptoms  (HD)  Psychosocial  Psychosocial (WDL) WDL

## 2018-08-23 NOTE — Progress Notes (Signed)
[facility] [place], [place] 08/23/18  Subjective:   Patient states that she feels a little better.  Still anuric.  BUN/creatinine remain critically elevated Urine protein to creatinine ratio 6.4 g Tolerated her first dialysis treatment yesterday Seen during second dialysis treatment   HEMODIALYSIS FLOWSHEET:  Blood Flow Rate (mL/min): 300 mL/min Arterial Pressure (mmHg): -140 mmHg Venous Pressure (mmHg): 130 mmHg Transmembrane Pressure (mmHg): 50 mmHg Ultrafiltration Rate (mL/min): 200 mL/min Dialysate Flow Rate (mL/min): 500 ml/min Conductivity: Machine : 14 Conductivity: Machine : 14 Dialysis Fluid Bolus: Normal Saline Bolus Amount (mL): 250 mL Dialysate Change: 1K    Objective:  Vital signs in last 24 hours:  Temp:  [97.6 F (36.4 C)-98.4 F (36.9 C)] 98.4 F (36.9 C) (02/21 1256) Pulse Rate:  [47-68] 59 (02/21 1256) Resp:  [17-21] 20 (02/21 1256) BP: (145-207)/(69-173) 157/73 (02/21 1256) SpO2:  [93 %-100 %] 97 % (02/21 1256) Weight:  [240 kg-114.5 kg] 114.5 kg (02/21 1232)  Weight change: 3.973 kg Filed Weights   08/23/18 0459 08/23/18 0952 08/23/18 1232  Weight: 112 kg 114.3 kg 114.5 kg    Intake/Output:    Intake/Output Summary (Last 24 hours) at 08/23/2018 1449 Last data filed at 08/23/2018 1232 Gross per 24 hour  Intake 1629.16 ml  Output 2 ml  Net 1627.16 ml     Physical Exam: General:  No acute distress,  HEENT  anicteric, dry oral mucous membranes  Neck  supple  Pulm/lungs  normal breathing effort, clear to auscultation  CVS/Heart  no rub  Abdomen:   Soft, nontender  Extremities:  Trace edema  Neurologic:  Alert, oriented  Skin:  No acute rashes  Access:  Femoral dialysis catheter       Basic Metabolic Panel:  Recent Labs  Lab 08/21/18 0934 08/21/18 1926 08/22/18 0555 08/22/18 1239 08/23/18 0641  NA 135 136 136  --  139  K 6.5* 6.2* 7.1* 3.6 4.3  CL 105 106 106  --  106  CO2 12* 12* 11*  --  18*  GLUCOSE  96 97 94  --  110*  BUN 133* 137* 138*  --  92*  CREATININE 13.29* 13.51* 14.11*  --  11.13*  CALCIUM 8.8* 8.1* 7.9*  --  7.7*  PHOS  --  11.4*  --   --   --      CBC: Recent Labs  Lab 08/21/18 0934  WBC 10.7*  NEUTROABS 7.9*  HGB 11.8*  HCT 36.2  MCV 89.2  PLT 328      Lab Results  Component Value Date   HEPBSAG Negative 08/21/2018   HEPBSAB Non Reactive 08/21/2018      Microbiology:  Recent Results (from the past 240 hour(s))  Urine Culture     Status: None   Collection Time: 08/21/18  9:34 AM  Result Value Ref Range Status   Specimen Description   Final    URINE, RANDOM Performed at [facility], [address] [place], [place] [address]    Special Requests   Final    NONE Performed at [facility], [address], [place], [place] [address]    Culture   Final    NO GROWTH Performed at [facility], [address] [place], [place] [address]    Report Status 08/22/2018 FINAL  Final  MRSA PCR Screening     Status: None   Collection Time: 08/22/18  1:48 AM  Result Value Ref Range Status   MRSA by PCR  NEGATIVE NEGATIVE Final    Comment:        The GeneXpert MRSA Assay (FDA approved for NASAL specimens only), is one component of a comprehensive MRSA colonization surveillance program. It is not intended to diagnose MRSA infection nor to guide or monitor treatment for MRSA infections. Performed at [facility], [address] [place], [place] [address]   Gastrointestinal Panel by PCR , Stool     Status: None   Collection Time: 08/23/18 12:56 PM  Result Value Ref Range Status   Campylobacter species NOT DETECTED NOT DETECTED Final   Plesimonas shigelloides NOT DETECTED NOT DETECTED Final   Salmonella species NOT DETECTED NOT DETECTED Final   Yersinia enterocolitica NOT DETECTED NOT DETECTED Final   Vibrio species NOT DETECTED NOT DETECTED Final   Vibrio cholerae NOT DETECTED NOT DETECTED Final    Enteroaggregative E coli (EAEC) NOT DETECTED NOT DETECTED Final   Enteropathogenic E coli (EPEC) NOT DETECTED NOT DETECTED Final   Enterotoxigenic E coli (ETEC) NOT DETECTED NOT DETECTED Final   Shiga like toxin producing E coli (STEC) NOT DETECTED NOT DETECTED Final   Shigella/Enteroinvasive E coli (EIEC) NOT DETECTED NOT DETECTED Final   Cryptosporidium NOT DETECTED NOT DETECTED Final   Cyclospora cayetanensis NOT DETECTED NOT DETECTED Final   Entamoeba histolytica NOT DETECTED NOT DETECTED Final   Giardia lamblia NOT DETECTED NOT DETECTED Final   Adenovirus F40/41 NOT DETECTED NOT DETECTED Final   Astrovirus NOT DETECTED NOT DETECTED Final   Norovirus GI/GII NOT DETECTED NOT DETECTED Final   Rotavirus A NOT DETECTED NOT DETECTED Final   Sapovirus (I, II, IV, and V) NOT DETECTED NOT DETECTED Final    Comment: Performed at [facility], [address] [place], [place] [address]    Coagulation Studies: No results for input(s): LABPROT, INR in the last 72 hours.  Urinalysis: Recent Labs    08/21/18 0934  COLORURINE YELLOW*  LABSPEC 1.015  PHURINE 5.0  GLUCOSEU 50*  HGBUR LARGE*  BILIRUBINUR NEGATIVE  KETONESUR NEGATIVE  PROTEINUR 100*  NITRITE NEGATIVE  LEUKOCYTESUR NEGATIVE      Imaging: No results found.   Medications:    . Chlorhexidine Gluconate Cloth  6 each Topical Q0600  . heparin  5,000 Units Subcutaneous Q8H  . metoprolol tartrate  100 mg Oral BID   acetaminophen **OR** acetaminophen, butalbital-acetaminophen-caffeine, hydrALAZINE, ondansetron **OR** ondansetron (ZOFRAN) IV, polyethylene glycol  Assessment/ Plan:  53 y.o. Caucasian female with medical problems of bipolar disorder pretension, depression, anxiety, was admitted on 08/21/2018 with 2 weeks of headache, acute renal failure, severe hyperkalemia.   Acute kidney injury with severe hyperkalemia, oliguric,  -Cause of acute kidney injury is unclear.  Patient's baseline creatinine is 0.55  on December 14, 2016.  Renal imaging in the form of CT without IV contrast is negative for obstruction or stone.  It is likely patient developed severe ATN from volume depletion over the past few days due to decreased oral intake and nausea vomiting and diarrhea.  In addition, she was taking nonsteroidals regularly in the form of BC powders twice a day for chronic headaches. -Next hemodialysis anticipated for Saturday since patient remains anuric  Hyperkalemia Corrected with hemodialysis.  Proteinuria, hematuria, pyuria noted on urinalysis Urine culture is negative Urine protein to creatinine ratio is 6.4 g.  We will repeat when urine output improves Await screening serologies  .  So far results show ANA negative, hepatitis B and C studies negative, anti-GBM antibody negative, complements C3 elevated, C4  normal, free light chain ratio 2.04, HIV negative  Hyperphosphatemia Severe.  Phosphorus level 11.4.  CK level normal Follow low phosphorus/renal diet   LOS: 2 Mohammad Granade 2/21/20202:49 PM  [facility] [place], [place]  Note: This note was prepared with Dragon dictation. Any transcription errors are unintentional

## 2018-08-23 NOTE — Progress Notes (Signed)
HD tx start    08/23/18 0959  Vital Signs  Pulse Rate (!) 59  Pulse Rate Source Monitor  Resp 19  BP (!) 188/93  BP Location Left Arm  BP Method Automatic  Patient Position (if appropriate) Lying  Oxygen Therapy  SpO2 97 %  O2 Device Room Air  During Hemodialysis Assessment  Blood Flow Rate (mL/min) 300 mL/min  Arterial Pressure (mmHg) -110 mmHg  Venous Pressure (mmHg) 110 mmHg  Transmembrane Pressure (mmHg) 500 mmHg  Ultrafiltration Rate (mL/min) 200 mL/min  Dialysate Flow Rate (mL/min) 500 ml/min  Conductivity: Machine  14.1  HD Safety Checks Performed Yes  Dialysis Fluid Bolus Normal Saline  Bolus Amount (mL) 250 mL  Intra-Hemodialysis Comments Tx initiated  Hemodialysis Catheter Right Femoral vein Triple-lumen  Placement Date: 08/22/18   Placed prior to admission: No  Person Inserting Catheter: Dr. Lucky Cowboy  Orientation: Right  Access Location: Femoral vein  Hemodialysis Catheter Type: Triple-lumen  Blue Lumen Status Infusing  Red Lumen Status Infusing

## 2018-08-23 NOTE — Progress Notes (Signed)
Pre HD assessment   08/23/18 0952  Vital Signs  Temp 98.1 F (36.7 C)  Temp Source Oral  Pulse Rate 63  Pulse Rate Source Monitor  Resp 18  BP (!) 174/96  BP Location Left Arm  BP Method Automatic  Patient Position (if appropriate) Lying  Oxygen Therapy  SpO2 97 %  O2 Device Room Air  Pain Assessment  Pain Scale 0-10  Pain Score 2  Pain Type Acute pain  Pain Location Head  Pain Intervention(s) RN made aware  Dialysis Weight  Weight 114.3 kg  Type of Weight Pre-Dialysis  Time-Out for Hemodialysis  What Procedure? HD  Pt Identifiers(min of two) First/Last Name;MRN/Account#  Correct Site? Yes  Correct Side? Yes  Correct Procedure? Yes  Consents Verified? Yes  Rad Studies Available? N/A  Safety Precautions Reviewed? Yes  Engineer, civil (consulting) Number  (1A)  Station Number 1  UF/Alarm Test Passed  Conductivity: Meter 13.8  Conductivity: Machine  14  pH 7.4  Reverse Osmosis main  Normal Saline Lot Number [id number]  Dialyzer Lot Number [id number]  Disposable Set Lot Number [id number]  Machine Temperature 98.6 F (37 C)  Musician and Audible Yes  Blood Lines Intact and Secured Yes  Pre Treatment Patient Checks  Vascular access used during treatment Catheter  Hepatitis B Surface Antigen Results Negative  Date Hepatitis B Surface Antigen Drawn 08/21/18  Isolation Initiated Yes  Hepatitis B Surface Antibody  (<10)  Date Hepatitis B Surface Antibody Drawn 08/21/18  Hemodialysis Consent Verified Yes  Hemodialysis Standing Orders Initiated Yes  ECG (Telemetry) Monitor On Yes  Prime Ordered Normal Saline  Length of  DialysisTreatment -hour(s) 2.5 Hour(s)  Dialyzer Elisio 17H NR  Dialysate 3K, 2.5 Ca (potassium 4.3 on 08/23/18)  Dialysis Anticoagulant None  Dialysate Flow Ordered 500  Blood Flow Rate Ordered 300 mL/min  Ultrafiltration Goal 0 Liters  Dialysis Blood Pressure Support Ordered Normal Saline  Education / Care Plan  Dialysis Education Provided Yes   Documented Education in Care Plan Yes  Hemodialysis Catheter Right Femoral vein Triple-lumen  Placement Date: 08/22/18   Placed prior to admission: No  Person Inserting Catheter: Dr. Lucky Cowboy  Orientation: Right  Access Location: Femoral vein  Hemodialysis Catheter Type: Triple-lumen  Site Condition No complications  Dressing Status Clean;Dry;Intact  Drainage Description None

## 2018-08-24 ENCOUNTER — Inpatient Hospital Stay: Payer: BLUE CROSS/BLUE SHIELD

## 2018-08-24 LAB — RENAL FUNCTION PANEL
Albumin: 2.7 g/dL — ABNORMAL LOW (ref 3.5–5.0)
Anion gap: 12 (ref 5–15)
BUN: 66 mg/dL — ABNORMAL HIGH (ref 6–20)
CO2: 24 mmol/L (ref 22–32)
Calcium: 7.7 mg/dL — ABNORMAL LOW (ref 8.9–10.3)
Chloride: 102 mmol/L (ref 98–111)
Creatinine, Ser: 8.58 mg/dL — ABNORMAL HIGH (ref 0.44–1.00)
GFR calc Af Amer: 6 mL/min — ABNORMAL LOW (ref >60)
GFR calc non Af Amer: 5 mL/min — ABNORMAL LOW (ref >60)
GLUCOSE: 98 mg/dL (ref 70–99)
Phosphorus: 7.6 mg/dL — ABNORMAL HIGH (ref 2.5–4.6)
Potassium: 3.9 mmol/L (ref 3.5–5.1)
Sodium: 138 mmol/L (ref 135–145)

## 2018-08-24 LAB — CBC
HCT: 27.8 % — ABNORMAL LOW (ref 36.0–46.0)
Hemoglobin: 9.1 g/dL — ABNORMAL LOW (ref 12.0–15.0)
MCH: 28.5 pg (ref 26.0–34.0)
MCHC: 32.7 g/dL (ref 30.0–36.0)
MCV: 87.1 fL (ref 80.0–100.0)
Platelets: 268 10*3/uL (ref 150–400)
RBC: 3.19 MIL/uL — AB (ref 3.87–5.11)
RDW: 14.7 % (ref 11.5–15.5)
WBC: 9.8 10*3/uL (ref 4.0–10.5)
nRBC: 0 % (ref 0.0–0.2)

## 2018-08-24 NOTE — Progress Notes (Signed)
[place], [place] 08/24/18  Subjective:   Patient states that she feels a little better.  Able to eat without nausea or vomiting.   Still anuric.  BUN/creatinine are improving on dialysis but remain critically elevated Urine protein to creatinine ratio 6.4 g Tolerated her first dialysis treatment yesterday Seen during second dialysis treatment   Patient seen during dialysis Tolerating well   HEMODIALYSIS FLOWSHEET:  Blood Flow Rate (mL/min): 400 mL/min Arterial Pressure (mmHg): -200 mmHg Venous Pressure (mmHg): 200 mmHg Transmembrane Pressure (mmHg): 40 mmHg Ultrafiltration Rate (mL/min): 610 mL/min Dialysate Flow Rate (mL/min): 800 ml/min Conductivity: Machine : 14 Conductivity: Machine : 14 Dialysis Fluid Bolus: Normal Saline Bolus Amount (mL): 250 mL Dialysate Change: 1K Dialysate 3K    Objective:  Vital signs in last 24 hours:  Temp:  [98 F (36.7 C)-100.1 F (37.8 C)] 100.1 F (37.8 C) (02/22 1317) Pulse Rate:  [55-71] 63 (02/22 1317) Resp:  [14-24] 20 (02/22 1317) BP: (118-202)/(68-106) 168/79 (02/22 1317) SpO2:  [94 %-98 %] 95 % (02/22 1317) Weight:  [109.7 kg-111.1 kg] 109.7 kg (02/22 0845)  Weight change: 6 kg Filed Weights   08/23/18 1232 08/24/18 0415 08/24/18 0845  Weight: 114.5 kg 111.1 kg 109.7 kg    Intake/Output:    Intake/Output Summary (Last 24 hours) at 08/24/2018 1319 Last data filed at 08/24/2018 1215 Gross per 24 hour  Intake -  Output 1000 ml  Net -1000 ml     Physical Exam: General:  No acute distress,  HEENT  anicteric, dry oral mucous membranes  Neck  supple  Pulm/lungs  normal breathing effort, clear to auscultation  CVS/Heart  no rub  Abdomen:   Soft, nontender  Extremities:  + Lower extremity edema  Neurologic:  Alert, oriented  Skin:  No acute rashes  Access:  Femoral dialysis catheter       Basic Metabolic Panel:  Recent Labs  Lab 08/21/18 0934 08/21/18 1926 08/22/18 0555  08/22/18 1239 08/23/18 0641 08/24/18 0404  NA 135 136 136  --  139 138  K 6.5* 6.2* 7.1* 3.6 4.3 3.9  CL 105 106 106  --  106 102  CO2 12* 12* 11*  --  18* 24  GLUCOSE 96 97 94  --  110* 98  BUN 133* 137* 138*  --  92* 66*  CREATININE 13.29* 13.51* 14.11*  --  11.13* 8.58*  CALCIUM 8.8* 8.1* 7.9*  --  7.7* 7.7*  PHOS  --  11.4*  --   --   --  7.6*     CBC: Recent Labs  Lab 08/21/18 0934 08/24/18 0404  WBC 10.7* 9.8  NEUTROABS 7.9*  --   HGB 11.8* 9.1*  HCT 36.2 27.8*  MCV 89.2 87.1  PLT 328 268      Lab Results  Component Value Date   HEPBSAG Negative 08/21/2018   HEPBSAB Non Reactive 08/21/2018      Microbiology:  Recent Results (from the past 240 hour(s))  Urine Culture     Status: None   Collection Time: 08/21/18  9:34 AM  Result Value Ref Range Status   Specimen Description   Final    URINE, RANDOM Performed at [facility], [address] [place], [place] [address]    Special Requests   Final    NONE Performed at [facility], [address] [place], [place] [address]    Culture   Final    NO GROWTH Performed at [facility], [address], [place], [place] [address]    Report Status 08/22/2018 FINAL  Final  MRSA PCR Screening     Status: None   Collection Time: 08/22/18  1:48 AM  Result Value Ref Range Status   MRSA by PCR NEGATIVE NEGATIVE Final    Comment:        The GeneXpert MRSA Assay (FDA approved for NASAL specimens only), is one component of a comprehensive MRSA colonization surveillance program. It is not intended to diagnose MRSA infection nor to guide or monitor treatment for MRSA infections. Performed at [facility], [address] [place], [place] [address]   Gastrointestinal Panel by PCR , Stool     Status: None   Collection Time: 08/23/18 12:56 PM  Result Value Ref Range Status   Campylobacter species NOT DETECTED NOT DETECTED Final   Plesimonas shigelloides NOT DETECTED NOT  DETECTED Final   Salmonella species NOT DETECTED NOT DETECTED Final   Yersinia enterocolitica NOT DETECTED NOT DETECTED Final   Vibrio species NOT DETECTED NOT DETECTED Final   Vibrio cholerae NOT DETECTED NOT DETECTED Final   Enteroaggregative E coli (EAEC) NOT DETECTED NOT DETECTED Final   Enteropathogenic E coli (EPEC) NOT DETECTED NOT DETECTED Final   Enterotoxigenic E coli (ETEC) NOT DETECTED NOT DETECTED Final   Shiga like toxin producing E coli (STEC) NOT DETECTED NOT DETECTED Final   Shigella/Enteroinvasive E coli (EIEC) NOT DETECTED NOT DETECTED Final   Cryptosporidium NOT DETECTED NOT DETECTED Final   Cyclospora cayetanensis NOT DETECTED NOT DETECTED Final   Entamoeba histolytica NOT DETECTED NOT DETECTED Final   Giardia lamblia NOT DETECTED NOT DETECTED Final   Adenovirus F40/41 NOT DETECTED NOT DETECTED Final   Astrovirus NOT DETECTED NOT DETECTED Final   Norovirus GI/GII NOT DETECTED NOT DETECTED Final   Rotavirus A NOT DETECTED NOT DETECTED Final   Sapovirus (I, II, IV, and V) NOT DETECTED NOT DETECTED Final    Comment: Performed at [facility], [address] [place],  [address]    Coagulation Studies: No results for input(s): LABPROT, INR in the last 72 hours.  Urinalysis: No results for input(s): COLORURINE, LABSPEC, PHURINE, GLUCOSEU, HGBUR, BILIRUBINUR, KETONESUR, PROTEINUR, UROBILINOGEN, NITRITE, LEUKOCYTESUR in the last 72 hours.  Invalid input(s): APPERANCEUR    Imaging: No results found.   Medications:    . Chlorhexidine Gluconate Cloth  6 each Topical Q0600  . heparin  5,000 Units Subcutaneous Q8H  . metoprolol tartrate  100 mg Oral BID   acetaminophen **OR** acetaminophen, butalbital-acetaminophen-caffeine, hydrALAZINE, ondansetron **OR** ondansetron (ZOFRAN) IV, polyethylene glycol  Assessment/ Plan:  53 y.o. Caucasian female with medical problems of bipolar disorder pretension, depression, anxiety, was admitted on 08/21/2018  with 2 weeks of headache, acute renal failure, severe hyperkalemia.   Acute kidney injury with severe hyperkalemia, oliguric,  -Cause of acute kidney injury is unclear.  Patient's baseline creatinine is 0.55 on December 14, 2016.  Renal imaging in the form of CT without IV contrast is negative for obstruction or stone.  It is likely patient developed severe ATN from volume depletion over the past few days due to decreased oral intake and nausea vomiting and diarrhea.  In addition, she was taking nonsteroidals regularly in the form of BC powders twice a day for chronic headaches.  -Patient has undergone daily dialysis to improve uremia -She remains anuric.  Plan for tunneled dialysis catheter placement Monday or Tuesday if renal function does not improve  Hyperkalemia Corrected with hemodialysis.  Proteinuria,  hematuria, pyuria noted on urinalysis Urine culture is negative Urine protein to creatinine ratio is 6.4 g.  We will repeat when urine output improves Await screening serologies  .  So far results show ANA negative, hepatitis B and C studies negative, anti-GBM antibody negative, complements C3 elevated, C4 normal, free light chain ratio 2.04, HIV negative.  ANCA antibodies pending Small monoclonal spike in SPEP.  Obtain skeletal survey  Hyperphosphatemia Severe.  Phosphorus level initially 11.4->7.6.  CK level normal Follow low phosphorus/renal diet   LOS: 3 Terrian Sentell 2/22/20201:19 PM  [place], [place]  Note: This note was prepared with Dragon dictation. Any transcription errors are unintentional

## 2018-08-24 NOTE — Progress Notes (Signed)
[place] [facility] [place] NAME: Wyatt Galvan    MR#:  [id number]  DATE OF BIRTH:  06/08/1966  SUBJECTIVE:  CHIEF COMPLAINT:   Chief Complaint  Patient presents with  . Headache  . Epistaxis  Patient seen and evaluated today No new episodes of diarrhea No abdominal pain Tolerating diet okay Due for dialysis  REVIEW OF SYSTEMS:    ROS  CONSTITUTIONAL: No documented fever. No fatigue, weakness. No weight gain, no weight loss.  EYES: No blurry or double vision.  ENT: No tinnitus. No postnasal drip. No redness of the oropharynx.  RESPIRATORY: No cough, no wheeze, no hemoptysis. No dyspnea.  CARDIOVASCULAR: No chest pain. No orthopnea. No palpitations. No syncope.  GASTROINTESTINAL: No nausea, no vomiting or diarrhea. No abdominal pain. No melena or hematochezia.  GENITOURINARY: No dysuria or hematuria.  ENDOCRINE: No polyuria or nocturia. No heat or cold intolerance.  HEMATOLOGY: No anemia. No bruising. No bleeding.  INTEGUMENTARY: No rashes. No lesions.  MUSCULOSKELETAL: No arthritis. No swelling. No gout.  NEUROLOGIC: No numbness, tingling, or ataxia. No seizure-type activity.  PSYCHIATRIC: No anxiety. No insomnia. No ADD.   DRUG ALLERGIES:  No Known Allergies  VITALS:  Blood pressure (!) 174/76, pulse (!) 57, temperature 98 F (36.7 C), temperature source Oral, resp. rate 20, height 5\' 3"  (1.6 m), weight 109.7 kg, last menstrual period 04/09/2018, SpO2 96 %.  PHYSICAL EXAMINATION:   Physical Exam  GENERAL:  53 y.o.-year-old patient lying in the bed with no acute distress.  EYES: Pupils equal, round, reactive to light and accommodation. No scleral icterus. Extraocular muscles intact.  HEENT: Head atraumatic, normocephalic. Oropharynx and nasopharynx clear.  NECK:  Supple, no jugular venous distention. No thyroid enlargement, no tenderness.  LUNGS: Normal breath sounds bilaterally, no wheezing, rales, rhonchi. No use of accessory  muscles of respiration.  CARDIOVASCULAR: S1, S2 normal. No murmurs, rubs, or gallops.  ABDOMEN: Soft, nontender, nondistended. Bowel sounds present. No organomegaly or mass.  EXTREMITIES: No cyanosis, clubbing or edema b/l.    NEUROLOGIC: Cranial nerves II through XII are intact. No focal Motor or sensory deficits b/l.   PSYCHIATRIC: The patient is alert and oriented x 3.  SKIN: No obvious rash, lesion, or ulcer.   LABORATORY PANEL:   CBC Recent Labs  Lab 08/24/18 0404  WBC 9.8  HGB 9.1*  HCT 27.8*  PLT 268   ------------------------------------------------------------------------------------------------------------------ Chemistries  Recent Labs  Lab 08/21/18 0934  08/24/18 0404  NA 135   < > 138  K 6.5*   < > 3.9  CL 105   < > 102  CO2 12*   < > 24  GLUCOSE 96   < > 98  BUN 133*   < > 66*  CREATININE 13.29*   < > 8.58*  CALCIUM 8.8*   < > 7.7*  AST 20  --   --   ALT 44  --   --   ALKPHOS 198*  --   --   BILITOT 0.8  --   --    < > = values in this interval not displayed.   ------------------------------------------------------------------------------------------------------------------  Cardiac Enzymes No results for input(s): TROPONINI in the last 168 hours. ------------------------------------------------------------------------------------------------------------------  RADIOLOGY:  No results found.   ASSESSMENT AND PLAN:   53 year old female patient with history of hypertension, bipolar disorder, OCD, tachycardia, vertigo currently under hospitalist service for acute renal failure.  -Acute kidney injury oliguria On dialysis Dialysis session today Creatinine improving No  signs of uremia  -Obesity Weight loss counseling given  -Diarrhea self-limiting  -Hypertension Continue oral metoprolol for control of blood pressure  -DVT prophylaxis subcu heparin daily  All the records are reviewed and case discussed with Care Management/Social  Worker. Management plans discussed with the patient, family and they are in agreement.  CODE STATUS: Full code  DVT Prophylaxis: SCDs  TOTAL TIME TAKING CARE OF THIS PATIENT: 36 minutes.   POSSIBLE D/C IN 2 to 3 DAYS, DEPENDING ON CLINICAL CONDITION.  Saundra Shelling M.D on 08/24/2018 at 11:03 AM  Between 7am to 6pm - Pager - [phone]  After 6pm go to [email or web address] - password [email or web address] [facility]  Office  [phone]  CC: Primary care physician; Barbaraann Boys, MD  Note: This dictation was prepared with Dragon dictation along with smaller phrase technology. Any transcriptional errors that result from this process are unintentional.

## 2018-08-25 LAB — BASIC METABOLIC PANEL
Anion gap: 10 (ref 5–15)
BUN: 42 mg/dL — ABNORMAL HIGH (ref 6–20)
CO2: 29 mmol/L (ref 22–32)
Calcium: 7.8 mg/dL — ABNORMAL LOW (ref 8.9–10.3)
Chloride: 100 mmol/L (ref 98–111)
Creatinine, Ser: 6.46 mg/dL — ABNORMAL HIGH (ref 0.44–1.00)
GFR calc Af Amer: 8 mL/min — ABNORMAL LOW (ref >60)
GFR calc non Af Amer: 7 mL/min — ABNORMAL LOW (ref >60)
Glucose, Bld: 93 mg/dL (ref 70–99)
Potassium: 3.9 mmol/L (ref 3.5–5.1)
Sodium: 139 mmol/L (ref 135–145)

## 2018-08-25 LAB — QUANTIFERON-TB GOLD PLUS: QuantiFERON-TB Gold Plus: NEGATIVE

## 2018-08-25 LAB — QUANTIFERON-TB GOLD PLUS (RQFGPL)
QuantiFERON Mitogen Value: 1.21 IU/mL
QuantiFERON Nil Value: 0.04 IU/mL
QuantiFERON TB1 Ag Value: 0.04 IU/mL
QuantiFERON TB2 Ag Value: 0.03 IU/mL

## 2018-08-25 NOTE — Progress Notes (Signed)
[place], [place] 08/25/18  Subjective:   Patient states that she feels a little better.  Able to eat without nausea or vomiting.   Still anuric.  BUN/creatinine are improving on dialysis but remain critically elevated Urine protein to creatinine ratio 6.4 g Patient complaining of loose stools   Objective:  Vital signs in last 24 hours:  Temp:  [98.8 F (37.1 C)-100.2 F (37.9 C)] 98.8 F (37.1 C) (02/23 1201) Pulse Rate:  [58-65] 58 (02/23 1201) Resp:  [16-19] 16 (02/23 1201) BP: (145-179)/(77-91) 159/84 (02/23 1201) SpO2:  [95 %-96 %] 95 % (02/23 1201)  Weight change: -4.6 kg Filed Weights   08/23/18 1232 08/24/18 0415 08/24/18 0845  Weight: 114.5 kg 111.1 kg 109.7 kg    Intake/Output:    Intake/Output Summary (Last 24 hours) at 08/25/2018 1621 Last data filed at 08/24/2018 1908 Gross per 24 hour  Intake 240 ml  Output -  Net 240 ml     Physical Exam: General:  No acute distress,  HEENT  anicteric, dry oral mucous membranes  Neck  supple  Pulm/lungs  normal breathing effort, clear to auscultation  CVS/Heart  no rub  Abdomen:   Soft, nontender  Extremities:  + lower extremity edema  Neurologic:  Alert, oriented  Skin:  No acute rashes  Access:  Femoral dialysis catheter       Basic Metabolic Panel:  Recent Labs  Lab 08/21/18 1926 08/22/18 0555 08/22/18 1239 08/23/18 0641 08/24/18 0404 08/25/18 0511  NA 136 136  --  139 138 139  K 6.2* 7.1* 3.6 4.3 3.9 3.9  CL 106 106  --  106 102 100  CO2 12* 11*  --  18* 24 29  GLUCOSE 97 94  --  110* 98 93  BUN 137* 138*  --  92* 66* 42*  CREATININE 13.51* 14.11*  --  11.13* 8.58* 6.46*  CALCIUM 8.1* 7.9*  --  7.7* 7.7* 7.8*  PHOS 11.4*  --   --   --  7.6*  --      CBC: Recent Labs  Lab 08/21/18 0934 08/24/18 0404  WBC 10.7* 9.8  NEUTROABS 7.9*  --   HGB 11.8* 9.1*  HCT 36.2 27.8*  MCV 89.2 87.1  PLT 328 268      Lab Results  Component Value Date   HEPBSAG  Negative 08/21/2018   HEPBSAB Non Reactive 08/21/2018      Microbiology:  Recent Results (from the past 240 hour(s))  Urine Culture     Status: None   Collection Time: 08/21/18  9:34 AM  Result Value Ref Range Status   Specimen Description   Final    URINE, RANDOM Performed at [facility], [address] [place], [place] [address]    Special Requests   Final    NONE Performed at [facility], [address], [place], [place] [address]    Culture   Final    NO GROWTH Performed at [facility], [address] [place], [place] [address]    Report Status 08/22/2018 FINAL  Final  MRSA PCR Screening     Status: None   Collection Time: 08/22/18  1:48 AM  Result Value Ref Range Status   MRSA by PCR NEGATIVE NEGATIVE Final    Comment:        The GeneXpert MRSA Assay (FDA approved for NASAL specimens only), is one component of a comprehensive MRSA colonization surveillance program. It is not intended  to diagnose MRSA infection nor to guide or monitor treatment for MRSA infections. Performed at [facility], [address] [place], [place] [address]   Gastrointestinal Panel by PCR , Stool     Status: None   Collection Time: 08/23/18 12:56 PM  Result Value Ref Range Status   Campylobacter species NOT DETECTED NOT DETECTED Final   Plesimonas shigelloides NOT DETECTED NOT DETECTED Final   Salmonella species NOT DETECTED NOT DETECTED Final   Yersinia enterocolitica NOT DETECTED NOT DETECTED Final   Vibrio species NOT DETECTED NOT DETECTED Final   Vibrio cholerae NOT DETECTED NOT DETECTED Final   Enteroaggregative E coli (EAEC) NOT DETECTED NOT DETECTED Final   Enteropathogenic E coli (EPEC) NOT DETECTED NOT DETECTED Final   Enterotoxigenic E coli (ETEC) NOT DETECTED NOT DETECTED Final   Shiga like toxin producing E coli (STEC) NOT DETECTED NOT DETECTED Final   Shigella/Enteroinvasive E coli (EIEC) NOT DETECTED NOT DETECTED Final    Cryptosporidium NOT DETECTED NOT DETECTED Final   Cyclospora cayetanensis NOT DETECTED NOT DETECTED Final   Entamoeba histolytica NOT DETECTED NOT DETECTED Final   Giardia lamblia NOT DETECTED NOT DETECTED Final   Adenovirus F40/41 NOT DETECTED NOT DETECTED Final   Astrovirus NOT DETECTED NOT DETECTED Final   Norovirus GI/GII NOT DETECTED NOT DETECTED Final   Rotavirus A NOT DETECTED NOT DETECTED Final   Sapovirus (I, II, IV, and V) NOT DETECTED NOT DETECTED Final    Comment: Performed at [facility], [address] [place], [place] [address]    Coagulation Studies: No results for input(s): LABPROT, INR in the last 72 hours.  Urinalysis: No results for input(s): COLORURINE, LABSPEC, PHURINE, GLUCOSEU, HGBUR, BILIRUBINUR, KETONESUR, PROTEINUR, UROBILINOGEN, NITRITE, LEUKOCYTESUR in the last 72 hours.  Invalid input(s): APPERANCEUR    Imaging: Dg Bone Survey Met  Result Date: 08/24/2018 CLINICAL DATA:  MGUS EXAM: METASTATIC BONE SURVEY COMPARISON:  None. FINDINGS: Cardiomegaly with vascular congestion.  No overt edema or effusions. No acute bony abnormality or focal lytic lesion within the visualized skeleton. Probable old healed fracture in the distal left fibula. Dialysis catheter noted in the right groin with the tip in the lower IVC. IMPRESSION: No acute bony abnormality or focal lytic lesion. Cardiomegaly, vascular congestion. Electronically Signed   By: Rolm Baptise M.D.   On: 08/24/2018 15:46     Medications:    . Chlorhexidine Gluconate Cloth  6 each Topical Q0600  . heparin  5,000 Units Subcutaneous Q8H  . metoprolol tartrate  100 mg Oral BID   acetaminophen **OR** acetaminophen, butalbital-acetaminophen-caffeine, hydrALAZINE, ondansetron **OR** ondansetron (ZOFRAN) IV, polyethylene glycol  Assessment/ Plan:  53 y.o. Caucasian female with medical problems of bipolar disorder pretension, depression, anxiety, was admitted on 08/21/2018 with 2 weeks of headache,  acute renal failure, severe hyperkalemia.   Acute kidney injury with severe hyperkalemia, oliguric,  -Cause of acute kidney injury is unclear.  Patient's baseline creatinine is 0.55 on December 14, 2016.  Renal imaging in the form of CT without IV contrast is negative for obstruction or stone.  It is likely patient developed severe ATN from volume depletion over the past few days due to decreased oral intake and nausea vomiting and diarrhea.  In addition, she was taking nonsteroidals regularly in the form of BC powders twice a day for chronic headaches.  -Patient has undergone daily dialysis to improve uremia -She remains anuric.  Plan for tunneled dialysis catheter placement Monday or Tuesday if renal function does not improve.  First hemodialysis was on February 20  Hyperkalemia Corrected with hemodialysis.  Proteinuria, hematuria, pyuria noted on urinalysis Urine culture is negative Urine protein to creatinine ratio is 6.4 g.  We will repeat when urine output improves Await screening serologies  .  So far results show ANA negative, hepatitis B and C studies negative, anti-GBM antibody negative, complements C3 elevated, C4 normal, free light chain ratio 2.04, HIV negative.  ANCA antibodies pending Small monoclonal spike in SPEP.  skeletal survey is negative  Hyperphosphatemia Severe.  Phosphorus level initially 11.4->7.6.  CK level normal Follow low phosphorus/renal diet   LOS: 4 Ruie Sendejo 2/23/20204:21 PM  [place], [place]  Note: This note was prepared with Dragon dictation. Any transcription errors are unintentional

## 2018-08-25 NOTE — Progress Notes (Signed)
[place] at [place] NAME: Jennifer Newman    MR#:  [id number]  DATE OF BIRTH:  11/13/1965  SUBJECTIVE:  CHIEF COMPLAINT:   Chief Complaint  Patient presents with  . Headache  . Epistaxis  Patient seen and evaluated today No new episodes of diarrhea No abdominal pain No nausea and vomiting Tolerating diet okay Due for dialysis  REVIEW OF SYSTEMS:    ROS  CONSTITUTIONAL: No documented fever. No fatigue, weakness. No weight gain, no weight loss.  EYES: No blurry or double vision.  ENT: No tinnitus. No postnasal drip. No redness of the oropharynx.  RESPIRATORY: No cough, no wheeze, no hemoptysis. No dyspnea.  CARDIOVASCULAR: No chest pain. No orthopnea. No palpitations. No syncope.  GASTROINTESTINAL: No nausea, no vomiting or diarrhea. No abdominal pain. No melena or hematochezia.  GENITOURINARY: No dysuria or hematuria.  ENDOCRINE: No polyuria or nocturia. No heat or cold intolerance.  HEMATOLOGY: No anemia. No bruising. No bleeding.  INTEGUMENTARY: No rashes. No lesions.  MUSCULOSKELETAL: No arthritis. No swelling. No gout.  NEUROLOGIC: No numbness, tingling, or ataxia. No seizure-type activity.  PSYCHIATRIC: No anxiety. No insomnia. No ADD.   DRUG ALLERGIES:  No Known Allergies  VITALS:  Blood pressure (!) 145/77, pulse 65, temperature 100.2 F (37.9 C), temperature source Oral, resp. rate 19, height 5' 3"  (1.6 m), weight 109.7 kg, last menstrual period 04/09/2018, SpO2 96 %.  PHYSICAL EXAMINATION:   Physical Exam  GENERAL:  53 y.o.-year-old patient lying in the bed with no acute distress.  EYES: Pupils equal, round, reactive to light and accommodation. No scleral icterus. Extraocular muscles intact.  HEENT: Head atraumatic, normocephalic. Oropharynx and nasopharynx clear.  NECK:  Supple, no jugular venous distention. No thyroid enlargement, no tenderness.  LUNGS: Normal breath sounds bilaterally, no wheezing, rales,  rhonchi. No use of accessory muscles of respiration.  CARDIOVASCULAR: S1, S2 normal. No murmurs, rubs, or gallops.  ABDOMEN: Soft, nontender, nondistended. Bowel sounds present. No organomegaly or mass.  EXTREMITIES: No cyanosis, clubbing or edema b/l.    NEUROLOGIC: Cranial nerves II through XII are intact. No focal Motor or sensory deficits b/l.   PSYCHIATRIC: The patient is alert and oriented x 3.  SKIN: No obvious rash, lesion, or ulcer.   LABORATORY PANEL:   CBC Recent Labs  Lab 08/24/18 0404  WBC 9.8  HGB 9.1*  HCT 27.8*  PLT 268   ------------------------------------------------------------------------------------------------------------------ Chemistries  Recent Labs  Lab 08/21/18 0934  08/25/18 0511  NA 135   < > 139  K 6.5*   < > 3.9  CL 105   < > 100  CO2 12*   < > 29  GLUCOSE 96   < > 93  BUN 133*   < > 42*  CREATININE 13.29*   < > 6.46*  CALCIUM 8.8*   < > 7.8*  AST 20  --   --   ALT 44  --   --   ALKPHOS 198*  --   --   BILITOT 0.8  --   --    < > = values in this interval not displayed.   ------------------------------------------------------------------------------------------------------------------  Cardiac Enzymes No results for input(s): TROPONINI in the last 168 hours. ------------------------------------------------------------------------------------------------------------------  RADIOLOGY:  Dg Bone Survey Met  Result Date: 08/24/2018 CLINICAL DATA:  MGUS EXAM: METASTATIC BONE SURVEY COMPARISON:  None. FINDINGS: Cardiomegaly with vascular congestion.  No overt edema or effusions. No acute bony abnormality or focal lytic lesion within the  visualized skeleton. Probable old healed fracture in the distal left fibula. Dialysis catheter noted in the right groin with the tip in the lower IVC. IMPRESSION: No acute bony abnormality or focal lytic lesion. Cardiomegaly, vascular congestion. Electronically Signed   By: Rolm Baptise M.D.   On: 08/24/2018  15:46     ASSESSMENT AND PLAN:   53 year old female patient with history of hypertension, bipolar disorder, OCD, tachycardia, vertigo currently under hospitalist service for acute renal failure.  -Acute kidney injury with severe oliguria On dialysis Creatinine improved with dialysis but still critical Nephrology follow-up No signs of uremia Possible ATN being the cause  -Obesity Weight loss counseling given  -Diarrhea self-limiting  -Hypertension Continue oral metoprolol for control of blood pressure  -DVT prophylaxis subcu heparin daily  All the records are reviewed and case discussed with Care Management/Social Worker. Management plans discussed with the patient, family and they are in agreement.  CODE STATUS: Full code  DVT Prophylaxis: SCDs  TOTAL TIME TAKING CARE OF THIS PATIENT: 34 minutes.   POSSIBLE D/C IN 2 to 3 DAYS, DEPENDING ON CLINICAL CONDITION.  Saundra Shelling M.D on 08/25/2018 at 10:13 AM  Between 7am to 6pm - Pager - [phone]  After 6pm go to [email or web address] - password [email or web address] [facility]  Office  [phone]  CC: Primary care physician; Barbaraann Boys, MD  Note: This dictation was prepared with Dragon dictation along with smaller phrase technology. Any transcriptional errors that result from this process are unintentional.

## 2018-08-26 ENCOUNTER — Encounter
Admission: EM | Disposition: A | Discharge status: Other Acute Inpatient Hospital | Payer: Self-pay | Source: Home / Self Care | Attending: Internal Medicine

## 2018-08-26 DIAGNOSIS — N186 End stage renal disease: Secondary | ICD-10-CM

## 2018-08-26 HISTORY — PX: DIALYSIS/PERMA CATHETER INSERTION: CATH118288

## 2018-08-26 LAB — BASIC METABOLIC PANEL
Anion gap: 14 (ref 5–15)
BUN: 60 mg/dL — ABNORMAL HIGH (ref 6–20)
CHLORIDE: 98 mmol/L (ref 98–111)
CO2: 26 mmol/L (ref 22–32)
Calcium: 8 mg/dL — ABNORMAL LOW (ref 8.9–10.3)
Creatinine, Ser: 8.59 mg/dL — ABNORMAL HIGH (ref 0.44–1.00)
GFR calc Af Amer: 6 mL/min — ABNORMAL LOW (ref >60)
GFR calc non Af Amer: 5 mL/min — ABNORMAL LOW (ref >60)
Glucose, Bld: 90 mg/dL (ref 70–99)
POTASSIUM: 4 mmol/L (ref 3.5–5.1)
Sodium: 138 mmol/L (ref 135–145)

## 2018-08-26 LAB — ANCA TITERS
Atypical P-ANCA titer: <1:20 {titer}
C-ANCA: 1:160 {titer} — ABNORMAL HIGH
P-ANCA: <1:20 {titer}

## 2018-08-26 SURGERY — DIALYSIS/PERMA CATHETER INSERTION
Anesthesia: Moderate Sedation

## 2018-08-26 MED ORDER — FENTANYL CITRATE (PF) 100 MCG/2ML IJ SOLN
INTRAMUSCULAR | Status: DC | PRN
  Administered 2018-08-26: 50 ug via INTRAVENOUS

## 2018-08-26 MED ORDER — HYDRALAZINE HCL 20 MG/ML IJ SOLN
INTRAMUSCULAR | Status: AC
  Filled 2018-08-26: qty 1

## 2018-08-26 MED ORDER — MIDAZOLAM HCL 5 MG/5ML IJ SOLN
INTRAMUSCULAR | Status: AC
  Filled 2018-08-26: qty 5

## 2018-08-26 MED ORDER — LABETALOL HCL 5 MG/ML IV SOLN
INTRAVENOUS | Status: DC | PRN
  Administered 2018-08-26: 10 mg via INTRAVENOUS

## 2018-08-26 MED ORDER — LIDOCAINE-EPINEPHRINE (PF) 1 %-1:200000 IJ SOLN
INTRAMUSCULAR | Status: AC
  Filled 2018-08-26: qty 30

## 2018-08-26 MED ORDER — CEFAZOLIN SODIUM-DEXTROSE 1-4 GM/50ML-% IV SOLN
1.0000 g | Freq: Once | INTRAVENOUS | Status: DC
  Administered 2018-08-26: 1 g via INTRAVENOUS

## 2018-08-26 MED ORDER — LABETALOL HCL 5 MG/ML IV SOLN
INTRAVENOUS | Status: AC
  Filled 2018-08-26: qty 4

## 2018-08-26 MED ORDER — MIDAZOLAM HCL 2 MG/2ML IJ SOLN
INTRAMUSCULAR | Status: DC | PRN
  Administered 2018-08-26: 1 mg via INTRAVENOUS
  Administered 2018-08-26: 2 mg via INTRAVENOUS

## 2018-08-26 MED ORDER — CEFAZOLIN SODIUM-DEXTROSE 1-4 GM/50ML-% IV SOLN
INTRAVENOUS | Status: AC
  Filled 2018-08-26: qty 50

## 2018-08-26 MED ORDER — HEPARIN (PORCINE) IN NACL 1000-0.9 UT/500ML-% IV SOLN
INTRAVENOUS | Status: AC
  Filled 2018-08-26: qty 500

## 2018-08-26 MED ORDER — SODIUM CHLORIDE 0.9 % IV SOLN: INTRAVENOUS | Status: DC

## 2018-08-26 MED ORDER — FENTANYL CITRATE (PF) 100 MCG/2ML IJ SOLN
INTRAMUSCULAR | Status: AC
  Filled 2018-08-26: qty 2

## 2018-08-26 MED ORDER — HEPARIN SODIUM (PORCINE) 10000 UNIT/ML IJ SOLN
INTRAMUSCULAR | Status: AC
  Filled 2018-08-26: qty 1

## 2018-08-26 SURGICAL SUPPLY — 8 items
ADH SKN CLS APL DERMABOND .7 (GAUZE/BANDAGES/DRESSINGS) ×1
CATH PALINDROME RT-P 15FX19CM (CATHETERS) ×1 IMPLANT
COVER PROBE U/S 5X48 (MISCELLANEOUS) ×1 IMPLANT
DERMABOND ADVANCED (GAUZE/BANDAGES/DRESSINGS) ×1
DERMABOND ADVANCED .7 DNX12 (GAUZE/BANDAGES/DRESSINGS) IMPLANT
PACK ANGIOGRAPHY (CUSTOM PROCEDURE TRAY) ×1 IMPLANT
SUT MNCRL AB 4-0 PS2 18 (SUTURE) ×1 IMPLANT
SUT PROLENE 0 CT 1 30 (SUTURE) ×1 IMPLANT

## 2018-08-26 NOTE — Progress Notes (Signed)
Tunnel dialysis catheter placed.  Still has very low urine output.  Consider dialysis again tomorrow.

## 2018-08-26 NOTE — H&P (Signed)
[facility] History & Physical Update  The patient was interviewed and re-examined.  The patient's previous History and Physical has been reviewed and is unchanged.  There is no change in the plan of care. We plan to proceed with the scheduled procedure.  Leotis Pain, MD  08/26/2018, 11:56 AM

## 2018-08-26 NOTE — Op Note (Signed)
OPERATIVE NOTE    PRE-OPERATIVE DIAGNOSIS: 1. ESRD   POST-OPERATIVE DIAGNOSIS: same as above  PROCEDURE: 1. Ultrasound guidance for vascular access to the right internal jugular vein 2. Fluoroscopic guidance for placement of catheter 3. Placement of a 19 cm tip to cuff tunneled hemodialysis catheter via the right internal jugular vein  SURGEON: Leotis Pain, MD  ANESTHESIA:  Local with Moderate conscious sedation for approximately 15 minutes using 3 mg of Versed and 50 mcg of Fentanyl  ESTIMATED BLOOD LOSS: 10 cc  FLUORO TIME: less than one minute  CONTRAST: none  FINDING(S): 1.  Patent right internal jugular vein  SPECIMEN(S):  None  INDICATIONS:   Jennifer Newman is a 53 y.o.female who presents with renal failure.  The patient needs long term dialysis access for their ESRD, and a Permcath is necessary.  Risks and benefits are discussed and informed consent is obtained.    DESCRIPTION: After obtaining full informed written consent, the patient was brought back to the vascular suited. The patient's right neck and chest were sterilely prepped and draped in a sterile surgical field was created. Moderate conscious sedation was administered during a face to face encounter with the patient throughout the procedure with my supervision of the RN administering medicines and monitoring the patient's vital signs, pulse oximetry, telemetry and mental status throughout from the start of the procedure until the patient was taken to the recovery room.  The right internal jugular vein was visualized with ultrasound and found to be patent. It was then accessed under direct ultrasound guidance and a permanent image was recorded. A wire was placed. After skin nick and dilatation, the peel-away sheath was placed over the wire. I then turned my attention to an area under the clavicle. Approximately 1-2 fingerbreadths below the clavicle a small counterincision was created and tunneled from the  subclavicular incision to the access site. Using fluoroscopic guidance, a 19 centimeter tip to cuff tunneled hemodialysis catheter was selected, and tunneled from the subclavicular incision to the access site. It was then placed through the peel-away sheath and the peel-away sheath was removed. Using fluoroscopic guidance the catheter tips were parked in the right atrium. The appropriate distal connectors were placed. It withdrew blood well and flushed easily with heparinized saline and a concentrated heparin solution was then placed. It was secured to the chest wall with 2 Prolene sutures. The access incision was closed single 4-0 Monocryl. A 4-0 Monocryl pursestring suture was placed around the exit site. Sterile dressings were placed. The patient tolerated the procedure well and was taken to the recovery room in stable condition.  COMPLICATIONS: None  CONDITION: Stable  Leotis Pain, MD 08/26/2018 12:39 PM   This note was created with Dragon Medical transcription system. Any errors in dictation are purely unintentional.

## 2018-08-26 NOTE — Progress Notes (Signed)
[facility] [place] NAME: Jennifer Newman    MR#:  [id number]  DATE OF BIRTH:  1965/09/20  SUBJECTIVE:  CHIEF COMPLAINT:   Chief Complaint  Patient presents with  . Headache  . Epistaxis  Patient seen and evaluated today Creatinine still high Patient on dialysis No abdominal pain No nausea and vomiting Tolerating diet okay  REVIEW OF SYSTEMS:    ROS  CONSTITUTIONAL: No documented fever. No fatigue, weakness. No weight gain, no weight loss.  EYES: No blurry or double vision.  ENT: No tinnitus. No postnasal drip. No redness of the oropharynx.  RESPIRATORY: No cough, no wheeze, no hemoptysis. No dyspnea.  CARDIOVASCULAR: No chest pain. No orthopnea. No palpitations. No syncope.  GASTROINTESTINAL: No nausea, no vomiting or diarrhea. No abdominal pain. No melena or hematochezia.  GENITOURINARY: No dysuria or hematuria.  ENDOCRINE: No polyuria or nocturia. No heat or cold intolerance.  HEMATOLOGY: No anemia. No bruising. No bleeding.  INTEGUMENTARY: No rashes. No lesions.  MUSCULOSKELETAL: No arthritis. No swelling. No gout.  NEUROLOGIC: No numbness, tingling, or ataxia. No seizure-type activity.  PSYCHIATRIC: No anxiety. No insomnia. No ADD.   DRUG ALLERGIES:  No Known Allergies  VITALS:  Blood pressure (!) 154/80, pulse 63, temperature 98.4 F (36.9 C), temperature source Oral, resp. rate 19, height 5' 3"  (1.6 m), weight 109.7 kg, last menstrual period 04/09/2018, SpO2 95 %.  PHYSICAL EXAMINATION:   Physical Exam  GENERAL:  53 y.o.-year-old patient lying in the bed with no acute distress.  EYES: Pupils equal, round, reactive to light and accommodation. No scleral icterus. Extraocular muscles intact.  HEENT: Head atraumatic, normocephalic. Oropharynx and nasopharynx clear.  NECK:  Supple, no jugular venous distention. No thyroid enlargement, no tenderness.  LUNGS: Normal breath sounds bilaterally, no wheezing, rales, rhonchi. No  use of accessory muscles of respiration.  CARDIOVASCULAR: S1, S2 normal. No murmurs, rubs, or gallops.  ABDOMEN: Soft, nontender, nondistended. Bowel sounds present. No organomegaly or mass.  EXTREMITIES: No cyanosis, clubbing or edema b/l.    NEUROLOGIC: Cranial nerves II through XII are intact. No focal Motor or sensory deficits b/l.   PSYCHIATRIC: The patient is alert and oriented x 3.  SKIN: No obvious rash, lesion, or ulcer.   LABORATORY PANEL:   CBC Recent Labs  Lab 08/24/18 0404  WBC 9.8  HGB 9.1*  HCT 27.8*  PLT 268   ------------------------------------------------------------------------------------------------------------------ Chemistries  Recent Labs  Lab 08/21/18 0934  08/26/18 0433  NA 135   < > 138  K 6.5*   < > 4.0  CL 105   < > 98  CO2 12*   < > 26  GLUCOSE 96   < > 90  BUN 133*   < > 60*  CREATININE 13.29*   < > 8.59*  CALCIUM 8.8*   < > 8.0*  AST 20  --   --   ALT 44  --   --   ALKPHOS 198*  --   --   BILITOT 0.8  --   --    < > = values in this interval not displayed.   ------------------------------------------------------------------------------------------------------------------  Cardiac Enzymes No results for input(s): TROPONINI in the last 168 hours. ------------------------------------------------------------------------------------------------------------------  RADIOLOGY:  Dg Bone Survey Met  Result Date: 08/24/2018 CLINICAL DATA:  MGUS EXAM: METASTATIC BONE SURVEY COMPARISON:  None. FINDINGS: Cardiomegaly with vascular congestion.  No overt edema or effusions. No acute bony abnormality or focal lytic lesion within the visualized skeleton.  Probable old healed fracture in the distal left fibula. Dialysis catheter noted in the right groin with the tip in the lower IVC. IMPRESSION: No acute bony abnormality or focal lytic lesion. Cardiomegaly, vascular congestion. Electronically Signed   By: Rolm Baptise M.D.   On: 08/24/2018 15:46      ASSESSMENT AND PLAN:   53 year old female patient with history of hypertension, bipolar disorder, OCD, tachycardia, vertigo currently under hospitalist service for acute renal failure.  -Acute kidney injury with severe oliguria On dialysis Creatinine still critically high Plan for tunneled dialysis catheter placement. Nephrology follow-up appreciated No signs of uremia Possible ATN being the cause Vascular surgery follow-up for tunneled dialysis catheter placement  -Obesity Weight loss counseling given  -Diarrhea self-limiting  -Hypertension Continue oral metoprolol for control of blood pressure  -DVT prophylaxis subcu heparin daily  All the records are reviewed and case discussed with Care Management/Social Worker. Management plans discussed with the patient, family and they are in agreement.  CODE STATUS: Full code  DVT Prophylaxis: SCDs  TOTAL TIME TAKING CARE OF THIS PATIENT: 35 minutes.   POSSIBLE D/C IN 2 to 3 DAYS, DEPENDING ON CLINICAL CONDITION.  Saundra Shelling M.D on 08/26/2018 at 9:31 AM  Between 7am to 6pm - Pager - [phone]  After 6pm go to [email or web address] - password [email or web address] [facility]  Office  [phone]  CC: Primary care physician; Barbaraann Boys, MD  Note: This dictation was prepared with Dragon dictation along with smaller phrase technology. Any transcriptional errors that result from this process are unintentional.

## 2018-08-27 ENCOUNTER — Encounter: Payer: Self-pay | Admitting: Vascular Surgery

## 2018-08-27 LAB — BASIC METABOLIC PANEL
Anion gap: 13 (ref 5–15)
BUN: 75 mg/dL — ABNORMAL HIGH (ref 6–20)
CHLORIDE: 99 mmol/L (ref 98–111)
CO2: 25 mmol/L (ref 22–32)
Calcium: 8.1 mg/dL — ABNORMAL LOW (ref 8.9–10.3)
Creatinine, Ser: 10.2 mg/dL — ABNORMAL HIGH (ref 0.44–1.00)
GFR calc Af Amer: 5 mL/min — ABNORMAL LOW (ref >60)
GFR calc non Af Amer: 4 mL/min — ABNORMAL LOW (ref >60)
Glucose, Bld: 97 mg/dL (ref 70–99)
Potassium: 4.4 mmol/L (ref 3.5–5.1)
Sodium: 137 mmol/L (ref 135–145)

## 2018-08-27 LAB — CBC
HCT: 29.3 % — ABNORMAL LOW (ref 36.0–46.0)
Hemoglobin: 9.7 g/dL — ABNORMAL LOW (ref 12.0–15.0)
MCH: 29.1 pg (ref 26.0–34.0)
MCHC: 33.1 g/dL (ref 30.0–36.0)
MCV: 88 fL (ref 80.0–100.0)
Platelets: 272 10*3/uL (ref 150–400)
RBC: 3.33 MIL/uL — ABNORMAL LOW (ref 3.87–5.11)
RDW: 14.2 % (ref 11.5–15.5)
WBC: 11.3 10*3/uL — ABNORMAL HIGH (ref 4.0–10.5)
nRBC: 0 % (ref 0.0–0.2)

## 2018-08-27 MED ORDER — PREDNISONE 20 MG PO TABS: 40.0000 mg | ORAL_TABLET | Freq: Every day | ORAL | Status: DC

## 2018-08-27 MED ORDER — AMLODIPINE BESYLATE 5 MG PO TABS
5.0000 mg | ORAL_TABLET | Freq: Every day | ORAL | Status: DC
  Administered 2018-08-27 – 2018-08-28 (×2): 5 mg via ORAL
  Filled 2018-08-27 (×2): qty 1

## 2018-08-27 MED ORDER — NEPRO/CARBSTEADY PO LIQD
237.0000 mL | Freq: Two times a day (BID) | ORAL | Status: DC
  Administered 2018-08-27 – 2018-09-02 (×7): 237 mL via ORAL

## 2018-08-27 MED ORDER — NITROGLYCERIN 2 % TD OINT
0.5000 [in_us] | TOPICAL_OINTMENT | Freq: Four times a day (QID) | TRANSDERMAL | Status: DC
  Administered 2018-08-27 – 2018-09-03 (×29): 0.5 [in_us] via TOPICAL
  Filled 2018-08-27 (×29): qty 1

## 2018-08-27 MED ORDER — RENA-VITE PO TABS
1.0000 | ORAL_TABLET | Freq: Every day | ORAL | Status: DC
  Administered 2018-08-27 – 2018-09-03 (×8): 1 via ORAL
  Filled 2018-08-27 (×8): qty 1

## 2018-08-27 MED ORDER — OXYMETAZOLINE HCL 0.05 % NA SOLN
6.0000 | Freq: Two times a day (BID) | NASAL | Status: DC | PRN
  Filled 2018-08-27: qty 15

## 2018-08-27 MED ORDER — PREDNISONE 20 MG PO TABS
40.0000 mg | ORAL_TABLET | Freq: Every day | ORAL | Status: DC
  Administered 2018-08-27 – 2018-08-28 (×2): 40 mg via ORAL
  Filled 2018-08-27 (×2): qty 2

## 2018-08-27 MED ORDER — MUPIROCIN 2 % EX OINT
TOPICAL_OINTMENT | Freq: Three times a day (TID) | CUTANEOUS | Status: DC
  Administered 2018-08-27 – 2018-08-30 (×9): via NASAL
  Filled 2018-08-27: qty 22

## 2018-08-27 MED ORDER — HYDRALAZINE HCL 20 MG/ML IJ SOLN
20.0000 mg | Freq: Once | INTRAMUSCULAR | Status: AC
  Administered 2018-08-27: 20 mg via INTRAVENOUS

## 2018-08-27 NOTE — Progress Notes (Signed)
HD Tx completed, tolerated well.    08/27/18 1800  Vital Signs  Pulse Rate 76  Pulse Rate Source Monitor  Resp (!) 24  BP (!) 174/77  BP Location Right Arm  BP Method Automatic  Patient Position (if appropriate) Lying  Oxygen Therapy  SpO2 96 %  O2 Device Room Air  Pulse Oximetry Type Continuous  During Hemodialysis Assessment  Blood Flow Rate (mL/min) 400 mL/min  Arterial Pressure (mmHg) -180 mmHg  Venous Pressure (mmHg) 180 mmHg  Transmembrane Pressure (mmHg) 40 mmHg  Ultrafiltration Rate (mL/min) 670 mL/min  Dialysate Flow Rate (mL/min) 800 ml/min  Conductivity: Machine  14  HD Safety Checks Performed Yes  KECN 79.9 KECN  Dialysis Fluid Bolus Normal Saline  Bolus Amount (mL) 250 mL  Intra-Hemodialysis Comments Tx completed;Tolerated well

## 2018-08-27 NOTE — Progress Notes (Signed)
Post HD Assessment    08/27/18 1807  Neurological  Level of Consciousness Alert  Orientation Level Oriented X4  Respiratory  Respiratory Pattern Regular  Chest Assessment Chest expansion symmetrical  Bilateral Breath Sounds Clear;Diminished  Cough None  Cardiac  Pulse Regular  Heart Sounds S1, S2  ECG Monitor Yes  Vascular  R Radial Pulse +2  L Radial Pulse +2  Edema Generalized  Generalized Edema +1  Psychosocial  Psychosocial (WDL) WDL

## 2018-08-27 NOTE — Progress Notes (Signed)
[facility], [place] 08/27/18  Subjective:  Patient found to have positive c-ANCA. This was discussed in depth with the patient. Additional serologies have been ordered. We also discussed risks, benefits, and alternatives to renal biopsy and patient has decided to proceed. Patient also due for dialysis today.   Objective:  Vital signs in last 24 hours:  Temp:  [97.6 F (36.4 C)-100 F (37.8 C)] 97.6 F (36.4 C) (02/25 1206) Pulse Rate:  [69-130] 69 (02/25 1206) Resp:  [18-22] 18 (02/25 1206) BP: (157-190)/(83-121) 157/83 (02/25 1206) SpO2:  [94 %-96 %] 95 % (02/25 1206) Weight:  [109.3 kg] 109.3 kg (02/25 0500)  Weight change:  Filed Weights   08/24/18 0845 08/26/18 1045 08/27/18 0500  Weight: 109.7 kg 109.7 kg 109.3 kg    Intake/Output:    Intake/Output Summary (Last 24 hours) at 08/27/2018 1530 Last data filed at 08/27/2018 0900 Gross per 24 hour  Intake 40 ml  Output 0 ml  Net 40 ml     Physical Exam: General:  No acute distress,  HEENT  anicteric, dry oral mucous membranes  Neck  supple  Pulm/lungs  normal breathing effort, clear to auscultation  CVS/Heart  no rub  Abdomen:   Soft, nontender  Extremities:  + Lower extremity edema  Neurologic:  Alert, oriented  Skin:  No acute rashes  Access:  Permcath in place       Basic Metabolic Panel:  Recent Labs  Lab 08/21/18 1926  08/23/18 0641 08/24/18 0404 08/25/18 0511 08/26/18 0433 08/27/18 0324  NA 136   < > 139 138 139 138 137  K 6.2*   < > 4.3 3.9 3.9 4.0 4.4  CL 106   < > 106 102 100 98 99  CO2 12*   < > 18* 24 29 26 25   GLUCOSE 97   < > 110* 98 93 90 97  BUN 137*   < > 92* 66* 42* 60* 75*  CREATININE 13.51*   < > 11.13* 8.58* 6.46* 8.59* 10.20*  CALCIUM 8.1*   < > 7.7* 7.7* 7.8* 8.0* 8.1*  PHOS 11.4*  --   --  7.6*  --   --   --    < > = values in this interval not displayed.     CBC: Recent Labs  Lab 08/21/18 0934 08/24/18 0404 08/27/18 0324  WBC 10.7*  9.8 11.3*  NEUTROABS 7.9*  --   --   HGB 11.8* 9.1* 9.7*  HCT 36.2 27.8* 29.3*  MCV 89.2 87.1 88.0  PLT 328 268 272      Lab Results  Component Value Date   HEPBSAG Negative 08/21/2018   HEPBSAB Non Reactive 08/21/2018      Microbiology:  Recent Results (from the past 240 hour(s))  Urine Culture     Status: None   Collection Time: 08/21/18  9:34 AM  Result Value Ref Range Status   Specimen Description   Final    URINE, RANDOM Performed at [facility], [address] [place], [place] [address]    Special Requests   Final    NONE Performed at [facility], [address] [place], [place] [address]    Culture   Final    NO GROWTH Performed at [facility], [place] [address], [place], [place] [address]    Report Status 08/22/2018 FINAL  Final  MRSA PCR Screening     Status: None   Collection Time: 08/22/18  1:48 AM  Result Value Ref Range Status   MRSA by PCR NEGATIVE NEGATIVE Final    Comment:        The GeneXpert MRSA Assay (FDA approved for NASAL specimens only), is one component of a comprehensive MRSA colonization surveillance program. It is not intended to diagnose MRSA infection nor to guide or monitor treatment for MRSA infections. Performed at [facility], [address] [place], [place] [address]   Gastrointestinal Panel by PCR , Stool     Status: None   Collection Time: 08/23/18 12:56 PM  Result Value Ref Range Status   Campylobacter species NOT DETECTED NOT DETECTED Final   Plesimonas shigelloides NOT DETECTED NOT DETECTED Final   Salmonella species NOT DETECTED NOT DETECTED Final   Yersinia enterocolitica NOT DETECTED NOT DETECTED Final   Vibrio species NOT DETECTED NOT DETECTED Final   Vibrio cholerae NOT DETECTED NOT DETECTED Final   Enteroaggregative E coli (EAEC) NOT DETECTED NOT DETECTED Final   Enteropathogenic E coli (EPEC) NOT DETECTED NOT DETECTED Final   Enterotoxigenic E coli (ETEC) NOT DETECTED  NOT DETECTED Final   Shiga like toxin producing E coli (STEC) NOT DETECTED NOT DETECTED Final   Shigella/Enteroinvasive E coli (EIEC) NOT DETECTED NOT DETECTED Final   Cryptosporidium NOT DETECTED NOT DETECTED Final   Cyclospora cayetanensis NOT DETECTED NOT DETECTED Final   Entamoeba histolytica NOT DETECTED NOT DETECTED Final   Giardia lamblia NOT DETECTED NOT DETECTED Final   Adenovirus F40/41 NOT DETECTED NOT DETECTED Final   Astrovirus NOT DETECTED NOT DETECTED Final   Norovirus GI/GII NOT DETECTED NOT DETECTED Final   Rotavirus A NOT DETECTED NOT DETECTED Final   Sapovirus (I, II, IV, and V) NOT DETECTED NOT DETECTED Final    Comment: Performed at [facility], [address] [place], [place] [address]    Coagulation Studies: No results for input(s): LABPROT, INR in the last 72 hours.  Urinalysis: No results for input(s): COLORURINE, LABSPEC, PHURINE, GLUCOSEU, HGBUR, BILIRUBINUR, KETONESUR, PROTEINUR, UROBILINOGEN, NITRITE, LEUKOCYTESUR in the last 72 hours.  Invalid input(s): APPERANCEUR    Imaging: No results found.   Medications:    . amLODipine  5 mg Oral Daily  . Chlorhexidine Gluconate Cloth  6 each Topical Q0600  . feeding supplement (NEPRO CARB STEADY)  237 mL Oral BID BM  . metoprolol tartrate  100 mg Oral BID  . multivitamin  1 tablet Oral QHS  . mupirocin ointment   Nasal TID  . nitroGLYCERIN  0.5 inch Topical Q6H  . [START ON 08/28/2018] predniSONE  40 mg Oral Q breakfast   acetaminophen **OR** acetaminophen, butalbital-acetaminophen-caffeine, hydrALAZINE, ondansetron **OR** ondansetron (ZOFRAN) IV, oxymetazoline, polyethylene glycol  Assessment/ Plan:  53 y.o. Caucasian female with medical problems of bipolar disorder pretension, depression, anxiety, was admitted on 08/21/2018 with 2 weeks of headache, acute renal failure, severe hyperkalemia.   Acute kidney injury with severe hyperkalemia, oliguric,  -Cause of acute kidney injury is  unclear.  Patient's baseline creatinine is 0.55 on December 14, 2016.  Renal imaging in the form of CT without IV contrast is negative for obstruction or stone. C-ANCA postiive.   -patient found to have c-ANCA antibody positive.  Therefore at this time we recommend renal biopsy.  We will plan for dialysis today to further treat uremia to limit chance of bleeding.  We discussed risks, benefits, and alternatives to renal biopsy and patient is consented to proceed.  We will order this for tomorrow.  Hyperkalemia Improved significantly.  We plan to  use 3K bath with dialysis today.  Proteinuria, hematuria, pyuria noted on urinalysis Urine culture is negative Urine protein to creatinine ratio is 6.4 g.  We will repeat when urine output improves -  Positive ANCA antibodies as above.  Renal biopsy for tomorrow.  Hyperphosphatemia followup serum phosphorus today.   LOS: 6 Miyani Cronic 2/25/20203:30 PM  [place], [place]  Note: This note was prepared with Dragon dictation. Any transcription errors are unintentional

## 2018-08-27 NOTE — Consult Note (Signed)
Jennifer Newman, Jennifer Newman [id number] 1965-12-26 Jennifer Shelling, MD  Reason for Consult: Epistaxis  HPI: 53 year old female with multiple medical problems.  Difficult history to take issues very somnolent.  According to her he has been having intermittent mild nosebleeds off and on since her admission.  She has not had a profuse bleed and according to her her nose has not bled in about 4 days.  She is currently on dialysis.  Allergies: No Known Allergies  ROS: Review of systems normal other than 12 systems except per HPI.  PMH:  Past Medical History:  Diagnosis Date  . Anxiety   . Bipolar disorder ([place])   . Depression   . Hypertension   . Obsessive-compulsive disorder   . PTSD (post-traumatic stress disorder)   . Tachycardia, unspecified   . Vertigo     FH:  Family History  Problem Relation Age of Onset  . COPD Neg Hx   . Diabetes Neg Hx   . Hypertension Neg Hx   . CAD Neg Hx     SH:  Social History   Socioeconomic History  . Marital status: Married    Spouse name: Corene Cornea  . Number of children: 2  . Years of education: Not on file  . Highest education level: Associate degree: occupational, Hotel manager, or vocational program  Occupational History  . 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 Financial resource strain: Not hard at all  . Food insecurity:    Worry: Never true    Inability: Never true  . Transportation needs:    Medical: No    Non-medical: No  Tobacco Use  . Smoking status: Never Smoker  . Smokeless tobacco: Never Used  Substance and Sexual Activity  . Alcohol use: Yes    Alcohol/week: 1.0 standard drinks    Types: 1 Shots of liquor per week    Comment: occasionally  . Drug use: Not Currently    Types: Marijuana    Comment: Last used several weeks back   . Sexual activity: Not Currently  Lifestyle  . Physical activity:    Days per week: 0 days    Minutes per session: 0 min  . Stress: Rather  much  Relationships  . Social connections:    Talks on phone: Never    Gets together: Never    Attends religious service: Never    Active member of club or organization: No    Attends meetings of clubs or organizations: Never    Relationship status: Married  . Intimate partner violence:    Fear of current or ex partner: No    Emotionally abused: No    Physically abused: No    Forced sexual activity: No  Other Topics Concern  . Not on file  Social History Narrative  . Not on file    PSH:  Past Surgical History:  Procedure Laterality Date  . DIALYSIS/PERMA CATHETER INSERTION N/A 08/22/2018   Procedure: DIALYSIS temporary catheter INSERTION;  Surgeon: Algernon Huxley, MD;  Location: [facility];  Service: Cardiovascular;  Laterality: N/A;  . DIALYSIS/PERMA CATHETER INSERTION N/A 08/26/2018   Procedure: DIALYSIS/PERMA CATHETER INSERTION;  Surgeon: Algernon Huxley, MD;  Location: [facility];  Service: Cardiovascular;  Laterality: N/A;  . none      Physical  Exam: CN 2-12 grossly intact and symmetric. External ears appeared normal. Oral cavity, lips, gums, ororpharynx normal with no masses or lesions.  Dry mucous membranes.  Skin warm and dry. Nasal cavity without polyps or purulence.  Nasal mucosa shows severe dry mucous membranes there is some crusting on the right but no active bleeding.  External nose and ears without masses or lesions. EOMI, PERRLA. Neck supple with no masses or lesions. No lymphadenopathy palpated. Thyroid normal with no masses.   A/P: Intermittent mild epistaxis with her being on dialysis and her history of hypertension as well as being in  the hospital with  very dry air not unusual to have this intermittent bleeding.  Does not sound to be profuse bleeding.  Would recommend room air humidifier, and use of topical antibiotic ointment.  If the bleeding worsens would recommend Afrin 6 puffs each nostril ice pack across dorsum of the nose anterior nasal  compression.  I do not think she needs any nasal packing or cautery at this point.   Roena Malady 08/27/2018 10:55 AM

## 2018-08-27 NOTE — Progress Notes (Signed)
Pre HD Assessment    08/27/18 1340  Neurological  Level of Consciousness Alert  Orientation Level Oriented X4  Respiratory  Respiratory Pattern Regular  Chest Assessment Chest expansion symmetrical  Bilateral Breath Sounds Diminished  Cough None  Cardiac  Pulse Regular  Heart Sounds S1, S2  ECG Monitor Yes  Vascular  R Radial Pulse +2  L Radial Pulse +2  Edema Generalized  Generalized Edema +1  Psychosocial  Psychosocial (WDL) WDL

## 2018-08-27 NOTE — Progress Notes (Signed)
Post HD Tx    08/27/18 1802  Vital Signs  Temp 97.8 F (36.6 C)  Temp Source Oral  Pulse Rate 73  Pulse Rate Source Monitor  Resp (!) 23  BP (!) 167/81  BP Location Right Arm  BP Method Automatic  Patient Position (if appropriate) Lying  Oxygen Therapy  SpO2 96 %  O2 Device Room Air  Pulse Oximetry Type Continuous  Pain Assessment  Pain Scale 0-10  Pain Score 3  Pain Type Acute pain  Pain Location Head  Pain Intervention(s) RN made aware  Dialysis Weight  Weight 107.8 kg  Type of Weight Post-Dialysis  Post-Hemodialysis Assessment  Rinseback Volume (mL) 500 mL  KECN 79.9 V  Dialyzer Clearance Lightly streaked  Duration of HD Treatment -hour(s) 4 hour(s)  Hemodialysis Intake (mL) 500 mL  UF Total -Machine (mL) 2010 mL  Net UF (mL) 1510 mL  Tolerated HD Treatment Yes  Hemodialysis Catheter Right Subclavian Double-lumen;Permanent  Placement Date/Time: 08/26/18 1300   Orientation: Right  Access Location: Subclavian  Hemodialysis Catheter Type: Double-lumen;Permanent  Site Condition No complications  Blue Lumen Status Heparin locked  Red Lumen Status Heparin locked  Purple Lumen Status N/A  Catheter fill solution Heparin 1000 units/ml  Catheter fill volume (Arterial) 1.5 cc  Catheter fill volume (Venous) 1.5  Dressing Type Biopatch;Occlusive  Dressing Status Clean;Dry;Intact  Interventions New dressing  Drainage Description None  Dressing Change Due 09/03/18  Post treatment catheter status Capped and Clamped

## 2018-08-27 NOTE — Progress Notes (Signed)
[facility] at [place] NAME: Jennifer Newman    MR#:  [id number]  DATE OF BIRTH:  02/24/1966  SUBJECTIVE:  CHIEF COMPLAINT:   Chief Complaint  Patient presents with  . Headache  . Epistaxis  Patient seen and evaluated today Had some nasal bleed Patient due for dialysis today No abdominal pain No nausea and vomiting Tolerating diet okay  REVIEW OF SYSTEMS:    ROS  CONSTITUTIONAL: No documented fever. No fatigue, weakness. No weight gain, no weight loss.  EYES: No blurry or double vision.  ENT: No tinnitus. No postnasal drip. No redness of the oropharynx.  Had some nasal bleed RESPIRATORY: No cough, no wheeze, no hemoptysis. No dyspnea.  CARDIOVASCULAR: No chest pain. No orthopnea. No palpitations. No syncope.  GASTROINTESTINAL: No nausea, no vomiting or diarrhea. No abdominal pain. No melena or hematochezia.  GENITOURINARY: No dysuria or hematuria.  ENDOCRINE: No polyuria or nocturia. No heat or cold intolerance.  HEMATOLOGY: No anemia. No bruising. No bleeding.  INTEGUMENTARY: No rashes. No lesions.  MUSCULOSKELETAL: No arthritis. No swelling. No gout.  NEUROLOGIC: No numbness, tingling, or ataxia. No seizure-type activity.  PSYCHIATRIC: No anxiety. No insomnia. No ADD.   DRUG ALLERGIES:  No Known Allergies  VITALS:  Blood pressure (!) 159/83, pulse 77, temperature 98.4 F (36.9 C), temperature source Oral, resp. rate (!) 22, height 5\' 3"  (1.6 m), weight 109.3 kg, last menstrual period 04/09/2018, SpO2 96 %.  PHYSICAL EXAMINATION:   Physical Exam  GENERAL:  53 y.o.-year-old patient lying in the bed with no acute distress.  EYES: Pupils equal, round, reactive to light and accommodation. No scleral icterus. Extraocular muscles intact.  HEENT: Head atraumatic, normocephalic. Oropharynx and nasopharynx clear.  NECK:  Supple, no jugular venous distention. No thyroid enlargement, no tenderness.  LUNGS: Normal breath sounds  bilaterally, no wheezing, rales, rhonchi. No use of accessory muscles of respiration.  CARDIOVASCULAR: S1, S2 normal. No murmurs, rubs, or gallops.  ABDOMEN: Soft, nontender, nondistended. Bowel sounds present. No organomegaly or mass.  EXTREMITIES: No cyanosis, clubbing or edema b/l.    NEUROLOGIC: Cranial nerves II through XII are intact. No focal Motor or sensory deficits b/l.   PSYCHIATRIC: The patient is alert and oriented x 3.  SKIN: No obvious rash, lesion, or ulcer.   LABORATORY PANEL:   CBC Recent Labs  Lab 08/27/18 0324  WBC 11.3*  HGB 9.7*  HCT 29.3*  PLT 272   ------------------------------------------------------------------------------------------------------------------ Chemistries  Recent Labs  Lab 08/21/18 0934  08/27/18 0324  NA 135   < > 137  K 6.5*   < > 4.4  CL 105   < > 99  CO2 12*   < > 25  GLUCOSE 96   < > 97  BUN 133*   < > 75*  CREATININE 13.29*   < > 10.20*  CALCIUM 8.8*   < > 8.1*  AST 20  --   --   ALT 44  --   --   ALKPHOS 198*  --   --   BILITOT 0.8  --   --    < > = values in this interval not displayed.   ------------------------------------------------------------------------------------------------------------------  Cardiac Enzymes No results for input(s): TROPONINI in the last 168 hours. ------------------------------------------------------------------------------------------------------------------  RADIOLOGY:  No results found.   ASSESSMENT AND PLAN:   53 year old female patient with history of hypertension, bipolar disorder, OCD, tachycardia, vertigo currently under hospitalist service for acute renal failure.  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 by nephrology Dialysis today Creatinine still critically high Tunneled dialysis catheter placed yesterday Nephrology follow-up appreciated No signs of uremia Start oral  steroids  -Nasal bleed with vasculitis ENT consult Oral steroids  -Obesity Weight loss counseling given  -Diarrhea self-limiting  -Hypertension Continue oral metoprolol for control of blood pressure  -DVT prophylaxis We will discontinue subcu heparin Since sequential compression device to lower extremities  All the records are reviewed and case discussed with Care Management/Social Worker. Management plans discussed with the patient, family and they are in agreement.  CODE STATUS: Full code  DVT Prophylaxis: SCDs  TOTAL TIME TAKING CARE OF THIS PATIENT: 37 minutes.   POSSIBLE D/C IN 2 to 3 DAYS, DEPENDING ON CLINICAL CONDITION.  Jennifer Newman M.D on 08/27/2018 at 10:38 AM  Between 7am to 6pm - Pager - [phone]  After 6pm go to [email or web address] - password [email or web address] [facility]  Office  [phone]  CC: Primary care physician; Jennifer Boys, MD  Note: This dictation was prepared with Dragon dictation along with smaller phrase technology. Any transcriptional errors that result from this process are unintentional.

## 2018-08-28 ENCOUNTER — Inpatient Hospital Stay: Payer: BLUE CROSS/BLUE SHIELD

## 2018-08-28 LAB — PROTIME-INR
INR: 1 (ref 0.8–1.2)
Prothrombin Time: 13.3 seconds (ref 11.4–15.2)

## 2018-08-28 LAB — BASIC METABOLIC PANEL
Anion gap: 12 (ref 5–15)
BUN: 49 mg/dL — ABNORMAL HIGH (ref 6–20)
CO2: 29 mmol/L (ref 22–32)
Calcium: 8.1 mg/dL — ABNORMAL LOW (ref 8.9–10.3)
Chloride: 98 mmol/L (ref 98–111)
Creatinine, Ser: 7.14 mg/dL — ABNORMAL HIGH (ref 0.44–1.00)
GFR calc Af Amer: 7 mL/min — ABNORMAL LOW (ref >60)
GFR calc non Af Amer: 6 mL/min — ABNORMAL LOW (ref >60)
Glucose, Bld: 115 mg/dL — ABNORMAL HIGH (ref 70–99)
Potassium: 4.7 mmol/L (ref 3.5–5.1)
Sodium: 139 mmol/L (ref 135–145)

## 2018-08-28 LAB — HEMOGLOBIN AND HEMATOCRIT, BLOOD
HCT: 30.7 % — ABNORMAL LOW (ref 36.0–46.0)
HEMOGLOBIN: 9.8 g/dL — AB (ref 12.0–15.0)

## 2018-08-28 LAB — MPO/PR-3 (ANCA) ANTIBODIES
ANCA Proteinase 3: <3.5 U/mL (ref 0.0–3.5)
Myeloperoxidase Abs: <9 U/mL (ref 0.0–9.0)

## 2018-08-28 MED ORDER — FENTANYL CITRATE (PF) 100 MCG/2ML IJ SOLN
INTRAMUSCULAR | Status: AC
  Filled 2018-08-28: qty 2

## 2018-08-28 MED ORDER — HYDRALAZINE HCL 20 MG/ML IJ SOLN
INTRAMUSCULAR | Status: AC
  Administered 2018-08-28: 10 mg via INTRAVENOUS
  Filled 2018-08-28: qty 1

## 2018-08-28 MED ORDER — MIDAZOLAM HCL 2 MG/2ML IJ SOLN
INTRAMUSCULAR | Status: AC
  Filled 2018-08-28: qty 4

## 2018-08-28 MED ORDER — FENTANYL CITRATE (PF) 100 MCG/2ML IJ SOLN
INTRAMUSCULAR | Status: AC | PRN
  Administered 2018-08-28: 25 ug via INTRAVENOUS

## 2018-08-28 MED ORDER — SODIUM CHLORIDE 0.9 % IV SOLN: INTRAVENOUS | Status: DC

## 2018-08-28 NOTE — Progress Notes (Signed)
[place], [place] 08/28/18  Subjective:  Patient due for renal biopsy today. Resting comfortably in bed at the moment. Discussed biopsy procedure with the patient today.   Objective:  Vital signs in last 24 hours:  Temp:  [97.6 F (36.4 C)-98.6 F (37 C)] 97.8 F (36.6 C) (02/26 0356) Pulse Rate:  [58-126] 75 (02/26 1142) Resp:  [16-26] 17 (02/26 1142) BP: (123-190)/(71-94) 148/81 (02/26 1130) SpO2:  [89 %-98 %] 96 % (02/26 1142) Weight:  [106.4 kg-109.8 kg] 106.4 kg (02/26 0356)  Weight change: -1.9 kg Filed Weights   08/27/18 1402 08/27/18 1802 08/28/18 0356  Weight: (P) 109.8 kg 107.8 kg 106.4 kg    Intake/Output:    Intake/Output Summary (Last 24 hours) at 08/28/2018 1146 Last data filed at 08/28/2018 0500 Gross per 24 hour  Intake 240 ml  Output 1510 ml  Net -1270 ml     Physical Exam: General:  No acute distress  HEENT  anicteric, moist oral mucous membranes  Neck  supple  Pulm/lungs  normal breathing effort, clear to auscultation  CVS/Heart  no rub  Abdomen:   Soft, nontender  Extremities:  + Lower extremity edema  Neurologic:  Alert, oriented x 3, follows commands  Skin:  No acute rashes  Access:  Permcath in place, right femoral dialysis catheter       Basic Metabolic Panel:  Recent Labs  Lab 08/21/18 1926  08/24/18 0404 08/25/18 0511 08/26/18 0433 08/27/18 0324 08/28/18 0627  NA 136   < > 138 139 138 137 139  K 6.2*   < > 3.9 3.9 4.0 4.4 4.7  CL 106   < > 102 100 98 99 98  CO2 12*   < > 24 29 26 25 29   GLUCOSE 97   < > 98 93 90 97 115*  BUN 137*   < > 66* 42* 60* 75* 49*  CREATININE 13.51*   < > 8.58* 6.46* 8.59* 10.20* 7.14*  CALCIUM 8.1*   < > 7.7* 7.8* 8.0* 8.1* 8.1*  PHOS 11.4*  --  7.6*  --   --   --   --    < > = values in this interval not displayed.     CBC: Recent Labs  Lab 08/24/18 0404 08/27/18 0324  WBC 9.8 11.3*  HGB 9.1* 9.7*  HCT 27.8* 29.3*  MCV 87.1 88.0  PLT 268 272       Lab Results  Component Value Date   HEPBSAG Negative 08/21/2018   HEPBSAB Non Reactive 08/21/2018      Microbiology:  Recent Results (from the past 240 hour(s))  Urine Culture     Status: None   Collection Time: 08/21/18  9:34 AM  Result Value Ref Range Status   Specimen Description   Final    URINE, RANDOM Performed at [facility], [address] [place], [place] [address]    Special Requests   Final    NONE Performed at [facility], [address], [place], [place] [address]    Culture   Final    NO GROWTH Performed at [facility], [place] [address], [place], [place] [address]    Report Status 08/22/2018 FINAL  Final  MRSA PCR Screening     Status: None   Collection Time: 08/22/18  1:48 AM  Result Value Ref Range Status   MRSA by PCR NEGATIVE NEGATIVE Final    Comment:        The  GeneXpert MRSA Assay (FDA approved for NASAL specimens only), is one component of a comprehensive MRSA colonization surveillance program. It is not intended to diagnose MRSA infection nor to guide or monitor treatment for MRSA infections. Performed at [facility], [address] [place], [place] [address]   Gastrointestinal Panel by PCR , Stool     Status: None   Collection Time: 08/23/18 12:56 PM  Result Value Ref Range Status   Campylobacter species NOT DETECTED NOT DETECTED Final   Plesimonas shigelloides NOT DETECTED NOT DETECTED Final   Salmonella species NOT DETECTED NOT DETECTED Final   Yersinia enterocolitica NOT DETECTED NOT DETECTED Final   Vibrio species NOT DETECTED NOT DETECTED Final   Vibrio cholerae NOT DETECTED NOT DETECTED Final   Enteroaggregative E coli (EAEC) NOT DETECTED NOT DETECTED Final   Enteropathogenic E coli (EPEC) NOT DETECTED NOT DETECTED Final   Enterotoxigenic E coli (ETEC) NOT DETECTED NOT DETECTED Final   Shiga like toxin producing E coli (STEC) NOT DETECTED NOT DETECTED Final   Shigella/Enteroinvasive E coli  (EIEC) NOT DETECTED NOT DETECTED Final   Cryptosporidium NOT DETECTED NOT DETECTED Final   Cyclospora cayetanensis NOT DETECTED NOT DETECTED Final   Entamoeba histolytica NOT DETECTED NOT DETECTED Final   Giardia lamblia NOT DETECTED NOT DETECTED Final   Adenovirus F40/41 NOT DETECTED NOT DETECTED Final   Astrovirus NOT DETECTED NOT DETECTED Final   Norovirus GI/GII NOT DETECTED NOT DETECTED Final   Rotavirus A NOT DETECTED NOT DETECTED Final   Sapovirus (I, II, IV, and V) NOT DETECTED NOT DETECTED Final    Comment: Performed at [facility], [address] [place], [place] [address]    Coagulation Studies: Recent Labs    08/28/18 0835  LABPROT 13.3  INR 1.0    Urinalysis: No results for input(s): COLORURINE, LABSPEC, PHURINE, GLUCOSEU, HGBUR, BILIRUBINUR, KETONESUR, PROTEINUR, UROBILINOGEN, NITRITE, LEUKOCYTESUR in the last 72 hours.  Invalid input(s): APPERANCEUR    Imaging: No results found.   Medications:   . sodium chloride     . amLODipine  5 mg Oral Daily  . Chlorhexidine Gluconate Cloth  6 each Topical Q0600  . feeding supplement (NEPRO CARB STEADY)  237 mL Oral BID BM  . fentaNYL      . metoprolol tartrate  100 mg Oral BID  . midazolam      . multivitamin  1 tablet Oral QHS  . mupirocin ointment   Nasal TID  . nitroGLYCERIN  0.5 inch Topical Q6H  . predniSONE  40 mg Oral Q breakfast   acetaminophen **OR** acetaminophen, butalbital-acetaminophen-caffeine, fentaNYL, hydrALAZINE, ondansetron **OR** ondansetron (ZOFRAN) IV, oxymetazoline, polyethylene glycol  Assessment/ Plan:  53 y.o. Caucasian female with medical problems of bipolar disorder pretension, depression, anxiety, was admitted on 08/21/2018 with 2 weeks of headache, acute renal failure, severe hyperkalemia.   Acute kidney injury with severe hyperkalemia, oliguric,  -Cause of acute kidney injury is unclear.  Patient's baseline creatinine is 0.55 on December 14, 2016.  Renal imaging in the  form of CT without IV contrast is negative for obstruction or stone. C-ANCA postiive.   -Renal biopsy to be performed today.  We will go ahead and start the patient on Solu-Medrol 1 g daily x3 days.  We will await biopsy results before deciding upon additional immunosuppression.  We are considering rituximab.  Hyperkalemia Continue to periodically monitor serum potassium.  Proteinuria, hematuria, pyuria noted on urinalysis Urine culture is negative Urine protein to creatinine ratio is 6.4 g.  We will repeat when urine output improves -Renal biopsy being performed today as above.  Hyperphosphatemia Follow-up serum phosphorus tomorrow..   LOS: 7 Malajah Oceguera 2/26/202011:46 AM  [place], [place]  Note: This note was prepared with Dragon dictation. Any transcription errors are unintentional

## 2018-08-28 NOTE — Consult Note (Signed)
Chief Complaint: Patient was seen in consultation today for  Chief Complaint  Patient presents with  . Headache  . Epistaxis   at the request of Dr. Holley Raring   Referring Physician(s): Holley Raring, [place]   Patient Status: [place] - In-pt  History of Present Illness: Jennifer Newman is a 53 y.o. female with acute kidney injury and severe hyperkalemia.  Patient was found to have c-ANCA antibody positive.  Patient currently has a tunneled right jugular dialysis catheter and a right groin temporary dialysis catheter.  Patient is feeling better now that she has been in the hospital.  She currently has no complaints.  Lower extremity swelling has decreased.  Past Medical History:  Diagnosis Date  . Anxiety   . Bipolar disorder ([place])   . Depression   . Hypertension   . Obsessive-compulsive disorder   . PTSD (post-traumatic stress disorder)   . Tachycardia, unspecified   . Vertigo     Past Surgical History:  Procedure Laterality Date  . DIALYSIS/PERMA CATHETER INSERTION N/A 08/22/2018   Procedure: DIALYSIS temporary catheter INSERTION;  Surgeon: Algernon Huxley, MD;  Location: [facility];  Service: Cardiovascular;  Laterality: N/A;  . DIALYSIS/PERMA CATHETER INSERTION N/A 08/26/2018   Procedure: DIALYSIS/PERMA CATHETER INSERTION;  Surgeon: Algernon Huxley, MD;  Location: [facility];  Service: Cardiovascular;  Laterality: N/A;  . none      Allergies: Patient has no known allergies.  Medications: Prior to Admission medications   Medication Sig Start Date End Date Taking? Authorizing Provider  fluticasone (FLONASE) 50 MCG/ACT nasal spray Place 1 spray into both nostrils 2 (two) times daily. 06/09/18  Yes [provider]  metoprolol succinate (TOPROL-XL) 100 MG 24 hr tablet  10/01/17  Yes [provider]     Family History  Problem Relation Age of Onset  . COPD Neg Hx   . Diabetes Neg Hx   . Hypertension Neg Hx   . CAD Neg Hx     Social History     Socioeconomic History  . Marital status: Married    Spouse name: Corene Cornea  . Number of children: 2  . Years of education: Not on file  . Highest education level: Associate degree: occupational, Hotel manager, or vocational program  Occupational History  . 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 Financial resource strain: Not hard at all  . Food insecurity:    Worry: Never true    Inability: Never true  . Transportation needs:    Medical: No    Non-medical: No  Tobacco Use  . Smoking status: Never Smoker  . Smokeless tobacco: Never Used  Substance and Sexual Activity  . Alcohol use: Yes    Alcohol/week: 1.0 standard drinks    Types: 1 Shots of liquor per week    Comment: occasionally  . Drug use: Not Currently    Types: Marijuana    Comment: Last used several weeks back   . Sexual activity: Not Currently  Lifestyle  . Physical activity:    Days per week: 0 days    Minutes per session: 0 min  . Stress: Rather much  Relationships  . Social connections:    Talks on phone: Never    Gets together: Never    Attends religious service: Never    Active member of club or organization: No    Attends meetings of clubs or organizations: Never    Relationship status:  Married  Other Topics Concern  . Not on file  Social History Narrative  . Not on file      Review of Systems: A 12 point ROS discussed and pertinent positives are indicated in the HPI above.  All other systems are negative.  Review of Systems  Respiratory: Negative.   Cardiovascular: Negative.   Gastrointestinal: Negative.     Vital Signs: BP (!) 178/94 Comment: left upper arm with large cuff  Pulse 61   Temp 97.8 F (36.6 C) (Oral)   Resp 20   Ht 5' 3"  (1.6 m)   Wt 106.4 kg   LMP 04/09/2018 (Approximate)   SpO2 95%   BMI 41.55 kg/m   Physical Exam Vitals signs reviewed.  HENT:     Mouth/Throat:     Mouth: Mucous membranes are moist.      Pharynx: Oropharynx is clear.  Cardiovascular:     Rate and Rhythm: Normal rate and regular rhythm.  Pulmonary:     Effort: Pulmonary effort is normal.     Comments: Faint bilateral breath sounds. Abdominal:     Palpations: Abdomen is soft.     Imaging: Ct Head Wo Contrast  Result Date: 08/21/2018 CLINICAL DATA:  53 year old female with bipolar disorder and headaches EXAM: CT HEAD WITHOUT CONTRAST TECHNIQUE: Contiguous axial images were obtained from the base of the skull through the vertex without intravenous contrast. COMPARISON:  12/13/2016 FINDINGS: Brain: No acute intracranial hemorrhage. No midline shift or mass effect. Gray-white differentiation maintained. Unremarkable appearance of the ventricular system. Vascular: Unremarkable. Skull: No acute fracture.  No aggressive bone lesion identified. Sinuses/Orbits: Unremarkable appearance of the orbits. Mastoid air cells clear. No middle ear effusion. No significant sinus disease. Other: None IMPRESSION: Negative head CT Electronically Signed   By: Corrie Mckusick D.O.   On: 08/21/2018 12:40   Dg Chest Portable 1 View  Result Date: 08/21/2018 CLINICAL DATA:  Headache. EXAM: PORTABLE CHEST 1 VIEW COMPARISON:  12/13/2016 FINDINGS: The heart is enlarged but appears stable. Slightly prominent mediastinal and hilar contours are unchanged. No acute pulmonary findings. No infiltrates or effusions. The bony thorax is intact. IMPRESSION: Stable cardiac enlargement.  No acute pulmonary findings. Electronically Signed   By: Marijo Sanes M.D.   On: 08/21/2018 12:18   Dg Bone Survey Met  Result Date: 08/24/2018 CLINICAL DATA:  MGUS EXAM: METASTATIC BONE SURVEY COMPARISON:  None. FINDINGS: Cardiomegaly with vascular congestion.  No overt edema or effusions. No acute bony abnormality or focal lytic lesion within the visualized skeleton. Probable old healed fracture in the distal left fibula. Dialysis catheter noted in the right groin with the tip in the  lower IVC. IMPRESSION: No acute bony abnormality or focal lytic lesion. Cardiomegaly, vascular congestion. Electronically Signed   By: Rolm Baptise M.D.   On: 08/24/2018 15:46   Ct Renal Stone Study  Result Date: 08/21/2018 CLINICAL DATA:  Jennifer Newman is a 53 y.o. female with a history of hypertension bipolar disorder and anxiety who complains of generalized weakness headaches and decreased urine output for the past 4 to 5 days. EXAM: CT ABDOMEN AND PELVIS WITHOUT CONTRAST TECHNIQUE: Multidetector CT imaging of the abdomen and pelvis was performed following the standard protocol without IV contrast. COMPARISON:  CT of the chest on 12/12/2016 FINDINGS: Lower chest: There is focal subsegmental atelectasis at the lung bases. The heart size is normal. Hepatobiliary: There is diffuse low-attenuation of the liver consistent with hepatic steatosis. Gallbladder is present and normal in CT appearance.  Pancreas: Unremarkable. No pancreatic ductal dilatation or surrounding inflammatory changes. Spleen: No splenic injury or perisplenic hematoma. Adrenals/Urinary Tract: Adrenal glands are unremarkable. Kidneys are normal, without renal calculi, focal lesion, or hydronephrosis. Bladder is unremarkable. Stomach/Bowel: The stomach is unremarkable. Small bowel loops are normal in caliber. No bowel wall thickening or dilatation. The appendix is well seen and has a normal appearance. Loops of colon are normal in appearance. Vascular/Lymphatic: There is minimal atherosclerotic calcification of the abdominal aorta. No associated aneurysm. Reproductive: Uterus is enlarged. No adnexal mass. Other: No free pelvic fluid. Anterior abdominal wall is unremarkable. Musculoskeletal: No acute or significant osseous findings. IMPRESSION: 1. No acute abnormality of the abdomen or pelvis. 2. Hepatic steatosis. 3. Normal appendix. Aortic Atherosclerosis (ICD10-I70.0). Electronically Signed   By: Nolon Nations M.D.   On: 08/21/2018 12:45      Labs:  CBC: Recent Labs    08/21/18 0934 08/24/18 0404 08/27/18 0324  WBC 10.7* 9.8 11.3*  HGB 11.8* 9.1* 9.7*  HCT 36.2 27.8* 29.3*  PLT 328 268 272    COAGS: Recent Labs    08/28/18 0835  INR 1.0    BMP: Recent Labs    08/25/18 0511 08/26/18 0433 08/27/18 0324 08/28/18 0627  NA 139 138 137 139  K 3.9 4.0 4.4 4.7  CL 100 98 99 98  CO2 29 26 25 29   GLUCOSE 93 90 97 115*  BUN 42* 60* 75* 49*  CALCIUM 7.8* 8.0* 8.1* 8.1*  CREATININE 6.46* 8.59* 10.20* 7.14*  GFRNONAA 7* 5* 4* 6*  GFRAA 8* 6* 5* 7*    LIVER FUNCTION TESTS: Recent Labs    08/21/18 0934 08/24/18 0404  BILITOT 0.8  --   AST 20  --   ALT 44  --   ALKPHOS 198*  --   PROT 8.3*  --   ALBUMIN 3.4* 2.7*    TUMOR MARKERS: No results for input(s): AFPTM, CEA, CA199, CHROMGRNA in the last 8760 hours.  Assessment and Plan:  68 year old with acute kidney injury and request for a renal biopsy.  Patient is currently hypertensive and has scheduled and PRN antihypertensive medications.  Plan to give IV hydralazine prior to the procedure in order to get the blood pressure within acceptable range for a renal biopsy.  Anticipate using moderate sedation for the procedure.  Risks and benefits of renal biopsy was discussed with the patient and/or patient's family including, but not limited to bleeding, infection, damage to adjacent structures or low yield requiring additional tests.All of the questions were answered and there is agreement to proceed.Consent signed and in chart.   Thank you for this interesting consult.  I greatly enjoyed meeting ANNABEL GIBEAU and look forward to participating in their care.  A copy of this report was sent to the requesting provider on this date.  Electronically Signed: Burman Riis, MD 08/28/2018, 10:36 AM   I spent a total of 20 Minutes  in face to face in clinical consultation, greater than 50% of which was counseling/coordinating care for a renal biopsy.

## 2018-08-28 NOTE — Sedation Documentation (Signed)
Pt returned to inpt rm 230 post renal biopsy. Pt received only 25 mcg IV Fentanyl, hydrazaline 10 mg and 20 ml NS.

## 2018-08-28 NOTE — Procedures (Signed)
Interventional Radiology Procedure:   Indications: Acute kidney injury   Procedure: US guided random renal biopsy   Findings: Echogenic kidneys without hydronephrosis.  2 cores from left kidney lower pole.  Complications: None     EBL: Minimal  Plan: Bedrest today.     Jennifer Wotton R. Anselm Pancoast, MD  Pager: [phone]

## 2018-08-28 NOTE — Progress Notes (Signed)
[place] at [place] NAME: Kohana Amble    MR#:  [id number]  DATE OF BIRTH:  August 31, 1965  SUBJECTIVE:  CHIEF COMPLAINT:   Chief Complaint  Patient presents with  . Headache  . Epistaxis  Patient seen and evaluated today Has no nasal bleed No abdominal pain No nausea and vomiting Tolerating diet okay Due for kidney biopsy today  REVIEW OF SYSTEMS:    ROS  CONSTITUTIONAL: No documented fever. No fatigue, weakness. No weight gain, no weight loss.  EYES: No blurry or double vision.  ENT: No tinnitus. No postnasal drip. No redness of the oropharynx.  Had some nasal bleed RESPIRATORY: No cough, no wheeze, no hemoptysis. No dyspnea.  CARDIOVASCULAR: No chest pain. No orthopnea. No palpitations. No syncope.  GASTROINTESTINAL: No nausea, no vomiting or diarrhea. No abdominal pain. No melena or hematochezia.  GENITOURINARY: No dysuria or hematuria.  ENDOCRINE: No polyuria or nocturia. No heat or cold intolerance.  HEMATOLOGY: No anemia. No bruising. No bleeding.  INTEGUMENTARY: No rashes. No lesions.  MUSCULOSKELETAL: No arthritis. No swelling. No gout.  NEUROLOGIC: No numbness, tingling, or ataxia. No seizure-type activity.  PSYCHIATRIC: No anxiety. No insomnia. No ADD.   DRUG ALLERGIES:  No Known Allergies  VITALS:  Blood pressure (!) 143/78, pulse 74, temperature 98.5 F (36.9 C), temperature source Oral, resp. rate 14, height 5\' 3"  (1.6 m), weight 106.4 kg, last menstrual period 04/09/2018, SpO2 94 %.  PHYSICAL EXAMINATION:   Physical Exam  GENERAL:  53 y.o.-year-old patient lying in the bed with no acute distress.  EYES: Pupils equal, round, reactive to light and accommodation. No scleral icterus. Extraocular muscles intact.  HEENT: Head atraumatic, normocephalic. Oropharynx and nasopharynx clear.  NECK:  Supple, no jugular venous distention. No thyroid enlargement, no tenderness.  LUNGS: Normal breath sounds bilaterally, no  wheezing, rales, rhonchi. No use of accessory muscles of respiration.  CARDIOVASCULAR: S1, S2 normal. No murmurs, rubs, or gallops.  ABDOMEN: Soft, nontender, nondistended. Bowel sounds present. No organomegaly or mass.  EXTREMITIES: No cyanosis, clubbing or edema b/l.    NEUROLOGIC: Cranial nerves II through XII are intact. No focal Motor or sensory deficits b/l.   PSYCHIATRIC: The patient is alert and oriented x 3.  SKIN: No obvious rash, lesion, or ulcer.   LABORATORY PANEL:   CBC Recent Labs  Lab 08/27/18 0324  WBC 11.3*  HGB 9.7*  HCT 29.3*  PLT 272   ------------------------------------------------------------------------------------------------------------------ Chemistries  Recent Labs  Lab 08/28/18 0627  NA 139  K 4.7  CL 98  CO2 29  GLUCOSE 115*  BUN 49*  CREATININE 7.14*  CALCIUM 8.1*   ------------------------------------------------------------------------------------------------------------------  Cardiac Enzymes No results for input(s): TROPONINI in the last 168 hours. ------------------------------------------------------------------------------------------------------------------  RADIOLOGY:  No results found.   ASSESSMENT AND PLAN:   53 year old female patient with history of hypertension, bipolar disorder, OCD, tachycardia, vertigo currently under hospitalist service for acute renal failure.  -Acute kidney injury with severe oliguria On dialysis Vasculitis is probably the cause of her renal failure Renal biopsy done today by interventional radiology Dialysis as per nephrology Creatinine coming down Tunneled dialysis catheter placed yesterday Nephrology follow-up appreciated No signs of uremia Start oral steroids  -Nasal bleed with vasculitis ENT consult done Oral steroids No packing recommended  -Obesity Weight loss counseling given  -Diarrhea self-limiting  -Hypertension Continue oral metoprolol for control of blood  pressure  -DVT prophylaxis We will discontinue subcu heparin Since sequential compression device to lower extremities  All the records are reviewed and case discussed with Care Management/Social Worker. Management plans discussed with the patient, family and they are in agreement.  CODE STATUS: Full code  DVT Prophylaxis: SCDs  TOTAL TIME TAKING CARE OF THIS PATIENT: 35 minutes.   POSSIBLE D/C IN 2 to 3 DAYS, DEPENDING ON CLINICAL CONDITION.  Saundra Shelling M.D on 08/28/2018 at 2:30 PM  Between 7am to 6pm - Pager - [phone]  After 6pm go to [email or web address] - password [email or web address] [facility]  Office  [phone]  CC: Primary care physician; Barbaraann Boys, MD  Note: This dictation was prepared with Dragon dictation along with smaller phrase technology. Any transcriptional errors that result from this process are unintentional.

## 2018-08-29 LAB — PHOSPHORUS: Phosphorus: 5.7 mg/dL — ABNORMAL HIGH (ref 2.5–4.6)

## 2018-08-29 MED ORDER — SODIUM CHLORIDE 0.9 % IV SOLN: 7.5000 mg/kg | Freq: Once | INTRAVENOUS | Status: DC

## 2018-08-29 MED ORDER — SODIUM CHLORIDE 0.9 % IV SOLN
7.5000 mg/kg | Freq: Once | INTRAVENOUS | Status: AC
  Administered 2018-08-30: 820 mg via INTRAVENOUS
  Filled 2018-08-29 (×2): qty 41

## 2018-08-29 MED ORDER — SODIUM CHLORIDE 0.9 % IV SOLN
INTRAVENOUS | Status: DC | PRN
  Administered 2018-08-29: 15 mL via INTRAVENOUS
  Administered 2018-08-30: 250 mL via INTRAVENOUS

## 2018-08-29 MED ORDER — HEPARIN SODIUM (PORCINE) 5000 UNIT/ML IJ SOLN
5000.0000 [IU] | Freq: Three times a day (TID) | INTRAMUSCULAR | Status: DC
  Administered 2018-08-29 – 2018-09-03 (×16): 5000 [IU] via SUBCUTANEOUS
  Filled 2018-08-29 (×17): qty 1

## 2018-08-29 MED ORDER — SODIUM CHLORIDE 0.9 % IV SOLN
375.0000 mg/m2 | Freq: Once | INTRAVENOUS | Status: AC
  Administered 2018-09-02: 14:00:00 800 mg via INTRAVENOUS
  Filled 2018-08-29 (×2): qty 80

## 2018-08-29 MED ORDER — SULFAMETHOXAZOLE-TRIMETHOPRIM 200-40 MG/5ML PO SUSP
20.0000 mL | Freq: Every day | ORAL | Status: DC
  Administered 2018-08-29 – 2018-09-02 (×5): 20 mL via ORAL
  Filled 2018-08-29 (×7): qty 20

## 2018-08-29 MED ORDER — AMLODIPINE BESYLATE 10 MG PO TABS
10.0000 mg | ORAL_TABLET | Freq: Every day | ORAL | Status: DC
  Administered 2018-08-30 – 2018-09-03 (×5): 10 mg via ORAL
  Filled 2018-08-29 (×6): qty 1

## 2018-08-29 MED ORDER — SODIUM CHLORIDE 0.9 % IV SOLN
1000.0000 mg | INTRAVENOUS | Status: DC
  Administered 2018-08-29 – 2018-09-02 (×5): 1000 mg via INTRAVENOUS
  Filled 2018-08-29 (×6): qty 8

## 2018-08-29 NOTE — Progress Notes (Signed)
HD tx start    08/29/18 1015  Vital Signs  Pulse Rate 69  Pulse Rate Source Monitor  Resp (!) 22  BP (!) 168/90  BP Location Left Arm  BP Method Automatic  Patient Position (if appropriate) Lying  Oxygen Therapy  SpO2 94 %  O2 Device Room Air  During Hemodialysis Assessment  Blood Flow Rate (mL/min) 400 mL/min  Arterial Pressure (mmHg) -170 mmHg  Venous Pressure (mmHg) 140 mmHg  Transmembrane Pressure (mmHg) 70 mmHg  Ultrafiltration Rate (mL/min) 570 mL/min  Dialysate Flow Rate (mL/min) 800 ml/min  Conductivity: Machine  13.8  HD Safety Checks Performed Yes  Dialysis Fluid Bolus Normal Saline  Bolus Amount (mL) 250 mL  Intra-Hemodialysis Comments Tx initiated  Hemodialysis Catheter Right Subclavian Double-lumen;Permanent  Placement Date/Time: 08/26/18 1300   Orientation: Right  Access Location: Subclavian  Hemodialysis Catheter Type: Double-lumen;Permanent  Blue Lumen Status Infusing  Red Lumen Status Infusing

## 2018-08-29 NOTE — Progress Notes (Signed)
[name] NAME: Jennifer Newman    MR#:  [id number]  DATE OF BIRTH:  08-17-1965  SUBJECTIVE:  CHIEF COMPLAINT:   Chief Complaint  Patient presents with  . Headache  . Epistaxis  Patient seen and evaluated today Has no nasal bleed Temporary dialysis catheter removed yesterday Minimal bleed Hemoglobin has been stable No abdominal pain Patient successfully had kidney biopsy yesterday  REVIEW OF SYSTEMS:    ROS  CONSTITUTIONAL: No documented fever. No fatigue, weakness. No weight gain, no weight loss.  EYES: No blurry or double vision.  ENT: No tinnitus. No postnasal drip. No redness of the oropharynx.  Had some nasal bleed RESPIRATORY: No cough, no wheeze, no hemoptysis. No dyspnea.  CARDIOVASCULAR: No chest pain. No orthopnea. No palpitations. No syncope.  GASTROINTESTINAL: No nausea, no vomiting or diarrhea. No abdominal pain. No melena or hematochezia.  GENITOURINARY: No dysuria or hematuria.  ENDOCRINE: No polyuria or nocturia. No heat or cold intolerance.  HEMATOLOGY: No anemia. No bruising. No bleeding.  INTEGUMENTARY: No rashes. No lesions.  MUSCULOSKELETAL: No arthritis. No swelling. No gout.  NEUROLOGIC: No numbness, tingling, or ataxia. No seizure-type activity.  PSYCHIATRIC: No anxiety. No insomnia. No ADD.   DRUG ALLERGIES:  No Known Allergies  VITALS:  Blood pressure (!) 166/87, pulse 64, temperature 98.4 F (36.9 C), temperature source Oral, resp. rate (!) 21, height 5\' 3"  (1.6 m), weight 108.8 kg, last menstrual period 04/09/2018, SpO2 96 %.  PHYSICAL EXAMINATION:   Physical Exam  GENERAL:  53 y.o.-year-old patient lying in the bed with no acute distress.  EYES: Pupils equal, round, reactive to light and accommodation. No scleral icterus. Extraocular muscles intact.  HEENT: Head atraumatic, normocephalic. Oropharynx and nasopharynx clear.  NECK:  Supple, no jugular venous distention. No thyroid  enlargement, no tenderness.  LUNGS: Normal breath sounds bilaterally, no wheezing, rales, rhonchi. No use of accessory muscles of respiration.  CARDIOVASCULAR: S1, S2 normal. No murmurs, rubs, or gallops.  ABDOMEN: Soft, nontender, nondistended. Bowel sounds present. No organomegaly or mass.  EXTREMITIES: No cyanosis, clubbing or edema b/l.    NEUROLOGIC: Cranial nerves II through XII are intact. No focal Motor or sensory deficits b/l.   PSYCHIATRIC: The patient is alert and oriented x 3.  SKIN: No obvious rash, lesion, or ulcer.   LABORATORY PANEL:   CBC Recent Labs  Lab 08/27/18 0324 08/28/18 1745  WBC 11.3*  --   HGB 9.7* 9.8*  HCT 29.3* 30.7*  PLT 272  --    ------------------------------------------------------------------------------------------------------------------ Chemistries  Recent Labs  Lab 08/28/18 0627  NA 139  K 4.7  CL 98  CO2 29  GLUCOSE 115*  BUN 49*  CREATININE 7.14*  CALCIUM 8.1*   ------------------------------------------------------------------------------------------------------------------  Cardiac Enzymes No results for input(s): TROPONINI in the last 168 hours. ------------------------------------------------------------------------------------------------------------------  RADIOLOGY:  US Biopsy (kidney)  Result Date: 08/28/2018 INDICATION: 68 year old with acute kidney injury.  Request for a renal biopsy. EXAM: ULTRASOUND-GUIDED LEFT RENAL BIOPSY MEDICATIONS: None. ANESTHESIA/SEDATION: Fentanyl 25 mcg The patient's level of consciousness and vital signs were monitored continuously by radiology nursing throughout the procedure under my direct supervision. FLUOROSCOPY TIME:  None COMPLICATIONS: None immediate. PROCEDURE: Informed written consent was obtained from the patient after a thorough discussion of the procedural risks, benefits and alternatives. All questions were addressed. A timeout was performed prior to the initiation of the  procedure. Patient was placed prone. Both kidneys were evaluated with ultrasound. The left kidney lower pole  was targeted for biopsy. The left flank was prepped with chlorhexidine and sterile field was created. Skin and soft tissues were anesthetized with 1% lidocaine. Using ultrasound guidance, 16 gauge core biopsy was obtained from the left kidney lower pole. Adequate specimens obtained and placed on a Telfa pad with saline. A second 16 gauge core biopsy was obtained from the left kidney lower pole. Again, an adequate core biopsy was obtained. Bandage placed over the puncture site. FINDINGS: Both kidneys are echogenic without hydronephrosis. Core biopsies obtained from the left kidney lower pole. Adequate core biopsies were obtained. Negative for hematoma or bleeding after the procedure. IMPRESSION: Successful ultrasound-guided core biopsies of the left kidney lower pole. Electronically Signed   By: Markus Daft M.D.   On: 08/28/2018 16:04     ASSESSMENT AND PLAN:   53 year old female patient with history of hypertension, bipolar disorder, OCD, tachycardia, vertigo currently under hospitalist service for acute renal failure.  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 Management plans discussed with the patient, family and they are in  agreement.  CODE STATUS: Full code  DVT Prophylaxis: SCDs  TOTAL TIME TAKING CARE OF THIS PATIENT: 34 minutes.   POSSIBLE D/C IN 2 to 3 DAYS, DEPENDING ON CLINICAL CONDITION.  Saundra Shelling M.D on 08/29/2018 at 10:48 AM  Between 7am to 6pm - Pager - [phone]  After 6pm go to [email or web address] - password [email or web address] [facility]  Office  [phone]  CC: Primary care physician; Barbaraann Boys, MD  Note: This dictation was prepared with Dragon dictation along with smaller phrase technology. Any transcriptional errors that result from this process are unintentional.

## 2018-08-29 NOTE — Progress Notes (Signed)
[facility] a[facility] Progress Note   Subjective: 3 Days Post-Op:  1. Ultrasound guidance for vascular access to the right internal jugular vein 2. Fluoroscopic guidance for placement of catheter 3. Placement of a 19 cm tip to cuff tunneled hemodialysis catheter via the right internal jugular vein  Permcath functioning appropriately.  Patient to follow up with our office if permanent dialysis access is needed. Vascular Surgery will sign off at this time. Please re-consult if needed.  Discussed with Dr. Ellis Parents Maquita Sandoval PA-C 08/29/2018 10:45 AM

## 2018-08-29 NOTE — Progress Notes (Signed)
Pre HD assessment    08/29/18 1005  Vital Signs  Temp 98.4 F (36.9 C)  Temp Source Oral  Pulse Rate 66  Pulse Rate Source Monitor  Resp 19  BP (!) 171/82  BP Location Left Arm  BP Method Automatic  Patient Position (if appropriate) Lying  Oxygen Therapy  SpO2 97 %  O2 Device Room Air  Pain Assessment  Pain Scale 0-10  Pain Score 0  Dialysis Weight  Weight 108.8 kg  Type of Weight Pre-Dialysis  Time-Out for Hemodialysis  What Procedure? HD  Pt Identifiers(min of two) First/Last Name;MRN/Account#  Correct Site? Yes  Correct Side? Yes  Correct Procedure? Yes  Consents Verified? Yes  Rad Studies Available? N/A  Safety Precautions Reviewed? Yes  Engineer, civil (consulting) Number  ([id number])  Station Number 3  UF/Alarm Test Passed  Conductivity: Meter 13.6  Conductivity: Machine  13.8  pH 7.4  Reverse Osmosis main  Normal Saline Lot Number [id number]  Dialyzer Lot Number [id number]  Disposable Set Lot Number [id number]  Machine Temperature 98.6 F (37 C)  Musician and Audible Yes  Blood Lines Intact and Secured Yes  Pre Treatment Patient Checks  Vascular access used during treatment Catheter  Hepatitis B Surface Antigen Results Negative  Date Hepatitis B Surface Antigen Drawn 08/21/18  Hepatitis B Surface Antibody  (<10)  Date Hepatitis B Surface Antibody Drawn 08/21/18  Hemodialysis Consent Verified Yes  Hemodialysis Standing Orders Initiated Yes  ECG (Telemetry) Monitor On Yes  Prime Ordered Normal Saline  Length of  DialysisTreatment -hour(s) 3.5 Hour(s)  Dialyzer Elisio 17H NR  Dialysate 2K, 2.5 Ca  Dialysis Anticoagulant None  Dialysate Flow Ordered 800  Blood Flow Rate Ordered 400 mL/min  Ultrafiltration Goal 1.5 Liters  Pre Treatment Labs Phosphorus  Dialysis Blood Pressure Support Ordered Normal Saline  Education / Care Plan  Dialysis Education Provided Yes  Documented Education in Care Plan Yes  Hemodialysis Catheter Right Subclavian  Double-lumen;Permanent  Placement Date/Time: 08/26/18 1300   Orientation: Right  Access Location: Subclavian  Hemodialysis Catheter Type: Double-lumen;Permanent  Site Condition No complications  Dressing Type Biopatch  Dressing Status Clean;Dry;Intact  Drainage Description None

## 2018-08-29 NOTE — Progress Notes (Signed)
HD tx end    08/29/18 1352  Vital Signs  Pulse Rate 71  Pulse Rate Source Monitor  Resp (!) 22  BP (!) 142/80  BP Location Left Arm  BP Method Automatic  Patient Position (if appropriate) Lying  Oxygen Therapy  SpO2 95 %  O2 Device Room Air  During Hemodialysis Assessment  Dialysis Fluid Bolus Normal Saline  Bolus Amount (mL) 250 mL  Intra-Hemodialysis Comments Tx completed

## 2018-08-29 NOTE — Progress Notes (Signed)
Post HD assessment. Pt tolerated tx well without c/o or complication. Net UF 1503, goal met.    08/29/18 1400  Vital Signs  Temp 98.6 F (37 C)  Temp Source Oral  Pulse Rate 79  Pulse Rate Source Monitor  Resp (!) 22  BP 140/65  BP Location Left Arm  BP Method Automatic  Patient Position (if appropriate) Lying  Oxygen Therapy  SpO2 94 %  O2 Device Room Air  Dialysis Weight  Weight 108.1 kg  Type of Weight Post-Dialysis  Post-Hemodialysis Assessment  Rinseback Volume (mL) 250 mL  KECN 78 V  Dialyzer Clearance Lightly streaked  Duration of HD Treatment -hour(s) 3.5 hour(s)  Hemodialysis Intake (mL) 500 mL  UF Total -Machine (mL) 2003 mL  Net UF (mL) 1503 mL  Tolerated HD Treatment Yes  Education / Care Plan  Dialysis Education Provided Yes  Documented Education in Care Plan Yes  Hemodialysis Catheter Right Subclavian Double-lumen;Permanent  Placement Date/Time: 08/26/18 1300   Orientation: Right  Access Location: Subclavian  Hemodialysis Catheter Type: Double-lumen;Permanent  Site Condition No complications  Blue Lumen Status Heparin locked  Red Lumen Status Heparin locked  Purple Lumen Status N/A  Catheter fill solution Heparin 1000 units/ml  Catheter fill volume (Arterial) 1.5 cc  Catheter fill volume (Venous) 1.5  Dressing Type Biopatch  Dressing Status Clean;Dry;Intact  Drainage Description None  Post treatment catheter status Capped and Clamped

## 2018-08-29 NOTE — Progress Notes (Signed)
Pre HD assessment    08/29/18 1006  Neurological  Level of Consciousness Alert  Orientation Level Oriented X4  Respiratory  Respiratory Pattern Regular;Unlabored  Chest Assessment Chest expansion symmetrical  Cardiac  Pulse Regular  ECG Monitor Yes  Cardiac Rhythm NSR;SB  Vascular  R Radial Pulse +2  L Radial Pulse +2  Edema Generalized;Right upper extremity;Left upper extremity;Right lower extremity;Left lower extremity  Integumentary  Integumentary (WDL) X  Skin Color Appropriate for ethnicity  Musculoskeletal  Musculoskeletal (WDL) X  Generalized Weakness Yes  Assistive Device None  GU Assessment  Genitourinary (WDL) X  Genitourinary Symptoms  (HD)  Psychosocial  Psychosocial (WDL) WDL

## 2018-08-29 NOTE — Plan of Care (Signed)
The patient had HD today. Awaiting for biopsy results. The patient is independent ambulating. No falls. IV steroids provided. Stable vital signs.  Problem: Education: Goal: Required Educational Video(s) Outcome: Progressing   Problem: Clinical Measurements: Goal: Postoperative complications will be avoided or minimized Outcome: Progressing   Problem: Skin Integrity: Goal: Demonstration of wound healing without infection will improve Outcome: Progressing

## 2018-08-29 NOTE — Progress Notes (Signed)
Post HD assessment    08/29/18 1356  Neurological  Level of Consciousness Alert  Orientation Level Oriented X4  Respiratory  Respiratory Pattern Regular;Unlabored  Chest Assessment Chest expansion symmetrical  Cardiac  Pulse Regular  ECG Monitor Yes  Cardiac Rhythm NSR;SB  Vascular  R Radial Pulse +2  L Radial Pulse +2  Edema Generalized;Right upper extremity;Left upper extremity;Right lower extremity;Left lower extremity  Integumentary  Integumentary (WDL) X  Skin Color Appropriate for ethnicity  Musculoskeletal  Musculoskeletal (WDL) X  Generalized Weakness Yes  Assistive Device None  GU Assessment  Genitourinary (WDL) X  Genitourinary Symptoms  (HD)  Psychosocial  Psychosocial (WDL) WDL

## 2018-08-29 NOTE — Progress Notes (Signed)
[place], [place] 08/29/18  Subjective:  Patient completed hemodialysis today. Renal biopsy was also performed yesterday.   Resting comfortably. PR 3 antibody found to be negative.  Objective:  Vital signs in last 24 hours:  Temp:  [97.4 F (36.3 C)-98.6 F (37 C)] 98.2 F (36.8 C) (02/27 1430) Pulse Rate:  [51-80] 77 (02/27 1430) Resp:  [15-24] 16 (02/27 1430) BP: (123-177)/(62-93) 147/68 (02/27 1430) SpO2:  [90 %-97 %] 95 % (02/27 1430) Weight:  [108 kg-108.8 kg] 108.1 kg (02/27 1400)  Weight change: 0.202 kg Filed Weights   08/29/18 0500 08/29/18 1005 08/29/18 1400  Weight: 108 kg 108.8 kg 108.1 kg    Intake/Output:    Intake/Output Summary (Last 24 hours) at 08/29/2018 1521 Last data filed at 08/29/2018 1400 Gross per 24 hour  Intake 840 ml  Output 1603 ml  Net -763 ml     Physical Exam: General:  No acute distress  HEENT  anicteric, moist oral mucous membranes  Neck  supple  Pulm/lungs  normal breathing effort, clear to auscultation  CVS/Heart  no rub  Abdomen:   Soft, nontender  Extremities:  + Lower extremity edema  Neurologic:  Alert, oriented x 3, follows commands  Skin:  No acute rashes  Access:  Permcath in place, right femoral dialysis catheter       Basic Metabolic Panel:  Recent Labs  Lab 08/24/18 0404 08/25/18 0511 08/26/18 0433 08/27/18 0324 08/28/18 0627 08/29/18 1039  NA 138 139 138 137 139  --   K 3.9 3.9 4.0 4.4 4.7  --   CL 102 100 98 99 98  --   CO2 24 29 26 25 29   --   GLUCOSE 98 93 90 97 115*  --   BUN 66* 42* 60* 75* 49*  --   CREATININE 8.58* 6.46* 8.59* 10.20* 7.14*  --   CALCIUM 7.7* 7.8* 8.0* 8.1* 8.1*  --   PHOS 7.6*  --   --   --   --  5.7*     CBC: Recent Labs  Lab 08/24/18 0404 08/27/18 0324 08/28/18 1745  WBC 9.8 11.3*  --   HGB 9.1* 9.7* 9.8*  HCT 27.8* 29.3* 30.7*  MCV 87.1 88.0  --   PLT 268 272  --       Lab Results  Component Value Date   HEPBSAG Negative  08/21/2018   HEPBSAB Non Reactive 08/21/2018      Microbiology:  Recent Results (from the past 240 hour(s))  Urine Culture     Status: None   Collection Time: 08/21/18  9:34 AM  Result Value Ref Range Status   Specimen Description   Final    URINE, RANDOM Performed at [facility], [address], [place], [place] [address]    Special Requests   Final    NONE Performed at [facility], [address] [place],  [address]    Culture   Final    NO GROWTH Performed at [facility], [place] [address], [place],  [address]    Report Status 08/22/2018 FINAL  Final  MRSA PCR Screening     Status: None   Collection Time: 08/22/18  1:48 AM  Result Value Ref Range Status   MRSA by PCR NEGATIVE NEGATIVE Final    Comment:        The GeneXpert MRSA Assay (FDA approved for NASAL specimens only), is one component of a comprehensive MRSA colonization surveillance program.  It is not intended to diagnose MRSA infection nor to guide or monitor treatment for MRSA infections. Performed at [facility], [address] [place], [place] [address]   Gastrointestinal Panel by PCR , Stool     Status: None   Collection Time: 08/23/18 12:56 PM  Result Value Ref Range Status   Campylobacter species NOT DETECTED NOT DETECTED Final   Plesimonas shigelloides NOT DETECTED NOT DETECTED Final   Salmonella species NOT DETECTED NOT DETECTED Final   Yersinia enterocolitica NOT DETECTED NOT DETECTED Final   Vibrio species NOT DETECTED NOT DETECTED Final   Vibrio cholerae NOT DETECTED NOT DETECTED Final   Enteroaggregative E coli (EAEC) NOT DETECTED NOT DETECTED Final   Enteropathogenic E coli (EPEC) NOT DETECTED NOT DETECTED Final   Enterotoxigenic E coli (ETEC) NOT DETECTED NOT DETECTED Final   Shiga like toxin producing E coli (STEC) NOT DETECTED NOT DETECTED Final   Shigella/Enteroinvasive E coli (EIEC) NOT DETECTED NOT DETECTED Final   Cryptosporidium  NOT DETECTED NOT DETECTED Final   Cyclospora cayetanensis NOT DETECTED NOT DETECTED Final   Entamoeba histolytica NOT DETECTED NOT DETECTED Final   Giardia lamblia NOT DETECTED NOT DETECTED Final   Adenovirus F40/41 NOT DETECTED NOT DETECTED Final   Astrovirus NOT DETECTED NOT DETECTED Final   Norovirus GI/GII NOT DETECTED NOT DETECTED Final   Rotavirus A NOT DETECTED NOT DETECTED Final   Sapovirus (I, II, IV, and V) NOT DETECTED NOT DETECTED Final    Comment: Performed at [facility], [address] [place], [place] [address]    Coagulation Studies: Recent Labs    08/28/18 0835  LABPROT 13.3  INR 1.0    Urinalysis: No results for input(s): COLORURINE, LABSPEC, PHURINE, GLUCOSEU, HGBUR, BILIRUBINUR, KETONESUR, PROTEINUR, UROBILINOGEN, NITRITE, LEUKOCYTESUR in the last 72 hours.  Invalid input(s): APPERANCEUR    Imaging: US Biopsy (kidney)  Result Date: 08/28/2018 INDICATION: 66 year old with acute kidney injury.  Request for a renal biopsy. EXAM: ULTRASOUND-GUIDED LEFT RENAL BIOPSY MEDICATIONS: None. ANESTHESIA/SEDATION: Fentanyl 25 mcg The patient's level of consciousness and vital signs were monitored continuously by radiology nursing throughout the procedure under my direct supervision. FLUOROSCOPY TIME:  None COMPLICATIONS: None immediate. PROCEDURE: Informed written consent was obtained from the patient after a thorough discussion of the procedural risks, benefits and alternatives. All questions were addressed. A timeout was performed prior to the initiation of the procedure. Patient was placed prone. Both kidneys were evaluated with ultrasound. The left kidney lower pole was targeted for biopsy. The left flank was prepped with chlorhexidine and sterile field was created. Skin and soft tissues were anesthetized with 1% lidocaine. Using ultrasound guidance, 16 gauge core biopsy was obtained from the left kidney lower pole. Adequate specimens obtained and placed on a Telfa  pad with saline. A second 16 gauge core biopsy was obtained from the left kidney lower pole. Again, an adequate core biopsy was obtained. Bandage placed over the puncture site. FINDINGS: Both kidneys are echogenic without hydronephrosis. Core biopsies obtained from the left kidney lower pole. Adequate core biopsies were obtained. Negative for hematoma or bleeding after the procedure. IMPRESSION: Successful ultrasound-guided core biopsies of the left kidney lower pole. Electronically Signed   By: Markus Daft M.D.   On: 08/28/2018 16:04     Medications:   . sodium chloride    . sodium chloride 15 mL (08/29/18 1511)  . methylPREDNISolone (SOLU-MEDROL) injection 1,000 mg (08/29/18 1513)   . [START ON 08/30/2018] amLODipine  10 mg Oral Daily  .  Chlorhexidine Gluconate Cloth  6 each Topical Q0600  . feeding supplement (NEPRO CARB STEADY)  237 mL Oral BID BM  . heparin injection (subcutaneous)  5,000 Units Subcutaneous Q8H  . metoprolol tartrate  100 mg Oral BID  . multivitamin  1 tablet Oral QHS  . mupirocin ointment   Nasal TID  . nitroGLYCERIN  0.5 inch Topical Q6H   sodium chloride, acetaminophen **OR** acetaminophen, butalbital-acetaminophen-caffeine, hydrALAZINE, ondansetron **OR** ondansetron (ZOFRAN) IV, oxymetazoline, polyethylene glycol  Assessment/ Plan:  53 y.o. Caucasian female with medical problems of bipolar disorder pretension, depression, anxiety, was admitted on 08/21/2018 with 2 weeks of headache, acute renal failure, severe hyperkalemia.   Acute kidney injury with severe hyperkalemia, oliguric.  -Cause of acute kidney injury is unclear.  Patient's baseline creatinine is 0.55 on December 14, 2016.  Renal imaging in the form of CT without IV contrast is negative for obstruction or stone. C-ANCA postiive, however PR-3 abs negative as is MPO ab.  Renal biopsy performed 08/28/18.   -c-ANCA antibody was positive however more specific testing including PR 3 and MPO antibody were actually  found to be normal.  Renal biopsy has been performed.  We are awaiting results of this.  However in light of proteinuria and hematuria we will go ahead and start the patient on Solu-Medrol.  Patient completed hemodialysis today.  Hyperkalemia Potassium normalized to 4.7.  Continue to monitor.  Proteinuria, hematuria, pyuria noted on urinalysis Urine culture is negative Urine protein to creatinine ratio is 6.4 g.  We will repeat when urine output improves -renal biopsy has been performed. Awaiting result.  Hyperphosphatemia Phosphorus down to 5.7.  Continue to monitor.   LOS: 8 Lori-Ann Lindfors 2/27/20203:21 PM  [place], [place]  Note: This note was prepared with Dragon dictation. Any transcription errors are unintentional

## 2018-08-30 MED ORDER — SODIUM CHLORIDE 0.9 % IV SOLN
Freq: Once | INTRAVENOUS | Status: AC
  Administered 2018-08-30: 16:00:00 via INTRAVENOUS

## 2018-08-30 NOTE — Progress Notes (Signed)
[facility] [place] NAME: Jennifer Newman    MR#:  [id number]  DATE OF BIRTH:  06-Nov-1965  SUBJECTIVE:  CHIEF COMPLAINT:   Chief Complaint  Patient presents with  . Headache  . Epistaxis  Patient seen and evaluated today Has no nasal bleed No abdominal pain No flank pain  REVIEW OF SYSTEMS:    ROS  CONSTITUTIONAL: No documented fever. No fatigue, weakness. No weight gain, no weight loss.  EYES: No blurry or double vision.  ENT: No tinnitus. No postnasal drip. No redness of the oropharynx.  Had some nasal bleed RESPIRATORY: No cough, no wheeze, no hemoptysis. No dyspnea.  CARDIOVASCULAR: No chest pain. No orthopnea. No palpitations. No syncope.  GASTROINTESTINAL: No nausea, no vomiting or diarrhea. No abdominal pain. No melena or hematochezia.  GENITOURINARY: No dysuria or hematuria.  ENDOCRINE: No polyuria or nocturia. No heat or cold intolerance.  HEMATOLOGY: No anemia. No bruising. No bleeding.  INTEGUMENTARY: No rashes. No lesions.  MUSCULOSKELETAL: No arthritis. No swelling. No gout.  NEUROLOGIC: No numbness, tingling, or ataxia. No seizure-type activity.  PSYCHIATRIC: No anxiety. No insomnia. No ADD.   DRUG ALLERGIES:  No Known Allergies  VITALS:  Blood pressure 140/76, pulse 65, temperature 97.6 F (36.4 C), temperature source Oral, resp. rate 16, height 5\' 3"  (1.6 m), weight 107.6 kg, last menstrual period 04/09/2018, SpO2 96 %.  PHYSICAL EXAMINATION:   Physical Exam  GENERAL:  53 y.o.-year-old patient lying in the bed with no acute distress.  EYES: Pupils equal, round, reactive to light and accommodation. No scleral icterus. Extraocular muscles intact.  HEENT: Head atraumatic, normocephalic. Oropharynx and nasopharynx clear.  NECK:  Supple, no jugular venous distention. No thyroid enlargement, no tenderness.  LUNGS: Normal breath sounds bilaterally, no wheezing, rales, rhonchi. No use of accessory muscles of  respiration.  CARDIOVASCULAR: S1, S2 normal. No murmurs, rubs, or gallops.  ABDOMEN: Soft, nontender, nondistended. Bowel sounds present. No organomegaly or mass.  EXTREMITIES: No cyanosis, clubbing or edema b/l.    NEUROLOGIC: Cranial nerves II through XII are intact. No focal Motor or sensory deficits b/l.   PSYCHIATRIC: The patient is alert and oriented x 3.  SKIN: No obvious rash, lesion, or ulcer.   LABORATORY PANEL:   CBC Recent Labs  Lab 08/27/18 0324 08/28/18 1745  WBC 11.3*  --   HGB 9.7* 9.8*  HCT 29.3* 30.7*  PLT 272  --    ------------------------------------------------------------------------------------------------------------------ Chemistries  Recent Labs  Lab 08/28/18 0627  NA 139  K 4.7  CL 98  CO2 29  GLUCOSE 115*  BUN 49*  CREATININE 7.14*  CALCIUM 8.1*   ------------------------------------------------------------------------------------------------------------------  Cardiac Enzymes No results for input(s): TROPONINI in the last 168 hours. ------------------------------------------------------------------------------------------------------------------  RADIOLOGY:  US Biopsy (kidney)  Result Date: 08/28/2018 INDICATION: 76 year old with acute kidney injury.  Request for a renal biopsy. EXAM: ULTRASOUND-GUIDED LEFT RENAL BIOPSY MEDICATIONS: None. ANESTHESIA/SEDATION: Fentanyl 25 mcg The patient's level of consciousness and vital signs were monitored continuously by radiology nursing throughout the procedure under my direct supervision. FLUOROSCOPY TIME:  None COMPLICATIONS: None immediate. PROCEDURE: Informed written consent was obtained from the patient after a thorough discussion of the procedural risks, benefits and alternatives. All questions were addressed. A timeout was performed prior to the initiation of the procedure. Patient was placed prone. Both kidneys were evaluated with ultrasound. The left kidney lower pole was targeted for biopsy. The  left flank was prepped with chlorhexidine and sterile field was created.  Skin and soft tissues were anesthetized with 1% lidocaine. Using ultrasound guidance, 16 gauge core biopsy was obtained from the left kidney lower pole. Adequate specimens obtained and placed on a Telfa pad with saline. A second 16 gauge core biopsy was obtained from the left kidney lower pole. Again, an adequate core biopsy was obtained. Bandage placed over the puncture site. FINDINGS: Both kidneys are echogenic without hydronephrosis. Core biopsies obtained from the left kidney lower pole. Adequate core biopsies were obtained. Negative for hematoma or bleeding after the procedure. IMPRESSION: Successful ultrasound-guided core biopsies of the left kidney lower pole. Electronically Signed   By: Markus Daft M.D.   On: 08/28/2018 16:04     ASSESSMENT AND PLAN:   53 year old female patient with history of hypertension, bipolar disorder, OCD, tachycardia, vertigo currently under hospitalist service for acute renal failure.  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 Renal biopsy done , awaiting results Start chemotherapy as per nephrology that is cyclophosphamide and rituximab IV IV Solu-Medrol on board Dialysis schedule as per nephrology  -Hyperkalemia Potassium normalized Monitor  -Hyperphosphatemia improved Continue monitoring  -Proteinuria, hematuria and pyuria based on urinalysis Appreciate nephrology evaluation Await renal biopsy results  -Nasal bleed Resolved ENT consult done Clinical monitoring No packing recommended  -Obesity Weight loss counseling given  -Diarrhea self-limiting  -Hypertension Continue oral metoprolol for control of blood pressure  -DVT prophylaxis Subcu heparin to continue  All the records are reviewed and case discussed with Care Management/Social Worker. Management plans discussed  with the patient, family and they are in agreement.  CODE STATUS: Full code  DVT Prophylaxis: SCDs  TOTAL TIME TAKING CARE OF THIS PATIENT: 36 minutes.   POSSIBLE D/C IN 2 to 3 DAYS, DEPENDING ON CLINICAL CONDITION.  Saundra Shelling M.D on 08/30/2018 at 12:03 PM  Between 7am to 6pm - Pager - [phone]  After 6pm go to [email or web address] - password [email or web address] [facility]  Office  [phone]  CC: Primary care physician; Barbaraann Boys, MD  Note: This dictation was prepared with Dragon dictation along with smaller phrase technology. Any transcriptional errors that result from this process are unintentional.

## 2018-08-30 NOTE — Progress Notes (Signed)
Cyclophosphamide and Rituximab Consult.   Cancer center called and said the chemo is ready for pick. I will go pick up the chemo and bring it to the floor. Will need to find a RN that can administer the chemo.    Thanks,   Eleonore Chiquito, PharmD, BCPS

## 2018-08-30 NOTE — Care Management (Signed)
Per Elvera Bicker HD liaison out patient HD schedule is as follows. TTS 11:00 at [facility]

## 2018-08-30 NOTE — Progress Notes (Signed)
Initial Nutrition Assessment  DOCUMENTATION CODES:   Morbid obesity  INTERVENTION:   - Continue Nepro Shake po BID, each supplement provides 425 kcal and 19 grams protein  - Continue daily renal MVI  NUTRITION DIAGNOSIS:   Increased nutrient needs related to acute illness (AKI and vasculitis requiring HD) as evidenced by estimated needs.  GOAL:   Patient will meet greater than or equal to 90% of their needs  MONITOR:   PO intake, Supplement acceptance, I & O's, Labs, Weight trends  REASON FOR ASSESSMENT:   LOS    ASSESSMENT:   53 year old who presented to the ED on 2/29 with headache x 2 weeks. PMH significant for HTN, bipolar disorder, anxiety, depression, PTSD. Pt admitted with AKI, hyperkalemia, headache.  2/20 - temp HD cath placement for urgent HD, first HD treatment 2/24 - tunneled HD cath placed 2/25 - positive c-ANCA 2/26 - s/p renal biopsy  Pt to start chemotherapy today per Nephrology (cyclophosphamide and rituximab IV).  Spoke with pt, daughter, and sister-in-law at bedside. The  Majority of information was obtained from the pt herself.  Pt reports that PTA, she had a good appetite and ate 2 meals daily. Pt reports that she didn't eat on a schedule due to her husband working odd hours. For example, pt may eat a banana sandwich for breakfast at 2:30 am and then eat lunch between 3:00-5:00 pm.  Pt reports that at the beginning of her admission, her appetite decreased but that it is beginning to improve. Pt was provided with a lunch tray at time of visit but states she did not order it. RD obtained pt's lunch order and a new tray was sent up (chicken salad sandwich) per pt's request.  Pt very concerned about what she needs to eat after d/c. RD counseled that an RD would return at a later date for diet education if pt were to require long-term HD but that this decision had not yet been made per this RD's understanding. Will continue to follow.  Pt is unsure of  her UBW and whether she has lost weight as she does not weigh herself regularly.  Pt states that she likes the Nepro and would like to continue receiving it. Pt with questions about the purpose of Nepro. All questions answered. RD also confirmed with pt that she is taking her daily rena-vit as ordered.  Reviewed pt's weight trends during admission. Pt's weight has fluctuated between 104.3-114.5 kg since admission. Suspect weight fluctuations related to fluid status and HD treatments.  Meal Completion: 80-100% x 2 meals recorded  Medications reviewed and include: Nepro BID, rena-vit, Solu-Medrol  Labs reviewed: phosphorus 5.7 (H), BUN 49 (H), creatinine 7.14 (H), K+ WNL  Net UF 2/27: 1503 ml Post-HD weight: 107.6 kg UOP: 0 ml recorded x 24 hours  NUTRITION - FOCUSED PHYSICAL EXAM:    Most Recent Value  Orbital Region  No depletion  Upper Arm Region  No depletion  Thoracic and Lumbar Region  No depletion  Buccal Region  No depletion  Temple Region  No depletion  Clavicle Bone Region  No depletion  Clavicle and Acromion Bone Region  No depletion  Scapular Bone Region  Unable to assess  Dorsal Hand  No depletion  Patellar Region  No depletion  Anterior Thigh Region  No depletion  Posterior Calf Region  No depletion  Edema (RD Assessment)  Mild [generalized]  Hair  Reviewed  Eyes  Reviewed  Mouth  Reviewed  Skin  Reviewed  Nails  Reviewed       Diet Order:   Diet Order            Diet renal with fluid restriction Fluid restriction: 1500 mL Fluid; Room service appropriate? Yes; Fluid consistency: Thin  Diet effective now              EDUCATION NEEDS:   No education needs have been identified at this time  Skin:  Skin Assessment: Reviewed RN Assessment  Last BM:  2/26 large type 6  Height:   Ht Readings from Last 1 Encounters:  08/26/18 5\' 3"  (1.6 m)    Weight:   Wt Readings from Last 1 Encounters:  08/30/18 107.6 kg    Ideal Body Weight:  52.3  kg  BMI:  Body mass index is 42.02 kg/m.  Estimated Nutritional Needs:   Kcal:  2000-2200  Protein:  110-125 grams  Fluid:  UOP + 1000 ml    Gaynell Face, MS, RD, LDN Inpatient Clinical Dietitian Pager: [phone] Weekend/After Hours: [phone]

## 2018-08-30 NOTE — Progress Notes (Signed)
The patient has been transfer to room 115. Sitting up in the chair hoping to tolerate sitting for 4 hours.

## 2018-08-30 NOTE — Progress Notes (Signed)
[place], [place] 08/30/18  Subjective:  [facility] nephropathology did call a preliminary biopsy results yesterday. 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  Next lines is involving 90% right kidney. There is also some element of acute tubular necrosis. Case also discussed with the [facility] nephrology and we are considering treatment with both cyclophosphamide and rituximab.  Objective:  Vital signs in last 24 hours:  Temp:  [97.5 F (36.4 C)-98.7 F (37.1 C)] 97.7 F (36.5 C) (02/28 1510) Pulse Rate:  [62-72] 63 (02/28 1510) Resp:  [16-18] 17 (02/28 1434) BP: (128-158)/(73-88) 129/73 (02/28 1510) SpO2:  [94 %-96 %] 94 % (02/28 1510) Weight:  [107.6 kg] 107.6 kg (02/28 0500)  Weight change: 0.799 kg Filed Weights   08/29/18 1005 08/29/18 1400 08/30/18 0500  Weight: 108.8 kg 108.1 kg 107.6 kg    Intake/Output:    Intake/Output Summary (Last 24 hours) at 08/30/2018 1522 Last data filed at 08/30/2018 0426 Gross per 24 hour  Intake 108.46 ml  Output 0 ml  Net 108.46 ml     Physical Exam: General:  No acute distress  HEENT  anicteric, moist oral mucous membranes  Neck  supple  Pulm/lungs  normal breathing effort, clear to auscultation  CVS/Heart  no rub  Abdomen:   Soft, nontender  Extremities:  + Lower extremity edema  Neurologic:  Alert, oriented x 3, follows commands  Skin:  No acute rashes  Access:  Permcath in place       Basic Metabolic Panel:  Recent Labs  Lab 08/24/18 0404 08/25/18 0511 08/26/18 0433 08/27/18 0324 08/28/18 0627 08/29/18 1039  NA 138 139 138 137 139  --   K 3.9 3.9 4.0 4.4 4.7  --   CL 102 100 98 99 98  --   CO2 24 29 26 25 29   --   GLUCOSE 98 93 90 97 115*  --   BUN 66* 42* 60* 75* 49*  --   CREATININE 8.58* 6.46* 8.59* 10.20* 7.14*  --   CALCIUM 7.7* 7.8* 8.0* 8.1* 8.1*  --   PHOS 7.6*  --   --   --   --  5.7*     CBC: Recent Labs  Lab 08/24/18 0404 08/27/18 0324  08/28/18 1745  WBC 9.8 11.3*  --   HGB 9.1* 9.7* 9.8*  HCT 27.8* 29.3* 30.7*  MCV 87.1 88.0  --   PLT 268 272  --       Lab Results  Component Value Date   HEPBSAG Negative 08/21/2018   HEPBSAB Non Reactive 08/21/2018      Microbiology:  Recent Results (from the past 240 hour(s))  Urine Culture     Status: None   Collection Time: 08/21/18  9:34 AM  Result Value Ref Range Status   Specimen Description   Final    URINE, RANDOM Performed at [facility], [address] [place], [place] [address]    Special Requests   Final    NONE Performed at [facility], [address], [place], [place] [address]    Culture   Final    NO GROWTH Performed at [facility], [place] [address] [place], [place] [address]    Report Status 08/22/2018 FINAL  Final  MRSA PCR Screening     Status: None   Collection Time: 08/22/18  1:48 AM  Result Value Ref Range Status   MRSA by PCR NEGATIVE NEGATIVE Final  Comment:        The GeneXpert MRSA Assay (FDA approved for NASAL specimens only), is one component of a comprehensive MRSA colonization surveillance program. It is not intended to diagnose MRSA infection nor to guide or monitor treatment for MRSA infections. Performed at [facility], [address] [place], [place] [address]   Gastrointestinal Panel by PCR , Stool     Status: None   Collection Time: 08/23/18 12:56 PM  Result Value Ref Range Status   Campylobacter species NOT DETECTED NOT DETECTED Final   Plesimonas shigelloides NOT DETECTED NOT DETECTED Final   Salmonella species NOT DETECTED NOT DETECTED Final   Yersinia enterocolitica NOT DETECTED NOT DETECTED Final   Vibrio species NOT DETECTED NOT DETECTED Final   Vibrio cholerae NOT DETECTED NOT DETECTED Final   Enteroaggregative E coli (EAEC) NOT DETECTED NOT DETECTED Final   Enteropathogenic E coli (EPEC) NOT DETECTED NOT DETECTED Final   Enterotoxigenic E coli (ETEC) NOT DETECTED  NOT DETECTED Final   Shiga like toxin producing E coli (STEC) NOT DETECTED NOT DETECTED Final   Shigella/Enteroinvasive E coli (EIEC) NOT DETECTED NOT DETECTED Final   Cryptosporidium NOT DETECTED NOT DETECTED Final   Cyclospora cayetanensis NOT DETECTED NOT DETECTED Final   Entamoeba histolytica NOT DETECTED NOT DETECTED Final   Giardia lamblia NOT DETECTED NOT DETECTED Final   Adenovirus F40/41 NOT DETECTED NOT DETECTED Final   Astrovirus NOT DETECTED NOT DETECTED Final   Norovirus GI/GII NOT DETECTED NOT DETECTED Final   Rotavirus A NOT DETECTED NOT DETECTED Final   Sapovirus (I, II, IV, and V) NOT DETECTED NOT DETECTED Final    Comment: Performed at [facility], [address] [place], [place] [address]    Coagulation Studies: Recent Labs    08/28/18 0835  LABPROT 13.3  INR 1.0    Urinalysis: No results for input(s): COLORURINE, LABSPEC, PHURINE, GLUCOSEU, HGBUR, BILIRUBINUR, KETONESUR, PROTEINUR, UROBILINOGEN, NITRITE, LEUKOCYTESUR in the last 72 hours.  Invalid input(s): APPERANCEUR    Imaging: No results found.   Medications:   . sodium chloride    . sodium chloride 250 mL (08/30/18 0908)  . methylPREDNISolone (SOLU-MEDROL) injection 1,000 mg (08/30/18 0910)   . amLODipine  10 mg Oral Daily  . Chlorhexidine Gluconate Cloth  6 each Topical Q0600  . cyclophosphamide  7.5 mg/kg Intravenous Once  . feeding supplement (NEPRO CARB STEADY)  237 mL Oral BID BM  . heparin injection (subcutaneous)  5,000 Units Subcutaneous Q8H  . metoprolol tartrate  100 mg Oral BID  . multivitamin  1 tablet Oral QHS  . nitroGLYCERIN  0.5 inch Topical Q6H  . [START ON 09/02/2018] riTUXimab (RITUXAN) IV infusion  375 mg/m2 Intravenous Once  . sulfamethoxazole-trimethoprim  20 mL Oral Daily   sodium chloride, acetaminophen **OR** acetaminophen, butalbital-acetaminophen-caffeine, hydrALAZINE, ondansetron **OR** ondansetron (ZOFRAN) IV, oxymetazoline, polyethylene  glycol  Assessment/ Plan:  53 y.o. Caucasian female with medical problems of bipolar disorder pretension, depression, anxiety, was admitted on 08/21/2018 with 2 weeks of headache, acute renal failure, severe hyperkalemia.   Acute kidney injury with severe hyperkalemia, oliguric.  -Cause of acute kidney injury is unclear.  Patient's baseline creatinine is 0.55 on December 14, 2016.  Renal imaging in the form of CT without IV contrast is negative for obstruction or stone. C-ANCA postiive, however PR-3 abs negative as is MPO ab.  Renal biopsy performed 08/28/18.   -c-ANCA antibody was positive however more specific testing including PR 3 and MPO antibody were actually found  to be normal.  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  Some element of ATN also noted.  -  We will now proceed with both rituximab at 375 mg/m as well as cyclophosphamide given at 7 mg/kg per rituxvas trial.  Case was also discussed with [facility] nephrology.  She may potentially also benefit from plasmapheresis however this should be done after 48 hours from rituximab administration to avoid removing rituximab.  Patient will receive cyclophosphamide dosage today and will receive rituximab onMonday.  In the interim she will continue Solu-Medrol 1 g for one additional day.  We are also planning for dialysis tomorrow.  Hyperkalemia Resolved with HD, continue to monitor.   Proteinuria, hematuria, pyuria noted on urinalysis Urine culture is negative Urine protein to creatinine ratio is 6.4 g.  We will repeat when urine output improves -renal biopsy has been performed, and results as above.   Hyperphosphatemia Phosphorus down to 5.7.  Continue to monitor.   LOS: 9 Linlee Cromie 2/28/20203:22 PM  [place], [place]  Note: This note was prepared with Dragon dictation. Any transcription errors are unintentional

## 2018-08-31 LAB — BASIC METABOLIC PANEL WITH GFR
Anion gap: 16 — ABNORMAL HIGH (ref 5–15)
BUN: 88 mg/dL — ABNORMAL HIGH (ref 6–20)
CO2: 25 mmol/L (ref 22–32)
Calcium: 8.4 mg/dL — ABNORMAL LOW (ref 8.9–10.3)
Chloride: 97 mmol/L — ABNORMAL LOW (ref 98–111)
Creatinine, Ser: 7.36 mg/dL — ABNORMAL HIGH (ref 0.44–1.00)
GFR calc Af Amer: 7 mL/min — ABNORMAL LOW (ref >60)
GFR calc non Af Amer: 6 mL/min — ABNORMAL LOW (ref >60)
Glucose, Bld: 163 mg/dL — ABNORMAL HIGH (ref 70–99)
Potassium: 5.4 mmol/L — ABNORMAL HIGH (ref 3.5–5.1)
Sodium: 138 mmol/L (ref 135–145)

## 2018-08-31 LAB — CBC
HCT: 27.4 % — ABNORMAL LOW (ref 36.0–46.0)
Hemoglobin: 8.8 g/dL — ABNORMAL LOW (ref 12.0–15.0)
MCH: 28.9 pg (ref 26.0–34.0)
MCHC: 32.1 g/dL (ref 30.0–36.0)
MCV: 90.1 fL (ref 80.0–100.0)
Platelets: 301 10*3/uL (ref 150–400)
RBC: 3.04 MIL/uL — ABNORMAL LOW (ref 3.87–5.11)
RDW: 14.4 % (ref 11.5–15.5)
WBC: 16.5 10*3/uL — ABNORMAL HIGH (ref 4.0–10.5)
nRBC: 0 % (ref 0.0–0.2)

## 2018-08-31 MED ORDER — EPOETIN ALFA 10000 UNIT/ML IJ SOLN
10000.0000 [IU] | INTRAMUSCULAR | Status: DC
  Administered 2018-08-31 – 2018-09-03 (×2): 10000 [IU] via INTRAVENOUS
  Filled 2018-08-31: qty 1

## 2018-08-31 NOTE — Progress Notes (Signed)
Pre HD assessment    08/31/18 0903  Neurological  Level of Consciousness Alert  Orientation Level Oriented X4  Respiratory  Respiratory Pattern Regular;Unlabored  Chest Assessment Chest expansion symmetrical  Cardiac  Pulse Regular  ECG Monitor Yes  Cardiac Rhythm SB;NSR  Vascular  R Radial Pulse +2  L Radial Pulse +2  Edema Generalized;Right upper extremity;Left upper extremity;Right lower extremity;Left lower extremity  Integumentary  Integumentary (WDL) X  Skin Color Appropriate for ethnicity  Musculoskeletal  Musculoskeletal (WDL) X  Generalized Weakness Yes  Assistive Device None  GU Assessment  Genitourinary (WDL) X  Genitourinary Symptoms  (HD)  Psychosocial  Psychosocial (WDL) WDL

## 2018-08-31 NOTE — Progress Notes (Signed)
Post HD assessment    08/31/18 1248  Neurological  Level of Consciousness Alert  Orientation Level Oriented X4  Respiratory  Respiratory Pattern Regular;Unlabored  Chest Assessment Chest expansion symmetrical  Cardiac  Pulse Regular  ECG Monitor Yes  Cardiac Rhythm NSR;SB  Vascular  R Radial Pulse +2  L Radial Pulse +2  Edema Generalized;Right upper extremity;Left upper extremity;Right lower extremity;Left lower extremity  Integumentary  Integumentary (WDL) X  Skin Color Appropriate for ethnicity  Musculoskeletal  Musculoskeletal (WDL) X  Generalized Weakness Yes  Assistive Device None  GU Assessment  Genitourinary (WDL) X  Genitourinary Symptoms  (HD)  Psychosocial  Psychosocial (WDL) WDL

## 2018-08-31 NOTE — Progress Notes (Signed)
HD tx start    08/31/18 0908  Vital Signs  Pulse Rate 62  Pulse Rate Source Monitor  Resp (!) 23  BP (!) 151/90  BP Location Right Arm  BP Method Automatic  Patient Position (if appropriate) Lying  Oxygen Therapy  SpO2 97 %  O2 Device Room Air  During Hemodialysis Assessment  Blood Flow Rate (mL/min) 400 mL/min  Arterial Pressure (mmHg) -130 mmHg  Venous Pressure (mmHg) 110 mmHg  Transmembrane Pressure (mmHg) 60 mmHg  Ultrafiltration Rate (mL/min) 570 mL/min  Dialysate Flow Rate (mL/min) 800 ml/min  Conductivity: Machine  13.9  HD Safety Checks Performed Yes  Dialysis Fluid Bolus Normal Saline  Bolus Amount (mL) 250 mL  Intra-Hemodialysis Comments Tx initiated  Hemodialysis Catheter Right Subclavian Double-lumen;Permanent  Placement Date/Time: 08/26/18 1300   Orientation: Right  Access Location: Subclavian  Hemodialysis Catheter Type: Double-lumen;Permanent  Blue Lumen Status Infusing  Red Lumen Status Infusing

## 2018-08-31 NOTE — Progress Notes (Signed)
[facility]  ROUNDING NOTE   Subjective:   Seen and examined on hemodialysis treatment. UF of 1.5 liters. Laying in bed. Tolerating treatment well.    HEMODIALYSIS FLOWSHEET:  Blood Flow Rate (mL/min): 400 mL/min Arterial Pressure (mmHg): -170 mmHg Venous Pressure (mmHg): 130 mmHg Transmembrane Pressure (mmHg): 40 mmHg Ultrafiltration Rate (mL/min): 570 mL/min Dialysate Flow Rate (mL/min): 800 ml/min Conductivity: Machine : 13.9 Conductivity: Machine : 13.9 Dialysis Fluid Bolus: Normal Saline Bolus Amount (mL): 250 mL Dialysate Change: 1K    Objective:  Vital signs in last 24 hours:  Temp:  [97.5 F (36.4 C)-98.1 F (36.7 C)] 97.6 F (36.4 C) (02/29 0902) Pulse Rate:  [55-68] 63 (02/29 1130) Resp:  [16-26] 21 (02/29 1130) BP: (128-160)/(73-97) 138/79 (02/29 1130) SpO2:  [92 %-97 %] 92 % (02/29 1115) Weight:  [108.5 kg-108.9 kg] 108.9 kg (02/29 0902)  Weight change: -0.3 kg Filed Weights   08/30/18 0500 08/31/18 0700 08/31/18 0902  Weight: 107.6 kg 108.5 kg 108.9 kg    Intake/Output: I/O last 3 completed shifts: In: 244.2 [P.O.:240; I.V.:2.5; IV Piggyback:1.7] Out: 0    Intake/Output this shift:  No intake/output data recorded.  Physical Exam: General: NAD,   Head: Normocephalic, atraumatic. Moist oral mucosal membranes  Eyes: Anicteric, PERRL  Neck: Supple, trachea midline  Lungs:  Clear to auscultation  Heart: Regular rate and rhythm  Abdomen:  Soft, nontender, obese  Extremities:  1+ peripheral edema.  Neurologic: Nonfocal, moving all four extremities  Skin: No lesions  Access: RIJ permcath    Basic Metabolic Panel: Recent Labs  Lab 08/25/18 0511 08/26/18 0433 08/27/18 0324 08/28/18 0627 08/29/18 1039 08/31/18 0429  NA 139 138 137 139  --  138  K 3.9 4.0 4.4 4.7  --  5.4*  CL 100 98 99 98  --  97*  CO2 29 26 25 29   --  25  GLUCOSE 93 90 97 115*  --  163*  BUN 42* 60* 75* 49*  --  88*  CREATININE 6.46* 8.59* 10.20* 7.14*  --   7.36*  CALCIUM 7.8* 8.0* 8.1* 8.1*  --  8.4*  PHOS  --   --   --   --  5.7*  --     Liver Function Tests: No results for input(s): AST, ALT, ALKPHOS, BILITOT, PROT, ALBUMIN in the last 168 hours. No results for input(s): LIPASE, AMYLASE in the last 168 hours. No results for input(s): AMMONIA in the last 168 hours.  CBC: Recent Labs  Lab 08/27/18 0324 08/28/18 1745 08/31/18 0429  WBC 11.3*  --  16.5*  HGB 9.7* 9.8* 8.8*  HCT 29.3* 30.7* 27.4*  MCV 88.0  --  90.1  PLT 272  --  301    Cardiac Enzymes: No results for input(s): CKTOTAL, CKMB, CKMBINDEX, TROPONINI in the last 168 hours.  BNP: Invalid input(s): POCBNP  CBG: No results for input(s): GLUCAP in the last 168 hours.  Microbiology: Results for orders placed or performed during the hospital encounter of 08/21/18  Urine Culture     Status: None   Collection Time: 08/21/18  9:34 AM  Result Value Ref Range Status   Specimen Description   Final    URINE, RANDOM Performed at [facility], [address], [place], [place] [address]    Special Requests   Final    NONE Performed at [facility], [address], [place], [place] [address]    Culture   Final    NO GROWTH  Performed at [facility], [place] [address] [place], [place] [address]    Report Status 08/22/2018 FINAL  Final  MRSA PCR Screening     Status: None   Collection Time: 08/22/18  1:48 AM  Result Value Ref Range Status   MRSA by PCR NEGATIVE NEGATIVE Final    Comment:        The GeneXpert MRSA Assay (FDA approved for NASAL specimens only), is one component of a comprehensive MRSA colonization surveillance program. It is not intended to diagnose MRSA infection nor to guide or monitor treatment for MRSA infections. Performed at [facility], [address] [place], [place] [address]   Gastrointestinal Panel by PCR , Stool     Status: None   Collection Time: 08/23/18 12:56 PM  Result Value Ref Range Status    Campylobacter species NOT DETECTED NOT DETECTED Final   Plesimonas shigelloides NOT DETECTED NOT DETECTED Final   Salmonella species NOT DETECTED NOT DETECTED Final   Yersinia enterocolitica NOT DETECTED NOT DETECTED Final   Vibrio species NOT DETECTED NOT DETECTED Final   Vibrio cholerae NOT DETECTED NOT DETECTED Final   Enteroaggregative E coli (EAEC) NOT DETECTED NOT DETECTED Final   Enteropathogenic E coli (EPEC) NOT DETECTED NOT DETECTED Final   Enterotoxigenic E coli (ETEC) NOT DETECTED NOT DETECTED Final   Shiga like toxin producing E coli (STEC) NOT DETECTED NOT DETECTED Final   Shigella/Enteroinvasive E coli (EIEC) NOT DETECTED NOT DETECTED Final   Cryptosporidium NOT DETECTED NOT DETECTED Final   Cyclospora cayetanensis NOT DETECTED NOT DETECTED Final   Entamoeba histolytica NOT DETECTED NOT DETECTED Final   Giardia lamblia NOT DETECTED NOT DETECTED Final   Adenovirus F40/41 NOT DETECTED NOT DETECTED Final   Astrovirus NOT DETECTED NOT DETECTED Final   Norovirus GI/GII NOT DETECTED NOT DETECTED Final   Rotavirus A NOT DETECTED NOT DETECTED Final   Sapovirus (I, II, IV, and V) NOT DETECTED NOT DETECTED Final    Comment: Performed at [facility], [address] [place], [place] [address]    Coagulation Studies: No results for input(s): LABPROT, INR in the last 72 hours.  Urinalysis: No results for input(s): COLORURINE, LABSPEC, PHURINE, GLUCOSEU, HGBUR, BILIRUBINUR, KETONESUR, PROTEINUR, UROBILINOGEN, NITRITE, LEUKOCYTESUR in the last 72 hours.  Invalid input(s): APPERANCEUR    Imaging: No results found.   Medications:   . sodium chloride    . sodium chloride Stopped (08/30/18 0910)  . methylPREDNISolone (SOLU-MEDROL) injection Stopped (08/30/18 0912)   . amLODipine  10 mg Oral Daily  . Chlorhexidine Gluconate Cloth  6 each Topical Q0600  . feeding supplement (NEPRO CARB STEADY)  237 mL Oral BID BM  . heparin injection (subcutaneous)  5,000 Units  Subcutaneous Q8H  . metoprolol tartrate  100 mg Oral BID  . multivitamin  1 tablet Oral QHS  . nitroGLYCERIN  0.5 inch Topical Q6H  . [START ON 09/02/2018] riTUXimab (RITUXAN) IV infusion  375 mg/m2 Intravenous Once  . sulfamethoxazole-trimethoprim  20 mL Oral Daily   sodium chloride, acetaminophen **OR** acetaminophen, butalbital-acetaminophen-caffeine, hydrALAZINE, ondansetron **OR** ondansetron (ZOFRAN) IV, oxymetazoline, polyethylene glycol  Assessment/ Plan:  Jennifer Newman is a 53 y.o. white female with bipolar disorder, hypertension, depression, anxiety who was admitted to [facility] on 08/21/2018 for acute renal failure.  First hemodialysis treatment was on 2/20. Right femoral temp HD catheter. Permcath placed on 2/24 Dr. 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  Received IV cyclophosphamide 7.5mg /kg: 820mg  on 2/28 Scheduled for rituximab infusion on Monday. 375 mg/m: 800mg  IV Patient may need plasmapheresis and may need to be transferred to a tertiary care center.   1. Acute renal failure: RPGN - requiring hemodialysis 2. Hyperkalemia 3. Proteinuria: nephrotic range 6.4 grams on 2/20 4. Hematuria 5. Hypertension 6. Anemia of renal failure  Plan:  Rapid progressive glomerulonephritis. C- ANCA elevated on 2/20. Requiring hemodialysis Seen and examined on hemodialysis treatment - Not currently on steroids - IV cyclophosphamide on 2/28.  - IV rituximab scheduled 375mg /m - May still need plasmapheresis - Pending final biopsy report.  - EPO with HD treatment   LOS: [place] 2/29/202011:31 AM

## 2018-08-31 NOTE — Progress Notes (Signed)
Post HD assessment. Pt tolerated tx well without c/o or complication. Net UF 1509, goal met.    08/31/18 1249  Vital Signs  Temp 97.6 F (36.4 C)  Temp Source Oral  Pulse Rate 62  Pulse Rate Source Monitor  Resp 20  BP (!) 144/80  BP Location Right Arm  BP Method Automatic  Patient Position (if appropriate) Lying  Oxygen Therapy  SpO2 94 %  O2 Device Room Air  Dialysis Weight  Weight 108 kg  Type of Weight Post-Dialysis  Post-Hemodialysis Assessment  Rinseback Volume (mL) 250 mL  KECN 78.9 V  Dialyzer Clearance Lightly streaked  Duration of HD Treatment -hour(s) 3.5 hour(s)  Hemodialysis Intake (mL) 500 mL  UF Total -Machine (mL) 2009 mL  Net UF (mL) 1509 mL  Tolerated HD Treatment Yes  Education / Care Plan  Dialysis Education Provided Yes  Documented Education in Care Plan Yes  Hemodialysis Catheter Right Subclavian Double-lumen;Permanent  Placement Date/Time: 08/26/18 1300   Orientation: Right  Access Location: Subclavian  Hemodialysis Catheter Type: Double-lumen;Permanent  Site Condition No complications  Blue Lumen Status Heparin locked  Red Lumen Status Heparin locked  Purple Lumen Status N/A  Catheter fill solution Heparin 1000 units/ml  Catheter fill volume (Arterial) 1.5 cc  Catheter fill volume (Venous) 1.5  Dressing Type Biopatch  Dressing Status Clean;Dry;Intact  Drainage Description None  Post treatment catheter status Capped and Clamped

## 2018-08-31 NOTE — Progress Notes (Signed)
Pre HD assessment    08/31/18 0902  Vital Signs  Temp 97.6 F (36.4 C)  Temp Source Oral  Pulse Rate (!) 57  Pulse Rate Source Monitor  Resp 20  BP (!) 157/87  BP Location Right Arm  BP Method Automatic  Patient Position (if appropriate) Lying  Oxygen Therapy  SpO2 97 %  O2 Device Room Air  Pain Assessment  Pain Scale 0-10  Pain Score 0  Dialysis Weight  Weight 108.9 kg  Type of Weight Pre-Dialysis  Time-Out for Hemodialysis  What Procedure? HD  Pt Identifiers(min of two) First/Last Name;MRN/Account#  Correct Site? Yes  Correct Side? Yes  Correct Procedure? Yes  Consents Verified? Yes  Rad Studies Available? N/A  Safety Precautions Reviewed? Yes  Engineer, civil (consulting) Number  (4A)  Station Number 4  UF/Alarm Test Passed  Conductivity: Meter 13.6  Conductivity: Machine  13.9  pH 7.2  Reverse Osmosis main  Normal Saline Lot Number [id number]  Dialyzer Lot Number [id number]  Disposable Set Lot Number [id number]  Machine Temperature 98.6 F (37 C)  Musician and Audible Yes  Blood Lines Intact and Secured Yes  Pre Treatment Patient Checks  Vascular access used during treatment Catheter  Hepatitis B Surface Antigen Results Negative  Date Hepatitis B Surface Antigen Drawn 08/21/18  Hepatitis B Surface Antibody  (<10)  Date Hepatitis B Surface Antibody Drawn 08/21/18  Hemodialysis Consent Verified Yes  Hemodialysis Standing Orders Initiated Yes  ECG (Telemetry) Monitor On Yes  Prime Ordered Normal Saline  Length of  DialysisTreatment -hour(s) 3.5 Hour(s)  Dialyzer Elisio 17H NR  Dialysate 2K, 2.5 Ca  Dialysis Anticoagulant None  Dialysate Flow Ordered 800  Blood Flow Rate Ordered 400 mL/min  Ultrafiltration Goal 1.5 Liters  Pre Treatment Labs Phosphorus  Dialysis Blood Pressure Support Ordered Normal Saline  Education / Care Plan  Dialysis Education Provided Yes  Documented Education in Care Plan Yes  Hemodialysis Catheter Right Subclavian  Double-lumen;Permanent  Placement Date/Time: 08/26/18 1300   Orientation: Right  Access Location: Subclavian  Hemodialysis Catheter Type: Double-lumen;Permanent  Site Condition No complications  Dressing Type Biopatch  Dressing Status Clean;Dry;Intact  Drainage Description None

## 2018-08-31 NOTE — Progress Notes (Signed)
[facility] [place] NAME: Jennifer Newman    MR#:  [id number]  DATE OF BIRTH:  1965-09-13  SUBJECTIVE:  wet for HD today Doing well this am tolerated HD No issues   REVIEW OF SYSTEMS:    ROS  CONSTITUTIONAL: No documented fever. No fatigue, weakness. No weight gain, no weight loss.  EYES: No blurry or double vision.  ENT: No tinnitus. No postnasal drip. No redness of the oropharynx.  Had some nasal bleed RESPIRATORY: No cough, no wheeze, no hemoptysis. No dyspnea.  CARDIOVASCULAR: No chest pain. No orthopnea. No palpitations. No syncope.  GASTROINTESTINAL: No nausea, no vomiting or diarrhea. No abdominal pain. No melena or hematochezia.  GENITOURINARY: No dysuria or hematuria.  ENDOCRINE: No polyuria or nocturia. No heat or cold intolerance.  HEMATOLOGY: No anemia. No bruising. No bleeding.  INTEGUMENTARY: No rashes. No lesions.  MUSCULOSKELETAL: No arthritis. No swelling. No gout.  NEUROLOGIC: No numbness, tingling, or ataxia. No seizure-type activity.  PSYCHIATRIC: No anxiety. No insomnia. No ADD.   DRUG ALLERGIES:  No Known Allergies  VITALS:  Blood pressure (!) 144/80, pulse (!) 59, temperature 97.6 F (36.4 C), temperature source Oral, resp. rate 19, height 5\' 3"  (1.6 m), weight 108 kg, last menstrual period 04/09/2018, SpO2 94 %.  PHYSICAL EXAMINATION:   Physical Exam  GENERAL:  53 y.o.-year-old patient lying in the bed with no acute distress.  EYES: Pupils equal, round, reactive to light and accommodation. No scleral icterus. Extraocular muscles intact.  HEENT: Head atraumatic, normocephalic. Oropharynx and nasopharynx clear.  NECK:  Supple, no jugular venous distention. No thyroid enlargement, no tenderness.  LUNGS: Normal breath sounds bilaterally, no wheezing, rales, rhonchi. No use of accessory muscles of respiration.  CARDIOVASCULAR: S1, S2 normal. No murmurs, rubs, or gallops.  ABDOMEN: Soft, nontender,  nondistended. Bowel sounds present. No organomegaly or mass.  EXTREMITIES: No cyanosis, clubbing or edema b/l.    NEUROLOGIC: Cranial nerves II through XII are intact. No focal Motor or sensory deficits b/l.   PSYCHIATRIC: The patient is alert and oriented x 3.  SKIN: No obvious rash, lesion, or ulcer.    RIJ permcath  LABORATORY PANEL:   CBC Recent Labs  Lab 08/31/18 0429  WBC 16.5*  HGB 8.8*  HCT 27.4*  PLT 301   ------------------------------------------------------------------------------------------------------------------ Chemistries  Recent Labs  Lab 08/31/18 0429  NA 138  K 5.4*  CL 97*  CO2 25  GLUCOSE 163*  BUN 88*  CREATININE 7.36*  CALCIUM 8.4*   ------------------------------------------------------------------------------------------------------------------  Cardiac Enzymes No results for input(s): TROPONINI in the last 168 hours. ------------------------------------------------------------------------------------------------------------------  RADIOLOGY:  No results found.   ASSESSMENT AND PLAN:   53 year old female patient with history of hypertension, bipolar disorder, OCD, tachycardia, vertigo currently under hospitalist service for acute renal failure.  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  Received IV cyclophosphamide 7.5mg /kg: 820mg  on 2/28 Scheduled for rituximab infusion on Monday. 375 mg/m: 800mg  IVIV Solu-Medrol on board Dialysis schedule as per nephrology  -Hyperkalemia Potassium normalized Monitor  -Hyperphosphatemia improved Continue monitoring  -Proteinuria, hematuria and pyuria based on urinalysis Appreciate nephrology evaluation Await renal biopsy results  -Nasal bleed Resolved ENT consult done Clinical monitoring No packing  recommended  -Obesity Weight loss counseling given  -Diarrhea self-limiting  -Hypertension Continue oral metoprolol for control of blood pressure  -DVT prophylaxis  Subcu heparin to continue  All the records are reviewed and case discussed with Care Management/Social Worker. Management plans discussed with the patient, family and they are in agreement.  CODE STATUS: Full code  DVT Prophylaxis: SCDs  TOTAL TIME TAKING CARE OF THIS PATIENT: 36 minutes.   POSSIBLE D/C IN 2 to 3 DAYS, DEPENDING ON CLINICAL CONDITION.  Khyler Urda M.D on 08/31/2018 at 3:11 PM  Between 7am to 6pm - Pager - [phone]  After 6pm go to [email or web address] - password [email or web address] [facility]  Office  [phone]  CC: Primary care physician; Barbaraann Boys, MD  Note: This dictation was prepared with Dragon dictation along with smaller phrase technology. Any transcriptional errors that result from this process are unintentional.

## 2018-08-31 NOTE — Progress Notes (Signed)
HD tx end    08/31/18 1244  Vital Signs  Pulse Rate (!) 59  Pulse Rate Source Monitor  Resp (!) 21  BP (!) 153/80  BP Location Right Arm  BP Method Automatic  Patient Position (if appropriate) Lying  Oxygen Therapy  SpO2 97 %  O2 Device Room Air  During Hemodialysis Assessment  Dialysis Fluid Bolus Normal Saline  Bolus Amount (mL) 250 mL  Intra-Hemodialysis Comments Tx completed

## 2018-09-01 NOTE — Progress Notes (Signed)
[facility]  ROUNDING NOTE   Subjective:   Hemodialysis treatment yesterday. Tolerated treatment well. UF of 1.5 liters.   Objective:  Vital signs in last 24 hours:  Temp:  [97.8 F (36.6 C)-98.4 F (36.9 C)] 98.4 F (36.9 C) (03/01 0513) Pulse Rate:  [58-64] 64 (03/01 0513) Resp:  [17-19] 17 (03/01 0513) BP: (127-139)/(67-84) 139/84 (03/01 0513) SpO2:  [94 %-96 %] 94 % (03/01 0513) Weight:  [106.9 kg] 106.9 kg (03/01 0513)  Weight change: 0.4 kg Filed Weights   08/31/18 0902 08/31/18 1249 09/01/18 0513  Weight: 108.9 kg 108 kg 106.9 kg    Intake/Output: I/O last 3 completed shifts: In: 320 [P.O.:320] Out: 1509 [Other:1509]   Intake/Output this shift:  Total I/O In: 240 [P.O.:240] Out: -   Physical Exam: General: NAD,   Head: Normocephalic, atraumatic. Moist oral mucosal membranes  Eyes: Anicteric, PERRL  Neck: Supple, trachea midline  Lungs:  Clear to auscultation  Heart: Regular rate and rhythm  Abdomen:  Soft, nontender, obese  Extremities:  1+ peripheral edema.  Neurologic: Nonfocal, moving all four extremities  Skin: No lesions  Access: RIJ permcath    Basic Metabolic Panel: Recent Labs  Lab 08/26/18 0433 08/27/18 0324 08/28/18 0627 08/29/18 1039 08/31/18 0429  NA 138 137 139  --  138  K 4.0 4.4 4.7  --  5.4*  CL 98 99 98  --  97*  CO2 26 25 29   --  25  GLUCOSE 90 97 115*  --  163*  BUN 60* 75* 49*  --  88*  CREATININE 8.59* 10.20* 7.14*  --  7.36*  CALCIUM 8.0* 8.1* 8.1*  --  8.4*  PHOS  --   --   --  5.7*  --     Liver Function Tests: No results for input(s): AST, ALT, ALKPHOS, BILITOT, PROT, ALBUMIN in the last 168 hours. No results for input(s): LIPASE, AMYLASE in the last 168 hours. No results for input(s): AMMONIA in the last 168 hours.  CBC: Recent Labs  Lab 08/27/18 0324 08/28/18 1745 08/31/18 0429  WBC 11.3*  --  16.5*  HGB 9.7* 9.8* 8.8*  HCT 29.3* 30.7* 27.4*  MCV 88.0  --  90.1  PLT 272  --  301     Cardiac Enzymes: No results for input(s): CKTOTAL, CKMB, CKMBINDEX, TROPONINI in the last 168 hours.  BNP: Invalid input(s): POCBNP  CBG: No results for input(s): GLUCAP in the last 168 hours.  Microbiology: Results for orders placed or performed during the hospital encounter of 08/21/18  Urine Culture     Status: None   Collection Time: 08/21/18  9:34 AM  Result Value Ref Range Status   Specimen Description   Final    URINE, RANDOM Performed at [facility], [address], [place], [place] [address]    Special Requests   Final    NONE Performed at [facility], [address] [place], [place] [address]    Culture   Final    NO GROWTH Performed at [facility], [place] [address] [place], [place] [address]    Report Status 08/22/2018 FINAL  Final  MRSA PCR Screening     Status: None   Collection Time: 08/22/18  1:48 AM  Result Value Ref Range Status   MRSA by PCR NEGATIVE NEGATIVE Final    Comment:        The GeneXpert MRSA Assay (FDA approved for NASAL specimens only), is one component of  a comprehensive MRSA colonization surveillance program. It is not intended to diagnose MRSA infection nor to guide or monitor treatment for MRSA infections. Performed at [facility], [address] [place], [place] [address]   Gastrointestinal Panel by PCR , Stool     Status: None   Collection Time: 08/23/18 12:56 PM  Result Value Ref Range Status   Campylobacter species NOT DETECTED NOT DETECTED Final   Plesimonas shigelloides NOT DETECTED NOT DETECTED Final   Salmonella species NOT DETECTED NOT DETECTED Final   Yersinia enterocolitica NOT DETECTED NOT DETECTED Final   Vibrio species NOT DETECTED NOT DETECTED Final   Vibrio cholerae NOT DETECTED NOT DETECTED Final   Enteroaggregative E coli (EAEC) NOT DETECTED NOT DETECTED Final   Enteropathogenic E coli (EPEC) NOT DETECTED NOT DETECTED Final   Enterotoxigenic E coli (ETEC) NOT  DETECTED NOT DETECTED Final   Shiga like toxin producing E coli (STEC) NOT DETECTED NOT DETECTED Final   Shigella/Enteroinvasive E coli (EIEC) NOT DETECTED NOT DETECTED Final   Cryptosporidium NOT DETECTED NOT DETECTED Final   Cyclospora cayetanensis NOT DETECTED NOT DETECTED Final   Entamoeba histolytica NOT DETECTED NOT DETECTED Final   Giardia lamblia NOT DETECTED NOT DETECTED Final   Adenovirus F40/41 NOT DETECTED NOT DETECTED Final   Astrovirus NOT DETECTED NOT DETECTED Final   Norovirus GI/GII NOT DETECTED NOT DETECTED Final   Rotavirus A NOT DETECTED NOT DETECTED Final   Sapovirus (I, II, IV, and V) NOT DETECTED NOT DETECTED Final    Comment: Performed at [facility], [address] [place], [place] [address]    Coagulation Studies: No results for input(s): LABPROT, INR in the last 72 hours.  Urinalysis: No results for input(s): COLORURINE, LABSPEC, PHURINE, GLUCOSEU, HGBUR, BILIRUBINUR, KETONESUR, PROTEINUR, UROBILINOGEN, NITRITE, LEUKOCYTESUR in the last 72 hours.  Invalid input(s): APPERANCEUR    Imaging: No results found.   Medications:   . sodium chloride    . sodium chloride Stopped (08/30/18 0910)  . methylPREDNISolone (SOLU-MEDROL) injection 1,000 mg (09/01/18 1028)   . amLODipine  10 mg Oral Daily  . Chlorhexidine Gluconate Cloth  6 each Topical Q0600  . epoetin (EPOGEN/PROCRIT) injection  10,000 Units Intravenous Q T,Th,Sa-HD  . feeding supplement (NEPRO CARB STEADY)  237 mL Oral BID BM  . heparin injection (subcutaneous)  5,000 Units Subcutaneous Q8H  . metoprolol tartrate  100 mg Oral BID  . multivitamin  1 tablet Oral QHS  . nitroGLYCERIN  0.5 inch Topical Q6H  . [START ON 09/02/2018] riTUXimab (RITUXAN) IV infusion  375 mg/m2 Intravenous Once  . sulfamethoxazole-trimethoprim  20 mL Oral Daily   sodium chloride, acetaminophen **OR** acetaminophen, butalbital-acetaminophen-caffeine, hydrALAZINE, ondansetron **OR** ondansetron (ZOFRAN) IV,  oxymetazoline, polyethylene glycol  Assessment/ Plan:  Jennifer Newman is a 53 y.o. white female with bipolar disorder, hypertension, depression, anxiety who was admitted to [facility] on 08/21/2018 for acute renal failure.  First hemodialysis treatment was on 2/20. Right femoral temp HD catheter. Permcath placed on 2/24 Dr. 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  Received IV cyclophosphamide 7.5mg /kg: 820mg  on 2/28 Scheduled for rituximab infusion on Monday. 375 mg/m: 800mg  IV Patient may need plasmapheresis and may need to be transferred to a tertiary care center.   1. Acute renal failure: RPGN - requiring hemodialysis 2. Hyperkalemia 3. Proteinuria: nephrotic range 6.4 grams on 2/20 4. Hematuria 5. Hypertension 6. Anemia of renal failure  Plan:  Rapid progressive  glomerulonephritis. C- ANCA elevated on 2/20. Requiring hemodialysis Seen and examined on hemodialysis treatment - IV steroids - IV cyclophosphamide on 2/28.  - IV rituximab scheduled 375mg /m2 - May still need plasmapheresis pending final biopsy report.  - EPO with HD treatment   LOS: [place] 3/1/20203:06 PM

## 2018-09-01 NOTE — Progress Notes (Signed)
[facility] [place] NAME: Cecil Bixby    MR#:  [id number]  DATE OF BIRTH:  Oct 12, 1965  SUBJECTIVE:  Has some nausea but denies any other complaints. REVIEW OF SYSTEMS:    ROS  CONSTITUTIONAL: No documented fever. No fatigue, weakness. No weight gain, no weight loss.  EYES: No blurry or double vision.  ENT: No tinnitus. No postnasal drip. No redness of the oropharynx.  Had some nasal bleed RESPIRATORY: No cough, no wheeze, no hemoptysis. No dyspnea.  CARDIOVASCULAR: No chest pain. No orthopnea. No palpitations. No syncope.  GASTROINTESTINAL: No nausea, no vomiting or diarrhea. No abdominal pain. No melena or hematochezia.  GENITOURINARY: No dysuria or hematuria.  ENDOCRINE: No polyuria or nocturia. No heat or cold intolerance.  HEMATOLOGY: No anemia. No bruising. No bleeding.  INTEGUMENTARY: No rashes. No lesions.  MUSCULOSKELETAL: No arthritis. No swelling. No gout.  NEUROLOGIC: No numbness, tingling, or ataxia. No seizure-type activity.  PSYCHIATRIC: No anxiety. No insomnia. No ADD.   DRUG ALLERGIES:  No Known Allergies  VITALS:  Blood pressure 139/84, pulse 64, temperature 98.4 F (36.9 C), resp. rate 17, height 5\' 3"  (1.6 m), weight 106.9 kg, last menstrual period 04/09/2018, SpO2 94 %.  PHYSICAL EXAMINATION:   Physical Exam  GENERAL:  53 y.o.-year-old patient lying in the bed with no acute distress.  EYES: Pupils equal, round, reactive to light and accommodation. No scleral icterus. Extraocular muscles intact.  HEENT: Head atraumatic, normocephalic. Oropharynx and nasopharynx clear.  NECK:  Supple, no jugular venous distention. No thyroid enlargement, no tenderness.  LUNGS: Normal breath sounds bilaterally, no wheezing, rales, rhonchi. No use of accessory muscles of respiration.  CARDIOVASCULAR: S1, S2 normal. No murmurs, rubs, or gallops.  ABDOMEN: Soft, nontender, nondistended. Bowel sounds present. No organomegaly or  mass.  EXTREMITIES: No cyanosis, clubbing or edema b/l.    NEUROLOGIC: Cranial nerves II through XII are intact. No focal Motor or sensory deficits b/l.   PSYCHIATRIC: The patient is alert and oriented x 3.  SKIN: No obvious rash, lesion, or ulcer.    RIJ permcath  LABORATORY PANEL:   CBC Recent Labs  Lab 08/31/18 0429  WBC 16.5*  HGB 8.8*  HCT 27.4*  PLT 301   ------------------------------------------------------------------------------------------------------------------ Chemistries  Recent Labs  Lab 08/31/18 0429  NA 138  K 5.4*  CL 97*  CO2 25  GLUCOSE 163*  BUN 88*  CREATININE 7.36*  CALCIUM 8.4*   ------------------------------------------------------------------------------------------------------------------  Cardiac Enzymes No results for input(s): TROPONINI in the last 168 hours. ------------------------------------------------------------------------------------------------------------------  RADIOLOGY:  No results found.   ASSESSMENT AND PLAN:   53 year old female patient with history of hypertension, bipolar disorder, OCD, tachycardia, vertigo currently under hospitalist service for acute renal failure.  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  Received IV cyclophosphamide 7.5mg /kg: 820mg  on 2/28 Scheduled for rituximab infusion on Monday. 375 mg/m: 800mg  IVIV Solu-Medrol on board Dialysis schedule as per nephrology, waiting for full biopsy results to see if she needs to go to tertiary center for plasmapheresis.  Nephrology will let me know.  Spoke with Dr. Juleen China yesterday.  -Hyperkalemia Recheck labs tomorrow.  -Hyperphosphatemia improved Continue monitoring  -Proteinuria, hematuria and pyuria based on urinalysis Appreciate nephrology  evaluation Await renal biopsy results  -Nasal bleed Resolved ENT consult done Clinical monitoring No packing recommended  -Obesity Weight loss  counseling given  -Diarrhea self-limiting  -Hypertension Continue oral metoprolol for control of blood pressure  -DVT prophylaxis Subcu heparin to continue  All the records are reviewed and case discussed with Care Management/Social Worker. Management plans discussed with the patient, family and they are in agreement.  CODE STATUS: Full code  DVT Prophylaxis: SCDs  TOTAL TIME TAKING CARE OF THIS PATIENT: 36 minutes.   POSSIBLE D/C IN 2 to 3 DAYS, DEPENDING ON CLINICAL CONDITION.  Epifanio Lesches M.D on 09/01/2018 at 12:51 PM  Between 7am to 6pm - Pager - [phone]  After 6pm go to [email or web address] - password [email or web address] [facility]  Office  [phone]  CC: Primary care physician; Barbaraann Boys, MD  Note: This dictation was prepared with Dragon dictation along with smaller phrase technology. Any transcriptional errors that result from this process are unintentional.

## 2018-09-02 ENCOUNTER — Encounter: Payer: Self-pay | Admitting: Nephrology

## 2018-09-02 LAB — CBC
HCT: 25.8 % — ABNORMAL LOW (ref 36.0–46.0)
Hemoglobin: 8.4 g/dL — ABNORMAL LOW (ref 12.0–15.0)
MCH: 28.8 pg (ref 26.0–34.0)
MCHC: 32.6 g/dL (ref 30.0–36.0)
MCV: 88.4 fL (ref 80.0–100.0)
Platelets: 282 10*3/uL (ref 150–400)
RBC: 2.92 MIL/uL — ABNORMAL LOW (ref 3.87–5.11)
RDW: 14.1 % (ref 11.5–15.5)
WBC: 8.7 10*3/uL (ref 4.0–10.5)
nRBC: 0 % (ref 0.0–0.2)

## 2018-09-02 LAB — BASIC METABOLIC PANEL
Anion gap: 15 (ref 5–15)
BUN: 94 mg/dL — ABNORMAL HIGH (ref 6–20)
CO2: 22 mmol/L (ref 22–32)
Calcium: 8.2 mg/dL — ABNORMAL LOW (ref 8.9–10.3)
Chloride: 98 mmol/L (ref 98–111)
Creatinine, Ser: 6.59 mg/dL — ABNORMAL HIGH (ref 0.44–1.00)
GFR calc Af Amer: 8 mL/min — ABNORMAL LOW (ref >60)
GFR calc non Af Amer: 7 mL/min — ABNORMAL LOW (ref >60)
Glucose, Bld: 137 mg/dL — ABNORMAL HIGH (ref 70–99)
Potassium: 5.5 mmol/L — ABNORMAL HIGH (ref 3.5–5.1)
Sodium: 135 mmol/L (ref 135–145)

## 2018-09-02 LAB — SURGICAL PATHOLOGY

## 2018-09-02 MED ORDER — METOPROLOL TARTRATE 50 MG PO TABS
50.0000 mg | ORAL_TABLET | Freq: Two times a day (BID) | ORAL | Status: DC
  Administered 2018-09-02 – 2018-09-03 (×2): 50 mg via ORAL
  Filled 2018-09-02 (×2): qty 1

## 2018-09-02 NOTE — Progress Notes (Addendum)
[facility] [place] NAME: Therisa Mennella    MR#:  [id number]  DATE OF BIRTH:  08-Aug-1965  SUBJECTIVE:  Patient denies any complaints. REVIEW OF SYSTEMS:    ROS  CONSTITUTIONAL: No documented fever. No fatigue, weakness. No weight gain, no weight loss.  EYES: No blurry or double vision.  ENT: No tinnitus. No postnasal drip. No redness of the oropharynx.  Had some nasal bleed RESPIRATORY: No cough, no wheeze, no hemoptysis. No dyspnea.  CARDIOVASCULAR: No chest pain. No orthopnea. No palpitations. No syncope.  GASTROINTESTINAL: No nausea, no vomiting or diarrhea. No abdominal pain. No melena or hematochezia.  GENITOURINARY: No dysuria or hematuria.  ENDOCRINE: No polyuria or nocturia. No heat or cold intolerance.  HEMATOLOGY: No anemia. No bruising. No bleeding.  INTEGUMENTARY: No rashes. No lesions.  MUSCULOSKELETAL: No arthritis. No swelling. No gout.  NEUROLOGIC: No numbness, tingling, or ataxia. No seizure-type activity.  PSYCHIATRIC: No anxiety. No insomnia. No ADD.   DRUG ALLERGIES:  No Known Allergies  VITALS:  Blood pressure (!) 142/89, pulse 62, temperature (!) 97.3 F (36.3 C), temperature source Oral, resp. rate 20, height 5\' 3"  (1.6 m), weight 108.3 kg, last menstrual period 04/09/2018, SpO2 95 %.  PHYSICAL EXAMINATION:   Physical Exam  GENERAL:  53 y.o.-year-old patient lying in the bed with no acute distress.  EYES: Pupils equal, round, reactive to light and accommodation. No scleral icterus. Extraocular muscles intact.  HEENT: Head atraumatic, normocephalic. Oropharynx and nasopharynx clear.  NECK:  Supple, no jugular venous distention. No thyroid enlargement, no tenderness.  LUNGS: Normal breath sounds bilaterally, no wheezing, rales, rhonchi. No use of accessory muscles of respiration.  CARDIOVASCULAR: S1, S2 normal. No murmurs, rubs, or gallops.  ABDOMEN: Soft, nontender, nondistended. Bowel sounds present. No  organomegaly or mass.  EXTREMITIES: No cyanosis, clubbing or edema b/l.    NEUROLOGIC: Cranial nerves II through XII are intact. No focal Motor or sensory deficits b/l.   PSYCHIATRIC: The patient is alert and oriented x 3.  SKIN: No obvious rash, lesion, or ulcer.    RIJ permcath  LABORATORY PANEL:   CBC Recent Labs  Lab 09/02/18 0440  WBC 8.7  HGB 8.4*  HCT 25.8*  PLT 282   ------------------------------------------------------------------------------------------------------------------ Chemistries  Recent Labs  Lab 09/02/18 0440  NA 135  K 5.5*  CL 98  CO2 22  GLUCOSE 137*  BUN 94*  CREATININE 6.59*  CALCIUM 8.2*   ------------------------------------------------------------------------------------------------------------------  Cardiac Enzymes No results for input(s): TROPONINI in the last 168 hours. ------------------------------------------------------------------------------------------------------------------  RADIOLOGY:  No results found.   ASSESSMENT AND PLAN:   53 year old female patient with history of hypertension, bipolar disorder, OCD, tachycardia, vertigo currently under hospitalist service for acute renal failure.  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  Received IV cyclophosphamide 7.5mg /kg: 820mg  on 2/28 Scheduled for rituximab infusion today .dialysis schedule as per nephrology,   -Hyperkalemia Recheck labs tomorrow.  -Hyperphosphatemia improved Continue monitoring  -Proteinuria, hematuria and pyuria based on urinalysis Appreciate nephrology evaluation Await renal biopsy results  -Nasal bleed Resolved ENT consult done Clinical monitoring No packing recommended  -Obesity Weight loss counseling given  -Diarrhea  self-limiting  -Hypertension, slight bradycardia, adjusted dose of metoprolol.  Continue Norvasc, as needed hydralazine IV. -DVT prophylaxis Subcu heparin to continue  All the records are reviewed and case  discussed with Care Management/Social Worker. Management plans discussed with the patient, family and they are in agreement.  CODE STATUS: Full code  DVT Prophylaxis: SCDs  TOTAL TIME TAKING CARE OF THIS PATIENT: 36 minutes.   POSSIBLE D/C IN 2 to 3 DAYS, DEPENDING ON CLINICAL CONDITION.  Epifanio Lesches M.D on 09/02/2018 at 1:36 PM  Between 7am to 6pm - Pager - [phone]  After 6pm go to [email or web address] - password [email or web address] [facility]  Office  [phone]  CC: Primary care physician; Barbaraann Boys, MD  Note: This dictation was prepared with Dragon dictation along with smaller phrase technology. Any transcriptional errors that result from this process are unintentional.

## 2018-09-02 NOTE — Progress Notes (Signed)
Pt received rituxin 1st dose today.  tol well no s/s reaction.  vss.

## 2018-09-02 NOTE — Progress Notes (Signed)
[facility]  ROUNDING NOTE   Subjective:   Rituximab infusion today.    Objective:  Vital signs in last 24 hours:  Temp:  [97.3 F (36.3 C)-97.9 F (36.6 C)] 97.5 F (36.4 C) (03/02 1404) Pulse Rate:  [58-71] 63 (03/02 1404) Resp:  [16-22] 22 (03/02 1404) BP: (129-161)/(78-96) 129/80 (03/02 1404) SpO2:  [92 %-95 %] 95 % (03/02 1404) Weight:  [108.3 kg] 108.3 kg (03/02 0449)  Weight change: -0.6 kg Filed Weights   08/31/18 1249 09/01/18 0513 09/02/18 0449  Weight: 108 kg 106.9 kg 108.3 kg    Intake/Output: I/O last 3 completed shifts: In: 240 [P.O.:240] Out: -    Intake/Output this shift:  Total I/O In: 480 [P.O.:480] Out: -   Physical Exam: General: NAD,   Head: Normocephalic, atraumatic. Moist oral mucosal membranes  Eyes: Anicteric, PERRL  Neck: Supple, trachea midline  Lungs:  Clear to auscultation  Heart: Regular rate and rhythm  Abdomen:  Soft, nontender, obese  Extremities:  1+ peripheral edema.  Neurologic: Nonfocal, moving all four extremities  Skin: No lesions  Access: RIJ permcath    Basic Metabolic Panel: Recent Labs  Lab 08/27/18 0324 08/28/18 0627 08/29/18 1039 08/31/18 0429 09/02/18 0440  NA 137 139  --  138 135  K 4.4 4.7  --  5.4* 5.5*  CL 99 98  --  97* 98  CO2 25 29  --  25 22  GLUCOSE 97 115*  --  163* 137*  BUN 75* 49*  --  88* 94*  CREATININE 10.20* 7.14*  --  7.36* 6.59*  CALCIUM 8.1* 8.1*  --  8.4* 8.2*  PHOS  --   --  5.7*  --   --     Liver Function Tests: No results for input(s): AST, ALT, ALKPHOS, BILITOT, PROT, ALBUMIN in the last 168 hours. No results for input(s): LIPASE, AMYLASE in the last 168 hours. No results for input(s): AMMONIA in the last 168 hours.  CBC: Recent Labs  Lab 08/27/18 0324 08/28/18 1745 08/31/18 0429 09/02/18 0440  WBC 11.3*  --  16.5* 8.7  HGB 9.7* 9.8* 8.8* 8.4*  HCT 29.3* 30.7* 27.4* 25.8*  MCV 88.0  --  90.1 88.4  PLT 272  --  301 282    Cardiac Enzymes: No  results for input(s): CKTOTAL, CKMB, CKMBINDEX, TROPONINI in the last 168 hours.  BNP: Invalid input(s): POCBNP  CBG: No results for input(s): GLUCAP in the last 168 hours.  Microbiology: Results for orders placed or performed during the hospital encounter of 08/21/18  Urine Culture     Status: None   Collection Time: 08/21/18  9:34 AM  Result Value Ref Range Status   Specimen Description   Final    URINE, RANDOM Performed at [facility], [address], [place], [place] [address]    Special Requests   Final    NONE Performed at [facility], [address] [place], [place] [address]    Culture   Final    NO GROWTH Performed at [facility], [place] [address], [place], [place] [address]    Report Status 08/22/2018 FINAL  Final  MRSA PCR Screening     Status: None   Collection Time: 08/22/18  1:48 AM  Result Value Ref Range Status   MRSA by PCR NEGATIVE NEGATIVE Final    Comment:        The GeneXpert MRSA Assay (FDA approved for NASAL specimens only), is one component  of a comprehensive MRSA colonization surveillance program. It is not intended to diagnose MRSA infection nor to guide or monitor treatment for MRSA infections. Performed at [facility], [address] [place], [place] [address]   Gastrointestinal Panel by PCR , Stool     Status: None   Collection Time: 08/23/18 12:56 PM  Result Value Ref Range Status   Campylobacter species NOT DETECTED NOT DETECTED Final   Plesimonas shigelloides NOT DETECTED NOT DETECTED Final   Salmonella species NOT DETECTED NOT DETECTED Final   Yersinia enterocolitica NOT DETECTED NOT DETECTED Final   Vibrio species NOT DETECTED NOT DETECTED Final   Vibrio cholerae NOT DETECTED NOT DETECTED Final   Enteroaggregative E coli (EAEC) NOT DETECTED NOT DETECTED Final   Enteropathogenic E coli (EPEC) NOT DETECTED NOT DETECTED Final   Enterotoxigenic E coli (ETEC) NOT DETECTED NOT DETECTED Final    Shiga like toxin producing E coli (STEC) NOT DETECTED NOT DETECTED Final   Shigella/Enteroinvasive E coli (EIEC) NOT DETECTED NOT DETECTED Final   Cryptosporidium NOT DETECTED NOT DETECTED Final   Cyclospora cayetanensis NOT DETECTED NOT DETECTED Final   Entamoeba histolytica NOT DETECTED NOT DETECTED Final   Giardia lamblia NOT DETECTED NOT DETECTED Final   Adenovirus F40/41 NOT DETECTED NOT DETECTED Final   Astrovirus NOT DETECTED NOT DETECTED Final   Norovirus GI/GII NOT DETECTED NOT DETECTED Final   Rotavirus A NOT DETECTED NOT DETECTED Final   Sapovirus (I, II, IV, and V) NOT DETECTED NOT DETECTED Final    Comment: Performed at [facility], [address] [place], [place] [address]    Coagulation Studies: No results for input(s): LABPROT, INR in the last 72 hours.  Urinalysis: No results for input(s): COLORURINE, LABSPEC, PHURINE, GLUCOSEU, HGBUR, BILIRUBINUR, KETONESUR, PROTEINUR, UROBILINOGEN, NITRITE, LEUKOCYTESUR in the last 72 hours.  Invalid input(s): APPERANCEUR    Imaging: No results found.   Medications:   . sodium chloride    . sodium chloride Stopped (08/30/18 0910)  . methylPREDNISolone (SOLU-MEDROL) injection 1,000 mg (09/02/18 1113)   . amLODipine  10 mg Oral Daily  . Chlorhexidine Gluconate Cloth  6 each Topical Q0600  . epoetin (EPOGEN/PROCRIT) injection  10,000 Units Intravenous Q T,Th,Sa-HD  . feeding supplement (NEPRO CARB STEADY)  237 mL Oral BID BM  . heparin injection (subcutaneous)  5,000 Units Subcutaneous Q8H  . metoprolol tartrate  50 mg Oral BID  . multivitamin  1 tablet Oral QHS  . nitroGLYCERIN  0.5 inch Topical Q6H  . sulfamethoxazole-trimethoprim  20 mL Oral Daily   sodium chloride, acetaminophen **OR** acetaminophen, butalbital-acetaminophen-caffeine, hydrALAZINE, ondansetron **OR** ondansetron (ZOFRAN) IV, oxymetazoline, polyethylene glycol  Assessment/ Plan:  [name] is a 53 y.o. white female with bipolar  disorder, hypertension, depression, anxiety who was admitted to [facility] on 08/21/2018 for acute renal failure.  First hemodialysis treatment was on 2/20. Right femoral temp HD catheter. Permcath placed on 2/24 Dr. 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  Received IV cyclophosphamide 7.5mg /kg: 820mg  on 2/28 Scheduled for rituximab infusion on Monday. 375 mg/m: 800mg  IV Patient may need plasmapheresis and may need to be transferred to a tertiary care center.   1. Acute renal failure: RPGN - requiring hemodialysis 2. Hyperkalemia 3. Proteinuria: nephrotic range 6.4 grams on 2/20 4. Hematuria 5. Hypertension 6. Anemia of renal failure  Plan:  Rapid progressive glomerulonephritis. C- ANCA elevated on 2/20. Requiring hemodialysis Seen and examined on hemodialysis  treatment - IV steroids - IV cyclophosphamide on 2/28.  - IV rituximab scheduled 375mg /m2 on 3/2.  - May still need plasmapheresis pending final biopsy report.  - EPO with HD treatment   LOS: 12 Arina Torry 3/2/20202:12 PM

## 2018-09-03 DIAGNOSIS — N019 Rapidly progressive nephritic syndrome with unspecified morphologic changes: Secondary | ICD-10-CM

## 2018-09-03 MED ORDER — SODIUM CHLORIDE 0.9 % IV SOLN
500.0000 mg | Freq: Every day | INTRAVENOUS | Status: AC
  Administered 2018-09-03: 19:00:00 500 mg via INTRAVENOUS
  Filled 2018-09-03 (×2): qty 4

## 2018-09-03 MED ORDER — SULFAMETHOXAZOLE-TRIMETHOPRIM 200-40 MG/5ML PO SUSP: 10.0000 mL | Freq: Every day | ORAL | 0 refills | Status: DC

## 2018-09-03 MED ORDER — OXYMETAZOLINE HCL 0.05 % NA SOLN: 6.0000 | Freq: Two times a day (BID) | NASAL | 0 refills | Status: DC | PRN

## 2018-09-03 MED ORDER — POLYETHYLENE GLYCOL 3350 17 G PO PACK: 17.0000 g | PACK | Freq: Every day | ORAL | 0 refills | Status: DC | PRN

## 2018-09-03 MED ORDER — BUTALBITAL-APAP-CAFFEINE 50-325-40 MG PO TABS: 1.0000 | ORAL_TABLET | Freq: Four times a day (QID) | ORAL | 0 refills | Status: DC | PRN

## 2018-09-03 MED ORDER — SODIUM CHLORIDE 0.9 % IV SOLN
250.0000 mg | Freq: Every day | INTRAVENOUS | Status: DC
  Filled 2018-09-03: qty 2

## 2018-09-03 MED ORDER — SULFAMETHOXAZOLE-TRIMETHOPRIM 200-40 MG/5ML PO SUSP
10.0000 mL | Freq: Every day | ORAL | Status: DC
  Administered 2018-09-03: 10 mL via ORAL
  Filled 2018-09-03: qty 10

## 2018-09-03 MED ORDER — METOPROLOL TARTRATE 50 MG PO TABS: 50.0000 mg | ORAL_TABLET | Freq: Two times a day (BID) | ORAL | 0 refills | Status: DC

## 2018-09-03 NOTE — Progress Notes (Signed)
[facility]  ROUNDING NOTE   Subjective:   Seen and examined on hemodialysis treatment. Tolerating treatment well.   Rituximab infusion yesterday.   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  Physical Exam: General: NAD,   Head: Normocephalic, atraumatic. Moist oral mucosal membranes  Eyes: Anicteric, PERRL  Neck: Supple, trachea midline  Lungs:  Clear to auscultation  Heart: Regular rate and rhythm  Abdomen:  Soft, nontender, obese  Extremities:  1+ peripheral edema.  Neurologic: Nonfocal, moving all four extremities  Skin: No lesions  Access: RIJ permcath    Basic Metabolic Panel: Recent Labs  Lab 08/28/18 0627 08/29/18 1039 08/31/18 0429 09/02/18 0440  NA 139  --  138 135  K 4.7  --  5.4* 5.5*  CL 98  --  97* 98  CO2 29  --  25 22  GLUCOSE 115*  --  163* 137*  BUN 49*  --  88* 94*  CREATININE 7.14*  --  7.36* 6.59*  CALCIUM 8.1*  --  8.4* 8.2*  PHOS  --  5.7*  --   --     Liver Function Tests: No results for input(s): AST, ALT, ALKPHOS, BILITOT, PROT, ALBUMIN in the last 168 hours. No results for input(s): LIPASE, AMYLASE in the last 168 hours. No results for input(s): AMMONIA in the last 168 hours.  CBC: Recent Labs  Lab 08/28/18 1745 08/31/18 0429 09/02/18 0440  WBC  --  16.5* 8.7  HGB 9.8* 8.8* 8.4*   HCT 30.7* 27.4* 25.8*  MCV  --  90.1 88.4  PLT  --  301 282    Cardiac Enzymes: No results for input(s): CKTOTAL, CKMB, CKMBINDEX, TROPONINI in the last 168 hours.  BNP: Invalid input(s): POCBNP  CBG: No results for input(s): GLUCAP in the last 168 hours.  Microbiology: Results for orders placed or performed during the hospital encounter of 08/21/18  Urine Culture     Status: None   Collection Time: 08/21/18  9:34 AM  Result Value Ref Range Status   Specimen Description   Final    URINE, RANDOM Performed at [facility], [address], [place], [place] [address]    Special Requests   Final    NONE Performed at [facility], [address] [place], [place] [address]    Culture   Final    NO GROWTH Performed at [facility], [place] [address], [place], [place] [address]    Report Status 08/22/2018 FINAL  Final  MRSA PCR Screening     Status: None   Collection Time: 08/22/18  1:48 AM  Result Value Ref Range Status   MRSA by PCR NEGATIVE NEGATIVE Final    Comment:        The GeneXpert MRSA Assay (FDA approved for NASAL specimens only), is one  component of a comprehensive MRSA colonization surveillance program. It is not intended to diagnose MRSA infection nor to guide or monitor treatment for MRSA infections. Performed at [facility], [address] [place], [place] [address]   Gastrointestinal Panel by PCR , Stool     Status: None   Collection Time: 08/23/18 12:56 PM  Result Value Ref Range Status   Campylobacter species NOT DETECTED NOT DETECTED Final   Plesimonas shigelloides NOT DETECTED NOT DETECTED Final   Salmonella species NOT DETECTED NOT DETECTED Final   Yersinia enterocolitica NOT DETECTED NOT DETECTED Final   Vibrio species NOT DETECTED NOT DETECTED Final   Vibrio cholerae NOT DETECTED NOT DETECTED Final   Enteroaggregative E coli (EAEC) NOT DETECTED NOT DETECTED Final   Enteropathogenic E coli (EPEC) NOT  DETECTED NOT DETECTED Final   Enterotoxigenic E coli (ETEC) NOT DETECTED NOT DETECTED Final   Shiga like toxin producing E coli (STEC) NOT DETECTED NOT DETECTED Final   Shigella/Enteroinvasive E coli (EIEC) NOT DETECTED NOT DETECTED Final   Cryptosporidium NOT DETECTED NOT DETECTED Final   Cyclospora cayetanensis NOT DETECTED NOT DETECTED Final   Entamoeba histolytica NOT DETECTED NOT DETECTED Final   Giardia lamblia NOT DETECTED NOT DETECTED Final   Adenovirus F40/41 NOT DETECTED NOT DETECTED Final   Astrovirus NOT DETECTED NOT DETECTED Final   Norovirus GI/GII NOT DETECTED NOT DETECTED Final   Rotavirus A NOT DETECTED NOT DETECTED Final   Sapovirus (I, II, IV, and V) NOT DETECTED NOT DETECTED Final    Comment: Performed at [facility], [address] [place], [place] [address]    Coagulation Studies: No results for input(s): LABPROT, INR in the last 72 hours.  Urinalysis: No results for input(s): COLORURINE, LABSPEC, PHURINE, GLUCOSEU, HGBUR, BILIRUBINUR, KETONESUR, PROTEINUR, UROBILINOGEN, NITRITE, LEUKOCYTESUR in the last 72 hours.  Invalid input(s): APPERANCEUR    Imaging: No results found.   Medications:   . sodium chloride    . sodium chloride Stopped (08/30/18 0910)  . methylPREDNISolone (SOLU-MEDROL) injection 1,000 mg (09/02/18 1113)   . amLODipine  10 mg Oral Daily  . Chlorhexidine Gluconate Cloth  6 each Topical Q0600  . epoetin (EPOGEN/PROCRIT) injection  10,000 Units Intravenous Q T,Th,Sa-HD  . feeding supplement (NEPRO CARB STEADY)  237 mL Oral BID BM  . heparin injection (subcutaneous)  5,000 Units Subcutaneous Q8H  . metoprolol tartrate  50 mg Oral BID  . multivitamin  1 tablet Oral QHS  . nitroGLYCERIN  0.5 inch Topical Q6H  . sulfamethoxazole-trimethoprim  10 mL Oral Daily   sodium chloride, acetaminophen **OR** acetaminophen, butalbital-acetaminophen-caffeine, hydrALAZINE, ondansetron **OR** ondansetron (ZOFRAN) IV, oxymetazoline,  polyethylene glycol  Assessment/ Plan:  Jennifer Newman is a 53 y.o. white female with bipolar disorder, hypertension, depression, anxiety who was admitted to [facility] on 08/21/2018 for acute renal failure.  First hemodialysis treatment was on 2/20. Right femoral temp HD catheter. Permcath placed on 2/24 Dr. 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  Received IV cyclophosphamide 7.5mg /kg: 820mg  on 2/28 Received rituximab infusion 375 mg/m: 800mg  IV on 3/2 Patient may need plasmapheresis and may need to be transferred to a tertiary care center.   1. Acute renal failure: RPGN - requiring hemodialysis 2. Hyperkalemia 3. Proteinuria: nephrotic range 6.4 grams on 2/20 4. Hematuria 5. Hypertension 6. Anemia of renal failure  Plan:  Rapid progressive glomerulonephritis. C- ANCA elevated on 2/20. Requiring hemodialysis Seen and examined on hemodialysis  treatment - IV steroids: start tapering today to 500mg  IV methylprednisone - IV cyclophosphamide on 2/28.  - IV rituximab 375mg /m2 on 3/2.  - PJP prophylaxis: TMP/SMX single strength daily.  - May still need plasmapheresis pending discussion with [facility] Glomerulonephritis Department.  - EPO with HD treatment   LOS: [place] 3/3/20201:04 PM

## 2018-09-03 NOTE — Progress Notes (Signed)
[facility] [place] NAME: Jennifer Newman    MR#:  [id number]  DATE OF BIRTH:  1966-06-10  SUBJECTIVE:  Patient denies any complaints.  Scheduled for hemodialysis today, seen during dialysis.  Received chemotherapy yesterday. REVIEW OF SYSTEMS:    ROS  CONSTITUTIONAL: No documented fever. No fatigue, weakness. No weight gain, no weight loss.  EYES: No blurry or double vision.  ENT: No tinnitus. No postnasal drip. No redness of the oropharynx.  Had some nasal bleed RESPIRATORY: No cough, no wheeze, no hemoptysis. No dyspnea.  CARDIOVASCULAR: No chest pain. No orthopnea. No palpitations. No syncope.  GASTROINTESTINAL: No nausea, no vomiting or diarrhea. No abdominal pain. No melena or hematochezia.  GENITOURINARY: No dysuria or hematuria.  ENDOCRINE: No polyuria or nocturia. No heat or cold intolerance.  HEMATOLOGY: No anemia. No bruising. No bleeding.  INTEGUMENTARY: No rashes. No lesions.  MUSCULOSKELETAL: No arthritis. No swelling. No gout.  NEUROLOGIC: No numbness, tingling, or ataxia. No seizure-type activity.  PSYCHIATRIC: No anxiety. No insomnia. No ADD.   DRUG ALLERGIES:  No Known Allergies  VITALS:  Blood pressure (!) 149/84, pulse (!) 56, temperature 98 F (36.7 C), temperature source Oral, resp. rate 18, height 5\' 3"  (1.6 m), weight 108.5 kg, last menstrual period 04/09/2018, SpO2 97 %.  PHYSICAL EXAMINATION:   Physical Exam  GENERAL:  53 y.o.-year-old patient lying in the bed with no acute distress.  EYES: Pupils equal, round, reactive to light and accommodation. No scleral icterus. Extraocular muscles intact.  HEENT: Head atraumatic, normocephalic. Oropharynx and nasopharynx clear.  NECK:  Supple, no jugular venous distention. No thyroid enlargement, no tenderness.  LUNGS: Normal breath sounds bilaterally, no wheezing, rales, rhonchi. No use of accessory muscles of respiration.  CARDIOVASCULAR: S1, S2 normal. No murmurs,  rubs, or gallops.  ABDOMEN: Soft, nontender, nondistended. Bowel sounds present. No organomegaly or mass.  EXTREMITIES: No cyanosis, clubbing or edema b/l.    NEUROLOGIC: Cranial nerves II through XII are intact. No focal Motor or sensory deficits b/l.   PSYCHIATRIC: The patient is alert and oriented x 3.  SKIN: No obvious rash, lesion, or ulcer.    RIJ permcath  LABORATORY PANEL:   CBC Recent Labs  Lab 09/02/18 0440  WBC 8.7  HGB 8.4*  HCT 25.8*  PLT 282   ------------------------------------------------------------------------------------------------------------------ Chemistries  Recent Labs  Lab 09/02/18 0440  NA 135  K 5.5*  CL 98  CO2 22  GLUCOSE 137*  BUN 94*  CREATININE 6.59*  CALCIUM 8.2*   ------------------------------------------------------------------------------------------------------------------  Cardiac Enzymes No results for input(s): TROPONINI in the last 168 hours. ------------------------------------------------------------------------------------------------------------------  RADIOLOGY:  No results found.   ASSESSMENT AND PLAN:   53 year old female patient with history of hypertension, bipolar disorder, OCD, tachycardia, vertigo currently under hospitalist service for acute renal failure.  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  Waiting for final biopsy results. Received IV cyclophosphamide 7.5mg /kg: 820mg  on 2/28 Received rituximab yesterday.-First hemodialysis was on February 28.,  Permacath placed on 24th by vascular.  Waiting for nephrology input to see if patient should be transferred for plasmapheresis.  Spoke with nephrology, Dr. Juleen China mentioned that he will talk to [facility], will let me know.  Hyperkalemia Recheck labs  tomorrow.  -Hyperphosphatemia improved Continue monitoring  -Proteinuria, hematuria and pyuria based on urinalysis Appreciate nephrology evaluation Await renal  biopsy results  -Nasal bleed Resolved ENT consult done Clinical monitoring No packing recommended  -Obesity Weight loss counseling given  -Diarrhea self-limiting  -Hypertension ;relative bradycardia with heart rate 56 bpm, adjusted the dose of metoprolol yesterday. -DVT prophylaxis Subcu heparin to continue  All the records are reviewed and case discussed with Care Management/Social Worker. Management plans discussed with the patient, family and they are in agreement.  CODE STATUS: Full code  DVT Prophylaxis: SCDs  TOTAL TIME TAKING CARE OF THIS PATIENT: 51 minutes.   POSSIBLE D/C IN 2 to 3 DAYS, DEPENDING ON CLINICAL CONDITION.  Epifanio Lesches M.D on 09/03/2018 at 12:57 PM  Between 7am to 6pm - Pager - [phone]  After 6pm go to [email or web address] - password [email or web address] [place] [facility]  Office  [phone]  CC: Primary care physician; Barbaraann Boys, MD  Note: This dictation was prepared with Dragon dictation along with smaller phrase technology. Any transcriptional errors that result from this process are unintentional.

## 2018-09-03 NOTE — Discharge Summary (Signed)
DEMETRIC PARSLOW, is a 53 y.o. female  DOB 06-29-1966  MRN [id number].  Admission date:  08/21/2018  Admitting Physician  Bettey Costa, MD  Discharge Date:  09/03/2018   Primary MD  Barbaraann Boys, MD  Recommendations for primary care physician for things to follow:  Pending transfer to [facility] for plasmapheresis.  Waiting for the bed at [facility].    Admission Diagnosis  Hydronephrosis [N13.30] Hyperkalemia [E87.5] Acute renal failure, unspecified acute renal failure type ([place]) [N17.9]   Discharge Diagnosis  Hydronephrosis [N13.30] Hyperkalemia [E87.5] Acute renal failure, unspecified acute renal failure type ([place]) [N17.9]   Active Problems:   Acute kidney injury ([place])   Pauci-immune RPGN (rapidly progressive glomerulonephritis)      Past Medical History:  Diagnosis Date  . Anxiety   . Bipolar disorder ([place])   . Depression   . Hypertension   . Obsessive-compulsive disorder   . PTSD (post-traumatic stress disorder)   . Tachycardia, unspecified   . Vertigo     Past Surgical History:  Procedure Laterality Date  . DIALYSIS/PERMA CATHETER INSERTION N/A 08/22/2018   Procedure: DIALYSIS temporary catheter INSERTION;  Surgeon: Algernon Huxley, MD;  Location: [place] [facility];  Service: Cardiovascular;  Laterality: N/A;  . DIALYSIS/PERMA CATHETER INSERTION N/A 08/26/2018   Procedure: DIALYSIS/PERMA CATHETER INSERTION;  Surgeon: Algernon Huxley, MD;  Location: [place] [facility];  Service: Cardiovascular;  Laterality: N/A;  . none         History of present illness and  Hospital Course:     Kindly see H&P for history of present illness and admission details, please review complete Labs, Consult reports and Test reports for all details in brief  HPI  from the history and physical done on the day of  admission 53 year old female patient with history of anxiety, depression, hypertension, PTSD came in because of headache, nausea, poor urine output, admitted on February 19, found to have Ione.  Patient had no urine output for 4 days.   Hospital Course  #1 acute kidney injury with symptomatic shortness of breath, severe hyperkalemia, patient had no urine output for 4 days.  Admitted to medical service, initially thought to have acute kidney injury secondary to dehydration, patient recently had upper respiratory illness, patient received aggressive IV hydration, CT of the kidneys without contrast on admission showed no obstruction, no hydronephrosis.  Patient continued to have worsening renal function, shortness of breath, weight gain, seen by nephrology, patient started on temporary hemodialysis, started on first hemodialysis session on February 20 via right femoral temporary HD catheter placed by vascular.  Continue to require dialysis, patient received permacath placement on February 24, renal biopsy is done on February 25 and it shows rapidly progressing glomerulonephritis, patient has Necrotizing crescents, has been receiving IV stop cyclophosphamide, rituximab, high-dose IV steroids, nephrologist from our hospital Dr. Juleen China spoke with nephrologist at [place] he recommended transfer to [facility] for plasmapheresis.  Patient started on Bactrim single strength daily for PCP prophylaxis. #2 .severe hyperkalemia, patient found to have potassium of 7.1, creatinine of 14on admission, received Lokelma, bicarb, received hemodialysis.  Patient's potassium 5.5, creatinine 6.59 yesterday., #3. mild epistaxis, seen by ENT, thought to have Air recommended humidifier.  Patient epistaxis resolved. I spoke with Dr. Doy Mince, accepted the transfer to [place] bed, she is on wait list.,  Waiting for bed at [facility]   Follow UP       Discharge Instructions  and  Discharge Medications      Allergies as of 09/03/2018   No Known Allergies     Medication List    TAKE these medications   butalbital-acetaminophen-caffeine 50-325-40 MG tablet Commonly known as:  FIORICET, ESGIC Take 1 tablet by mouth every 6 (six) hours as needed for headache. What changed:  when to take this   fluticasone 50 MCG/ACT nasal spray Commonly known as:  FLONASE Place 1 spray into both nostrils 2 (two) times daily.   metoprolol succinate 100 MG 24 hr tablet Commonly known as:  TOPROL-XL   metoprolol tartrate 50 MG tablet Commonly known as:  LOPRESSOR Take 1 tablet (50 mg total) by mouth 2 (two) times daily.   oxymetazoline 0.05 % nasal spray Commonly known as:  AFRIN Place 6 sprays into both nostrils 2 (two) times daily as needed for congestion (only use with active nose bleeding).   polyethylene glycol packet Commonly known as:  MIRALAX / GLYCOLAX Take 17 g by mouth daily as needed for mild constipation.   sulfamethoxazole-trimethoprim 200-40 MG/5ML suspension Commonly known as:  BACTRIM,SEPTRA Take 10 mLs by mouth daily.     continue meds as per [facility]    Diet and Activity recommendation: See Discharge Instructions above   Consults obtained - Nephrology   Major procedures and Radiology Reports - PLEASE review detailed and final reports for all details, in brief -    Ct Head Wo Contrast  Result Date: 08/21/2018 CLINICAL DATA:  52 year old female with bipolar disorder and headaches EXAM: CT HEAD WITHOUT CONTRAST TECHNIQUE: Contiguous axial images were obtained from the base of the skull through the vertex without intravenous contrast. COMPARISON:  12/13/2016 FINDINGS: Brain: No acute intracranial hemorrhage. No midline shift or mass effect. Gray-white differentiation maintained. Unremarkable appearance of the ventricular system. Vascular: Unremarkable. Skull: No acute fracture.  No aggressive bone lesion identified.  Sinuses/Orbits: Unremarkable appearance of the orbits. Mastoid air cells clear. No middle ear effusion. No significant sinus disease. Other: None IMPRESSION: Negative head CT Electronically Signed   By: Corrie Mckusick D.O.   On: 08/21/2018 12:40   Dg Chest Portable 1 View  Result Date: 08/21/2018 CLINICAL DATA:  Headache. EXAM: PORTABLE CHEST 1 VIEW COMPARISON:  12/13/2016 FINDINGS: The heart is enlarged but appears stable. Slightly prominent mediastinal and hilar contours are unchanged. No acute pulmonary findings. No infiltrates or effusions. The bony thorax is intact. IMPRESSION: Stable cardiac enlargement.  No acute pulmonary findings. Electronically Signed   By: Marijo Sanes M.D.   On: 08/21/2018 12:18   Dg Bone Survey Met  Result Date: 08/24/2018 CLINICAL DATA:  MGUS EXAM: METASTATIC BONE SURVEY COMPARISON:  None. FINDINGS: Cardiomegaly with vascular congestion.  No overt edema or effusions. No acute bony abnormality or focal lytic lesion within the visualized skeleton. Probable old healed fracture in the distal left fibula. Dialysis catheter noted in the right groin with the tip in the lower IVC. IMPRESSION: No acute bony abnormality or focal lytic lesion. Cardiomegaly, vascular congestion. Electronically Signed   By: Rolm Baptise M.D.   On: 08/24/2018 15:46   Ct Renal Stone Study  Result Date: 08/21/2018 CLINICAL DATA:  MISHEEL GOWANS is a 53 y.o. female with a history of hypertension bipolar disorder and anxiety who complains of generalized weakness headaches and decreased urine output for the past 4 to 5 days. EXAM: CT ABDOMEN AND PELVIS WITHOUT CONTRAST TECHNIQUE: Multidetector CT imaging of the abdomen and pelvis was performed following the standard protocol without IV contrast. COMPARISON:  CT of the chest on 12/12/2016 FINDINGS: Lower chest: There is focal subsegmental atelectasis at the lung bases. The heart size is normal. Hepatobiliary: There is diffuse low-attenuation of the liver  consistent with hepatic steatosis. Gallbladder is present and normal in CT appearance. Pancreas: Unremarkable. No pancreatic ductal dilatation or surrounding inflammatory changes. Spleen: No splenic injury or perisplenic hematoma. Adrenals/Urinary Tract: Adrenal glands are unremarkable. Kidneys are normal, without renal calculi, focal lesion, or hydronephrosis. Bladder is unremarkable. Stomach/Bowel: The stomach is unremarkable. Small bowel loops are normal in caliber. No bowel wall thickening or dilatation. The appendix is well seen and has a normal appearance. Loops of colon are normal in appearance. Vascular/Lymphatic: There is minimal atherosclerotic calcification of the abdominal aorta. No associated aneurysm. Reproductive: Uterus is enlarged. No adnexal mass. Other: No free pelvic fluid. Anterior abdominal wall is unremarkable. Musculoskeletal: No acute or significant osseous findings. IMPRESSION: 1. No acute abnormality of the abdomen or pelvis. 2. Hepatic steatosis. 3. Normal appendix. Aortic Atherosclerosis (ICD10-I70.0). Electronically Signed   By: Nolon Nations M.D.   On: 08/21/2018 12:45   US Biopsy (kidney)  Result Date: 08/28/2018 INDICATION: 46 year old with acute kidney injury.  Request for a renal biopsy. EXAM: ULTRASOUND-GUIDED LEFT RENAL BIOPSY MEDICATIONS: None. ANESTHESIA/SEDATION: Fentanyl 25 mcg The patient's level of consciousness and vital signs were monitored continuously by radiology nursing throughout the procedure under my direct supervision. FLUOROSCOPY TIME:  None COMPLICATIONS: None immediate. PROCEDURE: Informed written consent was obtained from the patient after a thorough discussion of the procedural risks, benefits and alternatives. All questions were addressed. A timeout was performed prior to the initiation of the procedure. Patient was placed prone. Both kidneys were evaluated with ultrasound. The left kidney lower pole was targeted for biopsy. The left flank was prepped  with chlorhexidine and sterile field was created. Skin and soft tissues were anesthetized with 1% lidocaine. Using ultrasound guidance, 16 gauge core biopsy was obtained from the left kidney lower pole. Adequate specimens obtained and placed on a Telfa pad with saline. A second 16 gauge core biopsy was obtained from the left kidney lower pole. Again, an adequate core biopsy was obtained. Bandage placed over the puncture site. FINDINGS: Both kidneys are echogenic without hydronephrosis. Core biopsies obtained from the left kidney lower pole. Adequate core biopsies were obtained. Negative for hematoma or bleeding after the procedure. IMPRESSION: Successful ultrasound-guided core biopsies of the left kidney lower pole. Electronically Signed   By: Markus Daft M.D.   On: 08/28/2018 16:04    Micro Results     No results found for this or any previous visit (from the past 240 hour(s)).     Today   Subjective:   Tresa Res stable for transfer to [facility]  Objective:   Blood pressure (!) 143/83, pulse (!) 58, temperature 98.1 F (36.7 C), temperature source Oral, resp. rate 16, height 5' 3"  (1.6 m), weight 106.3 kg, last menstrual period 04/09/2018, SpO2 93 %.   Intake/Output Summary (Last 24 hours) at 09/03/2018 1858 Last data filed at 09/03/2018 1700 Gross per 24 hour  Intake 493 ml  Output 2008 ml  Net -1515 ml    Exam Awake Alert, Oriented x 3, No new F.N deficits, Normal affect [place].AT,PERRAL Supple Neck,No JVD, No cervical lymphadenopathy appriciated.  Symmetrical Chest wall movement, Good air movement bilaterally, CTAB RRR,No Gallops,Rubs or new Murmurs, No Parasternal Heave +ve B.Sounds, Abd Soft, Non tender, No organomegaly appriciated, No rebound -guarding or rigidity. No Cyanosis, Clubbing or edema, No new Rash  or bruise  Data Review   CBC w Diff:  Lab Results  Component Value Date   WBC 8.7 09/02/2018   HGB 8.4 (L) 09/02/2018   HGB 13.4 10/09/2013   HCT 25.8 (L) 09/02/2018    HCT 41.5 10/09/2013   PLT 282 09/02/2018   PLT 279 10/09/2013   LYMPHOPCT 12 08/21/2018   MONOPCT 11 08/21/2018   EOSPCT 1 08/21/2018   BASOPCT 1 08/21/2018    CMP:  Lab Results  Component Value Date   NA 135 09/02/2018   NA 135 (L) 10/09/2013   K 5.5 (H) 09/02/2018   K 3.6 10/09/2013   CL 98 09/02/2018   CL 106 10/09/2013   CO2 22 09/02/2018   CO2 23 10/09/2013   BUN 94 (H) 09/02/2018   BUN 16 10/09/2013   CREATININE 6.59 (H) 09/02/2018   CREATININE 0.60 10/09/2013   PROT 8.3 (H) 08/21/2018   PROT 8.6 (H) 10/09/2013   ALBUMIN 2.7 (L) 08/24/2018   ALBUMIN 4.0 10/09/2013   BILITOT 0.8 08/21/2018   BILITOT 0.3 10/09/2013   ALKPHOS 198 (H) 08/21/2018   ALKPHOS 118 (H) 10/09/2013   AST 20 08/21/2018   AST 36 10/09/2013   ALT 44 08/21/2018   ALT 38 10/09/2013  .   Total Time in preparing paper work, data evaluation and todays exam - 25 minutes  Epifanio Lesches M.D on 09/03/2018 at 6:58 PM    Note: This dictation was prepared with Dragon dictation along with smaller phrase technology. Any transcriptional errors that result from this process are unintentional.

## 2018-09-03 NOTE — Progress Notes (Signed)
[facility] called for patient transport.  EMS arrived 2215 to pt transfer.  Report called to [place] 2330.

## 2018-09-03 NOTE — Progress Notes (Signed)
Pre HD Assessment    09/03/18 1015  Neurological  Level of Consciousness Alert  Orientation Level Oriented X4  Respiratory  Respiratory Pattern Regular  Chest Assessment Chest expansion symmetrical  Bilateral Breath Sounds Diminished  Cough None  Cardiac  Pulse Regular  Heart Sounds S1, S2  ECG Monitor Yes  Vascular  R Radial Pulse +2  L Radial Pulse +2  Edema Generalized  Generalized Edema +1  Psychosocial  Psychosocial (WDL) WDL

## 2018-09-03 NOTE — Progress Notes (Signed)
Post HD Assessment    09/03/18 1444  Neurological  Level of Consciousness Alert  Orientation Level Oriented X4  Respiratory  Respiratory Pattern Regular  Chest Assessment Chest expansion symmetrical  Bilateral Breath Sounds Diminished  Cough None  Cardiac  Pulse Regular  Heart Sounds S1, S2  ECG Monitor Yes  Vascular  R Radial Pulse +2  L Radial Pulse +2  Edema Generalized  Generalized Edema +1  Psychosocial  Psychosocial (WDL) WDL

## 2018-09-03 NOTE — Progress Notes (Signed)
Pre HD    09/03/18 1027  Hand-Off documentation  Report given to (Full Name) Trellis Paganini, RN   Report received from (Full Name) Rowe Robert, RN   Vital Signs  Temp 98 F (36.7 C)  Temp Source Oral  Pulse Rate (!) 57  Pulse Rate Source Monitor  Resp 17  BP (!) 153/96  BP Location Left Arm  BP Method Automatic  Patient Position (if appropriate) Lying  Oxygen Therapy  SpO2 98 %  O2 Device Room Air  Pain Assessment  Pain Scale 0-10  Pain Score 0  Dialysis Weight  Weight 108.5 kg  Type of Weight Pre-Dialysis  Time-Out for Hemodialysis  What Procedure? HD  Pt Identifiers(min of two) First/Last Name;MRN/Account#  Correct Site? Yes  Correct Side? Yes  Correct Procedure? Yes  Consents Verified? Yes  Rad Studies Available? N/A  Safety Precautions Reviewed? Yes  Engineer, civil (consulting) Number 7  Station Number 2  UF/Alarm Test Passed  Conductivity: Meter 14  Conductivity: Machine  14  pH 7.4  Reverse Osmosis Main  Normal Saline Lot Number [id number]  Dialyzer Lot Number [id number]  Disposable Set Lot Number [id number]  Machine Temperature 98.6 F (37 C)  Musician and Audible Yes  Blood Lines Intact and Secured Yes  Pre Treatment Patient Checks  Vascular access used during treatment Catheter  Hepatitis B Surface Antigen Results Negative  Date Hepatitis B Surface Antigen Drawn 08/21/18  Hemodialysis Consent Verified Yes  Hemodialysis Standing Orders Initiated Yes  ECG (Telemetry) Monitor On Yes  Prime Ordered Normal Saline  Length of  DialysisTreatment -hour(s) 3 Hour(s)  Dialysis Treatment Comments Na 140  Dialyzer Elisio 17H NR  Dialysate 3K, 2.5 Ca  Dialysis Anticoagulant None  Dialysate Flow Ordered 600  Blood Flow Rate Ordered 400 mL/min  Ultrafiltration Goal 2 Liters  Dialysis Blood Pressure Support Ordered Normal Saline  Education / Care Plan  Dialysis Education Provided Yes  Documented Education in Care Plan Yes  Hemodialysis Catheter Right  Subclavian Double-lumen;Permanent  Placement Date/Time: 08/26/18 1300   Orientation: Right  Access Location: Subclavian  Hemodialysis Catheter Type: Double-lumen;Permanent  Site Condition No complications  Blue Lumen Status Blood return noted  Red Lumen Status Blood return noted  Purple Lumen Status N/A  Dressing Status Clean;Dry;Intact

## 2018-09-03 NOTE — Progress Notes (Signed)
HD Tx completed tolerated well, UF goal met. Pt to sit in recliner next TX in prep for discharge.    09/03/18 1330  Vital Signs  Pulse Rate (!) 56  Pulse Rate Source Monitor  Resp 15  BP (!) 146/71  BP Location Left Arm  BP Method Automatic  Patient Position (if appropriate) Lying  Oxygen Therapy  SpO2 93 %  During Hemodialysis Assessment  HD Safety Checks Performed Yes  Dialysis Fluid Bolus Normal Saline  Bolus Amount (mL) 250 mL  Intra-Hemodialysis Comments Tx completed;Tolerated well   

## 2018-09-03 NOTE — Progress Notes (Signed)
Post HD Assessment    09/03/18 1345  Vital Signs  Temp 98.1 F (36.7 C)  Temp Source Oral  Pulse Rate (!) 58  Pulse Rate Source Monitor  Resp 16  BP (!) 143/83  BP Location Left Arm  BP Method Automatic  Patient Position (if appropriate) Lying  Oxygen Therapy  SpO2 93 %  O2 Device Room Air  Pain Assessment  Pain Scale 0-10  Pain Score 0  Dialysis Weight  Weight 106.3 kg  Type of Weight Post-Dialysis  Post-Hemodialysis Assessment  Rinseback Volume (mL) 250 mL  KECN 75.1 V  Dialyzer Clearance Lightly streaked  Duration of HD Treatment -hour(s) 3 hour(s)  Hemodialysis Intake (mL) 500 mL  UF Total -Machine (mL) 2508 mL  Net UF (mL) 2008 mL  Tolerated HD Treatment Yes  Hemodialysis Catheter Right Subclavian Double-lumen;Permanent  Placement Date/Time: 08/26/18 1300   Orientation: Right  Access Location: Subclavian  Hemodialysis Catheter Type: Double-lumen;Permanent  Site Condition No complications  Blue Lumen Status Heparin locked  Red Lumen Status Heparin locked  Post treatment catheter status Capped and Clamped

## 2018-09-04 DIAGNOSIS — F419 Anxiety disorder, unspecified: Secondary | ICD-10-CM | POA: Insufficient documentation

## 2018-09-04 DIAGNOSIS — R768 Other specified abnormal immunological findings in serum: Secondary | ICD-10-CM | POA: Insufficient documentation

## 2018-09-04 DIAGNOSIS — F319 Bipolar disorder, unspecified: Secondary | ICD-10-CM | POA: Insufficient documentation

## 2018-09-04 DIAGNOSIS — N059 Unspecified nephritic syndrome with unspecified morphologic changes: Secondary | ICD-10-CM | POA: Insufficient documentation

## 2018-09-04 DIAGNOSIS — N179 Acute kidney failure, unspecified: Secondary | ICD-10-CM

## 2018-09-04 DIAGNOSIS — I10 Essential (primary) hypertension: Secondary | ICD-10-CM | POA: Insufficient documentation

## 2018-09-24 DIAGNOSIS — R519 Headache, unspecified: Secondary | ICD-10-CM | POA: Insufficient documentation

## 2018-10-07 ENCOUNTER — Telehealth: Payer: Self-pay | Admitting: *Deleted

## 2018-10-07 NOTE — Telephone Encounter (Signed)
Said that we have referral for patient to cont. Cytoxan. I also saw in the notes that patient got rituxan.  In order to give the next dose. We would need what the dose is and when is it due to be given. If it has premeds then need to know premed and how much and when to give it.  Also because it is non oncology dx the office has to get authorization for the drugs to be given here at cancer center and then it could be set up. I have left my name and number since the office is closed

## 2018-10-08 DIAGNOSIS — Z992 Dependence on renal dialysis: Secondary | ICD-10-CM | POA: Insufficient documentation

## 2018-10-08 DIAGNOSIS — F321 Major depressive disorder, single episode, moderate: Secondary | ICD-10-CM | POA: Insufficient documentation

## 2018-10-14 ENCOUNTER — Telehealth: Payer: Self-pay | Admitting: *Deleted

## 2018-10-14 NOTE — Telephone Encounter (Signed)
I called Melissa the new pt. Coordinator to ask if she got any info on. Patient about scheduling. I told her that I called last week and left message at Dr. Juleen China office that since this is a non oncology infusion that their office would need to get approval for the infusion of cytoxan in our infusion area and then send the rx to our office with the orders for cytoxan and exact dose and when it needs to be given. Then any pre meds has to be on the order also. She states that she thinks they already sent it. I looked in computer and there is an order for cytoxan and dose but no premeds and she states that it is 60 mg IV steroid. She will look into whether approval for cancer center infusion has been approved. She is working from home and will send message to [place] at office. She or someone from office to call me back

## 2018-10-16 DIAGNOSIS — G43009 Migraine without aura, not intractable, without status migrainosus: Secondary | ICD-10-CM | POA: Insufficient documentation

## 2018-10-17 ENCOUNTER — Other Ambulatory Visit: Payer: Self-pay | Admitting: Oncology

## 2018-10-17 DIAGNOSIS — N019 Rapidly progressive nephritic syndrome with unspecified morphologic changes: Secondary | ICD-10-CM

## 2018-10-17 NOTE — Progress Notes (Signed)
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  She received 1 dose of Cytoxan and 2 doses of Rituxan at [facility].  She also required plasmapheresis at that time.  She is referred to clinic today for further evaluation and continuation of monthly Cytoxan.  She has increased weakness and fatigue, but otherwise feels well.  She has no neurologic complaints.  She denies any recent fevers or illnesses.  She has a good appetite and denies weight loss.  She denies any chest pain, shortness of breath, cough, or hemoptysis.  She has no nausea, vomiting, constipation, or diarrhea.  She has no urinary complaints.  Patient offers no further specific complaints today.  REVIEW OF SYSTEMS:   Review of Systems  Constitutional: Positive for malaise/fatigue. Negative for fever and weight loss.  Respiratory: Negative.  Negative for cough, hemoptysis and shortness of breath.   Cardiovascular: Negative.  Negative for chest pain and leg swelling.  Gastrointestinal: Negative.  Negative for abdominal pain, blood in stool and melena.  Genitourinary: Negative.  Negative for dysuria.  Musculoskeletal: Negative.  Negative for back pain.  Skin: Negative.   Neurological: Negative.  Negative for dizziness, speech change, focal weakness and headaches.  Psychiatric/Behavioral: Negative.     As per HPI. Otherwise, a complete review of systems is negative.  PAST MEDICAL HISTORY: Past Medical History:  Diagnosis Date  . Anxiety   . Bipolar disorder ([place])   . Depression   . Hypertension   . Obsessive-compulsive disorder   . PTSD (post-traumatic stress disorder)    . Tachycardia, unspecified   . Vertigo     PAST SURGICAL HISTORY: Past Surgical History:  Procedure Laterality Date  . DIALYSIS/PERMA CATHETER INSERTION N/A 08/22/2018   Procedure: DIALYSIS temporary catheter INSERTION;  Surgeon: Algernon Huxley, MD;  Location: [place] [facility];  Service: Cardiovascular;  Laterality: N/A;  . DIALYSIS/PERMA CATHETER INSERTION N/A 08/26/2018   Procedure: DIALYSIS/PERMA CATHETER INSERTION;  Surgeon: Algernon Huxley, MD;  Location: [place] [facility];  Service: Cardiovascular;  Laterality: N/A;  . none      FAMILY HISTORY: Family History  Problem Relation Age of Onset  . COPD Neg Hx   . Diabetes Neg Hx   . Hypertension Neg Hx   . CAD Neg Hx     ADVANCED DIRECTIVES (Y/N):  N  HEALTH MAINTENANCE: Social History   Tobacco Use  . Smoking status: Never Smoker  . Smokeless tobacco: Never Used  Substance Use Topics  . Alcohol use: Yes    Alcohol/week: 1.0 standard drinks    Types: 1 Shots of liquor per week    Comment: occasionally  . Drug use: Not Currently    Types: Marijuana    Comment: Last used several weeks back      Colonoscopy:  PAP:  Bone density:  Lipid panel:  No Known Allergies  Current Outpatient Medications  Medication Sig Dispense Refill  . amLODipine (NORVASC) 10 MG tablet Take 1 tablet by mouth 1 day or 1 dose.    . butalbital-acetaminophen-caffeine (FIORICET, ESGIC) 50-325-40 MG tablet Take 1 tablet by mouth every 6 (six) hours as needed for headache. 14 tablet 0  . carvedilol (COREG) 25 MG tablet Take  1 tablet by mouth 2 (two) times daily.    . clonazePAM (KLONOPIN) 0.5 MG tablet Take 1 tablet by mouth 1 day or 1 dose.    . fluticasone (FLONASE) 50 MCG/ACT nasal spray Place 1 spray into both nostrils 2 (two) times daily.    . isosorbide mononitrate (IMDUR) 30 MG 24 hr tablet Take 1 tablet by mouth 1 day or 1 dose.    . nortriptyline (PAMELOR) 10 MG capsule Take 1 capsule by mouth at bedtime.    Marland Kitchen oxymetazoline (AFRIN)  0.05 % nasal spray Place 6 sprays into both nostrils 2 (two) times daily as needed for congestion (only use with active nose bleeding). 30 mL 0  . sertraline (ZOLOFT) 25 MG tablet Take 1 tablet by mouth 1 day or 1 dose.    . sevelamer carbonate (RENVELA) 800 MG tablet Take 1 tablet by mouth 3 (three) times daily after meals.    Marland Kitchen sulfamethoxazole-trimethoprim (BACTRIM,SEPTRA) 200-40 MG/5ML suspension Take 10 mLs by mouth daily. 100 mL 0   No current facility-administered medications for this visit.     OBJECTIVE: Vitals:   10/18/18 0909  BP: 130/78  Pulse: 80  Temp: 98.7 F (37.1 C)     Body mass index is 40.87 kg/m.    ECOG FS:0 - Asymptomatic  General: Well-developed, well-nourished, no acute distress. Eyes: Pink conjunctiva, anicteric sclera. HEENT: Normocephalic, moist mucous membranes, clear oropharnyx. Lungs: Clear to auscultation bilaterally. Heart: Regular rate and rhythm. No rubs, murmurs, or gallops. Abdomen: Soft, nontender, nondistended. No organomegaly noted, normoactive bowel sounds. Musculoskeletal: No edema, cyanosis, or clubbing. Neuro: Alert, answering all questions appropriately. Cranial nerves grossly intact. Skin: No rashes or petechiae noted. Psych: Normal affect. Lymphatics: No cervical, calvicular, axillary or inguinal LAD.   LAB RESULTS:  Lab Results  Component Value Date   NA 137 10/18/2018   K 3.0 (L) 10/18/2018   CL 99 10/18/2018   CO2 29 10/18/2018   GLUCOSE 94 10/18/2018   BUN 12 10/18/2018   CREATININE 3.84 (H) 10/18/2018   CALCIUM 9.2 10/18/2018   PROT 7.4 10/18/2018   ALBUMIN 4.1 10/18/2018   AST 19 10/18/2018   ALT 13 10/18/2018   ALKPHOS 204 (H) 10/18/2018   BILITOT 0.5 10/18/2018   GFRNONAA 13 (L) 10/18/2018   GFRAA 15 (L) 10/18/2018    Lab Results  Component Value Date   WBC 8.1 10/18/2018   NEUTROABS 5.0 10/18/2018   HGB 9.2 (L) 10/18/2018   HCT 28.9 (L) 10/18/2018   MCV 93.8 10/18/2018   PLT 272 10/18/2018      STUDIES: No results found.  ASSESSMENT: RPGN  PLAN:    1. RPGN: Patient received 2 doses of Rituxan and her first infusion of Cytoxan at [facility].  She presents to clinic today for continuation of treatment and cycle 2 of 800 mg Cytoxan only.  Plan is to do a total of 6 cycles every 28 days.  Proceed with cycle 2 today.  Return to clinic in 4 weeks for further evaluation and consideration of cycle 3. 2.  End-stage renal disease: Continue dialysis on Tuesdays, Thursdays, and Saturdays as scheduled.  Appreciate nephrology input. 3.  Anemia: Patient's hemoglobin is 9.2, monitor.  I spent a total of 60 minutes face-to-face with the patient of which greater than 50% of the visit was spent in counseling and coordination of care as detailed above.   Patient expressed understanding and was in agreement with this plan. She also understands that She can call clinic at  any time with any questions, concerns, or complaints.    Lloyd Huger, MD   10/18/2018 1:06 PM

## 2018-10-18 ENCOUNTER — Inpatient Hospital Stay: Payer: BLUE CROSS/BLUE SHIELD | Attending: Oncology | Admitting: Oncology

## 2018-10-18 ENCOUNTER — Other Ambulatory Visit: Payer: Self-pay

## 2018-10-18 ENCOUNTER — Inpatient Hospital Stay: Payer: BLUE CROSS/BLUE SHIELD

## 2018-10-18 ENCOUNTER — Encounter: Payer: Self-pay | Admitting: Oncology

## 2018-10-18 VITALS — BP 130/78 | HR 80 | Temp 98.7°F | Ht 63.0 in | Wt 230.7 lb

## 2018-10-18 DIAGNOSIS — B9789 Other viral agents as the cause of diseases classified elsewhere: Secondary | ICD-10-CM

## 2018-10-18 DIAGNOSIS — D649 Anemia, unspecified: Secondary | ICD-10-CM | POA: Diagnosis not present

## 2018-10-18 DIAGNOSIS — H60502 Unspecified acute noninfective otitis externa, left ear: Secondary | ICD-10-CM | POA: Insufficient documentation

## 2018-10-18 DIAGNOSIS — N019 Rapidly progressive nephritic syndrome with unspecified morphologic changes: Secondary | ICD-10-CM | POA: Insufficient documentation

## 2018-10-18 DIAGNOSIS — Z79899 Other long term (current) drug therapy: Secondary | ICD-10-CM | POA: Diagnosis not present

## 2018-10-18 DIAGNOSIS — N186 End stage renal disease: Secondary | ICD-10-CM | POA: Diagnosis not present

## 2018-10-18 DIAGNOSIS — I10 Essential (primary) hypertension: Secondary | ICD-10-CM

## 2018-10-18 DIAGNOSIS — F419 Anxiety disorder, unspecified: Secondary | ICD-10-CM

## 2018-10-18 DIAGNOSIS — J029 Acute pharyngitis, unspecified: Secondary | ICD-10-CM | POA: Insufficient documentation

## 2018-10-18 DIAGNOSIS — F429 Obsessive-compulsive disorder, unspecified: Secondary | ICD-10-CM | POA: Diagnosis not present

## 2018-10-18 DIAGNOSIS — H66002 Acute suppurative otitis media without spontaneous rupture of ear drum, left ear: Secondary | ICD-10-CM | POA: Insufficient documentation

## 2018-10-18 DIAGNOSIS — Z5111 Encounter for antineoplastic chemotherapy: Secondary | ICD-10-CM | POA: Diagnosis present

## 2018-10-18 DIAGNOSIS — F319 Bipolar disorder, unspecified: Secondary | ICD-10-CM

## 2018-10-18 DIAGNOSIS — Z992 Dependence on renal dialysis: Secondary | ICD-10-CM | POA: Diagnosis not present

## 2018-10-18 DIAGNOSIS — J028 Acute pharyngitis due to other specified organisms: Secondary | ICD-10-CM

## 2018-10-18 DIAGNOSIS — R05 Cough: Secondary | ICD-10-CM | POA: Insufficient documentation

## 2018-10-18 DIAGNOSIS — R059 Cough, unspecified: Secondary | ICD-10-CM | POA: Insufficient documentation

## 2018-10-18 LAB — CBC WITH DIFFERENTIAL/PLATELET
Abs Immature Granulocytes: 0.07 10*3/uL (ref 0.00–0.07)
Basophils Absolute: 0.1 10*3/uL (ref 0.0–0.1)
Basophils Relative: 1 %
Eosinophils Absolute: 0.1 10*3/uL (ref 0.0–0.5)
Eosinophils Relative: 2 %
HCT: 28.9 % — ABNORMAL LOW (ref 36.0–46.0)
Hemoglobin: 9.2 g/dL — ABNORMAL LOW (ref 12.0–15.0)
Immature Granulocytes: 1 %
Lymphocytes Relative: 20 %
Lymphs Abs: 1.6 10*3/uL (ref 0.7–4.0)
MCH: 29.9 pg (ref 26.0–34.0)
MCHC: 31.8 g/dL (ref 30.0–36.0)
MCV: 93.8 fL (ref 80.0–100.0)
Monocytes Absolute: 1.2 10*3/uL — ABNORMAL HIGH (ref 0.1–1.0)
Monocytes Relative: 15 %
Neutro Abs: 5 10*3/uL (ref 1.7–7.7)
Neutrophils Relative %: 61 %
Platelets: 272 10*3/uL (ref 150–400)
RBC: 3.08 MIL/uL — ABNORMAL LOW (ref 3.87–5.11)
RDW: 16.8 % — ABNORMAL HIGH (ref 11.5–15.5)
WBC: 8.1 10*3/uL (ref 4.0–10.5)
nRBC: 0 % (ref 0.0–0.2)

## 2018-10-18 LAB — COMPREHENSIVE METABOLIC PANEL
ALT: 13 U/L (ref 0–44)
AST: 19 U/L (ref 15–41)
Albumin: 4.1 g/dL (ref 3.5–5.0)
Alkaline Phosphatase: 204 U/L — ABNORMAL HIGH (ref 38–126)
Anion gap: 9 (ref 5–15)
BUN: 12 mg/dL (ref 6–20)
CO2: 29 mmol/L (ref 22–32)
Calcium: 9.2 mg/dL (ref 8.9–10.3)
Chloride: 99 mmol/L (ref 98–111)
Creatinine, Ser: 3.84 mg/dL — ABNORMAL HIGH (ref 0.44–1.00)
GFR calc Af Amer: 15 mL/min — ABNORMAL LOW (ref >60)
GFR calc non Af Amer: 13 mL/min — ABNORMAL LOW (ref >60)
Glucose, Bld: 94 mg/dL (ref 70–99)
Potassium: 3 mmol/L — ABNORMAL LOW (ref 3.5–5.1)
Sodium: 137 mmol/L (ref 135–145)
Total Bilirubin: 0.5 mg/dL (ref 0.3–1.2)
Total Protein: 7.4 g/dL (ref 6.5–8.1)

## 2018-10-18 MED ORDER — METHYLPREDNISOLONE SODIUM SUCC 125 MG IJ SOLR
60.0000 mg | Freq: Once | INTRAMUSCULAR | Status: AC
  Administered 2018-10-18: 60 mg via INTRAVENOUS
  Filled 2018-10-18: qty 2

## 2018-10-18 MED ORDER — PALONOSETRON HCL INJECTION 0.25 MG/5ML
0.2500 mg | Freq: Once | INTRAVENOUS | Status: AC
  Administered 2018-10-18: 10:00:00 0.25 mg via INTRAVENOUS
  Filled 2018-10-18: qty 5

## 2018-10-18 MED ORDER — SODIUM CHLORIDE 0.9 % IV SOLN
800.0000 mg | Freq: Once | INTRAVENOUS | Status: AC
  Administered 2018-10-18: 800 mg via INTRAVENOUS
  Filled 2018-10-18: qty 40

## 2018-10-18 MED ORDER — SODIUM CHLORIDE 0.9 % IV SOLN
Freq: Once | INTRAVENOUS | Status: AC
  Administered 2018-10-18: 10:00:00 via INTRAVENOUS
  Filled 2018-10-18: qty 250

## 2018-10-18 NOTE — Progress Notes (Signed)
Creatinine: 3.84. MD, Dr. Grayland Ormond, notified and already aware. Per MD order: proceed with scheduled Cytoxan treatment today.

## 2018-10-18 NOTE — Progress Notes (Signed)
Patient is here today to establish care. Patient stated that she had been doing well.

## 2018-10-21 ENCOUNTER — Other Ambulatory Visit: Payer: Self-pay | Admitting: Oncology

## 2018-10-21 MED ORDER — PROCHLORPERAZINE MALEATE 10 MG PO TABS: 10.0000 mg | ORAL_TABLET | Freq: Four times a day (QID) | ORAL | 1 refills | Status: DC | PRN

## 2018-10-21 NOTE — Progress Notes (Unsigned)
.  comp

## 2018-11-15 ENCOUNTER — Other Ambulatory Visit: Payer: BLUE CROSS/BLUE SHIELD

## 2018-11-15 ENCOUNTER — Ambulatory Visit: Payer: BLUE CROSS/BLUE SHIELD

## 2018-11-15 ENCOUNTER — Ambulatory Visit: Payer: BLUE CROSS/BLUE SHIELD | Admitting: Oncology

## 2018-11-16 DIAGNOSIS — D689 Coagulation defect, unspecified: Secondary | ICD-10-CM | POA: Insufficient documentation

## 2018-11-16 NOTE — Progress Notes (Signed)
[name]  Telephone:(336) [phone] Fax:(336) [phone]  ID: Jennifer Newman OB: May 13, 1966  MR#: [id number]  PRF#:[id number]  Patient Care Team: Barbaraann Boys, MD as PCP - General (Pediatrics)  CHIEF COMPLAINT: RPGN  INTERVAL HISTORY: Patient returns to clinic today for further evaluation and consideration of cycle 3 of Cytoxan.  She is tolerating her treatments well without significant side effects.  She currently feels well and is asymptomatic. She has no neurologic complaints.  She denies any recent fevers or illnesses.  She has a good appetite and denies weight loss.  She denies any chest pain, shortness of breath, cough, or hemoptysis.  She has no nausea, vomiting, constipation, or diarrhea.  She has no urinary complaints.  Patient feels at her baseline offers no specific complaints today.  REVIEW OF SYSTEMS:   Review of Systems  Constitutional: Negative.  Negative for fever, malaise/fatigue and weight loss.  Respiratory: Negative.  Negative for cough, hemoptysis and shortness of breath.   Cardiovascular: Negative.  Negative for chest pain and leg swelling.  Gastrointestinal: Negative.  Negative for abdominal pain, blood in stool and melena.  Genitourinary: Negative.  Negative for dysuria.  Musculoskeletal: Negative.  Negative for back pain.  Skin: Negative.  Negative for rash.  Neurological: Negative.  Negative for dizziness, speech change, focal weakness and headaches.  Psychiatric/Behavioral: Negative.  The patient is not nervous/anxious.     As per HPI. Otherwise, a complete review of systems is negative.  PAST MEDICAL HISTORY: Past Medical History:  Diagnosis Date  . Anxiety   . Bipolar disorder ([place])   . Depression   . Hypertension   . Obsessive-compulsive disorder   . PTSD (post-traumatic stress disorder)   . Tachycardia, unspecified   . Vertigo     PAST SURGICAL HISTORY: Past Surgical History:  Procedure Laterality Date  . DIALYSIS/PERMA  CATHETER INSERTION N/A 08/22/2018   Procedure: DIALYSIS temporary catheter INSERTION;  Surgeon: Algernon Huxley, MD;  Location: [place] [facility];  Service: Cardiovascular;  Laterality: N/A;  . DIALYSIS/PERMA CATHETER INSERTION N/A 08/26/2018   Procedure: DIALYSIS/PERMA CATHETER INSERTION;  Surgeon: Algernon Huxley, MD;  Location: [place] [facility];  Service: Cardiovascular;  Laterality: N/A;  . none      FAMILY HISTORY: Family History  Problem Relation Age of Onset  . COPD Neg Hx   . Diabetes Neg Hx   . Hypertension Neg Hx   . CAD Neg Hx     ADVANCED DIRECTIVES (Y/N):  N  HEALTH MAINTENANCE: Social History   Tobacco Use  . Smoking status: Never Smoker  . Smokeless tobacco: Never Used  Substance Use Topics  . Alcohol use: Yes    Alcohol/week: 1.0 standard drinks    Types: 1 Shots of liquor per week    Comment: occasionally  . Drug use: Not Currently    Types: Marijuana    Comment: Last used several weeks back      Colonoscopy:  PAP:  Bone density:  Lipid panel:  No Known Allergies  Current Outpatient Medications  Medication Sig Dispense Refill  . amLODipine (NORVASC) 10 MG tablet Take 1 tablet by mouth 1 day or 1 dose.    . butalbital-acetaminophen-caffeine (FIORICET, ESGIC) 50-325-40 MG tablet Take 1 tablet by mouth every 6 (six) hours as needed for headache. 14 tablet 0  . carvedilol (COREG) 25 MG tablet Take 1 tablet by mouth 2 (two) times daily.    . clonazePAM (KLONOPIN) 0.5 MG tablet Take 1 tablet by mouth 1  day or 1 dose.    . fluticasone (FLONASE) 50 MCG/ACT nasal spray Place 1 spray into both nostrils 2 (two) times daily.    . isosorbide mononitrate (IMDUR) 30 MG 24 hr tablet Take 1 tablet by mouth 1 day or 1 dose.    . losartan (COZAAR) 100 MG tablet Take 1 tablet by mouth every evening.    . nortriptyline (PAMELOR) 10 MG capsule Take 1 capsule by mouth at bedtime.    Marland Kitchen oxymetazoline (AFRIN) 0.05 % nasal spray Place 6 sprays into both nostrils 2 (two) times  daily as needed for congestion (only use with active nose bleeding). 30 mL 0  . prochlorperazine (COMPAZINE) 10 MG tablet Take 1 tablet (10 mg total) by mouth every 6 (six) hours as needed for nausea or vomiting. 60 tablet 1  . sertraline (ZOLOFT) 25 MG tablet Take 1 tablet by mouth 1 day or 1 dose.    . sevelamer carbonate (RENVELA) 800 MG tablet Take 1 tablet by mouth 3 (three) times daily after meals.    Marland Kitchen sulfamethoxazole-trimethoprim (BACTRIM,SEPTRA) 200-40 MG/5ML suspension Take 10 mLs by mouth daily. 100 mL 0  . Vitamin D, Ergocalciferol, (DRISDOL) 1.25 MG (50000 UT) CAPS capsule Take 1 capsule by mouth once a week.     No current facility-administered medications for this visit.     OBJECTIVE: Vitals:   11/18/18 0935  BP: 119/75  Pulse: (!) 50  Resp: (!) 22  Temp: 97.8 F (36.6 C)     Body mass index is 38.79 kg/m.    ECOG FS:0 - Asymptomatic  General: Well-developed, well-nourished, no acute distress. Eyes: Pink conjunctiva, anicteric sclera. HEENT: Normocephalic, moist mucous membranes. Lungs: Clear to auscultation bilaterally. Heart: Regular rate and rhythm. No rubs, murmurs, or gallops. Abdomen: Soft, nontender, nondistended. No organomegaly noted, normoactive bowel sounds. Musculoskeletal: No edema, cyanosis, or clubbing. Neuro: Alert, answering all questions appropriately. Cranial nerves grossly intact. Skin: No rashes or petechiae noted. Psych: Normal affect.  LAB RESULTS:  Lab Results  Component Value Date   NA 138 11/18/2018   K 4.3 11/18/2018   CL 100 11/18/2018   CO2 26 11/18/2018   GLUCOSE 155 (H) 11/18/2018   BUN 55 (H) 11/18/2018   CREATININE 6.92 (H) 11/18/2018   CALCIUM 9.5 11/18/2018   PROT 7.4 11/18/2018   ALBUMIN 4.0 11/18/2018   AST 25 11/18/2018   ALT 19 11/18/2018   ALKPHOS 187 (H) 11/18/2018   BILITOT 0.4 11/18/2018   GFRNONAA 6 (L) 11/18/2018   GFRAA 7 (L) 11/18/2018    Lab Results  Component Value Date   WBC 8.0 11/18/2018    NEUTROABS 5.5 11/18/2018   HGB 10.2 (L) 11/18/2018   HCT 32.4 (L) 11/18/2018   MCV 91.3 11/18/2018   PLT 249 11/18/2018     STUDIES: No results found.  ASSESSMENT: RPGN  PLAN:    1. RPGN: Patient received 2 doses of Rituxan and her first infusion of Cytoxan at [facility]. Plan is to do a total of 6 cycles every 28 days.  Proceed with cycle 3 of 800 mg Cytoxan today.  Return to clinic in 4 weeks for further evaluation and consideration of cycle 4.   2.  End-stage renal disease: Continue dialysis on Tuesdays, Thursdays, and Saturdays as scheduled.  Continue follow-up with nephrology as scheduled. 3.  Anemia: Patient's hemoglobin is improving to 10.2.  Monitor.  I spent a total of 30 minutes face-to-face with the patient of which greater than 50% of the visit was  spent in counseling and coordination of care as detailed above.   Patient expressed understanding and was in agreement with this plan. She also understands that She can call clinic at any time with any questions, concerns, or complaints.    Lloyd Huger, MD   11/20/2018 6:33 AM

## 2018-11-17 ENCOUNTER — Other Ambulatory Visit: Payer: Self-pay

## 2018-11-18 ENCOUNTER — Inpatient Hospital Stay: Payer: BLUE CROSS/BLUE SHIELD

## 2018-11-18 ENCOUNTER — Encounter: Payer: Self-pay | Admitting: Oncology

## 2018-11-18 ENCOUNTER — Inpatient Hospital Stay: Payer: BLUE CROSS/BLUE SHIELD | Attending: Oncology | Admitting: Oncology

## 2018-11-18 ENCOUNTER — Other Ambulatory Visit: Payer: Self-pay

## 2018-11-18 VITALS — BP 119/75 | HR 50 | Temp 97.8°F | Resp 22 | Ht 63.0 in | Wt 219.0 lb

## 2018-11-18 VITALS — Resp 20

## 2018-11-18 DIAGNOSIS — N186 End stage renal disease: Secondary | ICD-10-CM | POA: Insufficient documentation

## 2018-11-18 DIAGNOSIS — F429 Obsessive-compulsive disorder, unspecified: Secondary | ICD-10-CM | POA: Diagnosis not present

## 2018-11-18 DIAGNOSIS — N019 Rapidly progressive nephritic syndrome with unspecified morphologic changes: Secondary | ICD-10-CM | POA: Diagnosis not present

## 2018-11-18 DIAGNOSIS — I10 Essential (primary) hypertension: Secondary | ICD-10-CM | POA: Insufficient documentation

## 2018-11-18 DIAGNOSIS — Z5111 Encounter for antineoplastic chemotherapy: Secondary | ICD-10-CM | POA: Insufficient documentation

## 2018-11-18 DIAGNOSIS — Z79899 Other long term (current) drug therapy: Secondary | ICD-10-CM | POA: Diagnosis not present

## 2018-11-18 DIAGNOSIS — D649 Anemia, unspecified: Secondary | ICD-10-CM

## 2018-11-18 LAB — COMPREHENSIVE METABOLIC PANEL
ALT: 19 U/L (ref 0–44)
AST: 25 U/L (ref 15–41)
Albumin: 4 g/dL (ref 3.5–5.0)
Alkaline Phosphatase: 187 U/L — ABNORMAL HIGH (ref 38–126)
Anion gap: 12 (ref 5–15)
BUN: 55 mg/dL — ABNORMAL HIGH (ref 6–20)
CO2: 26 mmol/L (ref 22–32)
Calcium: 9.5 mg/dL (ref 8.9–10.3)
Chloride: 100 mmol/L (ref 98–111)
Creatinine, Ser: 6.92 mg/dL — ABNORMAL HIGH (ref 0.44–1.00)
GFR calc Af Amer: 7 mL/min — ABNORMAL LOW (ref >60)
GFR calc non Af Amer: 6 mL/min — ABNORMAL LOW (ref >60)
Glucose, Bld: 155 mg/dL — ABNORMAL HIGH (ref 70–99)
Potassium: 4.3 mmol/L (ref 3.5–5.1)
Sodium: 138 mmol/L (ref 135–145)
Total Bilirubin: 0.4 mg/dL (ref 0.3–1.2)
Total Protein: 7.4 g/dL (ref 6.5–8.1)

## 2018-11-18 LAB — CBC WITH DIFFERENTIAL/PLATELET
Abs Immature Granulocytes: 0.05 10*3/uL (ref 0.00–0.07)
Basophils Absolute: 0.1 10*3/uL (ref 0.0–0.1)
Basophils Relative: 1 %
Eosinophils Absolute: 0.3 10*3/uL (ref 0.0–0.5)
Eosinophils Relative: 4 %
HCT: 32.4 % — ABNORMAL LOW (ref 36.0–46.0)
Hemoglobin: 10.2 g/dL — ABNORMAL LOW (ref 12.0–15.0)
Immature Granulocytes: 1 %
Lymphocytes Relative: 16 %
Lymphs Abs: 1.3 10*3/uL (ref 0.7–4.0)
MCH: 28.7 pg (ref 26.0–34.0)
MCHC: 31.5 g/dL (ref 30.0–36.0)
MCV: 91.3 fL (ref 80.0–100.0)
Monocytes Absolute: 0.8 10*3/uL (ref 0.1–1.0)
Monocytes Relative: 10 %
Neutro Abs: 5.5 10*3/uL (ref 1.7–7.7)
Neutrophils Relative %: 68 %
Platelets: 249 10*3/uL (ref 150–400)
RBC: 3.55 MIL/uL — ABNORMAL LOW (ref 3.87–5.11)
RDW: 15 % (ref 11.5–15.5)
WBC: 8 10*3/uL (ref 4.0–10.5)
nRBC: 0 % (ref 0.0–0.2)

## 2018-11-18 MED ORDER — PALONOSETRON HCL INJECTION 0.25 MG/5ML
0.2500 mg | Freq: Once | INTRAVENOUS | Status: AC
  Administered 2018-11-18: 0.25 mg via INTRAVENOUS
  Filled 2018-11-18: qty 5

## 2018-11-18 MED ORDER — SODIUM CHLORIDE 0.9 % IV SOLN
Freq: Once | INTRAVENOUS | Status: AC
  Administered 2018-11-18: 11:00:00 via INTRAVENOUS
  Filled 2018-11-18: qty 250

## 2018-11-18 MED ORDER — SODIUM CHLORIDE 0.9 % IV SOLN
800.0000 mg | Freq: Once | INTRAVENOUS | Status: AC
  Administered 2018-11-18: 800 mg via INTRAVENOUS
  Filled 2018-11-18: qty 40

## 2018-11-18 MED ORDER — METHYLPREDNISOLONE SODIUM SUCC 125 MG IJ SOLR
60.0000 mg | Freq: Once | INTRAMUSCULAR | Status: AC
  Administered 2018-11-18: 60 mg via INTRAVENOUS
  Filled 2018-11-18: qty 2

## 2018-11-21 ENCOUNTER — Telehealth: Payer: Self-pay

## 2018-11-21 NOTE — Telephone Encounter (Signed)
Patient's appointments were cancelled per physicians. Please read below.

## 2018-11-21 NOTE — Telephone Encounter (Signed)
Error

## 2018-11-21 NOTE — Telephone Encounter (Signed)
-----   Message from Lloyd Huger, MD sent at 11/21/2018  3:31 PM EDT ----- [facility] cancel all future appts.  Let me know if we are needed again.  Thanks!  -Tim ----- Message ----- From: Lavonia Dana, MD Sent: 11/21/2018   3:21 PM EDT To: Lloyd Huger, MD  Just spoke to Dr. Deloria Lair [facility] clinic. Patient is not showing much improvement and she still is requiring hemodialysis. At this time, we think it best if we hold any more cyclophosphamide infusions and monitor her renal function.   Lavonia Dana, MD [facility]

## 2018-11-27 DIAGNOSIS — E8770 Fluid overload, unspecified: Secondary | ICD-10-CM | POA: Insufficient documentation

## 2018-12-16 ENCOUNTER — Ambulatory Visit: Payer: BLUE CROSS/BLUE SHIELD | Admitting: Oncology

## 2018-12-16 ENCOUNTER — Ambulatory Visit: Payer: BLUE CROSS/BLUE SHIELD

## 2018-12-16 ENCOUNTER — Other Ambulatory Visit: Payer: BLUE CROSS/BLUE SHIELD

## 2018-12-30 ENCOUNTER — Ambulatory Visit (INDEPENDENT_AMBULATORY_CARE_PROVIDER_SITE_OTHER): Payer: BC Managed Care – PPO

## 2018-12-30 ENCOUNTER — Other Ambulatory Visit (INDEPENDENT_AMBULATORY_CARE_PROVIDER_SITE_OTHER): Payer: Self-pay | Admitting: Vascular Surgery

## 2018-12-30 ENCOUNTER — Ambulatory Visit (INDEPENDENT_AMBULATORY_CARE_PROVIDER_SITE_OTHER): Payer: BC Managed Care – PPO | Admitting: Nurse Practitioner

## 2018-12-30 ENCOUNTER — Other Ambulatory Visit: Payer: Self-pay

## 2018-12-30 ENCOUNTER — Encounter (INDEPENDENT_AMBULATORY_CARE_PROVIDER_SITE_OTHER): Payer: Self-pay | Admitting: Nurse Practitioner

## 2018-12-30 VITALS — BP 118/75 | HR 82 | Resp 16 | Ht 63.0 in | Wt 215.8 lb

## 2018-12-30 DIAGNOSIS — I10 Essential (primary) hypertension: Secondary | ICD-10-CM

## 2018-12-30 DIAGNOSIS — Z95828 Presence of other vascular implants and grafts: Secondary | ICD-10-CM | POA: Diagnosis not present

## 2018-12-30 DIAGNOSIS — N186 End stage renal disease: Secondary | ICD-10-CM

## 2018-12-30 DIAGNOSIS — E785 Hyperlipidemia, unspecified: Secondary | ICD-10-CM

## 2018-12-30 DIAGNOSIS — N178 Other acute kidney failure: Secondary | ICD-10-CM

## 2018-12-30 DIAGNOSIS — Z79899 Other long term (current) drug therapy: Secondary | ICD-10-CM

## 2018-12-30 NOTE — Progress Notes (Signed)
SUBJECTIVE:  Patient ID: Jennifer Newman, female    DOB: [birth year], 53 y.o.   MRN: [id number] Chief Complaint  Patient presents with  . New Patient (Initial Visit)    ref [facility] for vein mapping    HPI  Jennifer Newman is a 53 y.o. female The patient is seen for evaluation for dialysis access. The patient hasd acute renal failure due to glomerulonephritis in February. Her most recent GFR was 6.  The patient states that her nephrologist indicated that she may or may not have her renal function recover at some point.  The patient is right-handed.  Patient is currently maintained via a PermCath in the right chest.  The patient denies amaurosis fugax or recent TIA symptoms. There are no recent neurological changes noted. The patient denies claudication symptoms or rest pain symptoms. The patient denies history of DVT, PE or superficial thrombophlebitis. The patient denies recent episodes of angina or shortness of breath.   The patient has adequate vascular status for creation of a left radiocephalic AV fistula.  Past Medical History:  Diagnosis Date  . Anxiety   . Bipolar disorder ([place])   . Depression   . Hypertension   . Obsessive-compulsive disorder   . PTSD (post-traumatic stress disorder)   . Tachycardia, unspecified   . Vertigo     Past Surgical History:  Procedure Laterality Date  . DIALYSIS/PERMA CATHETER INSERTION N/A 08/22/2018   Procedure: DIALYSIS temporary catheter INSERTION;  Surgeon: Algernon Huxley, MD;  Location: [place] [facility];  Service: Cardiovascular;  Laterality: N/A;  . DIALYSIS/PERMA CATHETER INSERTION N/A 08/26/2018   Procedure: DIALYSIS/PERMA CATHETER INSERTION;  Surgeon: Algernon Huxley, MD;  Location: [place] [facility];  Service: Cardiovascular;  Laterality: N/A;  . none      Social History   Socioeconomic History  . Marital status: Married    Spouse name: Corene Cornea  . Number of children: 2  . Years of education: Not on file  . Highest  education level: Associate degree: occupational, Hotel manager, or vocational program  Occupational History  . 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 Financial resource strain: Not hard at all  . Food insecurity    Worry: Never true    Inability: Never true  . Transportation needs    Medical: No    Non-medical: No  Tobacco Use  . Smoking status: Never Smoker  . Smokeless tobacco: Never Used  Substance and Sexual Activity  . Alcohol use: Yes    Alcohol/week: 1.0 standard drinks    Types: 1 Shots of liquor per week    Comment: occasionally  . Drug use: Not Currently    Types: Marijuana    Comment: Last used several weeks back   . Sexual activity: Not Currently  Lifestyle  . Physical activity    Days per week: 0 days    Minutes per session: 0 min  . Stress: Rather much  Relationships  . Social Herbalist on phone: Never    Gets together: Never    Attends religious service: Never    Active member of club or organization: No    Attends meetings of clubs or organizations: Never    Relationship status: Married  . Intimate partner violence    Fear of current or ex partner: No    Emotionally abused: No    Physically abused: No    Forced sexual activity: No  Other Topics Concern  . Not on file  Social History Narrative  . Not on file    Family History  Problem Relation Age of Onset  . COPD Neg Hx   . Diabetes Neg Hx   . Hypertension Neg Hx   . CAD Neg Hx     No Known Allergies   Review of Systems   Review of Systems: Negative Unless Checked Constitutional: [] Weight loss  [] Fever  [] Chills Cardiac: [] Chest pain   []  Atrial Fibrillation  [] Palpitations   [] Shortness of breath when laying flat   [] Shortness of breath with exertion. [] Shortness of breath at rest Vascular:  [] Pain in legs with walking   [] Pain in legs with standing [] Pain in legs when laying flat   [] Claudication    [] Pain in feet when  laying flat    [] History of DVT   [] Phlebitis   [] Swelling in legs   [] Varicose veins   [] Non-healing ulcers Pulmonary:   [] Uses home oxygen   [] Productive cough   [] Hemoptysis   [] Wheeze  [] COPD   [] Asthma Neurologic:  [] Dizziness   [] Seizures  [] Blackouts [] History of stroke   [] History of TIA  [] Aphasia   [] Temporary Blindness   [] Weakness or numbness in arm   [] Weakness or numbness in leg Musculoskeletal:   [] Joint swelling   [] Joint pain   [] Low back pain  []  History of Knee Replacement [] Arthritis [] back Surgeries  []  Spinal Stenosis    Hematologic:  [] Easy bruising  [] Easy bleeding   [] Hypercoagulable state   [x] Anemic Gastrointestinal:  [] Diarrhea   [] Vomiting  [] Gastroesophageal reflux/heartburn   [] Difficulty swallowing. [] Abdominal pain Genitourinary:  [x] Chronic kidney disease   [] Difficult urination  [] Anuric   [] Blood in urine [] Frequent urination  [] Burning with urination   [] Hematuria Skin:  [] Rashes   [] Ulcers [] Wounds Psychological:  [x] History of anxiety   [x]  History of major depression  []  Memory Difficulties      OBJECTIVE:   Physical Exam  BP 118/75 (BP Location: Right Arm)   Pulse 82   Resp 16   Ht 5\' 3"  (1.6 m)   Wt 215 lb 12.8 oz (97.9 kg)   LMP 04/09/2018 (Approximate)   BMI 38.23 kg/m   Gen: WD/WN, NAD Head: [place]/AT, No temporalis wasting.  Ear/Nose/Throat: Hearing grossly intact, nares w/o erythema or drainage Eyes: PER, EOMI, sclera nonicteric.  Neck: Supple, no masses.  No JVD.  Pulmonary:  Good air movement, no use of accessory muscles.  Cardiac: RRR Vascular:  Vessel Right Left  Radial Palpable Palpable   Gastrointestinal: soft, non-distended. No guarding/no peritoneal signs.  Musculoskeletal: M/S 5/5 throughout.  No deformity or atrophy.  Neurologic: Pain and light touch intact in extremities.  Symmetrical.  Speech is fluent. Motor exam as listed above. Psychiatric: Judgment intact, Mood & affect appropriate for pt's clinical situation.  Dermatologic: No Venous rashes. No Ulcers Noted.  No changes consistent with cellulitis. Lymph : No Cervical lymphadenopathy, no lichenification or skin changes of chronic lymphedema.       ASSESSMENT AND PLAN:  1. Acute renal failure with other specified pathological lesion in kidney [facility]) Recommend:  At this time the patient does not have appropriate extremity access for dialysis . The patient has adequate vascular status for creation of a left radiocephalic AV fistula.  The risks, benefits and alternative therapies were reviewed in detail with the patient.  All questions were answered.  The patient agrees to proceed with surgery.    2. Hyperlipidemia, unspecified hyperlipidemia type Continue statin  as ordered and reviewed, no changes at this time   3. Essential hypertension Continue antihypertensive medications as already ordered, these medications have been reviewed and there are no changes at this time.    Current Outpatient Medications on File Prior to Visit  Medication Sig Dispense Refill  . amLODipine (NORVASC) 10 MG tablet Take 1 tablet by mouth 1 day or 1 dose.    . butalbital-acetaminophen-caffeine (FIORICET, ESGIC) 50-325-40 MG tablet Take 1 tablet by mouth every 6 (six) hours as needed for headache. 14 tablet 0  . carvedilol (COREG) 25 MG tablet Take 1 tablet by mouth 2 (two) times daily.    . clonazePAM (KLONOPIN) 0.5 MG tablet Take 1 tablet by mouth 1 day or 1 dose.    . fluticasone (FLONASE) 50 MCG/ACT nasal spray Place 1 spray into both nostrils 2 (two) times daily.    . furosemide (LASIX) 40 MG tablet Take 40 mg by mouth daily.    Marland Kitchen losartan (COZAAR) 100 MG tablet Take 1 tablet by mouth every evening.    . nortriptyline (PAMELOR) 10 MG capsule Take 1 capsule by mouth at bedtime.    . prochlorperazine (COMPAZINE) 10 MG tablet Take 1 tablet (10 mg total) by mouth every 6 (six) hours as needed for nausea or vomiting. 60 tablet 1  . sertraline (ZOLOFT) 25 MG tablet  Take 1 tablet by mouth 1 day or 1 dose.    . sevelamer carbonate (RENVELA) 800 MG tablet Take 1 tablet by mouth 3 (three) times daily after meals.    Marland Kitchen sulfamethoxazole-trimethoprim (BACTRIM,SEPTRA) 200-40 MG/5ML suspension Take 10 mLs by mouth daily. 100 mL 0  . Vitamin D, Ergocalciferol, (DRISDOL) 1.25 MG (50000 UT) CAPS capsule Take 1 capsule by mouth once a week.    . isosorbide mononitrate (IMDUR) 30 MG 24 hr tablet Take 1 tablet by mouth 1 day or 1 dose.    Marland Kitchen oxymetazoline (AFRIN) 0.05 % nasal spray Place 6 sprays into both nostrils 2 (two) times daily as needed for congestion (only use with active nose bleeding). (Patient not taking: Reported on 12/30/2018) 30 mL 0   No current facility-administered medications on file prior to visit.     There are no Patient Instructions on file for this visit. No follow-ups on file.   Kris Hartmann, NP  This note was completed with Sales executive.  Any errors are purely unintentional.

## 2018-12-31 ENCOUNTER — Other Ambulatory Visit
Admission: RE | Admit: 2018-12-31 | Discharge: 2018-12-31 | Disposition: A | Discharge status: Home / Self Care | Payer: BC Managed Care – PPO | Source: Ambulatory Visit | Attending: Vascular Surgery | Admitting: Vascular Surgery

## 2018-12-31 ENCOUNTER — Telehealth (INDEPENDENT_AMBULATORY_CARE_PROVIDER_SITE_OTHER): Payer: Self-pay

## 2018-12-31 ENCOUNTER — Other Ambulatory Visit (INDEPENDENT_AMBULATORY_CARE_PROVIDER_SITE_OTHER): Payer: Self-pay | Admitting: Nurse Practitioner

## 2018-12-31 ENCOUNTER — Encounter (INDEPENDENT_AMBULATORY_CARE_PROVIDER_SITE_OTHER): Payer: Self-pay

## 2018-12-31 DIAGNOSIS — Z01812 Encounter for preprocedural laboratory examination: Secondary | ICD-10-CM | POA: Insufficient documentation

## 2018-12-31 DIAGNOSIS — Z1159 Encounter for screening for other viral diseases: Secondary | ICD-10-CM | POA: Insufficient documentation

## 2018-12-31 LAB — SARS CORONAVIRUS 2 BY RT PCR (HOSPITAL ORDER, PERFORMED IN ~~LOC~~ HOSPITAL LAB): SARS Coronavirus 2: NEGATIVE

## 2018-12-31 MED ORDER — CEFAZOLIN SODIUM-DEXTROSE 1-4 GM/50ML-% IV SOLN
1.0000 g | Freq: Once | INTRAVENOUS | Status: AC
  Administered 2019-01-01: 1 g via INTRAVENOUS

## 2018-12-31 NOTE — Telephone Encounter (Signed)
[facility] wanted the patient to be scheduled for a permcath exchange. Patient is scheduled with Dr. Lucky Cowboy for 01/01/2019 with a 7:45 am arrival time. She will do Covid testing today ( 12/31/2018 ) between 12:30-2:30 pm at the [facility]. This information as well as zero visitor policy was faxed to [facility] for the patient. Per Sharrie Rothman she would talk with the patient about this information.

## 2019-01-01 ENCOUNTER — Ambulatory Visit
Admission: RE | Admit: 2019-01-01 | Discharge: 2019-01-01 | Disposition: A | Discharge status: Home / Self Care | Payer: BC Managed Care – PPO | Attending: Vascular Surgery | Admitting: Vascular Surgery

## 2019-01-01 ENCOUNTER — Telehealth (INDEPENDENT_AMBULATORY_CARE_PROVIDER_SITE_OTHER): Payer: Self-pay

## 2019-01-01 ENCOUNTER — Encounter
Admission: RE | Disposition: A | Discharge status: Home / Self Care | Payer: Self-pay | Source: Home / Self Care | Attending: Vascular Surgery

## 2019-01-01 ENCOUNTER — Other Ambulatory Visit: Payer: Self-pay

## 2019-01-01 DIAGNOSIS — F419 Anxiety disorder, unspecified: Secondary | ICD-10-CM | POA: Insufficient documentation

## 2019-01-01 DIAGNOSIS — I120 Hypertensive chronic kidney disease with stage 5 chronic kidney disease or end stage renal disease: Secondary | ICD-10-CM | POA: Diagnosis not present

## 2019-01-01 DIAGNOSIS — Z79899 Other long term (current) drug therapy: Secondary | ICD-10-CM | POA: Diagnosis not present

## 2019-01-01 DIAGNOSIS — T82898A Other specified complication of vascular prosthetic devices, implants and grafts, initial encounter: Secondary | ICD-10-CM | POA: Diagnosis not present

## 2019-01-01 DIAGNOSIS — N186 End stage renal disease: Secondary | ICD-10-CM

## 2019-01-01 DIAGNOSIS — E785 Hyperlipidemia, unspecified: Secondary | ICD-10-CM | POA: Diagnosis not present

## 2019-01-01 DIAGNOSIS — F329 Major depressive disorder, single episode, unspecified: Secondary | ICD-10-CM | POA: Diagnosis not present

## 2019-01-01 DIAGNOSIS — Z992 Dependence on renal dialysis: Secondary | ICD-10-CM | POA: Diagnosis not present

## 2019-01-01 HISTORY — PX: DIALYSIS/PERMA CATHETER INSERTION: CATH118288

## 2019-01-01 LAB — POTASSIUM (ARMC VASCULAR LAB ONLY): Potassium (ARMC vascular lab): 4.5 (ref 3.5–5.1)

## 2019-01-01 SURGERY — DIALYSIS/PERMA CATHETER INSERTION
Anesthesia: Moderate Sedation

## 2019-01-01 MED ORDER — HEPARIN SODIUM (PORCINE) 10000 UNIT/ML IJ SOLN
INTRAMUSCULAR | Status: AC
  Filled 2019-01-01: qty 1

## 2019-01-01 MED ORDER — FAMOTIDINE 20 MG PO TABS: 40.0000 mg | ORAL_TABLET | Freq: Once | ORAL | Status: DC | PRN

## 2019-01-01 MED ORDER — ONDANSETRON HCL 4 MG/2ML IJ SOLN: 4.0000 mg | Freq: Four times a day (QID) | INTRAMUSCULAR | Status: DC | PRN

## 2019-01-01 MED ORDER — FENTANYL CITRATE (PF) 100 MCG/2ML IJ SOLN
INTRAMUSCULAR | Status: AC
  Filled 2019-01-01: qty 2

## 2019-01-01 MED ORDER — HYDROMORPHONE HCL 1 MG/ML IJ SOLN: 1.0000 mg | Freq: Once | INTRAMUSCULAR | Status: DC | PRN

## 2019-01-01 MED ORDER — SODIUM CHLORIDE 0.9 % IV SOLN: INTRAVENOUS | Status: DC

## 2019-01-01 MED ORDER — MIDAZOLAM HCL 2 MG/ML PO SYRP: 8.0000 mg | ORAL_SOLUTION | Freq: Once | ORAL | Status: DC | PRN

## 2019-01-01 MED ORDER — MIDAZOLAM HCL 2 MG/2ML IJ SOLN
INTRAMUSCULAR | Status: DC | PRN
  Administered 2019-01-01: 2 mg via INTRAVENOUS

## 2019-01-01 MED ORDER — CEFAZOLIN SODIUM-DEXTROSE 1-4 GM/50ML-% IV SOLN
INTRAVENOUS | Status: AC
  Filled 2019-01-01: qty 50

## 2019-01-01 MED ORDER — METHYLPREDNISOLONE SODIUM SUCC 125 MG IJ SOLR: 125.0000 mg | Freq: Once | INTRAMUSCULAR | Status: DC | PRN

## 2019-01-01 MED ORDER — FENTANYL CITRATE (PF) 100 MCG/2ML IJ SOLN
INTRAMUSCULAR | Status: DC | PRN
  Administered 2019-01-01: 25 ug via INTRAVENOUS
  Administered 2019-01-01: 50 ug via INTRAVENOUS
  Administered 2019-01-01: 25 ug via INTRAVENOUS

## 2019-01-01 MED ORDER — DIPHENHYDRAMINE HCL 50 MG/ML IJ SOLN: 50.0000 mg | Freq: Once | INTRAMUSCULAR | Status: DC | PRN

## 2019-01-01 MED ORDER — HEPARIN (PORCINE) IN NACL 1000-0.9 UT/500ML-% IV SOLN
INTRAVENOUS | Status: AC
  Filled 2019-01-01: qty 500

## 2019-01-01 MED ORDER — HEPARIN SODIUM (PORCINE) 1000 UNIT/ML IJ SOLN
INTRAMUSCULAR | Status: AC
  Filled 2019-01-01: qty 1

## 2019-01-01 MED ORDER — LIDOCAINE-EPINEPHRINE (PF) 1 %-1:200000 IJ SOLN
INTRAMUSCULAR | Status: AC
  Filled 2019-01-01: qty 30

## 2019-01-01 MED ORDER — MIDAZOLAM HCL 5 MG/5ML IJ SOLN
INTRAMUSCULAR | Status: AC
  Filled 2019-01-01: qty 5

## 2019-01-01 SURGICAL SUPPLY — 9 items
ADH SKN CLS APL DERMABOND .7 (GAUZE/BANDAGES/DRESSINGS) ×1
CATH PALINDROME-P 19CM W/VT (CATHETERS) ×1 IMPLANT
DERMABOND ADVANCED (GAUZE/BANDAGES/DRESSINGS) ×1
DERMABOND ADVANCED .7 DNX12 (GAUZE/BANDAGES/DRESSINGS) IMPLANT
GUIDEWIRE SUPER STIFF .035X180 (WIRE) ×1 IMPLANT
PACK ANGIOGRAPHY (CUSTOM PROCEDURE TRAY) ×1 IMPLANT
SUT MNCRL AB 4-0 PS2 18 (SUTURE) ×1 IMPLANT
SUT PROLENE 0 CT 1 30 (SUTURE) ×1 IMPLANT
WIRE J 3MM .035X145CM (WIRE) IMPLANT

## 2019-01-01 NOTE — Discharge Instructions (Signed)
Moderate Conscious Sedation, Adult, Care After These instructions provide you with information about caring for yourself after your procedure. Your health care provider may also give you more specific instructions. Your treatment has been planned according to current medical practices, but problems sometimes occur. Call your health care provider if you have any problems or questions after your procedure. What can I expect after the procedure? After your procedure, it is common:  To feel sleepy for several hours.  To feel clumsy and have poor balance for several hours.  To have poor judgment for several hours.  To vomit if you eat too soon. Follow these instructions at home: For at least 24 hours after the procedure:   Do not: ? Participate in activities where you could fall or become injured. ? Drive. ? Use heavy machinery. ? Drink alcohol. ? Take sleeping pills or medicines that cause drowsiness. ? Make important decisions or sign legal documents. ? Take care of children on your own.  Rest. Eating and drinking  Follow the diet recommended by your health care provider.  If you vomit: ? Drink water, juice, or soup when you can drink without vomiting. ? Make sure you have little or no nausea before eating solid foods. General instructions  Have a responsible adult stay with you until you are awake and alert.  Take over-the-counter and prescription medicines only as told by your health care provider.  If you smoke, do not smoke without supervision.  Keep all follow-up visits as told by your health care provider. This is important. Contact a health care provider if:  You keep feeling nauseous or you keep vomiting.  You feel light-headed.  You develop a rash.  You have a fever. Get help right away if:  You have trouble breathing. This information is not intended to replace advice given to you by your health care provider. Make sure you discuss any questions you have  with your health care provider. Document Released: 04/09/2013 Document Revised: 06/01/2017 Document Reviewed: 10/09/2015 Elsevier Patient Education  2020 [employer], Care After This sheet gives you information about how to care for yourself after your procedure. Your health care provider may also give you more specific instructions. If you have problems or questions, contact your health care provider. What can I expect after the procedure? After the procedure, it is common to have:  Some mild redness, bruising, swelling, and pain around your catheter site.  A small amount of blood or clear fluid coming from your incisions. Follow these instructions at home: Incision care   Follow instructions from your health care provider about how to take care of your incisions. Make sure you: ? Wash your hands with soap and water before and after you change your bandages (dressings). If soap and water are not available, use hand sanitizer. ? Change your dressings as told by your health care provider. Wash the area around your incisions with a germ-killing (antiseptic) solution when you change your dressings. ? Leave stitches (sutures), skin glue, or adhesive strips in place. These skin closures may need to stay in place for 2 weeks or longer. If adhesive strip edges start to loosen and curl up, you may trim the loose edges. Do not remove adhesive strips completely unless your health care provider tells you to do that.  Keep your dressings clean and dry.  Check your incision areas every day for signs of infection. Check for: ? More redness, swelling, or pain. ? More fluid or blood. ?  Warmth. ? Pus or a bad smell. Catheter care   Wash your hands with soap and water before and after caring for your catheter. If soap and water are not available, use hand sanitizer.  Keep your catheter site clean and dry.  Apply an antibiotic ointment to your catheter site as told by  your health care provider.  Flush your catheter as told by your health care provider. This helps prevent it from becoming clogged.  Do not open the caps on the ends of the catheter.  Do not pull on your catheter. Medicines  Take over-the-counter and prescription medicines only as told by your health care provider.  If you were prescribed an antibiotic medicine, take it as told by your health care provider. Do not stop taking the antibiotic even if you start to feel better. Activity  Return to your normal activities as told by your health care provider. Ask your health care provider what activities are safe for you.  Follow any other activity restrictions as instructed by your health care provider.  Do not lift anything that is heavier than 10 lb (4.5 kg), or the limit that you are told, until your health care provider says that it is safe. Driving  Do not drive until your health care provider approves.  Ask your health care provider if the medicine prescribed to you requires you to avoid driving or using heavy machinery. General instructions  Follow your health care provider's specific instructions for the type of catheter that you have.  Do not take baths, swim, or use a hot tub until your health care provider approves. Ask your health care provider if you may take showers.  Keep all follow-up visits as told by your health care provider. This is important. Contact a health care provider if:  You feel unusually weak or nauseous.  You have more redness, swelling, or pain at your incisions or around the area where your catheter is inserted.  Your catheter is not working properly.  You are unable to flush your catheter. Get help right away if:  Your catheter develops a hole or it breaks.  You have pain or swelling when fluids or medicines are being given through the catheter.  Fluid is leaking from the catheter, under the dressing, or around the dressing.  Your catheter  comes loose or gets pulled completely out. If this happens, press on your catheter site firmly with a clean cloth until you can get medical help.  You have swelling in your shoulder, neck, chest, or face.  You have chest pain or difficulty breathing.  You feel dizzy or light-headed.  You have pus or a bad smell coming from your catheter site.  You have a fever or chills.  Your catheter site feels warm to the touch.  You develop bleeding from your catheter or your insertion site, and your bleeding does not stop. Summary  After the procedure, it is common to have mild redness, swelling, and pain around your catheter site.  Return to your normal activities as told by your health care provider. Ask your health care provider what activities are safe for you.  Follow your health care provider's specific instructions for the type of catheter that you have.  Keep your catheter site and your dressings clean and dry.  Contact a health care provider if your catheter is not working properly. Get help right away if you have chest pain, fever, or difficulty breathing. This information is not intended to replace advice given  to you by your health care provider. Make sure you discuss any questions you have with your health care provider. Document Released: 06/05/2012 Document Revised: 06/11/2018 Document Reviewed: 06/11/2018 Elsevier Patient Education  2020 [employer].

## 2019-01-01 NOTE — Telephone Encounter (Signed)
I attempted to contact the patient to give her the appt time for her cardiac clearance with [facility] of 01/06/2019 @ 3:45 pm. A message was left for a return call.

## 2019-01-01 NOTE — H&P (Signed)
[facility] History & Physical Update  The patient was interviewed and re-examined.  The patient's previous History and Physical has been reviewed and is unchanged.  There is no change in the plan of care. We plan to proceed with the scheduled procedure.  Leotis Pain, MD  01/01/2019, 8:10 AM

## 2019-01-01 NOTE — Op Note (Signed)
OPERATIVE NOTE    PRE-OPERATIVE DIAGNOSIS: 1. ESRD 2. Non-functional permcath  POST-OPERATIVE DIAGNOSIS: same as above  PROCEDURE: 1. Fluoroscopic guidance for placement of catheter 2. Placement of a 19 cm tip to cuff tunneled hemodialysis catheter via the right internal jugular vein and removal of previous catheter  SURGEON: Leotis Pain, MD  ANESTHESIA:  Local with moderate conscious sedation for 15 minutes using 3 mg of Versed and 75 mcg of Fentanyl  ESTIMATED BLOOD LOSS: 3 cc  FINDING(S): none  SPECIMEN(S):  None  INDICATIONS:   Patient is a 53 y.o.female who presents with non-functional dialysis catheter and ESRD.  The patient needs long term dialysis access for their ESRD, and a Permcath is necessary.  Risks and benefits are discussed and informed consent is obtained.    DESCRIPTION: After obtaining full informed written consent, the patient was brought back to the vascular suite. The patient received moderate conscious sedation during a face-to-face encounter with me present throughout the entire procedure and supervising the RN monitoring the vital signs, pulse oximetry, telemetry, and mental status throughout the entire procedure. The patient's existing catheter, right neck and chest were sterilely prepped and draped in a sterile surgical field was created.  The existing catheter was dissected free from the fibrous sheath securing the cuff with hemostats and blunt dissection.  A wire was placed. The existing catheter was then removed and the wire used to keep venous access. I selected a 19 cm tip to cuff tunneled dialysis catheter.  Using fluoroscopic guidance the catheter tips were parked in the right atrium. The appropriate distal connectors were placed. It withdrew blood well and flushed easily with heparinized saline and a concentrated heparin solution was then placed. It was secured to the chest wall with 2 Prolene sutures. A 4-0 Monocryl pursestring suture was placed around  the exit site. Sterile dressings were placed. The patient tolerated the procedure well and was taken to the recovery room in stable condition.  COMPLICATIONS: None  CONDITION: Stable  Leotis Pain 01/01/2019 8:59 AM   This note was created with Dragon Medical transcription system. Any errors in dictation are purely unintentional.

## 2019-01-06 NOTE — Telephone Encounter (Signed)
A message was left for a return call to schedule surgery.

## 2019-01-08 NOTE — Telephone Encounter (Signed)
I received the cardiac clearance for the patient on 01/01/2019 and I have attempted to contact the patient since then. Per the patient she is having a stress test on 01/17/2019 and will wait until after that to call our office and schedule her surgery.

## 2019-01-27 ENCOUNTER — Telehealth (INDEPENDENT_AMBULATORY_CARE_PROVIDER_SITE_OTHER): Payer: Self-pay

## 2019-01-27 NOTE — Telephone Encounter (Signed)
Spoke with the patient and she is now scheduled with Dr.Dew for surgery on 02/05/2019 and will do Covid testing and pre-op on 01/31/2019 at 1:30 pm. This information will be mailed to the patient.

## 2019-01-28 ENCOUNTER — Other Ambulatory Visit (INDEPENDENT_AMBULATORY_CARE_PROVIDER_SITE_OTHER): Payer: Self-pay | Admitting: Nurse Practitioner

## 2019-01-31 ENCOUNTER — Other Ambulatory Visit: Payer: Self-pay

## 2019-01-31 ENCOUNTER — Encounter
Admission: RE | Admit: 2019-01-31 | Discharge: 2019-01-31 | Disposition: A | Discharge status: Home / Self Care | Payer: BC Managed Care – PPO | Source: Ambulatory Visit | Attending: Vascular Surgery | Admitting: Vascular Surgery

## 2019-01-31 DIAGNOSIS — R9431 Abnormal electrocardiogram [ECG] [EKG]: Secondary | ICD-10-CM | POA: Insufficient documentation

## 2019-01-31 DIAGNOSIS — Z01818 Encounter for other preprocedural examination: Secondary | ICD-10-CM | POA: Diagnosis not present

## 2019-01-31 DIAGNOSIS — Z01810 Encounter for preprocedural cardiovascular examination: Secondary | ICD-10-CM | POA: Diagnosis not present

## 2019-01-31 HISTORY — DX: Chronic kidney disease, unspecified: N18.9

## 2019-01-31 HISTORY — DX: Gastro-esophageal reflux disease without esophagitis: K21.9

## 2019-01-31 HISTORY — DX: Cardiac arrhythmia, unspecified: I49.9

## 2019-01-31 LAB — APTT: aPTT: 28 seconds (ref 24–36)

## 2019-01-31 LAB — CBC WITH DIFFERENTIAL/PLATELET
Abs Immature Granulocytes: 0.03 10*3/uL (ref 0.00–0.07)
Basophils Absolute: 0.1 10*3/uL (ref 0.0–0.1)
Basophils Relative: 1 %
Eosinophils Absolute: 0.2 10*3/uL (ref 0.0–0.5)
Eosinophils Relative: 3 %
HCT: 32.7 % — ABNORMAL LOW (ref 36.0–46.0)
Hemoglobin: 10.1 g/dL — ABNORMAL LOW (ref 12.0–15.0)
Immature Granulocytes: 0 %
Lymphocytes Relative: 19 %
Lymphs Abs: 1.4 10*3/uL (ref 0.7–4.0)
MCH: 24.3 pg — ABNORMAL LOW (ref 26.0–34.0)
MCHC: 30.9 g/dL (ref 30.0–36.0)
MCV: 78.8 fL — ABNORMAL LOW (ref 80.0–100.0)
Monocytes Absolute: 1.1 10*3/uL — ABNORMAL HIGH (ref 0.1–1.0)
Monocytes Relative: 15 %
Neutro Abs: 4.6 10*3/uL (ref 1.7–7.7)
Neutrophils Relative %: 62 %
Platelets: 301 10*3/uL (ref 150–400)
RBC: 4.15 MIL/uL (ref 3.87–5.11)
RDW: 17.2 % — ABNORMAL HIGH (ref 11.5–15.5)
WBC: 7.4 10*3/uL (ref 4.0–10.5)
nRBC: 0 % (ref 0.0–0.2)

## 2019-01-31 LAB — BASIC METABOLIC PANEL
Anion gap: 12 (ref 5–15)
BUN: 32 mg/dL — ABNORMAL HIGH (ref 6–20)
CO2: 27 mmol/L (ref 22–32)
Calcium: 9.6 mg/dL (ref 8.9–10.3)
Chloride: 97 mmol/L — ABNORMAL LOW (ref 98–111)
Creatinine, Ser: 4.19 mg/dL — ABNORMAL HIGH (ref 0.44–1.00)
GFR calc Af Amer: 13 mL/min — ABNORMAL LOW (ref >60)
GFR calc non Af Amer: 12 mL/min — ABNORMAL LOW (ref >60)
Glucose, Bld: 112 mg/dL — ABNORMAL HIGH (ref 70–99)
Potassium: 3.3 mmol/L — ABNORMAL LOW (ref 3.5–5.1)
Sodium: 136 mmol/L (ref 135–145)

## 2019-01-31 LAB — PROTIME-INR
INR: 1 (ref 0.8–1.2)
Prothrombin Time: 12.8 seconds (ref 11.4–15.2)

## 2019-01-31 LAB — TYPE AND SCREEN
ABO/RH(D): A POS
Antibody Screen: NEGATIVE

## 2019-01-31 NOTE — Patient Instructions (Signed)
Your procedure is scheduled on: Wed. 8/5 Report to Day Surgery. To find out your arrival time please call [phone] between 1PM - 3PM on Tues 8/4.  Remember: Instructions that are not followed completely may result in serious medical risk,  up to and including death, or upon the discretion of your surgeon and anesthesiologist your  surgery may need to be rescheduled.     _X__ 1. Do not eat food after midnight the night before your procedure.                 No gum chewing or hard candies. You may drink clear liquids up to 2 hours                 before you are scheduled to arrive for your surgery- DO not drink clear                 liquids within 2 hours of the start of your surgery.                 Clear Liquids include:  water, apple juice without pulp, clear carbohydrate                 drink such as Clearfast of Gatorade, Black Coffee or Tea (Do not add                 anything to coffee or tea).  __X__2.  On the morning of surgery brush your teeth with toothpaste and water, you                may rinse your mouth with mouthwash if you wish.  Do not swallow any toothpaste of mouthwash.     ___ 3.  No Alcohol for 24 hours before or after surgery.   __ 4.  Do Not Smoke or use e-cigarettes For 24 Hours Prior to Your Surgery.                 Do not use any chewable tobacco products for at least 6 hours prior to                 surgery.  ____  5.  Bring all medications with you on the day of surgery if instructed.   __x__  6.  Notify your doctor if there is any change in your medical condition      (cold, fever, infections).     Do not wear jewelry, make-up, hairpins, clips or nail polish. Do not wear lotions, powders, or perfumes. You may wear deodorant. Do not shave 48 hours prior to surgery. Men may shave face and neck. Do not bring valuables to the hospital.    [facility] is not responsible for any belongings or valuables.  Contacts, dentures or  bridgework may not be worn into surgery. Leave your suitcase in the car. After surgery it may be brought to your room. For patients admitted to the hospital, discharge time is determined by your treatment team.   Patients discharged the day of surgery will not be allowed to drive home.   Please read over the following fact sheets that you were given:    _x___ Take these medicines the morning of surgery with A SIP OF WATER:    1. amLODipine (NORVASC) 10 MG tablet  2. carvedilol (COREG) 25 MG tablet  3. clonazePAM (KLONOPIN) 0.5 MG tablet  If needed  4.fluticasone (FLONASE) 50 MCG/ACT nasal spray  5.sertraline (ZOLOFT) 25 MG tablet  6.  ____ Fleet Enema (as directed)   __x__ Use sage wipes as directed  ____ Use inhalers on the day of surgery  ____ Stop metformin 2 days prior to surgery    ____ Take 1/2 of usual insulin dose the night before surgery. No insulin the morning          of surgery.   ____ Stop Coumadin/Plavix/aspirin on   ____ Stop Anti-inflammatories on    ____ Stop supplements until after surgery.    ____ Bring C-Pap to the hospital.

## 2019-01-31 NOTE — Pre-Procedure Instructions (Signed)
Patient asked questions about peritoneal dialysis and said a nurse at the dialysis center gave her information about it.  She is considering doing that instead.  Will call Dr. Bunnie Domino office to discuss and make a decision.  She did not sign her consents today waiting her decision.

## 2019-02-03 NOTE — Telephone Encounter (Signed)
Patient called on 01/31/2019 wanting to cancel her surgery with Dr. Lucky Cowboy because she was given new information and wanted to discuss having a PD cath placed instead of a fistula. Patient has been scheduled to come in and see Dr. Lucky Cowboy to discuss the difference.

## 2019-02-05 ENCOUNTER — Encounter: Admission: RE | Payer: Self-pay | Source: Home / Self Care

## 2019-02-05 ENCOUNTER — Ambulatory Visit
Admission: RE | Admit: 2019-02-05 | Payer: BC Managed Care – PPO | Source: Home / Self Care | Admitting: Vascular Surgery

## 2019-02-05 ENCOUNTER — Telehealth (INDEPENDENT_AMBULATORY_CARE_PROVIDER_SITE_OTHER): Payer: Self-pay

## 2019-02-05 SURGERY — ARTERIOVENOUS (AV) FISTULA CREATION
Anesthesia: General | Laterality: Left

## 2019-02-05 NOTE — Telephone Encounter (Signed)
A message was left for the patient stated she had been rescheduled and information will be mailed to her. Patient has been rescheduled for surgery with Dr. Lucky Cowboy to 8/26/200 and will do her Covid-19 test on 02/21/2019 between 12:30-2:30 pm.

## 2019-02-07 ENCOUNTER — Ambulatory Visit (INDEPENDENT_AMBULATORY_CARE_PROVIDER_SITE_OTHER): Payer: BC Managed Care – PPO | Admitting: Vascular Surgery

## 2019-02-20 ENCOUNTER — Other Ambulatory Visit (INDEPENDENT_AMBULATORY_CARE_PROVIDER_SITE_OTHER): Payer: Self-pay | Admitting: Nurse Practitioner

## 2019-02-21 ENCOUNTER — Other Ambulatory Visit: Payer: Self-pay

## 2019-02-21 ENCOUNTER — Other Ambulatory Visit
Admission: RE | Admit: 2019-02-21 | Discharge: 2019-02-21 | Disposition: A | Discharge status: Home / Self Care | Payer: BC Managed Care – PPO | Source: Ambulatory Visit | Attending: Vascular Surgery | Admitting: Vascular Surgery

## 2019-02-21 DIAGNOSIS — Z01812 Encounter for preprocedural laboratory examination: Secondary | ICD-10-CM | POA: Insufficient documentation

## 2019-02-21 DIAGNOSIS — Z20828 Contact with and (suspected) exposure to other viral communicable diseases: Secondary | ICD-10-CM | POA: Diagnosis not present

## 2019-02-21 LAB — SARS CORONAVIRUS 2 (TAT 6-24 HRS): SARS Coronavirus 2: NEGATIVE

## 2019-02-25 MED ORDER — CEFAZOLIN SODIUM-DEXTROSE 1-4 GM/50ML-% IV SOLN
1.0000 g | INTRAVENOUS | Status: AC
  Administered 2019-02-26: 10:00:00 1 g via INTRAVENOUS

## 2019-02-26 ENCOUNTER — Ambulatory Visit: Payer: BC Managed Care – PPO | Admitting: Anesthesiology

## 2019-02-26 ENCOUNTER — Ambulatory Visit
Admission: RE | Admit: 2019-02-26 | Discharge: 2019-02-26 | Disposition: A | Discharge status: Home / Self Care | Payer: BC Managed Care – PPO | Attending: Vascular Surgery | Admitting: Vascular Surgery

## 2019-02-26 ENCOUNTER — Encounter
Admission: RE | Disposition: A | Discharge status: Home / Self Care | Payer: Self-pay | Source: Home / Self Care | Attending: Vascular Surgery

## 2019-02-26 ENCOUNTER — Ambulatory Visit: Payer: BC Managed Care – PPO

## 2019-02-26 ENCOUNTER — Encounter: Payer: Self-pay | Admitting: *Deleted

## 2019-02-26 ENCOUNTER — Other Ambulatory Visit: Payer: Self-pay

## 2019-02-26 DIAGNOSIS — Z992 Dependence on renal dialysis: Secondary | ICD-10-CM | POA: Diagnosis not present

## 2019-02-26 DIAGNOSIS — I7090 Unspecified atherosclerosis: Secondary | ICD-10-CM | POA: Diagnosis not present

## 2019-02-26 DIAGNOSIS — N185 Chronic kidney disease, stage 5: Secondary | ICD-10-CM | POA: Diagnosis not present

## 2019-02-26 DIAGNOSIS — N186 End stage renal disease: Secondary | ICD-10-CM | POA: Insufficient documentation

## 2019-02-26 DIAGNOSIS — F319 Bipolar disorder, unspecified: Secondary | ICD-10-CM | POA: Diagnosis not present

## 2019-02-26 DIAGNOSIS — I120 Hypertensive chronic kidney disease with stage 5 chronic kidney disease or end stage renal disease: Secondary | ICD-10-CM | POA: Diagnosis present

## 2019-02-26 HISTORY — PX: AV FISTULA PLACEMENT: SHX1204

## 2019-02-26 LAB — CBC WITH DIFFERENTIAL/PLATELET
Abs Immature Granulocytes: 0.02 10*3/uL (ref 0.00–0.07)
Basophils Absolute: 0.1 10*3/uL (ref 0.0–0.1)
Basophils Relative: 1 %
Eosinophils Absolute: 0.2 10*3/uL (ref 0.0–0.5)
Eosinophils Relative: 3 %
HCT: 35.8 % — ABNORMAL LOW (ref 36.0–46.0)
Hemoglobin: 11.1 g/dL — ABNORMAL LOW (ref 12.0–15.0)
Immature Granulocytes: 0 %
Lymphocytes Relative: 21 %
Lymphs Abs: 1.4 10*3/uL (ref 0.7–4.0)
MCH: 26.7 pg (ref 26.0–34.0)
MCHC: 31 g/dL (ref 30.0–36.0)
MCV: 86.1 fL (ref 80.0–100.0)
Monocytes Absolute: 0.8 10*3/uL (ref 0.1–1.0)
Monocytes Relative: 13 %
Neutro Abs: 4 10*3/uL (ref 1.7–7.7)
Neutrophils Relative %: 62 %
Platelets: 279 10*3/uL (ref 150–400)
RBC: 4.16 MIL/uL (ref 3.87–5.11)
RDW: 24.5 % — ABNORMAL HIGH (ref 11.5–15.5)
WBC: 6.5 10*3/uL (ref 4.0–10.5)
nRBC: 0 % (ref 0.0–0.2)

## 2019-02-26 LAB — URINE DRUG SCREEN, QUALITATIVE (ARMC ONLY)
Amphetamines, Ur Screen: NOT DETECTED
Barbiturates, Ur Screen: POSITIVE — AB
Benzodiazepine, Ur Scrn: NOT DETECTED
Cannabinoid 50 Ng, Ur ~~LOC~~: NOT DETECTED
Cocaine Metabolite,Ur ~~LOC~~: NOT DETECTED
MDMA (Ecstasy)Ur Screen: NOT DETECTED
Methadone Scn, Ur: NOT DETECTED
Opiate, Ur Screen: NOT DETECTED
Phencyclidine (PCP) Ur S: NOT DETECTED
Tricyclic, Ur Screen: POSITIVE — AB

## 2019-02-26 LAB — PROTIME-INR
INR: 0.9 (ref 0.8–1.2)
Prothrombin Time: 12.4 seconds (ref 11.4–15.2)

## 2019-02-26 LAB — BASIC METABOLIC PANEL
Anion gap: 12 (ref 5–15)
BUN: 27 mg/dL — ABNORMAL HIGH (ref 6–20)
CO2: 27 mmol/L (ref 22–32)
Calcium: 9.9 mg/dL (ref 8.9–10.3)
Chloride: 100 mmol/L (ref 98–111)
Creatinine, Ser: 3.31 mg/dL — ABNORMAL HIGH (ref 0.44–1.00)
GFR calc Af Amer: 18 mL/min — ABNORMAL LOW (ref >60)
GFR calc non Af Amer: 15 mL/min — ABNORMAL LOW (ref >60)
Glucose, Bld: 107 mg/dL — ABNORMAL HIGH (ref 70–99)
Potassium: 3.9 mmol/L (ref 3.5–5.1)
Sodium: 139 mmol/L (ref 135–145)

## 2019-02-26 LAB — TYPE AND SCREEN
ABO/RH(D): A POS
Antibody Screen: NEGATIVE

## 2019-02-26 LAB — POCT I-STAT 4, (NA,K, GLUC, HGB,HCT)
Glucose, Bld: 107 mg/dL — ABNORMAL HIGH (ref 70–99)
HCT: 38 % (ref 36.0–46.0)
Hemoglobin: 12.9 g/dL (ref 12.0–15.0)
Potassium: 4 mmol/L (ref 3.5–5.1)
Sodium: 137 mmol/L (ref 135–145)

## 2019-02-26 LAB — POCT PREGNANCY, URINE: Preg Test, Ur: NEGATIVE

## 2019-02-26 LAB — GLUCOSE, CAPILLARY: Glucose-Capillary: 94 mg/dL (ref 70–99)

## 2019-02-26 LAB — APTT: aPTT: 27 seconds (ref 24–36)

## 2019-02-26 SURGERY — ARTERIOVENOUS (AV) FISTULA CREATION
Anesthesia: General | Laterality: Left

## 2019-02-26 MED ORDER — SODIUM CHLORIDE 0.9 % IV SOLN
INTRAVENOUS | Status: DC | PRN
  Administered 2019-02-26: 10:00:00 20 ug/min via INTRAVENOUS

## 2019-02-26 MED ORDER — LIDOCAINE HCL (PF) 2 % IJ SOLN
INTRAMUSCULAR | Status: DC | PRN
  Administered 2019-02-26: 140 mg via PERINEURAL
  Administered 2019-02-26: 60 mg via PERINEURAL

## 2019-02-26 MED ORDER — ACETAMINOPHEN 10 MG/ML IV SOLN
INTRAVENOUS | Status: AC
  Filled 2019-02-26: qty 100

## 2019-02-26 MED ORDER — HEPARIN SODIUM (PORCINE) 5000 UNIT/ML IJ SOLN
INTRAMUSCULAR | Status: AC
  Filled 2019-02-26: qty 1

## 2019-02-26 MED ORDER — MIDAZOLAM HCL 2 MG/2ML IJ SOLN
INTRAMUSCULAR | Status: AC
  Administered 2019-02-26: 1 mg via INTRAVENOUS
  Filled 2019-02-26: qty 2

## 2019-02-26 MED ORDER — LIDOCAINE HCL (PF) 1 % IJ SOLN
INTRAMUSCULAR | Status: AC
  Filled 2019-02-26: qty 5

## 2019-02-26 MED ORDER — ESMOLOL HCL 100 MG/10ML IV SOLN
INTRAVENOUS | Status: DC | PRN
  Administered 2019-02-26 (×2): 30 mg via INTRAVENOUS

## 2019-02-26 MED ORDER — FAMOTIDINE 20 MG PO TABS
ORAL_TABLET | ORAL | Status: AC
  Administered 2019-02-26: 20 mg via ORAL
  Filled 2019-02-26: qty 1

## 2019-02-26 MED ORDER — LIDOCAINE HCL (PF) 2 % IJ SOLN
INTRAMUSCULAR | Status: AC
  Filled 2019-02-26: qty 20

## 2019-02-26 MED ORDER — SUGAMMADEX SODIUM 200 MG/2ML IV SOLN
INTRAVENOUS | Status: AC
  Filled 2019-02-26: qty 4

## 2019-02-26 MED ORDER — GLYCOPYRROLATE 0.2 MG/ML IJ SOLN
INTRAMUSCULAR | Status: AC
  Filled 2019-02-26: qty 4

## 2019-02-26 MED ORDER — FENTANYL CITRATE (PF) 100 MCG/2ML IJ SOLN
50.0000 ug | Freq: Once | INTRAMUSCULAR | Status: AC
  Administered 2019-02-26: 10:00:00 50 ug via INTRAVENOUS

## 2019-02-26 MED ORDER — MIDAZOLAM HCL 2 MG/2ML IJ SOLN
1.0000 mg | Freq: Once | INTRAMUSCULAR | Status: AC
  Administered 2019-02-26: 10:00:00 1 mg via INTRAVENOUS

## 2019-02-26 MED ORDER — SODIUM CHLORIDE 0.9 % IV SOLN
INTRAVENOUS | Status: DC
  Administered 2019-02-26: 09:00:00 via INTRAVENOUS

## 2019-02-26 MED ORDER — ONDANSETRON HCL 4 MG/2ML IJ SOLN
INTRAMUSCULAR | Status: DC | PRN
  Administered 2019-02-26: 4 mg via INTRAVENOUS

## 2019-02-26 MED ORDER — DEXAMETHASONE SODIUM PHOSPHATE 10 MG/ML IJ SOLN
INTRAMUSCULAR | Status: DC | PRN
  Administered 2019-02-26: 10 mg via INTRAVENOUS

## 2019-02-26 MED ORDER — PROPOFOL 500 MG/50ML IV EMUL
INTRAVENOUS | Status: DC | PRN
  Administered 2019-02-26: 50 ug/kg/min via INTRAVENOUS

## 2019-02-26 MED ORDER — SUCCINYLCHOLINE CHLORIDE 20 MG/ML IJ SOLN
INTRAMUSCULAR | Status: AC
  Filled 2019-02-26: qty 1

## 2019-02-26 MED ORDER — HEPARIN SODIUM (PORCINE) 1000 UNIT/ML IJ SOLN
INTRAMUSCULAR | Status: DC | PRN
  Administered 2019-02-26: 3000 [IU] via INTRAVENOUS

## 2019-02-26 MED ORDER — FENTANYL CITRATE (PF) 100 MCG/2ML IJ SOLN
INTRAMUSCULAR | Status: AC
  Administered 2019-02-26: 50 ug via INTRAVENOUS
  Filled 2019-02-26: qty 2

## 2019-02-26 MED ORDER — FENTANYL CITRATE (PF) 100 MCG/2ML IJ SOLN: 25.0000 ug | INTRAMUSCULAR | Status: DC | PRN

## 2019-02-26 MED ORDER — SODIUM CHLORIDE 0.9 % IV SOLN
INTRAVENOUS | Status: DC | PRN
  Administered 2019-02-26: 20 mL via INTRAMUSCULAR

## 2019-02-26 MED ORDER — ROCURONIUM BROMIDE 50 MG/5ML IV SOLN
INTRAVENOUS | Status: AC
  Filled 2019-02-26: qty 1

## 2019-02-26 MED ORDER — ONDANSETRON HCL 4 MG/2ML IJ SOLN
INTRAMUSCULAR | Status: AC
  Filled 2019-02-26: qty 10

## 2019-02-26 MED ORDER — BUPIVACAINE HCL (PF) 0.5 % IJ SOLN
INTRAMUSCULAR | Status: DC | PRN
  Administered 2019-02-26: 13 mL via PERINEURAL
  Administered 2019-02-26: 7 mL via PERINEURAL

## 2019-02-26 MED ORDER — OXYCODONE HCL 5 MG/5ML PO SOLN: 5.0000 mg | Freq: Once | ORAL | Status: DC | PRN

## 2019-02-26 MED ORDER — BUPIVACAINE HCL (PF) 0.5 % IJ SOLN
INTRAMUSCULAR | Status: AC
  Filled 2019-02-26: qty 10

## 2019-02-26 MED ORDER — GLYCOPYRROLATE 0.2 MG/ML IJ SOLN
INTRAMUSCULAR | Status: DC | PRN
  Administered 2019-02-26: 0.2 mg via INTRAVENOUS

## 2019-02-26 MED ORDER — METOPROLOL TARTRATE 5 MG/5ML IV SOLN
INTRAVENOUS | Status: DC | PRN
  Administered 2019-02-26: 1 mg via INTRAVENOUS
  Administered 2019-02-26: 2 mg via INTRAVENOUS
  Administered 2019-02-26 (×2): 1 mg via INTRAVENOUS

## 2019-02-26 MED ORDER — CHLORHEXIDINE GLUCONATE CLOTH 2 % EX PADS: 6.0000 | MEDICATED_PAD | Freq: Once | CUTANEOUS | Status: DC

## 2019-02-26 MED ORDER — BUPIVACAINE-EPINEPHRINE (PF) 0.5% -1:200000 IJ SOLN
INTRAMUSCULAR | Status: AC
  Filled 2019-02-26: qty 30

## 2019-02-26 MED ORDER — FAMOTIDINE 20 MG PO TABS
20.0000 mg | ORAL_TABLET | Freq: Once | ORAL | Status: AC
  Administered 2019-02-26: 09:00:00 20 mg via ORAL

## 2019-02-26 MED ORDER — LIDOCAINE HCL (PF) 2 % IJ SOLN
INTRAMUSCULAR | Status: AC
  Filled 2019-02-26: qty 10

## 2019-02-26 MED ORDER — DEXAMETHASONE SODIUM PHOSPHATE 10 MG/ML IJ SOLN
INTRAMUSCULAR | Status: AC
  Filled 2019-02-26: qty 3

## 2019-02-26 MED ORDER — HYDROCODONE-ACETAMINOPHEN 5-325 MG PO TABS: 1.0000 | ORAL_TABLET | Freq: Four times a day (QID) | ORAL | 0 refills | Status: DC | PRN

## 2019-02-26 MED ORDER — CEFAZOLIN SODIUM-DEXTROSE 1-4 GM/50ML-% IV SOLN
INTRAVENOUS | Status: AC
  Filled 2019-02-26: qty 50

## 2019-02-26 MED ORDER — OXYCODONE HCL 5 MG PO TABS: 5.0000 mg | ORAL_TABLET | Freq: Once | ORAL | Status: DC | PRN

## 2019-02-26 MED ORDER — PAPAVERINE HCL 30 MG/ML IJ SOLN
INTRAMUSCULAR | Status: AC
  Filled 2019-02-26: qty 2

## 2019-02-26 SURGICAL SUPPLY — 54 items
ADH SKN CLS APL DERMABOND .7 (GAUZE/BANDAGES/DRESSINGS) ×1
APL PRP STRL LF DISP 70% ISPRP (MISCELLANEOUS) ×1
BAG DECANTER FOR FLEXI CONT (MISCELLANEOUS) ×2 IMPLANT
BLADE SURG SZ11 CARB STEEL (BLADE) ×2 IMPLANT
BOOT SUTURE AID YELLOW STND (SUTURE) ×2 IMPLANT
BRUSH SCRUB EZ  4% CHG (MISCELLANEOUS) ×1
BRUSH SCRUB EZ 4% CHG (MISCELLANEOUS) ×1 IMPLANT
CANISTER SUCT 1200ML W/VALVE (MISCELLANEOUS) ×2 IMPLANT
CHLORAPREP W/TINT 26 (MISCELLANEOUS) ×2 IMPLANT
CLIP SPRNG 6 S-JAW DBL (CLIP) ×1 IMPLANT
CLIP SPRNG 6MM S-JAW DBL (CLIP) ×2
COVER WAND RF STERILE (DRAPES) ×2 IMPLANT
DERMABOND ADVANCED (GAUZE/BANDAGES/DRESSINGS) ×1
DERMABOND ADVANCED .7 DNX12 (GAUZE/BANDAGES/DRESSINGS) ×1 IMPLANT
ELECT CAUTERY BLADE 6.4 (BLADE) ×2 IMPLANT
ELECT REM PT RETURN 9FT ADLT (ELECTROSURGICAL) ×2
ELECTRODE REM PT RTRN 9FT ADLT (ELECTROSURGICAL) ×1 IMPLANT
GEL ULTRASOUND 20GR AQUASONIC (MISCELLANEOUS) IMPLANT
GLOVE BIO SURGEON STRL SZ7 (GLOVE) ×4 IMPLANT
GLOVE INDICATOR 7.5 STRL GRN (GLOVE) ×2 IMPLANT
GOWN STRL REUS W/ TWL LRG LVL3 (GOWN DISPOSABLE) ×2 IMPLANT
GOWN STRL REUS W/ TWL XL LVL3 (GOWN DISPOSABLE) ×1 IMPLANT
GOWN STRL REUS W/TWL LRG LVL3 (GOWN DISPOSABLE) ×4
GOWN STRL REUS W/TWL XL LVL3 (GOWN DISPOSABLE) ×2
HEMOSTAT SURGICEL 2X3 (HEMOSTASIS) ×2 IMPLANT
IV NS 500ML (IV SOLUTION) ×2
IV NS 500ML BAXH (IV SOLUTION) ×1 IMPLANT
KIT TURNOVER KIT A (KITS) ×2 IMPLANT
LABEL OR SOLS (LABEL) ×2 IMPLANT
LOOP RED MAXI  1X406MM (MISCELLANEOUS) ×1
LOOP VESSEL MAXI 1X406 RED (MISCELLANEOUS) ×1 IMPLANT
LOOP VESSEL MINI 0.8X406 BLUE (MISCELLANEOUS) ×1 IMPLANT
LOOPS BLUE MINI 0.8X406MM (MISCELLANEOUS) ×1
NDL FILTER BLUNT 18X1 1/2 (NEEDLE) ×1 IMPLANT
NEEDLE FILTER BLUNT 18X 1/2SAF (NEEDLE) ×1
NEEDLE FILTER BLUNT 18X1 1/2 (NEEDLE) ×1 IMPLANT
NS IRRIG 500ML POUR BTL (IV SOLUTION) ×2 IMPLANT
PACK EXTREMITY ARMC (MISCELLANEOUS) ×2 IMPLANT
SOLUTION CELL SAVER (CLIP) ×1 IMPLANT
STOCKINETTE 48X4 2 PLY STRL (GAUZE/BANDAGES/DRESSINGS) ×1 IMPLANT
STOCKINETTE STRL 4IN 9604848 (GAUZE/BANDAGES/DRESSINGS) ×2 IMPLANT
SUT MNCRL AB 4-0 PS2 18 (SUTURE) ×2 IMPLANT
SUT PROLENE 6 0 BV (SUTURE) ×8 IMPLANT
SUT SILK 2 0 (SUTURE) ×2
SUT SILK 2-0 18XBRD TIE 12 (SUTURE) ×1 IMPLANT
SUT SILK 3 0 (SUTURE) ×2
SUT SILK 3-0 18XBRD TIE 12 (SUTURE) ×1 IMPLANT
SUT SILK 4 0 (SUTURE) ×2
SUT SILK 4-0 18XBRD TIE 12 (SUTURE) ×1 IMPLANT
SUT VIC AB 3-0 SH 27 (SUTURE) ×2
SUT VIC AB 3-0 SH 27X BRD (SUTURE) ×1 IMPLANT
SYR 20ML LL LF (SYRINGE) ×2 IMPLANT
SYR 3ML LL SCALE MARK (SYRINGE) ×2 IMPLANT
SYR TB 1ML 27GX1/2 LL (SYRINGE) IMPLANT

## 2019-02-26 NOTE — Anesthesia Post-op Follow-up Note (Signed)
Anesthesia QCDR form completed.        

## 2019-02-26 NOTE — H&P (Signed)
[name] Admission History & Physical  MRN : [id number]  Jennifer Newman is a 53 y.o. (Apr 15, 1966) female who presents with chief complaint of No chief complaint on file. Marland Kitchen  History of Present Illness: Patient presents today for her dialysis access creation.  She has been on dialysis now for about 6 months.  She is using a PermCath and has had to be exchanged once.  She has no fevers or chills.  No specific complaints today.  She is getting her dialysis regularly through her PermCath and has not missed a dialysis recently.  Preoperative imaging several months ago suggested a left radiocephalic AV fistula creation would be acceptable based off of the arterial and vein findings.  That is the plan for today.  Current Facility-Administered Medications  Medication Dose Route Frequency Provider Last Rate Last Dose  . 0.9 %  sodium chloride infusion   Intravenous Continuous Piscitello, Precious Haws, MD 10 mL/hr at 02/26/19 [phone]    . bupivacaine (MARCAINE) 0.5 % injection           . bupivacaine (MARCAINE) 0.5 % injection           . ceFAZolin (ANCEF) 1-4 GM/50ML-% IVPB           . ceFAZolin (ANCEF) IVPB 1 g/50 mL premix  1 g Intravenous On Call to Ferry, Bibo, NP      . Chlorhexidine Gluconate Cloth 2 % PADS 6 each  6 each Topical Once Kris Hartmann, NP       And  . Chlorhexidine Gluconate Cloth 2 % PADS 6 each  6 each Topical Once Eulogio Ditch E, NP      . lidocaine (PF) (XYLOCAINE) 1 % injection           . lidocaine (XYLOCAINE) 2 % injection             Past Medical History:  Diagnosis Date  . Anxiety   . Bipolar disorder ([place])   . Chronic kidney disease   . Depression   . Dysrhythmia    tachycardia  . GERD (gastroesophageal reflux disease)   . Hypertension   . Obsessive-compulsive disorder   . PTSD (post-traumatic stress disorder)   . Tachycardia, unspecified   . Vertigo     Past Surgical History:  Procedure Laterality Date  . DIALYSIS/PERMA  CATHETER INSERTION N/A 08/22/2018   Procedure: DIALYSIS temporary catheter INSERTION;  Surgeon: Algernon Huxley, MD;  Location: [facility];  Service: Cardiovascular;  Laterality: N/A;  . DIALYSIS/PERMA CATHETER INSERTION N/A 08/26/2018   Procedure: DIALYSIS/PERMA CATHETER INSERTION;  Surgeon: Algernon Huxley, MD;  Location: [facility];  Service: Cardiovascular;  Laterality: N/A;  . DIALYSIS/PERMA CATHETER INSERTION N/A 01/01/2019   Procedure: DIALYSIS/PERMA CATHETER INSERTION;  Surgeon: Algernon Huxley, MD;  Location: [facility];  Service: Cardiovascular;  Laterality: N/A;  . none      Social History Social History   Tobacco Use  . Smoking status: Never Smoker  . Smokeless tobacco: Never Used  Substance Use Topics  . Alcohol use: Not Currently    Alcohol/week: 0.0 standard drinks  . Drug use: Not Currently    Types: Marijuana    Comment: Last used several weeks back     Family History Family History  Problem Relation Age of Onset  . COPD Neg Hx   . Diabetes Neg Hx   . Hypertension Neg Hx   . CAD Neg Hx  No bleeding or clotting disorders  No Known Allergies   REVIEW OF SYSTEMS (Negative unless checked)  Constitutional: [] Weight loss  [] Fever  [] Chills Cardiac: [] Chest pain   [] Chest pressure   [x] Palpitations   [] Shortness of breath when laying flat   [] Shortness of breath at rest   [x] Shortness of breath with exertion. Vascular:  [] Pain in legs with walking   [] Pain in legs at rest   [] Pain in legs when laying flat   [] Claudication   [] Pain in feet when walking  [] Pain in feet at rest  [] Pain in feet when laying flat   [] History of DVT   [] Phlebitis   [] Swelling in legs   [] Varicose veins   [] Non-healing ulcers Pulmonary:   [] Uses home oxygen   [] Productive cough   [] Hemoptysis   [] Wheeze  [] COPD   [] Asthma Neurologic:  [] Dizziness  [] Blackouts   [] Seizures   [] History of stroke   [] History of TIA  [] Aphasia   [] Temporary blindness   [] Dysphagia   [] Weakness  or numbness in arms   [] Weakness or numbness in legs Musculoskeletal:  [x] Arthritis   [] Joint swelling   [] Joint pain   [] Low back pain Hematologic:  [] Easy bruising  [] Easy bleeding   [] Hypercoagulable state   [x] Anemic  [] Hepatitis Gastrointestinal:  [] Blood in stool   [] Vomiting blood  [x] Gastroesophageal reflux/heartburn   [] Difficulty swallowing. Genitourinary:  [x] Chronic kidney disease   [] Difficult urination  [] Frequent urination  [] Burning with urination   [] Blood in urine Skin:  [] Rashes   [] Ulcers   [] Wounds Psychological:  [x] History of anxiety   [x]  History of major depression.  Physical Examination  Vitals:   02/26/19 0839 02/26/19 0952 02/26/19 0955  BP: 126/82 (!) 145/98 (!) 144/95  Pulse:  76 83  Resp: 16 15 16   Temp: 97.9 F (36.6 C)    TempSrc: Tympanic    SpO2: 97% 100% 100%  Weight: 96 kg    Height: 5\' 3"  (1.6 m)     Body mass index is 37.49 kg/m. Gen: WD/WN, NAD.  Obese Head: [place]/AT, No temporalis wasting.  Ear/Nose/Throat: Hearing grossly intact, nares w/o erythema or drainage, oropharynx w/o Erythema/Exudate,  Eyes: Conjunctiva clear, sclera non-icteric Neck: Trachea midline.  No JVD.  Pulmonary:  Good air movement, respirations not labored, no use of accessory muscles.  Cardiac: RRR, normal S1, S2. Vascular: PermCath in place in right subclavicular area Vessel Right Left  Radial Palpable Palpable   Musculoskeletal: M/S 5/5 throughout.  Extremities without ischemic changes.  No deformity or atrophy.  Neurologic: Sensation grossly intact in extremities.  Symmetrical.  Speech is fluent. Motor exam as listed above. Psychiatric: Judgment intact, Mood & affect appropriate for pt's clinical situation. Dermatologic: No rashes or ulcers noted.  No cellulitis or open wounds.      CBC Lab Results  Component Value Date   WBC 6.5 02/26/2019   HGB 12.9 02/26/2019   HCT 38.0 02/26/2019   MCV 86.1 02/26/2019   PLT 279 02/26/2019    BMET    Component  Value Date/Time   NA 137 02/26/2019 0859   NA 135 (L) 10/09/2013 1548   K 4.0 02/26/2019 0859   K 3.6 10/09/2013 1548   CL 100 02/26/2019 0849   CL 106 10/09/2013 1548   CO2 27 02/26/2019 0849   CO2 23 10/09/2013 1548   GLUCOSE 107 (H) 02/26/2019 0859   GLUCOSE 84 10/09/2013 1548   BUN 27 (H) 02/26/2019 0849   BUN 16 10/09/2013 1548   CREATININE 3.31 (  H) 02/26/2019 0849   CREATININE 0.60 10/09/2013 1548   CALCIUM 9.9 02/26/2019 0849   CALCIUM 8.9 10/09/2013 1548   GFRNONAA 15 (L) 02/26/2019 0849   GFRNONAA >60 10/09/2013 1548   GFRAA 18 (L) 02/26/2019 0849   GFRAA >60 10/09/2013 1548   Estimated Creatinine Clearance: 22.2 mL/min (A) (by C-G formula based on SCr of 3.31 mg/dL (H)).  COAG Lab Results  Component Value Date   INR 0.9 02/26/2019   INR 1.0 01/31/2019   INR 1.0 08/28/2018    Radiology [place] Or Nerve Block-image Only ([facility])  Result Date: 02/26/2019 There is no interpretation for this exam.  This order is for images obtained during a surgical procedure.  Please See "Surgeries" Tab for more information regarding the procedure.      Assessment/Plan 1.  End-stage renal disease.  For left radiocephalic AV fistula creation today.  This is based off preoperative imaging suggesting adequate vessels for placement.  Risks and benefits are discussed.  Patient is agreeable to proceed 2.  Hypertension.  Likely an underlying cause of her renal failure and blood pressure control important in reducing the progression of atherosclerotic disease. On appropriate oral medications. 3.  History of dysrhythmias.  Continue to watch on the monitor and keep a close eye on this in the perioperative period   Leotis Pain, MD  02/26/2019 9:58 AM

## 2019-02-26 NOTE — Anesthesia Preprocedure Evaluation (Signed)
Anesthesia Evaluation  Patient identified by MRN, date of birth, ID band Patient awake    Reviewed: Allergy & Precautions, H&P , NPO status , Patient's Chart, lab work & pertinent test results  History of Anesthesia Complications Negative for: history of anesthetic complications  Airway Mallampati: III  TM Distance: <3 FB Neck ROM: full    Dental  (+) Chipped   Pulmonary neg pulmonary ROS, neg shortness of breath,           Cardiovascular Exercise Tolerance: Good hypertension, (-) angina(-) Past MI and (-) DOE + dysrhythmias      Neuro/Psych  Headaches, PSYCHIATRIC DISORDERS    GI/Hepatic Neg liver ROS, GERD  Medicated and Controlled,  Endo/Other  negative endocrine ROS  Renal/GU DialysisRenal diseasenegative Renal ROS  negative genitourinary   Musculoskeletal   Abdominal   Peds  Hematology negative hematology ROS (+)   Anesthesia Other Findings Past Medical History: No date: Anxiety No date: Bipolar disorder ([place]) No date: Chronic kidney disease No date: Depression No date: Dysrhythmia     Comment:  tachycardia No date: GERD (gastroesophageal reflux disease) No date: Hypertension No date: Obsessive-compulsive disorder No date: PTSD (post-traumatic stress disorder) No date: Tachycardia, unspecified No date: Vertigo  Past Surgical History: 08/22/2018: DIALYSIS/PERMA CATHETER INSERTION; N/A     Comment:  Procedure: DIALYSIS temporary catheter INSERTION;                Surgeon: Algernon Huxley, MD;  Location: [facility];  Service: Cardiovascular;  Laterality: N/A; 08/26/2018: DIALYSIS/PERMA CATHETER INSERTION; N/A     Comment:  Procedure: DIALYSIS/PERMA CATHETER INSERTION;  Surgeon:               Algernon Huxley, MD;  Location: [facility];                Service: Cardiovascular;  Laterality: N/A; 01/01/2019: DIALYSIS/PERMA CATHETER INSERTION; N/A     Comment:  Procedure:  DIALYSIS/PERMA CATHETER INSERTION;  Surgeon:               Algernon Huxley, MD;  Location: [facility];                Service: Cardiovascular;  Laterality: N/A; No date: none  BMI    Body Mass Index: 37.49 kg/m      Reproductive/Obstetrics negative OB ROS                             Anesthesia Physical Anesthesia Plan  ASA: IV  Anesthesia Plan: General   Post-op Pain Management: GA combined w/ Regional for post-op pain   Induction: Intravenous  PONV Risk Score and Plan: Propofol infusion and TIVA  Airway Management Planned: Natural Airway and Nasal Cannula  Additional Equipment:   Intra-op Plan:   Post-operative Plan:   Informed Consent: I have reviewed the patients History and Physical, chart, labs and discussed the procedure including the risks, benefits and alternatives for the proposed anesthesia with the patient or authorized representative who has indicated his/her understanding and acceptance.     Dental Advisory Given  Plan Discussed with: Anesthesiologist, CRNA and Surgeon  Anesthesia Plan Comments: (Patient consented for risks of anesthesia including but not limited to:  - adverse reactions to medications - risk of intubation if required - damage to teeth, lips or other oral mucosa - sore  throat or hoarseness - Damage to heart, brain, lungs or loss of life  Patient voiced understanding.)        Anesthesia Quick Evaluation

## 2019-02-26 NOTE — Anesthesia Postprocedure Evaluation (Signed)
Anesthesia Post Note  Patient: Jennifer Newman  Procedure(s) Performed: ARTERIOVENOUS (AV) FISTULA CREATION ( RADIAL CEPHALIC ) (Left )  Patient location during evaluation: PACU Anesthesia Type: General Level of consciousness: awake and alert Pain management: pain level controlled Vital Signs Assessment: post-procedure vital signs reviewed and stable Respiratory status: spontaneous breathing, nonlabored ventilation, respiratory function stable and patient connected to nasal cannula oxygen Cardiovascular status: blood pressure returned to baseline and stable Postop Assessment: no apparent nausea or vomiting Anesthetic complications: no     Last Vitals:  Vitals:   02/26/19 1228 02/26/19 1310  BP: 124/76 112/72  Pulse: 78 78  Resp: 18 18  Temp: 36.8 C   SpO2: 94% 94%    Last Pain:  Vitals:   02/26/19 1310  TempSrc:   PainSc: 0-No pain                 Precious Haws Anaya Bovee

## 2019-02-26 NOTE — Op Note (Signed)
[facility]   OPERATIVE NOTE   PROCEDURE: left radiocephaic arteriovenous fistula placement  PRE-OPERATIVE DIAGNOSIS: 1. ESRD  POST-OPERATIVE DIAGNOSIS: Same  SURGEON: Leotis Pain, MD  ASSISTANT(S): none  ANESTHESIA: general  ESTIMATED BLOOD LOSS: 10 cc  FINDING(S): none  SPECIMEN(S):  None  INDICATIONS:   Jennifer Newman is a 53 y.o. female who presents with ESRD.  The patient is scheduled for left radiocephalic arteriovenous fistula placement when non-invasive studies suggested adequate anatomy for fistula creation at this location.  The patient is aware the risks include but are not limited to: bleeding, infection, steal syndrome, nerve damage, ischemic monomelic neuropathy, failure to mature, and need for additional procedures.  The patient is aware of the risks of the procedure and elects to proceed forward.  DESCRIPTION: After full informed written consent was obtained from the patient, the patient was brought back to the operating room and placed supine upon the operating table.  Prior to induction, the patient received IV antibiotics.   After obtaining adequate anesthesia, the patient was then prepped and draped in the standard fashion for a left arm access procedure.  I turned my attention first to identifying the patient's distal cephalic vein and radial artery.  I made an incision at the level of the distal forearm and wrist and dissected through the subcutaneous tissue and fascia to gain exposure of the radial artery.  This was noted to be of decent sized and not disease and useable for fistula creation.  This was dissected out proximally and distally and controlled with vessel loops.  I then dissected out the cephalic vein.  This was noted to be patent and adequate size for fistula creation. I then gave the patient 3000 units of intravenous heparin.  The distal segment of the vein was ligated with a  2-0 silk, and the vein was transected.  The proximal  segment was interrogated with serial dilators.  The vein accepted up to a 3.5 mm dilator without any difficulty.  I then instilled the heparinized saline into the vein and clamped it.  At this point, I reset my exposure of the radial artery and placed the artery under tension proximally and distally.  I made an arteriotomy with a #11 blade, and then I extended the arteriotomy with a Potts scissor.  I injected heparinized saline proximal and distal to this arteriotomy.  The vein was then sewn to the artery in an end-to-side configuration with a running stitch of 6-0 Prolene.  Prior to completing this anastomosis, I allowed the vein and artery to backbleed.  There was no evidence of clot from any vessels.  I completed the anastomosis in the usual fashion and then released all vessel loops and clamps.  There was a palpable  thrill in the venous outflow, and there was a palpable radial pulse beyond the anastomosis.  At this point, I irrigated out the surgical wound. Surgicel was placed. There was no further active bleeding.  The subcutaneous tissue was reapproximated with a running stitch of 3-0 Vicryl.  The skin was then reapproximated with a running subcuticular stitch of 4-0 Monocryl.  The skin was then cleaned, dried, and reinforced with Dermabond.  The patient tolerated this procedure well and was taken to the recovery room in stable condition  COMPLICATIONS: None  CONDITION: Stable   Leotis Pain, MD 02/26/2019 11:34 AM   This note was created with Dragon Medical transcription system. Any errors in dictation are purely unintentional.

## 2019-02-26 NOTE — Transfer of Care (Signed)
Immediate Anesthesia Transfer of Care Note  Patient: Jennifer Newman  Procedure(s) Performed: ARTERIOVENOUS (AV) FISTULA CREATION ( RADIAL CEPHALIC ) (Left )  Patient Location: PACU  Anesthesia Type:General  Level of Consciousness: awake, alert , oriented and patient cooperative  Airway & Oxygen Therapy: Patient Spontanous Breathing and Patient connected to face mask oxygen  Post-op Assessment: Report given to RN and Post -op Vital signs reviewed and stable  Post vital signs: Reviewed and stable  Last Vitals:  Vitals Value Taken Time  BP 130/82 02/26/19 1130  Temp 36.4 C 02/26/19 1130  Pulse 80 02/26/19 1144  Resp 21 02/26/19 1144  SpO2 94 % 02/26/19 1144  Vitals shown include unvalidated device data.  Last Pain:  Vitals:   02/26/19 1140  TempSrc:   PainSc: 0-No pain         Complications: No apparent anesthesia complications

## 2019-02-26 NOTE — Discharge Instructions (Signed)

## 2019-02-26 NOTE — Anesthesia Procedure Notes (Signed)
Anesthesia Regional Block: Supraclavicular block   Pre-Anesthetic Checklist: ,, timeout performed, Correct Patient, Correct Site, Correct Laterality, Correct Procedure, Correct Position, site marked, Risks and benefits discussed,  Surgical consent,  Pre-op evaluation,  At surgeon's request and post-op pain management  Laterality: Upper and Left  Prep: chloraprep       Needles:  Injection technique: Single-shot  Needle Type: Stimiplex     Needle Length: 5cm  Needle Gauge: 22     Additional Needles:   Procedures:,,,, ultrasound used (permanent image in chart),,,,  Narrative:  Start time: 02/26/2019 9:50 AM End time: 02/26/2019 9:55 AM Injection made incrementally with aspirations every 5 mL.  Performed by: Personally  Anesthesiologist: Piscitello, Precious Haws, MD  Additional Notes: Patient consented for risk and benefits of nerve block including but not limited to nerve damage, failed block, bleeding and infection.  Patient voiced understanding.  Functioning IV was confirmed and monitors were applied.  A 59mm 22ga Stimuplex needle was used. Sterile prep,hand hygiene and sterile gloves were used.  Minimal sedation used for procedure.  No paresthesia endorsed by patient during the procedure.  Negative aspiration and negative test dose prior to incremental administration of local anesthetic. The patient tolerated the procedure well with no immediate complications.

## 2019-02-27 ENCOUNTER — Encounter: Payer: Self-pay | Admitting: Vascular Surgery

## 2019-03-06 ENCOUNTER — Other Ambulatory Visit (INDEPENDENT_AMBULATORY_CARE_PROVIDER_SITE_OTHER): Payer: Self-pay | Admitting: Nurse Practitioner

## 2019-03-06 ENCOUNTER — Other Ambulatory Visit (INDEPENDENT_AMBULATORY_CARE_PROVIDER_SITE_OTHER): Payer: Self-pay | Admitting: Vascular Surgery

## 2019-03-06 DIAGNOSIS — N186 End stage renal disease: Secondary | ICD-10-CM

## 2019-03-06 DIAGNOSIS — T829XXA Unspecified complication of cardiac and vascular prosthetic device, implant and graft, initial encounter: Secondary | ICD-10-CM

## 2019-03-07 ENCOUNTER — Ambulatory Visit (INDEPENDENT_AMBULATORY_CARE_PROVIDER_SITE_OTHER): Payer: BC Managed Care – PPO | Admitting: Nurse Practitioner

## 2019-03-07 ENCOUNTER — Encounter (INDEPENDENT_AMBULATORY_CARE_PROVIDER_SITE_OTHER): Payer: Self-pay | Admitting: Nurse Practitioner

## 2019-03-07 ENCOUNTER — Other Ambulatory Visit: Payer: Self-pay

## 2019-03-07 ENCOUNTER — Other Ambulatory Visit
Admission: RE | Admit: 2019-03-07 | Discharge: 2019-03-07 | Disposition: A | Discharge status: Home / Self Care | Payer: BC Managed Care – PPO | Source: Ambulatory Visit | Attending: Vascular Surgery | Admitting: Vascular Surgery

## 2019-03-07 ENCOUNTER — Ambulatory Visit (INDEPENDENT_AMBULATORY_CARE_PROVIDER_SITE_OTHER): Payer: BC Managed Care – PPO

## 2019-03-07 ENCOUNTER — Encounter (INDEPENDENT_AMBULATORY_CARE_PROVIDER_SITE_OTHER): Payer: Self-pay

## 2019-03-07 ENCOUNTER — Telehealth (INDEPENDENT_AMBULATORY_CARE_PROVIDER_SITE_OTHER): Payer: Self-pay

## 2019-03-07 VITALS — BP 131/99 | HR 88 | Resp 14 | Ht 62.0 in | Wt 209.0 lb

## 2019-03-07 DIAGNOSIS — Z01812 Encounter for preprocedural laboratory examination: Secondary | ICD-10-CM | POA: Diagnosis not present

## 2019-03-07 DIAGNOSIS — T829XXA Unspecified complication of cardiac and vascular prosthetic device, implant and graft, initial encounter: Secondary | ICD-10-CM | POA: Diagnosis not present

## 2019-03-07 DIAGNOSIS — E785 Hyperlipidemia, unspecified: Secondary | ICD-10-CM

## 2019-03-07 DIAGNOSIS — I10 Essential (primary) hypertension: Secondary | ICD-10-CM

## 2019-03-07 DIAGNOSIS — N186 End stage renal disease: Secondary | ICD-10-CM | POA: Diagnosis not present

## 2019-03-07 DIAGNOSIS — Z992 Dependence on renal dialysis: Secondary | ICD-10-CM

## 2019-03-07 DIAGNOSIS — Z20828 Contact with and (suspected) exposure to other viral communicable diseases: Secondary | ICD-10-CM | POA: Diagnosis present

## 2019-03-07 NOTE — Progress Notes (Signed)
SUBJECTIVE:  Patient ID: Jennifer Newman, female    DOB: [birth year], 53 y.o.   MRN: [id number] Chief Complaint  Patient presents with  . Follow-up    HPI  Jennifer Newman is a 53 y.o. female presents today after fistula creation on 826.  The patient had a left radiocephalic fistula placed.  Currently the patient is still maintained via PermCath.  The patient's dialysis center contacted our office to let us know that they were having issues with feeling a thrill and a bruit.  Otherwise the patient denies any issues with her fistula.  She denies any fever, chills, nausea, vomiting or diarrhea.  She does have some swelling around the area which is to be expected but no drainage or erythema.  Past Medical History:  Diagnosis Date  . Anxiety   . Bipolar disorder ([place])   . Chronic kidney disease   . Depression   . Dysrhythmia    tachycardia  . GERD (gastroesophageal reflux disease)   . Hypertension   . Obsessive-compulsive disorder   . PTSD (post-traumatic stress disorder)   . Tachycardia, unspecified   . Vertigo     Past Surgical History:  Procedure Laterality Date  . AV FISTULA PLACEMENT Left 02/26/2019   Procedure: ARTERIOVENOUS (AV) FISTULA CREATION ( RADIAL CEPHALIC );  Surgeon: Algernon Huxley, MD;  Location: [facility];  Service: Vascular;  Laterality: Left;  . DIALYSIS/PERMA CATHETER INSERTION N/A 08/22/2018   Procedure: DIALYSIS temporary catheter INSERTION;  Surgeon: Algernon Huxley, MD;  Location: [facility];  Service: Cardiovascular;  Laterality: N/A;  . DIALYSIS/PERMA CATHETER INSERTION N/A 08/26/2018   Procedure: DIALYSIS/PERMA CATHETER INSERTION;  Surgeon: Algernon Huxley, MD;  Location: [facility];  Service: Cardiovascular;  Laterality: N/A;  . DIALYSIS/PERMA CATHETER INSERTION N/A 01/01/2019   Procedure: DIALYSIS/PERMA CATHETER INSERTION;  Surgeon: Algernon Huxley, MD;  Location: [facility];  Service: Cardiovascular;  Laterality: N/A;  . none       Social History   Socioeconomic History  . Marital status: Married    Spouse name: Corene Cornea  . Number of children: 2  . Years of education: Not on file  . Highest education level: Associate degree: occupational, Hotel manager, or vocational program  Occupational History  . 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 Financial resource strain: Not hard at all  . Food insecurity    Worry: Never true    Inability: Never true  . Transportation needs    Medical: No    Non-medical: No  Tobacco Use  . Smoking status: Never Smoker  . Smokeless tobacco: Never Used  Substance and Sexual Activity  . Alcohol use: Not Currently    Alcohol/week: 0.0 standard drinks  . Drug use: Not Currently    Types: Marijuana    Comment: Last used several weeks back   . Sexual activity: Not Currently  Lifestyle  . Physical activity    Days per week: 0 days    Minutes per session: 0 min  . Stress: Rather much  Relationships  . Social Herbalist on phone: Never    Gets together: Never    Attends religious service: Never    Active member of club or organization: No    Attends meetings of clubs or organizations: Never    Relationship status: Married  . Intimate partner violence    Fear of current or ex partner: No  Emotionally abused: No    Physically abused: No    Forced sexual activity: No  Other Topics Concern  . Not on file  Social History Narrative  . Not on file    Family History  Problem Relation Age of Onset  . COPD Neg Hx   . Diabetes Neg Hx   . Hypertension Neg Hx   . CAD Neg Hx     No Known Allergies   Review of Systems   Review of Systems: Negative Unless Checked Constitutional: [] Weight loss  [] Fever  [] Chills Cardiac: [] Chest pain   []  Atrial Fibrillation  [] Palpitations   [] Shortness of breath when laying flat   [] Shortness of breath with exertion. [] Shortness of breath at rest Vascular:  [] Pain in legs  with walking   [] Pain in legs with standing [] Pain in legs when laying flat   [] Claudication    [] Pain in feet when laying flat    [] History of DVT   [] Phlebitis   [] Swelling in legs   [] Varicose veins   [] Non-healing ulcers Pulmonary:   [] Uses home oxygen   [] Productive cough   [] Hemoptysis   [] Wheeze  [] COPD   [] Asthma Neurologic:  [] Dizziness   [] Seizures  [] Blackouts [] History of stroke   [] History of TIA  [] Aphasia   [] Temporary Blindness   [] Weakness or numbness in arm   [] Weakness or numbness in leg Musculoskeletal:   [] Joint swelling   [] Joint pain   [] Low back pain  []  History of Knee Replacement [] Arthritis [] back Surgeries  []  Spinal Stenosis    Hematologic:  [] Easy bruising  [] Easy bleeding   [] Hypercoagulable state   [x] Anemic Gastrointestinal:  [] Diarrhea   [] Vomiting  [] Gastroesophageal reflux/heartburn   [] Difficulty swallowing. [] Abdominal pain Genitourinary:  [x] Chronic kidney disease   [] Difficult urination  [] Anuric   [] Blood in urine [] Frequent urination  [] Burning with urination   [] Hematuria Skin:  [] Rashes   [] Ulcers [] Wounds Psychological:  [x] History of anxiety   [x]  History of major depression  []  Memory Difficulties      OBJECTIVE:   Physical Exam  BP (!) 131/99 (BP Location: Right Wrist, Patient Position: Sitting, Cuff Size: Normal)   Pulse 88   Resp 14   Ht 5\' 2"  (1.575 m)   Wt 209 lb (94.8 kg)   LMP 08/31/2018   BMI 38.23 kg/m   Gen: WD/WN, NAD Head: [place]/AT, No temporalis wasting.  Ear/Nose/Throat: Hearing grossly intact, nares w/o erythema or drainage Eyes: PER, EOMI, sclera nonicteric.  Neck: Supple, no masses.  No JVD.  Pulmonary:  Good air movement, no use of accessory muscles.  Cardiac: RRR Vascular:  No thrill or bruit Vessel Right Left  Radial Palpable Palpable   Gastrointestinal: soft, non-distended. No guarding/no peritoneal signs.  Musculoskeletal: M/S 5/5 throughout.  No deformity or atrophy.  Neurologic: Pain and light touch intact in  extremities.  Symmetrical.  Speech is fluent. Motor exam as listed above. Psychiatric: Judgment intact, Mood & affect appropriate for pt's clinical situation. Dermatologic: No Venous rashes. No Ulcers Noted.  No changes consistent with cellulitis. Lymph : No Cervical lymphadenopathy, no lichenification or skin changes of chronic lymphedema.       ASSESSMENT AND PLAN:  1. ESRD on dialysis [facility]) Recommend:  The patient is experiencing increasing problems with their dialysis access.  Patient should have a thrombectomy with the intention for intervention.  The intention for intervention is to restore appropriate flow and prevent further thrombosis and possible loss of the access.  As well as improve the  quality of dialysis therapy.  The risks, benefits and alternative therapies were reviewed in detail with the patient.  All questions were answered.  The patient agrees to proceed with angio/intervention.      2. Essential hypertension Continue antihypertensive medications as already ordered, these medications have been reviewed and there are no changes at this time.   3. Hyperlipidemia, unspecified hyperlipidemia type Continue statin as ordered and reviewed, no changes at this time    Current Outpatient Medications on File Prior to Visit  Medication Sig Dispense Refill  . amLODipine (NORVASC) 10 MG tablet Take 1 tablet by mouth 1 day or 1 dose.    . carvedilol (COREG) 25 MG tablet Take 1 tablet by mouth once.     . clonazePAM (KLONOPIN) 0.5 MG tablet Take 1 tablet by mouth daily as needed.     . ferric citrate (AURYXIA) 1 GM 210 MG(Fe) tablet Take 420 mg by mouth 3 (three) times daily with meals.    . fluticasone (FLONASE) 50 MCG/ACT nasal spray Place 1 spray into both nostrils 2 (two) times daily.    Marland Kitchen HYDROcodone-acetaminophen (NORCO) 5-325 MG tablet Take 1 tablet by mouth every 6 (six) hours as needed for moderate pain. 30 tablet 0  . losartan (COZAAR) 100 MG tablet Take 25 mg by  mouth every evening.     . nortriptyline (PAMELOR) 10 MG capsule Take 2 capsules by mouth at bedtime.     . sertraline (ZOLOFT) 25 MG tablet Take 1 tablet by mouth 1 day or 1 dose.    . Vitamin D, Ergocalciferol, (DRISDOL) 1.25 MG (50000 UT) CAPS capsule Take 1 capsule by mouth once a week.     No current facility-administered medications on file prior to visit.     There are no Patient Instructions on file for this visit. No follow-ups on file.   Kris Hartmann, NP  This note was completed with Sales executive.  Any errors are purely unintentional.

## 2019-03-07 NOTE — Telephone Encounter (Signed)
Patient was seen today in the office and is now scheduled for a thrombectomy with Dr. Lucky Cowboy for 03/12/2019 with a 2:30 pm arrival time to the [facility]. The patient will do the Covid testing today at the [facility]. Pre-procedure instructions were discussed and the paperwork was given to the patient.

## 2019-03-08 LAB — SARS CORONAVIRUS 2 (TAT 6-24 HRS): SARS Coronavirus 2: NEGATIVE

## 2019-03-11 ENCOUNTER — Other Ambulatory Visit (INDEPENDENT_AMBULATORY_CARE_PROVIDER_SITE_OTHER): Payer: Self-pay | Admitting: Nurse Practitioner

## 2019-03-12 ENCOUNTER — Encounter
Admission: RE | Disposition: A | Discharge status: Home / Self Care | Payer: Self-pay | Source: Home / Self Care | Attending: Vascular Surgery

## 2019-03-12 ENCOUNTER — Other Ambulatory Visit: Payer: Self-pay

## 2019-03-12 ENCOUNTER — Ambulatory Visit
Admission: RE | Admit: 2019-03-12 | Discharge: 2019-03-12 | Disposition: A | Discharge status: Home / Self Care | Payer: BC Managed Care – PPO | Attending: Vascular Surgery | Admitting: Vascular Surgery

## 2019-03-12 DIAGNOSIS — I120 Hypertensive chronic kidney disease with stage 5 chronic kidney disease or end stage renal disease: Secondary | ICD-10-CM | POA: Diagnosis not present

## 2019-03-12 DIAGNOSIS — I82409 Acute embolism and thrombosis of unspecified deep veins of unspecified lower extremity: Secondary | ICD-10-CM

## 2019-03-12 DIAGNOSIS — Y841 Kidney dialysis as the cause of abnormal reaction of the patient, or of later complication, without mention of misadventure at the time of the procedure: Secondary | ICD-10-CM | POA: Diagnosis not present

## 2019-03-12 DIAGNOSIS — Z79899 Other long term (current) drug therapy: Secondary | ICD-10-CM | POA: Diagnosis not present

## 2019-03-12 DIAGNOSIS — T82868A Thrombosis of vascular prosthetic devices, implants and grafts, initial encounter: Secondary | ICD-10-CM | POA: Insufficient documentation

## 2019-03-12 DIAGNOSIS — N186 End stage renal disease: Secondary | ICD-10-CM | POA: Insufficient documentation

## 2019-03-12 DIAGNOSIS — E785 Hyperlipidemia, unspecified: Secondary | ICD-10-CM | POA: Insufficient documentation

## 2019-03-12 DIAGNOSIS — F419 Anxiety disorder, unspecified: Secondary | ICD-10-CM | POA: Insufficient documentation

## 2019-03-12 DIAGNOSIS — Z992 Dependence on renal dialysis: Secondary | ICD-10-CM | POA: Diagnosis not present

## 2019-03-12 DIAGNOSIS — K219 Gastro-esophageal reflux disease without esophagitis: Secondary | ICD-10-CM | POA: Insufficient documentation

## 2019-03-12 DIAGNOSIS — F319 Bipolar disorder, unspecified: Secondary | ICD-10-CM | POA: Insufficient documentation

## 2019-03-12 HISTORY — PX: PERIPHERAL VASCULAR THROMBECTOMY: CATH118306

## 2019-03-12 LAB — POTASSIUM (ARMC VASCULAR LAB ONLY): Potassium (ARMC vascular lab): 4.3 (ref 3.5–5.1)

## 2019-03-12 SURGERY — PERIPHERAL VASCULAR THROMBECTOMY
Anesthesia: Moderate Sedation | Site: Arm Upper | Laterality: Left

## 2019-03-12 MED ORDER — ONDANSETRON HCL 4 MG/2ML IJ SOLN: 4.0000 mg | Freq: Four times a day (QID) | INTRAMUSCULAR | Status: DC | PRN

## 2019-03-12 MED ORDER — MIDAZOLAM HCL 5 MG/5ML IJ SOLN
INTRAMUSCULAR | Status: AC
  Filled 2019-03-12: qty 10

## 2019-03-12 MED ORDER — HEPARIN SODIUM (PORCINE) 1000 UNIT/ML IJ SOLN
INTRAMUSCULAR | Status: AC
  Filled 2019-03-12: qty 1

## 2019-03-12 MED ORDER — SODIUM CHLORIDE 0.9 % IV SOLN: INTRAVENOUS | Status: DC

## 2019-03-12 MED ORDER — FAMOTIDINE 20 MG PO TABS: 40.0000 mg | ORAL_TABLET | Freq: Once | ORAL | Status: DC | PRN

## 2019-03-12 MED ORDER — IODIXANOL 320 MG/ML IV SOLN
INTRAVENOUS | Status: DC | PRN
  Administered 2019-03-12: 35 mL

## 2019-03-12 MED ORDER — METOPROLOL TARTRATE 5 MG/5ML IV SOLN
INTRAVENOUS | Status: AC
  Filled 2019-03-12: qty 5

## 2019-03-12 MED ORDER — FENTANYL CITRATE (PF) 100 MCG/2ML IJ SOLN
INTRAMUSCULAR | Status: AC
  Filled 2019-03-12: qty 4

## 2019-03-12 MED ORDER — METOPROLOL TARTRATE 5 MG/5ML IV SOLN
INTRAVENOUS | Status: DC | PRN
  Administered 2019-03-12: 5 mg via INTRAVENOUS

## 2019-03-12 MED ORDER — MIDAZOLAM HCL 2 MG/ML PO SYRP: 8.0000 mg | ORAL_SOLUTION | Freq: Once | ORAL | Status: DC | PRN

## 2019-03-12 MED ORDER — CEFAZOLIN SODIUM-DEXTROSE 1-4 GM/50ML-% IV SOLN
1.0000 g | Freq: Once | INTRAVENOUS | Status: AC
  Administered 2019-03-12: 1 g via INTRAVENOUS

## 2019-03-12 MED ORDER — FENTANYL CITRATE (PF) 100 MCG/2ML IJ SOLN
INTRAMUSCULAR | Status: DC | PRN
  Administered 2019-03-12 (×3): 25 ug via INTRAVENOUS
  Administered 2019-03-12: 50 ug via INTRAVENOUS
  Administered 2019-03-12 (×3): 25 ug via INTRAVENOUS

## 2019-03-12 MED ORDER — DIPHENHYDRAMINE HCL 50 MG/ML IJ SOLN: 50.0000 mg | Freq: Once | INTRAMUSCULAR | Status: DC | PRN

## 2019-03-12 MED ORDER — METHYLPREDNISOLONE SODIUM SUCC 125 MG IJ SOLR: 125.0000 mg | Freq: Once | INTRAMUSCULAR | Status: DC | PRN

## 2019-03-12 MED ORDER — HEPARIN SODIUM (PORCINE) 1000 UNIT/ML IJ SOLN
INTRAMUSCULAR | Status: DC | PRN
  Administered 2019-03-12: 3000 [IU] via INTRAVENOUS

## 2019-03-12 MED ORDER — HYDROMORPHONE HCL 1 MG/ML IJ SOLN: 1.0000 mg | Freq: Once | INTRAMUSCULAR | Status: DC | PRN

## 2019-03-12 MED ORDER — MIDAZOLAM HCL 2 MG/2ML IJ SOLN
INTRAMUSCULAR | Status: DC | PRN
  Administered 2019-03-12: 1 mg via INTRAVENOUS
  Administered 2019-03-12: 2 mg via INTRAVENOUS
  Administered 2019-03-12: 1 mg via INTRAVENOUS
  Administered 2019-03-12: 2 mg via INTRAVENOUS

## 2019-03-12 SURGICAL SUPPLY — 20 items
BALLN ULTRVRSE 3X150X150 (BALLOONS) ×2
BALLN ULTRVRSE 4X40X150 (BALLOONS) ×2
BALLN ULTRVRSE 4X40X150 OTW (BALLOONS) ×1
BALLN ULTRVRSE 5X150X150 (BALLOONS) ×2
BALLOON ULTRVRSE 3X150X150 (BALLOONS) IMPLANT
BALLOON ULTRVRSE 4X40X150 OTW (BALLOONS) IMPLANT
BALLOON ULTRVRSE 5X150X150 (BALLOONS) IMPLANT
CANNULA 5F STIFF (CANNULA) ×1 IMPLANT
CATH BEACON 5 .035 40 KMP TP (CATHETERS) IMPLANT
CATH BEACON 5 .038 40 KMP TP (CATHETERS) ×1
DEVICE PRESTO INFLATION (MISCELLANEOUS) ×1 IMPLANT
DRAPE BRACHIAL (DRAPES) ×1 IMPLANT
GLIDEWIRE STIFF .35X180X3 HYDR (WIRE) ×1 IMPLANT
PACK ANGIOGRAPHY (CUSTOM PROCEDURE TRAY) ×2 IMPLANT
SET AVX THROMB ULT (MISCELLANEOUS) ×1 IMPLANT
SHEATH BRITE TIP 6FRX5.5 (SHEATH) ×2 IMPLANT
STENT VIABAHN 6X150X120 (Permanent Stent) ×1 IMPLANT
STENT VIABAHN 6X25X120 (Permanent Stent) ×1 IMPLANT
SUT MNCRL AB 4-0 PS2 18 (SUTURE) ×1 IMPLANT
WIRE G 018X200 V18 (WIRE) ×1 IMPLANT

## 2019-03-12 NOTE — H&P (Signed)
[facility] History & Physical Update  The patient was interviewed and re-examined.  The patient's previous History and Physical has been reviewed and is unchanged.  There is no change in the plan of care. We plan to proceed with the scheduled procedure.  Leotis Pain, MD  03/12/2019, 2:42 PM

## 2019-03-12 NOTE — Op Note (Signed)
[facility]    OPERATIVE NOTE   PROCEDURE: 1.  Left radiocephalic arteriovenous fistula cannulation under ultrasound guidance in an antegrade fashion crossing just beyond the anastomosis 2.  Left arm fistulagram  3.  Mechanical rheolytic thrombectomy to the left radiocephalic AV fistula with the AngioJet AVX catheter 4.  Percutaneous transluminal angioplasty of the cephalic vein with 3 mm diameter angioplasty balloon.   5.  Viabahn stent placement x2 to the left cephalic vein with 6 mm diameter by 15 cm length stent and 6 mm diameter by 2.5 cm length stent  PRE-OPERATIVE DIAGNOSIS: 1. ESRD 2.  Occluded left radiocephalic AV fistula  POST-OPERATIVE DIAGNOSIS: same as above   SURGEON: Leotis Pain, MD  ANESTHESIA: local with Moderate Conscious Sedation for 30 minutes using 6 mg of Versed and 200 Mcg of Fentanyl  ESTIMATED BLOOD LOSS: 10 cc  FINDING(S): 1. Atretic forearm cephalic vein  SPECIMEN(S):  None  CONTRAST: 35 cc  FLUORO TIME: 7.5 minutes  INDICATIONS: Patient is a 53 y.o.female who presents with a thrombosed left radiocephalic arteriovenous fistula.  It had not yet been used for dialysis and was only a few weeks old.  The patient is scheduled for an attempted declot and shuntogram.  The patient is aware the risks include but are not limited to: bleeding, infection, thrombosis of the cannulated access, and possible anaphylactic reaction to the contrast.  The patient is aware of the risks of the procedure and elects to proceed forward.  DESCRIPTION: After full informed written consent was obtained, the patient was brought back to the angiography suite and placed supine upon the angiography table.  The patient was connected to monitoring equipment. Moderate conscious sedation was administered during a face to face encounter with the patient throughout the procedure with my supervision of the RN administering medicines and monitoring the patient's vital  signs, pulse oximetry, telemetry and mental status throughout from the start of the procedure until the patient was taken to the recovery room. The left arm was prepped and draped in the standard fashion for a percutaneous access intervention.  Under ultrasound guidance, the left radiocephalic arteriovenous fistula was cannulated with a micropuncture needle under direct ultrasound guidance due to the pulseless nature of the fistula in an antegrade fashion just beyond the radiocephalic anastomosis, and permanent images were performed.  The microwire was advanced and the needle was exchanged for the a microsheath.  I then upsized to a 6 Fr Sheath and imaging was performed.  This demonstrated the first 2 to 3 cm of the AV fistula cephalic vein to be patent, but then is occluded and the vein was quite atretic all the way up near the antecubital fossa.  There is a small amount of thrombus at the initial occlusion 3 to 4 cm beyond the anastomosis.  Based on the images, this patient will need extensive treatment to salvage the graft. I then gave the patient 3000 units of intravenous heparin.  I then exchanged for a 0.018 wire.  Initially, to get the AngioJet catheter to go in we had to predilate the cephalic vein with a 3 mm balloon.  A 3 mm diameter by 15 cm length balloon was inflated to 12 atm for 1 minute.  We then ran the AngioJet thrombectomy device and the cephalic vein to try to remove the thrombus.  There remained an atretic cephalic vein with a near occlusive stenosis and thrombus about 4 to 5 cm from the anastomosis.  I felt her best  chance at salvaging this would be placing a via bond stent and then post dilating it hoping for balloon assisted maturation in the future.  A 6 mm diameter by 15 cm length Viabahn stent was then deployed just distal to the antecubital fossa before the venous branching into the cephalic, basilic, and deep vein system.  A second short 6 mm diameter by 2.5 cm length stent was then  deployed as close to the access site as possible.  These were postdilated with 5 mm balloon for the majority of the forearm cephalic vein and then 4 mm balloon near the anastomosis.  Imaging following this was difficult as the sheath was barely in the vein but I felt we had done all we could do to attempt to salvage this fistula.  The sheath was removed.  A 4-0 Monocryl purse-string suture was sewn around the sheath.  A sterile bandage was applied to the puncture site.  COMPLICATIONS: None  CONDITION: Stable   Leotis Pain 03/12/2019 6:06 PM   This note was created with Dragon Medical transcription system. Any errors in dictation are purely unintentional.

## 2019-03-12 NOTE — Progress Notes (Signed)
Patient in sinus rhythm HR 70-100, but has has several instances of tachycardia with HR in 140s while in preprocedure. Patient asymptomatic at times of tachycardia, slows to normal sinus spontaneously. MD notified. No new orders at this time, pending potassium results.

## 2019-03-13 ENCOUNTER — Encounter: Payer: Self-pay | Admitting: Vascular Surgery

## 2019-03-21 ENCOUNTER — Ambulatory Visit (INDEPENDENT_AMBULATORY_CARE_PROVIDER_SITE_OTHER): Payer: BC Managed Care – PPO

## 2019-03-21 ENCOUNTER — Encounter (INDEPENDENT_AMBULATORY_CARE_PROVIDER_SITE_OTHER): Payer: Self-pay | Admitting: Vascular Surgery

## 2019-03-21 ENCOUNTER — Other Ambulatory Visit (INDEPENDENT_AMBULATORY_CARE_PROVIDER_SITE_OTHER): Payer: BC Managed Care – PPO

## 2019-03-21 ENCOUNTER — Other Ambulatory Visit: Payer: Self-pay

## 2019-03-21 ENCOUNTER — Ambulatory Visit (INDEPENDENT_AMBULATORY_CARE_PROVIDER_SITE_OTHER): Payer: BC Managed Care – PPO | Admitting: Vascular Surgery

## 2019-03-21 ENCOUNTER — Other Ambulatory Visit (INDEPENDENT_AMBULATORY_CARE_PROVIDER_SITE_OTHER): Payer: Self-pay | Admitting: Vascular Surgery

## 2019-03-21 VITALS — BP 133/88 | HR 90 | Resp 16 | Ht 63.0 in | Wt 209.8 lb

## 2019-03-21 DIAGNOSIS — N186 End stage renal disease: Secondary | ICD-10-CM

## 2019-03-21 DIAGNOSIS — T82898S Other specified complication of vascular prosthetic devices, implants and grafts, sequela: Secondary | ICD-10-CM

## 2019-03-21 DIAGNOSIS — I10 Essential (primary) hypertension: Secondary | ICD-10-CM

## 2019-03-21 DIAGNOSIS — Z992 Dependence on renal dialysis: Secondary | ICD-10-CM

## 2019-03-21 NOTE — Assessment & Plan Note (Signed)
Her left radiocephalic AV fistula has failed and at this point does not appear salvageable. Her vein mapping today shows the true cephalic vein in the left upper arm to remain patent and of adequate size and would still be usable for fistula creation At this point, a left brachiocephalic AV fistula would likely be our best option.  Risks and benefits are discussed and she is agreeable to proceed.

## 2019-03-21 NOTE — Progress Notes (Signed)
MRN : [id number]  Jennifer Newman is a 53 y.o. ([birth year]) female who presents with chief complaint of  Chief Complaint  Patient presents with   Follow-up    ref [place] for vein mapping  .  History of Present Illness: Patient returns today in follow up of her dialysis access.  She had a left radiocephalic AV fistula created which thrombosed.  We performed a percutaneous intervention on that last week and initially got it open, but it had an early rethrombosis.  With that, her left radiocephalic AV fistula is clearly not salvageable.  We are now looking for a new access.  Her vein mapping today shows the true cephalic vein in the left upper arm to remain patent and of adequate size and would still be usable for fistula creation  Current Outpatient Medications  Medication Sig Dispense Refill   amLODipine (NORVASC) 10 MG tablet Take 1 tablet by mouth 1 day or 1 dose.     butalbital-aspirin-caffeine (FIORINAL) 50-325-40 MG capsule Take 1 capsule by mouth 2 (two) times daily as needed for headache (for migraine).     carvedilol (COREG) 25 MG tablet Take 1 tablet by mouth once.      clonazePAM (KLONOPIN) 0.5 MG tablet Take 1 tablet by mouth daily as needed.      ferric citrate (AURYXIA) 1 GM 210 MG(Fe) tablet Take 420 mg by mouth 3 (three) times daily with meals.     fluticasone (FLONASE) 50 MCG/ACT nasal spray Place 1 spray into both nostrils 2 (two) times daily.     HYDROcodone-acetaminophen (NORCO) 5-325 MG tablet Take 1 tablet by mouth every 6 (six) hours as needed for moderate pain. 30 tablet 0   losartan (COZAAR) 100 MG tablet Take 25 mg by mouth every evening.      nortriptyline (PAMELOR) 10 MG capsule Take 2 capsules by mouth at bedtime.      sertraline (ZOLOFT) 25 MG tablet Take 1 tablet by mouth 1 day or 1 dose.     Vitamin D, Ergocalciferol, (DRISDOL) 1.25 MG (50000 UT) CAPS capsule Take 1 capsule by mouth once a week.     No current facility-administered medications  for this visit.     Past Medical History:  Diagnosis Date   Anxiety    Bipolar disorder ([place])    Chronic kidney disease    Depression    Dysrhythmia    tachycardia   GERD (gastroesophageal reflux disease)    Hypertension    Obsessive-compulsive disorder    PTSD (post-traumatic stress disorder)    Tachycardia, unspecified    Vertigo     Past Surgical History:  Procedure Laterality Date   AV FISTULA PLACEMENT Left 02/26/2019   Procedure: ARTERIOVENOUS (AV) FISTULA CREATION ( RADIAL CEPHALIC );  Surgeon: Algernon Huxley, MD;  Location: [facility];  Service: Vascular;  Laterality: Left;   DIALYSIS/PERMA CATHETER INSERTION N/A 08/22/2018   Procedure: DIALYSIS temporary catheter INSERTION;  Surgeon: Algernon Huxley, MD;  Location: [place] [facility];  Service: Cardiovascular;  Laterality: N/A;   DIALYSIS/PERMA CATHETER INSERTION N/A 08/26/2018   Procedure: DIALYSIS/PERMA CATHETER INSERTION;  Surgeon: Algernon Huxley, MD;  Location: [place] [facility];  Service: Cardiovascular;  Laterality: N/A;   DIALYSIS/PERMA CATHETER INSERTION N/A 01/01/2019   Procedure: DIALYSIS/PERMA CATHETER INSERTION;  Surgeon: Algernon Huxley, MD;  Location: [place] [facility];  Service: Cardiovascular;  Laterality: N/A;   none     PERIPHERAL VASCULAR THROMBECTOMY Left 03/12/2019   Procedure: PERIPHERAL  VASCULAR THROMBECTOMY;  Surgeon: Algernon Huxley, MD;  Location: [place] [facility];  Service: Cardiovascular;  Laterality: Left;    Social History Social History   Tobacco Use   Smoking status: Never Smoker   Smokeless tobacco: Never Used  Substance Use Topics   Alcohol use: Not Currently    Alcohol/week: 0.0 standard drinks   Drug use: Not Currently    Types: Marijuana    Comment: Last used several weeks back      Family History Family History  Problem Relation Age of Onset   COPD Neg Hx    Diabetes Neg Hx    Hypertension Neg Hx    CAD Neg Hx     No Known Allergies   REVIEW OF  SYSTEMS (Negative unless checked)  Constitutional: [] Weight loss  [] Fever  [] Chills Cardiac: [] Chest pain   [] Chest pressure   [x] Palpitations   [] Shortness of breath when laying flat   [] Shortness of breath at rest   [x] Shortness of breath with exertion. Vascular:  [] Pain in legs with walking   [] Pain in legs at rest   [] Pain in legs when laying flat   [] Claudication   [] Pain in feet when walking  [] Pain in feet at rest  [] Pain in feet when laying flat   [] History of DVT   [] Phlebitis   [] Swelling in legs   [] Varicose veins   [] Non-healing ulcers Pulmonary:   [] Uses home oxygen   [] Productive cough   [] Hemoptysis   [] Wheeze  [] COPD   [] Asthma Neurologic:  [] Dizziness  [] Blackouts   [] Seizures   [] History of stroke   [] History of TIA  [] Aphasia   [] Temporary blindness   [] Dysphagia   [] Weakness or numbness in arms   [] Weakness or numbness in legs Musculoskeletal:  [] Arthritis   [] Joint swelling   [] Joint pain   [] Low back pain Hematologic:  [] Easy bruising  [] Easy bleeding   [] Hypercoagulable state   [x] Anemic   Gastrointestinal:  [] Blood in stool   [] Vomiting blood  [x] Gastroesophageal reflux/heartburn   [] Abdominal pain Genitourinary:  [x] Chronic kidney disease   [] Difficult urination  [] Frequent urination  [] Burning with urination   [] Hematuria Skin:  [] Rashes   [] Ulcers   [] Wounds Psychological:  [x] History of anxiety   [x]  History of major depression.  Physical Examination  BP 133/88 (BP Location: Right Arm)    Pulse 90    Resp 16    Ht 5\' 3"  (1.6 m)    Wt 209 lb 12.8 oz (95.2 kg)    LMP 08/31/2018    BMI 37.16 kg/m  Gen:  WD/WN, NAD Head: [place]/AT, No temporalis wasting. Ear/Nose/Throat: Hearing grossly intact, nares w/o erythema or drainage Eyes: Conjunctiva clear. Sclera non-icteric Neck: Supple.  Trachea midline Pulmonary:  Good air movement, no use of accessory muscles.  Cardiac: RRR, no JVD Vascular: No thrill in left radiocephalic AV fistula Vessel Right Left  Radial Palpable  Palpable                   Musculoskeletal: M/S 5/5 throughout.  No deformity or atrophy.  Neurologic: Sensation grossly intact in extremities.  Symmetrical.  Speech is fluent.  Psychiatric: Judgment intact, Mood & affect appropriate for pt's clinical situation. Dermatologic: No rashes or ulcers noted.  No cellulitis or open wounds.       Labs Recent Results (from the past 2160 hour(s))  SARS Coronavirus 2 (CEPHEID - Performed in [place] hospital lab), [facility] Order     Status: None   Collection  Time: 12/31/18 12:37 PM   Specimen: Nasopharyngeal Swab  Result Value Ref Range   SARS Coronavirus 2 NEGATIVE NEGATIVE    Comment: (NOTE) If result is NEGATIVE SARS-CoV-2 target nucleic acids are NOT DETECTED. The SARS-CoV-2 RNA is generally detectable in upper and lower  respiratory specimens during the acute phase of infection. The lowest  concentration of SARS-CoV-2 viral copies this assay can detect is 250  copies / mL. A negative result does not preclude SARS-CoV-2 infection  and should not be used as the sole basis for treatment or other  patient management decisions.  A negative result may occur with  improper specimen collection / handling, submission of specimen other  than nasopharyngeal swab, presence of viral mutation(s) within the  areas targeted by this assay, and inadequate number of viral copies  (<250 copies / mL). A negative result must be combined with clinical  observations, patient history, and epidemiological information. If result is POSITIVE SARS-CoV-2 target nucleic acids are DETECTED. The SARS-CoV-2 RNA is generally detectable in upper and lower  respiratory specimens dur ing the acute phase of infection.  Positive  results are indicative of active infection with SARS-CoV-2.  Clinical  correlation with patient history and other diagnostic information is  necessary to determine patient infection status.  Positive results do  not rule out bacterial  infection or co-infection with other viruses. If result is PRESUMPTIVE POSTIVE SARS-CoV-2 nucleic acids MAY BE PRESENT.   A presumptive positive result was obtained on the submitted specimen  and confirmed on repeat testing.  While 2019 novel coronavirus  (SARS-CoV-2) nucleic acids may be present in the submitted sample  additional confirmatory testing may be necessary for epidemiological  and / or clinical management purposes  to differentiate between  SARS-CoV-2 and other Sarbecovirus currently known to infect humans.  If clinically indicated additional testing with an alternate test  methodology [phone]) is advised. The SARS-CoV-2 RNA is generally  detectable in upper and lower respiratory sp ecimens during the acute  phase of infection. The expected result is Negative. Fact Sheet for Patients:  StrictlyIdeas.no Fact Sheet for Healthcare Providers: BankingDealers.co.za This test is not yet approved or cleared by the [place] FDA and has been authorized for detection and/or diagnosis of SARS-CoV-2 by FDA under an Emergency Use Authorization (EUA).  This EUA will remain in effect (meaning this test can be used) for the duration of the COVID-19 declaration under Section 564(b)(1) of the Act, 21 U.S.C. section 360bbb-3(b)(1), unless the authorization is terminated or revoked sooner. Performed at [facility], [address], [place], [place] [address]   Potassium [facility] vascular lab only)     Status: None   Collection Time: 01/01/19  7:50 AM  Result Value Ref Range   Potassium [facility] vascular lab) 4.5 3.5 - 5.1    Comment: Performed at [facility], [address] [place], [place] [address]  APTT     Status: None   Collection Time: 01/31/19  2:34 PM  Result Value Ref Range   aPTT 28 24 - 36 seconds    Comment: Performed at [facility], [address] [place], [place] [address]  Basic metabolic  panel     Status: Abnormal   Collection Time: 01/31/19  2:34 PM  Result Value Ref Range   Sodium 136 135 - 145 mmol/L   Potassium 3.3 (L) 3.5 - 5.1 mmol/L   Chloride 97 (L) 98 - 111 mmol/L   CO2 27 22 - 32 mmol/L  Glucose, Bld 112 (H) 70 - 99 mg/dL   BUN 32 (H) 6 - 20 mg/dL   Creatinine, Ser 4.19 (H) 0.44 - 1.00 mg/dL   Calcium 9.6 8.9 - 10.3 mg/dL   GFR calc non Af Amer 12 (L) >60 mL/min   GFR calc Af Amer 13 (L) >60 mL/min   Anion gap 12 5 - 15    Comment: Performed at [facility], [address] [place], [place] [address]  CBC WITH DIFFERENTIAL     Status: Abnormal   Collection Time: 01/31/19  2:34 PM  Result Value Ref Range   WBC 7.4 4.0 - 10.5 K/uL   RBC 4.15 3.87 - 5.11 MIL/uL   Hemoglobin 10.1 (L) 12.0 - 15.0 g/dL   HCT 32.7 (L) 36.0 - 46.0 %   MCV 78.8 (L) 80.0 - 100.0 fL   MCH 24.3 (L) 26.0 - 34.0 pg   MCHC 30.9 30.0 - 36.0 g/dL   RDW 17.2 (H) 11.5 - 15.5 %   Platelets 301 150 - 400 K/uL   nRBC 0.0 0.0 - 0.2 %   Neutrophils Relative % 62 %   Neutro Abs 4.6 1.7 - 7.7 K/uL   Lymphocytes Relative 19 %   Lymphs Abs 1.4 0.7 - 4.0 K/uL   Monocytes Relative 15 %   Monocytes Absolute 1.1 (H) 0.1 - 1.0 K/uL   Eosinophils Relative 3 %   Eosinophils Absolute 0.2 0.0 - 0.5 K/uL   Basophils Relative 1 %   Basophils Absolute 0.1 0.0 - 0.1 K/uL   Immature Granulocytes 0 %   Abs Immature Granulocytes 0.03 0.00 - 0.07 K/uL    Comment: Performed at [facility], [address] [place], [place] [address]  Protime-INR     Status: None   Collection Time: 01/31/19  2:34 PM  Result Value Ref Range   Prothrombin Time 12.8 11.4 - 15.2 seconds   INR 1.0 0.8 - 1.2    Comment: (NOTE) INR goal varies based on device and disease states. Performed at [facility], [address] [place], [place] [address]   Type and screen     Status: None   Collection Time: 01/31/19  2:34 PM  Result Value Ref Range   ABO/RH(D) A POS    Antibody Screen NEG     Sample Expiration 02/14/2019,2359    Extend sample reason      NO TRANSFUSIONS OR PREGNANCY IN THE PAST 3 MONTHS Performed at [facility], [address] [place], [place] [address]   SARS CORONAVIRUS 2 Nasal Swab Aptima Multi Swab     Status: None   Collection Time: 02/21/19  1:10 PM   Specimen: Aptima Multi Swab; Nasal Swab  Result Value Ref Range   SARS Coronavirus 2 NEGATIVE NEGATIVE    Comment: (NOTE) SARS-CoV-2 target nucleic acids are NOT DETECTED. The SARS-CoV-2 RNA is generally detectable in upper and lower respiratory specimens during the acute phase of infection. Negative results do not preclude SARS-CoV-2 infection, do not rule out co-infections with other pathogens, and should not be used as the sole basis for treatment or other patient management decisions. Negative results must be combined with clinical observations, patient history, and epidemiological information. The expected result is Negative. Fact Sheet for Patients: SugarRoll.be Fact Sheet for Healthcare Providers: https://www.woods-mathews.com/ This test is not yet approved or cleared by the [place] FDA and  has been authorized for detection and/or diagnosis of SARS-CoV-2 by FDA under an Emergency Use Authorization (EUA).  This EUA will remain  in effect (meaning this test can be used) for the duration of the COVID-19 declaration under Section 56 4(b)(1) of the Act, 21 U.S.C. section 360bbb-3(b)(1), unless the authorization is terminated or revoked sooner. Performed at [facility], [place] [address] [place], [place] [address]   Pregnancy, urine POC     Status: None   Collection Time: 02/26/19  8:34 AM  Result Value Ref Range   Preg Test, Ur NEGATIVE NEGATIVE    Comment:        THE SENSITIVITY OF THIS METHODOLOGY IS >24 mIU/mL   Urine Drug Screen, Qualitative ([facility] only)     Status: Abnormal   Collection Time: 02/26/19  8:36 AM  Result  Value Ref Range   Tricyclic, Ur Screen POSITIVE (A) NONE DETECTED   Amphetamines, Ur Screen NONE DETECTED NONE DETECTED   MDMA (Ecstasy)Ur Screen NONE DETECTED NONE DETECTED   Cocaine Metabolite,Ur [place] NONE DETECTED NONE DETECTED   Opiate, Ur Screen NONE DETECTED NONE DETECTED   Phencyclidine (PCP) Ur S NONE DETECTED NONE DETECTED   Cannabinoid 50 Ng, Ur [place] NONE DETECTED NONE DETECTED   Barbiturates, Ur Screen POSITIVE (A) NONE DETECTED   Benzodiazepine, Ur Scrn NONE DETECTED NONE DETECTED   Methadone Scn, Ur NONE DETECTED NONE DETECTED    Comment: (NOTE) Tricyclics + metabolites, urine    Cutoff 1000 ng/mL Amphetamines + metabolites, urine  Cutoff 1000 ng/mL MDMA (Ecstasy), urine              Cutoff 500 ng/mL Cocaine Metabolite, urine          Cutoff 300 ng/mL Opiate + metabolites, urine        Cutoff 300 ng/mL Phencyclidine (PCP), urine         Cutoff 25 ng/mL Cannabinoid, urine                 Cutoff 50 ng/mL Barbiturates + metabolites, urine  Cutoff 200 ng/mL Benzodiazepine, urine              Cutoff 200 ng/mL Methadone, urine                   Cutoff 300 ng/mL The urine drug screen provides only a preliminary, unconfirmed analytical test result and should not be used for non-medical purposes. Clinical consideration and professional judgment should be applied to any positive drug screen result due to possible interfering substances. A more specific alternate chemical method must be used in order to obtain a confirmed analytical result. Gas chromatography / mass spectrometry (GC/MS) is the preferred confirmat ory method. Performed at [facility], [address] [place], [place] [address]   APTT     Status: None   Collection Time: 02/26/19  8:49 AM  Result Value Ref Range   aPTT 27 24 - 36 seconds    Comment: Performed at [facility], [address] [place], [place] [address]  Basic metabolic panel     Status: Abnormal   Collection Time: 02/26/19  8:49  AM  Result Value Ref Range   Sodium 139 135 - 145 mmol/L   Potassium 3.9 3.5 - 5.1 mmol/L   Chloride 100 98 - 111 mmol/L   CO2 27 22 - 32 mmol/L   Glucose, Bld 107 (H) 70 - 99 mg/dL   BUN 27 (H) 6 - 20 mg/dL   Creatinine, Ser 3.31 (H) 0.44 - 1.00 mg/dL   Calcium 9.9 8.9 - 10.3 mg/dL   GFR  calc non Af Amer 15 (L) >60 mL/min   GFR calc Af Amer 18 (L) >60 mL/min   Anion gap 12 5 - 15    Comment: Performed at [facility], [address] [place], [place] [address]  CBC WITH DIFFERENTIAL     Status: Abnormal   Collection Time: 02/26/19  8:49 AM  Result Value Ref Range   WBC 6.5 4.0 - 10.5 K/uL   RBC 4.16 3.87 - 5.11 MIL/uL   Hemoglobin 11.1 (L) 12.0 - 15.0 g/dL   HCT 35.8 (L) 36.0 - 46.0 %   MCV 86.1 80.0 - 100.0 fL   MCH 26.7 26.0 - 34.0 pg   MCHC 31.0 30.0 - 36.0 g/dL   RDW 24.5 (H) 11.5 - 15.5 %   Platelets 279 150 - 400 K/uL   nRBC 0.0 0.0 - 0.2 %   Neutrophils Relative % 62 %   Neutro Abs 4.0 1.7 - 7.7 K/uL   Lymphocytes Relative 21 %   Lymphs Abs 1.4 0.7 - 4.0 K/uL   Monocytes Relative 13 %   Monocytes Absolute 0.8 0.1 - 1.0 K/uL   Eosinophils Relative 3 %   Eosinophils Absolute 0.2 0.0 - 0.5 K/uL   Basophils Relative 1 %   Basophils Absolute 0.1 0.0 - 0.1 K/uL   RBC Morphology MIXED RBC POPULATION    Immature Granulocytes 0 %   Abs Immature Granulocytes 0.02 0.00 - 0.07 K/uL    Comment: Performed at [facility], [address] [place], [place] [address]  Protime-INR     Status: None   Collection Time: 02/26/19  8:49 AM  Result Value Ref Range   Prothrombin Time 12.4 11.4 - 15.2 seconds   INR 0.9 0.8 - 1.2    Comment: (NOTE) INR goal varies based on device and disease states. Performed at [facility], [address] [place],  [address]   Type and screen     Status: None   Collection Time: 02/26/19  8:49 AM  Result Value Ref Range   ABO/RH(D) A POS    Antibody Screen NEG    Sample Expiration       03/01/2019,2359 Performed at [facility], [address] [place], [place] [address]   I-STAT 4, (NA,K, GLUC, HGB,HCT)     Status: Abnormal   Collection Time: 02/26/19  8:59 AM  Result Value Ref Range   Sodium 137 135 - 145 mmol/L   Potassium 4.0 3.5 - 5.1 mmol/L   Glucose, Bld 107 (H) 70 - 99 mg/dL   HCT 38.0 36.0 - 46.0 %   Hemoglobin 12.9 12.0 - 15.0 g/dL  Glucose, capillary     Status: None   Collection Time: 02/26/19 11:33 AM  Result Value Ref Range   Glucose-Capillary 94 70 - 99 mg/dL  SARS CORONAVIRUS 2 (TAT 6-24 HRS) Nasopharyngeal Nasopharyngeal Swab     Status: None   Collection Time: 03/07/19  2:13 PM   Specimen: Nasopharyngeal Swab  Result Value Ref Range   SARS Coronavirus 2 NEGATIVE NEGATIVE    Comment: (NOTE) SARS-CoV-2 target nucleic acids are NOT DETECTED. The SARS-CoV-2 RNA is generally detectable in upper and lower respiratory specimens during the acute phase of infection. Negative results do not preclude SARS-CoV-2 infection, do not rule out co-infections with other pathogens, and should not be used as the sole basis for treatment or other patient management decisions. Negative results must be combined with clinical observations, patient history, and epidemiological information. The expected  result is Negative. Fact Sheet for Patients: SugarRoll.be Fact Sheet for Healthcare Providers: https://www.woods-mathews.com/ This test is not yet approved or cleared by the [place] FDA and  has been authorized for detection and/or diagnosis of SARS-CoV-2 by FDA under an Emergency Use Authorization (EUA). This EUA will remain  in effect (meaning this test can be used) for the duration of the COVID-19 declaration under Section 56 4(b)(1) of the Act, 21 U.S.C. section 360bbb-3(b)(1), unless the authorization is terminated or revoked sooner. Performed at [facility], [place] [address], [place],  [place] [address]   Potassium [facility] vascular lab only)     Status: None   Collection Time: 03/12/19 12:36 PM  Result Value Ref Range   Potassium [facility] vascular lab) 4.3 3.5 - 5.1    Comment: Performed at [facility], [address] [place], [place] [address]    Radiology [place] Or Nerve Block-image Only ([facility])  Result Date: 02/26/2019 There is no interpretation for this exam.  This order is for images obtained during a surgical procedure.  Please See "Surgeries" Tab for more information regarding the procedure.   Vas US Duplex Dialysis Access (avf, Avg)  Result Date: 03/11/2019 DIALYSIS ACCESS Reason for Exam: Non-maturation of AVF. Access Site: Left Upper Extremity. Access Type: Radial-cephalic AVF. History: New left RadioCephalic AVF created 10/02/270 with no bruit thrill at          anastomosis site. Performing Technologist: Concha Norway RVT  Examination Guidelines: A complete evaluation includes B-mode imaging, spectral Doppler, color Doppler, and power Doppler as needed of all accessible portions of each vessel. Unilateral testing is considered an integral part of a complete examination. Limited examinations for reoccurring indications may be performed as noted.  Findings: +--------------------+----------+-----------------+--------+  AVF                  PSV (cm/s) Flow Vol (mL/min) Comments  +--------------------+----------+-----------------+--------+  Native artery inflow    173            250                  +--------------------+----------+-----------------+--------+  AVF Anastomosis         156                                 +--------------------+----------+-----------------+--------+  +------------+----------+-------------+----------+-----------------------------+  OUTFLOW VEIN PSV (cm/s) Diameter (cm) Depth (cm)           Describe             +------------+----------+-------------+----------+-----------------------------+  AC Fossa         45                                                              +------------+----------+-------------+----------+-----------------------------+  Prox Forearm     32                                                             +------------+----------+-------------+----------+-----------------------------+  Mid Forearm      44                                                             +------------+----------+-------------+----------+-----------------------------+  Dist Forearm     0                                occluded and thrombus seen                                                           occluded just past                                                          anastomosis site with minimal                                                    flow seen distal to thrombus.  +------------+----------+-------------+----------+-----------------------------+  +--------------+-------------+---------+---------+----------+------------------+                 Diameter (cm)   Depth   Branching PSV (cm/s)    Flow Volume                                      (cm)                              (ml/min)       +--------------+-------------+---------+---------+----------+------------------+  Left Rad Art                                         88                          dis                                                                             +--------------+-------------+---------+---------+----------+------------------+  Summary: New left RadioCephalic AVF with thrombus seen at the origin of the AVF just distal to arterial anastomosis site. No flow seen initially then low flow seen from mid to distal forearm throughout the AVF.  *See table(s) above for measurements and observations.  Diagnosing physician: Leotis Pain MD Electronically signed by Leotis Pain MD on 03/11/2019 at 3:03:17 PM.    --------------------------------------------------------------------------------   Final     Assessment/Plan  Essential hypertension blood pressure control important in reducing  the progression of atherosclerotic disease. On appropriate oral medications.   ESRD on dialysis [facility]) Her left radiocephalic AV fistula has failed and at this point does not appear salvageable. Her vein mapping today shows the true cephalic  vein in the left upper arm to remain patent and of adequate size and would still be usable for fistula creation At this point, a left brachiocephalic AV fistula would likely be our best option.  Risks and benefits are discussed and she is agreeable to proceed.    Leotis Pain, MD  03/21/2019 11:49 AM    This note was created with Dragon medical transcription system.  Any errors from dictation are purely unintentional

## 2019-03-21 NOTE — Assessment & Plan Note (Signed)
blood pressure control important in reducing the progression of atherosclerotic disease. On appropriate oral medications.  

## 2019-03-31 ENCOUNTER — Telehealth (INDEPENDENT_AMBULATORY_CARE_PROVIDER_SITE_OTHER): Payer: Self-pay

## 2019-03-31 NOTE — Telephone Encounter (Signed)
I spoke with the patient earlier and she is scheduled with Dr. Lucky Cowboy for surgery on 04/16/2019 at the [facility]. Patient will do her Covid testing and pre-op on 04/11/2019 at 1:45 pm at the [facility]. A message was left per patient's request with the appt's the pre-surgical instructions will also be mailed to the patient.

## 2019-04-07 ENCOUNTER — Other Ambulatory Visit: Payer: BC Managed Care – PPO

## 2019-04-09 ENCOUNTER — Ambulatory Visit (INDEPENDENT_AMBULATORY_CARE_PROVIDER_SITE_OTHER): Payer: BC Managed Care – PPO | Admitting: Nurse Practitioner

## 2019-04-09 ENCOUNTER — Encounter (INDEPENDENT_AMBULATORY_CARE_PROVIDER_SITE_OTHER): Payer: BC Managed Care – PPO

## 2019-04-10 ENCOUNTER — Other Ambulatory Visit (INDEPENDENT_AMBULATORY_CARE_PROVIDER_SITE_OTHER): Payer: Self-pay | Admitting: Nurse Practitioner

## 2019-04-11 ENCOUNTER — Encounter
Admission: RE | Admit: 2019-04-11 | Discharge: 2019-04-11 | Disposition: A | Discharge status: Home / Self Care | Payer: BC Managed Care – PPO | Source: Ambulatory Visit | Attending: Vascular Surgery | Admitting: Vascular Surgery

## 2019-04-11 ENCOUNTER — Other Ambulatory Visit: Payer: Self-pay

## 2019-04-11 DIAGNOSIS — N186 End stage renal disease: Secondary | ICD-10-CM | POA: Diagnosis not present

## 2019-04-11 DIAGNOSIS — Z01812 Encounter for preprocedural laboratory examination: Secondary | ICD-10-CM | POA: Diagnosis present

## 2019-04-11 DIAGNOSIS — Z20828 Contact with and (suspected) exposure to other viral communicable diseases: Secondary | ICD-10-CM | POA: Insufficient documentation

## 2019-04-11 LAB — TYPE AND SCREEN
ABO/RH(D): A POS
Antibody Screen: NEGATIVE

## 2019-04-11 LAB — CBC WITH DIFFERENTIAL/PLATELET
Abs Immature Granulocytes: 0.03 10*3/uL (ref 0.00–0.07)
Basophils Absolute: 0 10*3/uL (ref 0.0–0.1)
Basophils Relative: 1 %
Eosinophils Absolute: 0.3 10*3/uL (ref 0.0–0.5)
Eosinophils Relative: 4 %
HCT: 38.3 % (ref 36.0–46.0)
Hemoglobin: 12.6 g/dL (ref 12.0–15.0)
Immature Granulocytes: 0 %
Lymphocytes Relative: 16 %
Lymphs Abs: 1.2 10*3/uL (ref 0.7–4.0)
MCH: 29.5 pg (ref 26.0–34.0)
MCHC: 32.9 g/dL (ref 30.0–36.0)
MCV: 89.7 fL (ref 80.0–100.0)
Monocytes Absolute: 1 10*3/uL (ref 0.1–1.0)
Monocytes Relative: 13 %
Neutro Abs: 5.1 10*3/uL (ref 1.7–7.7)
Neutrophils Relative %: 66 %
Platelets: 264 10*3/uL (ref 150–400)
RBC: 4.27 MIL/uL (ref 3.87–5.11)
RDW: 19.8 % — ABNORMAL HIGH (ref 11.5–15.5)
WBC: 7.7 10*3/uL (ref 4.0–10.5)
nRBC: 0 % (ref 0.0–0.2)

## 2019-04-11 LAB — PROTIME-INR
INR: 0.9 (ref 0.8–1.2)
Prothrombin Time: 12.3 seconds (ref 11.4–15.2)

## 2019-04-11 LAB — APTT: aPTT: 28 seconds (ref 24–36)

## 2019-04-11 LAB — BASIC METABOLIC PANEL
Anion gap: 15 (ref 5–15)
BUN: 46 mg/dL — ABNORMAL HIGH (ref 6–20)
CO2: 26 mmol/L (ref 22–32)
Calcium: 9.9 mg/dL (ref 8.9–10.3)
Chloride: 98 mmol/L (ref 98–111)
Creatinine, Ser: 4.14 mg/dL — ABNORMAL HIGH (ref 0.44–1.00)
GFR calc Af Amer: 14 mL/min — ABNORMAL LOW (ref >60)
GFR calc non Af Amer: 12 mL/min — ABNORMAL LOW (ref >60)
Glucose, Bld: 98 mg/dL (ref 70–99)
Potassium: 3.5 mmol/L (ref 3.5–5.1)
Sodium: 139 mmol/L (ref 135–145)

## 2019-04-11 NOTE — Patient Instructions (Signed)
Your procedure is scheduled on: Wednesday 04/16/19 Report to [facility]. To find out your arrival time please call [phone] between 1PM - 3PM on Tuesday 04/15/19.  Remember: Instructions that are not followed completely may result in serious medical risk, up to and including death, or upon the discretion of your surgeon and anesthesiologist your surgery may need to be rescheduled.     _X__ 1. Do not eat food after midnight the night before your procedure.                 No gum chewing or hard candies. You may drink clear liquids up to 2 hours                 before you are scheduled to arrive for your surgery- DO not drink clear                 liquids within 2 hours of the start of your surgery.                 Clear Liquids include:  water, apple juice without pulp, clear carbohydrate                 drink such as Clearfast or Gatorade, Black Coffee or Tea (Do not add                 anything to coffee or tea). Diabetics water only  __X__2.  On the morning of surgery brush your teeth with toothpaste and water, you                 may rinse your mouth with mouthwash if you wish.  Do not swallow any              toothpaste of mouthwash.     _X__ 3.  No Alcohol for 24 hours before or after surgery.   _X__ 4.  Do Not Smoke or use e-cigarettes For 24 Hours Prior to Your Surgery.                 Do not use any chewable tobacco products for at least 6 hours prior to                 surgery.  ____  5.  Bring all medications with you on the day of surgery if instructed.   __X__  6.  Notify your doctor if there is any change in your medical condition      (cold, fever, infections).     Do not wear jewelry, make-up, hairpins, clips or nail polish. Do not wear lotions, powders, or perfumes.  Do not shave 48 hours prior to surgery. Men may shave face and neck. Do not bring valuables to the hospital.    [facility] is not responsible  for any belongings or valuables.  Contacts, dentures/partials or body piercings may not be worn into surgery. Bring a case for your contacts, glasses or hearing aids, a denture cup will be supplied. Leave your suitcase in the car. After surgery it may be brought to your room. For patients admitted to the hospital, discharge time is determined by your treatment team.   Patients discharged the day of surgery will not be allowed to drive home.   Please read over the following fact sheets that you were given:   MRSA Information  __X__ Take these medicines the morning of surgery with A SIP OF WATER:  1. amLODipine (NORVASC  2. sertraline (ZOLOFT  3. clonazePAM (KLONOPIN if needed  4.  5.  6.  ____ Fleet Enema (as directed)   __X__ Use CHG Soap/SAGE wipes as directed  ____ Use inhalers on the day of surgery  ____ Stop metformin/Janumet/Farxiga 2 days prior to surgery    ____ Take 1/2 of usual insulin dose the night before surgery. No insulin the morning          of surgery.   ____ Stop Blood Thinners Coumadin/Plavix/Xarelto/Pleta/Pradaxa/Eliquis/Effient/Aspirin  on   Or contact your Surgeon, Cardiologist or Medical Doctor regarding  ability to stop your blood thinners  __X__ Stop Anti-inflammatories 7 days before surgery such as Advil, Ibuprofen, Motrin,  BC or Goodies Powder, Naprosyn, Naproxen, Aleve, Aspirin    __X__ Stop all herbal supplements, fish oil or vitamin E until after surgery.    ____ Bring C-Pap to the hospital.

## 2019-04-12 LAB — SARS CORONAVIRUS 2 (TAT 6-24 HRS): SARS Coronavirus 2: NEGATIVE

## 2019-04-16 ENCOUNTER — Ambulatory Visit: Payer: BC Managed Care – PPO | Admitting: Anesthesiology

## 2019-04-16 ENCOUNTER — Ambulatory Visit
Admission: RE | Admit: 2019-04-16 | Discharge: 2019-04-16 | Disposition: A | Discharge status: Home / Self Care | Payer: BC Managed Care – PPO | Attending: Vascular Surgery | Admitting: Vascular Surgery

## 2019-04-16 ENCOUNTER — Encounter: Payer: Self-pay | Admitting: *Deleted

## 2019-04-16 ENCOUNTER — Other Ambulatory Visit: Payer: Self-pay

## 2019-04-16 ENCOUNTER — Encounter
Admission: RE | Disposition: A | Discharge status: Home / Self Care | Payer: Self-pay | Source: Home / Self Care | Attending: Vascular Surgery

## 2019-04-16 DIAGNOSIS — R519 Headache, unspecified: Secondary | ICD-10-CM | POA: Insufficient documentation

## 2019-04-16 DIAGNOSIS — F319 Bipolar disorder, unspecified: Secondary | ICD-10-CM | POA: Insufficient documentation

## 2019-04-16 DIAGNOSIS — X58XXXA Exposure to other specified factors, initial encounter: Secondary | ICD-10-CM | POA: Diagnosis not present

## 2019-04-16 DIAGNOSIS — N185 Chronic kidney disease, stage 5: Secondary | ICD-10-CM | POA: Diagnosis not present

## 2019-04-16 DIAGNOSIS — K219 Gastro-esophageal reflux disease without esophagitis: Secondary | ICD-10-CM | POA: Diagnosis not present

## 2019-04-16 DIAGNOSIS — N186 End stage renal disease: Secondary | ICD-10-CM | POA: Insufficient documentation

## 2019-04-16 DIAGNOSIS — F4310 Post-traumatic stress disorder, unspecified: Secondary | ICD-10-CM | POA: Insufficient documentation

## 2019-04-16 DIAGNOSIS — Z992 Dependence on renal dialysis: Secondary | ICD-10-CM | POA: Insufficient documentation

## 2019-04-16 DIAGNOSIS — F419 Anxiety disorder, unspecified: Secondary | ICD-10-CM | POA: Insufficient documentation

## 2019-04-16 DIAGNOSIS — R42 Dizziness and giddiness: Secondary | ICD-10-CM | POA: Diagnosis not present

## 2019-04-16 DIAGNOSIS — R000 Tachycardia, unspecified: Secondary | ICD-10-CM | POA: Diagnosis not present

## 2019-04-16 DIAGNOSIS — T82868A Thrombosis of vascular prosthetic devices, implants and grafts, initial encounter: Secondary | ICD-10-CM | POA: Insufficient documentation

## 2019-04-16 DIAGNOSIS — Z79899 Other long term (current) drug therapy: Secondary | ICD-10-CM | POA: Insufficient documentation

## 2019-04-16 DIAGNOSIS — F429 Obsessive-compulsive disorder, unspecified: Secondary | ICD-10-CM | POA: Diagnosis not present

## 2019-04-16 DIAGNOSIS — I120 Hypertensive chronic kidney disease with stage 5 chronic kidney disease or end stage renal disease: Secondary | ICD-10-CM | POA: Diagnosis not present

## 2019-04-16 HISTORY — PX: AV FISTULA PLACEMENT: SHX1204

## 2019-04-16 LAB — POCT PREGNANCY, URINE: Preg Test, Ur: NEGATIVE

## 2019-04-16 LAB — POCT I-STAT, CHEM 8
BUN: 25 mg/dL — ABNORMAL HIGH (ref 6–20)
Calcium, Ion: 1.23 mmol/L (ref 1.15–1.40)
Chloride: 105 mmol/L (ref 98–111)
Creatinine, Ser: 2.9 mg/dL — ABNORMAL HIGH (ref 0.44–1.00)
Glucose, Bld: 103 mg/dL — ABNORMAL HIGH (ref 70–99)
HCT: 39 % (ref 36.0–46.0)
Hemoglobin: 13.3 g/dL (ref 12.0–15.0)
Potassium: 3.8 mmol/L (ref 3.5–5.1)
Sodium: 138 mmol/L (ref 135–145)
TCO2: 24 mmol/L (ref 22–32)

## 2019-04-16 SURGERY — ARTERIOVENOUS (AV) FISTULA CREATION
Anesthesia: General | Site: Arm Upper | Laterality: Left

## 2019-04-16 MED ORDER — FENTANYL CITRATE (PF) 100 MCG/2ML IJ SOLN
INTRAMUSCULAR | Status: AC
  Administered 2019-04-16: 25 ug via INTRAVENOUS
  Filled 2019-04-16: qty 2

## 2019-04-16 MED ORDER — CHLORHEXIDINE GLUCONATE CLOTH 2 % EX PADS: 6.0000 | MEDICATED_PAD | Freq: Once | CUTANEOUS | Status: DC

## 2019-04-16 MED ORDER — CEFAZOLIN SODIUM-DEXTROSE 1-4 GM/50ML-% IV SOLN
INTRAVENOUS | Status: AC
  Filled 2019-04-16: qty 50

## 2019-04-16 MED ORDER — BUPIVACAINE-EPINEPHRINE (PF) 0.5% -1:200000 IJ SOLN
INTRAMUSCULAR | Status: AC
  Filled 2019-04-16: qty 30

## 2019-04-16 MED ORDER — FAMOTIDINE 20 MG PO TABS
ORAL_TABLET | ORAL | Status: AC
  Administered 2019-04-16: 20 mg via ORAL
  Filled 2019-04-16: qty 1

## 2019-04-16 MED ORDER — VASOPRESSIN 20 UNIT/ML IV SOLN
INTRAVENOUS | Status: DC | PRN
  Administered 2019-04-16 (×2): 1 [IU] via INTRAVENOUS

## 2019-04-16 MED ORDER — ROCURONIUM BROMIDE 100 MG/10ML IV SOLN
INTRAVENOUS | Status: DC | PRN
  Administered 2019-04-16: 30 mg via INTRAVENOUS

## 2019-04-16 MED ORDER — MIDAZOLAM HCL 2 MG/2ML IJ SOLN
INTRAMUSCULAR | Status: DC | PRN
  Administered 2019-04-16: 2 mg via INTRAVENOUS

## 2019-04-16 MED ORDER — FENTANYL CITRATE (PF) 100 MCG/2ML IJ SOLN
INTRAMUSCULAR | Status: DC | PRN
  Administered 2019-04-16 (×4): 25 ug via INTRAVENOUS

## 2019-04-16 MED ORDER — SUCCINYLCHOLINE CHLORIDE 20 MG/ML IJ SOLN
INTRAMUSCULAR | Status: DC | PRN
  Administered 2019-04-16: 100 mg via INTRAVENOUS

## 2019-04-16 MED ORDER — EVICEL 5 ML EX KIT
PACK | CUTANEOUS | Status: AC
  Filled 2019-04-16: qty 1

## 2019-04-16 MED ORDER — PHENYLEPHRINE HCL (PRESSORS) 10 MG/ML IV SOLN
INTRAVENOUS | Status: DC | PRN
  Administered 2019-04-16 (×11): 100 ug via INTRAVENOUS

## 2019-04-16 MED ORDER — ONDANSETRON HCL 4 MG/2ML IJ SOLN
INTRAMUSCULAR | Status: DC | PRN
  Administered 2019-04-16: 4 mg via INTRAVENOUS

## 2019-04-16 MED ORDER — HYDROCODONE-ACETAMINOPHEN 5-325 MG PO TABS: 1.0000 | ORAL_TABLET | Freq: Four times a day (QID) | ORAL | 0 refills | Status: DC | PRN

## 2019-04-16 MED ORDER — FENTANYL CITRATE (PF) 100 MCG/2ML IJ SOLN
INTRAMUSCULAR | Status: AC
  Filled 2019-04-16: qty 2

## 2019-04-16 MED ORDER — ONDANSETRON HCL 4 MG/2ML IJ SOLN
INTRAMUSCULAR | Status: AC
  Filled 2019-04-16: qty 2

## 2019-04-16 MED ORDER — PROPOFOL 10 MG/ML IV BOLUS
INTRAVENOUS | Status: AC
  Filled 2019-04-16: qty 20

## 2019-04-16 MED ORDER — METOPROLOL TARTRATE 5 MG/5ML IV SOLN
INTRAVENOUS | Status: DC | PRN
  Administered 2019-04-16 (×5): 1 mg via INTRAVENOUS

## 2019-04-16 MED ORDER — HEPARIN SODIUM (PORCINE) 1000 UNIT/ML IJ SOLN
INTRAMUSCULAR | Status: DC | PRN
  Administered 2019-04-16: 3000 [IU] via INTRAVENOUS
  Administered 2019-04-16: 2000 [IU] via INTRAVENOUS

## 2019-04-16 MED ORDER — CEFAZOLIN SODIUM-DEXTROSE 1-4 GM/50ML-% IV SOLN
1.0000 g | INTRAVENOUS | Status: AC
  Administered 2019-04-16: 1 g via INTRAVENOUS

## 2019-04-16 MED ORDER — MIDAZOLAM HCL 2 MG/2ML IJ SOLN
INTRAMUSCULAR | Status: AC
  Filled 2019-04-16: qty 2

## 2019-04-16 MED ORDER — SODIUM CHLORIDE 0.9 % IV SOLN
INTRAVENOUS | Status: DC
  Administered 2019-04-16 (×3): via INTRAVENOUS

## 2019-04-16 MED ORDER — EVICEL 5 ML EX KIT
PACK | CUTANEOUS | Status: DC | PRN
  Administered 2019-04-16: 5 mL

## 2019-04-16 MED ORDER — PROPOFOL 10 MG/ML IV BOLUS
INTRAVENOUS | Status: DC | PRN
  Administered 2019-04-16: 150 mg via INTRAVENOUS

## 2019-04-16 MED ORDER — FAMOTIDINE 20 MG PO TABS
20.0000 mg | ORAL_TABLET | Freq: Once | ORAL | Status: AC
  Administered 2019-04-16: 06:00:00 20 mg via ORAL

## 2019-04-16 MED ORDER — HEPARIN SODIUM (PORCINE) 5000 UNIT/ML IJ SOLN
INTRAMUSCULAR | Status: AC
  Filled 2019-04-16: qty 1

## 2019-04-16 MED ORDER — FENTANYL CITRATE (PF) 100 MCG/2ML IJ SOLN
25.0000 ug | INTRAMUSCULAR | Status: AC | PRN
  Administered 2019-04-16 (×6): 25 ug via INTRAVENOUS

## 2019-04-16 MED ORDER — ETOMIDATE 2 MG/ML IV SOLN
INTRAVENOUS | Status: AC
  Filled 2019-04-16: qty 10

## 2019-04-16 MED ORDER — FENTANYL CITRATE (PF) 100 MCG/2ML IJ SOLN
INTRAMUSCULAR | Status: AC
  Administered 2019-04-16: 11:00:00 25 ug via INTRAVENOUS
  Filled 2019-04-16: qty 2

## 2019-04-16 MED ORDER — ONDANSETRON HCL 4 MG/2ML IJ SOLN
4.0000 mg | Freq: Once | INTRAMUSCULAR | Status: AC | PRN
  Administered 2019-04-16: 4 mg via INTRAVENOUS

## 2019-04-16 MED ORDER — LIDOCAINE HCL (CARDIAC) PF 100 MG/5ML IV SOSY
PREFILLED_SYRINGE | INTRAVENOUS | Status: DC | PRN
  Administered 2019-04-16: 100 mg via INTRAVENOUS

## 2019-04-16 SURGICAL SUPPLY — 55 items
ADH SKN CLS APL DERMABOND .7 (GAUZE/BANDAGES/DRESSINGS) ×1
APL PRP STRL LF DISP 70% ISPRP (MISCELLANEOUS) ×1
BAG DECANTER FOR FLEXI CONT (MISCELLANEOUS) ×2 IMPLANT
BLADE SURG SZ11 CARB STEEL (BLADE) ×2 IMPLANT
BOOT SUTURE AID YELLOW STND (SUTURE) ×2 IMPLANT
BRUSH SCRUB EZ  4% CHG (MISCELLANEOUS) ×1
BRUSH SCRUB EZ 4% CHG (MISCELLANEOUS) ×1 IMPLANT
CANISTER SUCT 1200ML W/VALVE (MISCELLANEOUS) ×2 IMPLANT
CHLORAPREP W/TINT 26 (MISCELLANEOUS) ×2 IMPLANT
CLIP SPRNG 6 S-JAW DBL (CLIP) ×1 IMPLANT
CLIP SPRNG 6MM S-JAW DBL (CLIP) ×2
COVER WAND RF STERILE (DRAPES) ×2 IMPLANT
DERMABOND ADVANCED (GAUZE/BANDAGES/DRESSINGS) ×1
DERMABOND ADVANCED .7 DNX12 (GAUZE/BANDAGES/DRESSINGS) ×1 IMPLANT
ELECT CAUTERY BLADE 6.4 (BLADE) ×2 IMPLANT
ELECT REM PT RETURN 9FT ADLT (ELECTROSURGICAL) ×2
ELECTRODE REM PT RTRN 9FT ADLT (ELECTROSURGICAL) ×1 IMPLANT
GEL ULTRASOUND 20GR AQUASONIC (MISCELLANEOUS) IMPLANT
GLOVE BIO SURGEON STRL SZ7 (GLOVE) ×4 IMPLANT
GLOVE INDICATOR 7.5 STRL GRN (GLOVE) ×2 IMPLANT
GOWN STRL REUS W/ TWL LRG LVL3 (GOWN DISPOSABLE) ×2 IMPLANT
GOWN STRL REUS W/ TWL XL LVL3 (GOWN DISPOSABLE) ×1 IMPLANT
GOWN STRL REUS W/TWL LRG LVL3 (GOWN DISPOSABLE) ×4
GOWN STRL REUS W/TWL XL LVL3 (GOWN DISPOSABLE) ×2
HEMOSTAT SURGICEL 2X3 (HEMOSTASIS) ×2 IMPLANT
IV NS 500ML (IV SOLUTION) ×2
IV NS 500ML BAXH (IV SOLUTION) ×1 IMPLANT
KIT TURNOVER KIT A (KITS) ×2 IMPLANT
LABEL OR SOLS (LABEL) ×2 IMPLANT
LOOP RED MAXI  1X406MM (MISCELLANEOUS) ×1
LOOP VESSEL MAXI 1X406 RED (MISCELLANEOUS) ×1 IMPLANT
LOOP VESSEL MINI 0.8X406 BLUE (MISCELLANEOUS) ×1 IMPLANT
LOOPS BLUE MINI 0.8X406MM (MISCELLANEOUS) ×1
NDL FILTER BLUNT 18X1 1/2 (NEEDLE) ×1 IMPLANT
NEEDLE FILTER BLUNT 18X 1/2SAF (NEEDLE) ×1
NEEDLE FILTER BLUNT 18X1 1/2 (NEEDLE) ×1 IMPLANT
NS IRRIG 500ML POUR BTL (IV SOLUTION) ×2 IMPLANT
PACK EXTREMITY ARMC (MISCELLANEOUS) ×2 IMPLANT
PAD PREP 24X41 OB/GYN DISP (PERSONAL CARE ITEMS) ×1 IMPLANT
SOLUTION CELL SAVER (CLIP) ×1 IMPLANT
STOCKINETTE 48X4 2 PLY STRL (GAUZE/BANDAGES/DRESSINGS) ×1 IMPLANT
STOCKINETTE STRL 4IN 9604848 (GAUZE/BANDAGES/DRESSINGS) ×2 IMPLANT
SUT MNCRL AB 4-0 PS2 18 (SUTURE) ×2 IMPLANT
SUT PROLENE 6 0 BV (SUTURE) ×11 IMPLANT
SUT SILK 2 0 (SUTURE) ×2
SUT SILK 2-0 18XBRD TIE 12 (SUTURE) ×1 IMPLANT
SUT SILK 3 0 (SUTURE) ×2
SUT SILK 3-0 18XBRD TIE 12 (SUTURE) ×1 IMPLANT
SUT SILK 4 0 (SUTURE) ×2
SUT SILK 4-0 18XBRD TIE 12 (SUTURE) ×1 IMPLANT
SUT VIC AB 3-0 SH 27 (SUTURE) ×2
SUT VIC AB 3-0 SH 27X BRD (SUTURE) ×1 IMPLANT
SYR 20ML LL LF (SYRINGE) ×2 IMPLANT
SYR 3ML LL SCALE MARK (SYRINGE) ×2 IMPLANT
SYR TB 1ML 27GX1/2 LL (SYRINGE) IMPLANT

## 2019-04-16 NOTE — Anesthesia Procedure Notes (Signed)
Procedure Name: Intubation Date/Time: 04/16/2019 8:03 AM Performed by: Timoteo Expose, CRNA Pre-anesthesia Checklist: Patient identified, Emergency Drugs available, Suction available and Patient being monitored Patient Re-evaluated:Patient Re-evaluated prior to induction Oxygen Delivery Method: Circle system utilized Preoxygenation: Pre-oxygenation with 100% oxygen Induction Type: IV induction Ventilation: Mask ventilation without difficulty Laryngoscope Size: McGraph and 3 Grade View: Grade II Tube type: Oral Tube size: 7.0 mm Number of attempts: 1 Airway Equipment and Method: Stylet and Oral airway Placement Confirmation: ETT inserted through vocal cords under direct vision,  positive ETCO2 and breath sounds checked- equal and bilateral Secured at: 21 cm Tube secured with: Tape Dental Injury: Teeth and Oropharynx as per pre-operative assessment

## 2019-04-16 NOTE — Anesthesia Preprocedure Evaluation (Addendum)
Anesthesia Evaluation  Patient identified by MRN, date of birth, ID band Patient awake    Reviewed: Allergy & Precautions, H&P , NPO status , Patient's Chart, lab work & pertinent test results  History of Anesthesia Complications Negative for: history of anesthetic complications  Airway Mallampati: III  TM Distance: <3 FB Neck ROM: full    Dental  (+) Chipped   Pulmonary neg pulmonary ROS, neg shortness of breath,           Cardiovascular Exercise Tolerance: Good hypertension, (-) angina(-) Past MI and (-) DOE + dysrhythmias      Neuro/Psych  Headaches, PSYCHIATRIC DISORDERS    GI/Hepatic Neg liver ROS, GERD  Medicated and Controlled,  Endo/Other  negative endocrine ROS  Renal/GU DialysisRenal diseasenegative Renal ROS  negative genitourinary   Musculoskeletal   Abdominal   Peds  Hematology negative hematology ROS (+)   Anesthesia Other Findings Past Medical History: No date: Anxiety No date: Bipolar disorder ([place]) No date: Chronic kidney disease No date: Depression No date: Dysrhythmia     Comment:  tachycardia No date: GERD (gastroesophageal reflux disease) No date: Hypertension No date: Obsessive-compulsive disorder No date: PTSD (post-traumatic stress disorder) No date: Tachycardia, unspecified No date: Vertigo  Past Surgical History: 08/22/2018: DIALYSIS/PERMA CATHETER INSERTION; N/A     Comment:  Procedure: DIALYSIS temporary catheter INSERTION;                Surgeon: Algernon Huxley, MD;  Location: [facility];  Service: Cardiovascular;  Laterality: N/A; 08/26/2018: DIALYSIS/PERMA CATHETER INSERTION; N/A     Comment:  Procedure: DIALYSIS/PERMA CATHETER INSERTION;  Surgeon:               Algernon Huxley, MD;  Location: [facility];                Service: Cardiovascular;  Laterality: N/A; 01/01/2019: DIALYSIS/PERMA CATHETER INSERTION; N/A     Comment:  Procedure:  DIALYSIS/PERMA CATHETER INSERTION;  Surgeon:               Algernon Huxley, MD;  Location: [facility];                Service: Cardiovascular;  Laterality: N/A; No date: none  BMI    Body Mass Index: 37.49 kg/m      Reproductive/Obstetrics negative OB ROS                             Anesthesia Physical  Anesthesia Plan  ASA: IV  Anesthesia Plan: General   Post-op Pain Management:    Induction: Intravenous  PONV Risk Score and Plan:   Airway Management Planned: Oral ETT  Additional Equipment:   Intra-op Plan:   Post-operative Plan: Extubation in OR  Informed Consent: I have reviewed the patients History and Physical, chart, labs and discussed the procedure including the risks, benefits and alternatives for the proposed anesthesia with the patient or authorized representative who has indicated his/her understanding and acceptance.     Dental Advisory Given  Plan Discussed with: Anesthesiologist, CRNA and Surgeon  Anesthesia Plan Comments: (Patient consented for risks of anesthesia including but not limited to:  - adverse reactions to medications - risk of intubation if required - damage to teeth, lips or other oral mucosa - sore throat or hoarseness - Damage to heart, brain, lungs  or loss of life  Patient voiced understanding.)       Anesthesia Quick Evaluation

## 2019-04-16 NOTE — Discharge Instructions (Signed)

## 2019-04-16 NOTE — Transfer of Care (Signed)
Immediate Anesthesia Transfer of Care Note  Patient: Jennifer Newman  Procedure(s) Performed: ARTERIOVENOUS (AV) FISTULA CREATION ( BRACHIAL CEPHALIC ) (Left Arm Upper)  Patient Location: PACU  Anesthesia Type:General  Level of Consciousness: awake  Airway & Oxygen Therapy: Patient Spontanous Breathing  Post-op Assessment: Report given to RN  Post vital signs: Reviewed  Last Vitals:  Vitals Value Taken Time  BP 105/76 04/16/19 0955  Temp 36.6 C 04/16/19 0955  Pulse 121 04/16/19 0958  Resp 22 04/16/19 0958  SpO2 100 % 04/16/19 0958  Vitals shown include unvalidated device data.  Last Pain:  Vitals:   04/16/19 0640  TempSrc: Tympanic  PainSc: 0-No pain      Patients Stated Pain Goal: 0 (58/34/62 1945)  Complications: No apparent anesthesia complications

## 2019-04-16 NOTE — H&P (Signed)
 [facility] History & Physical Update  The patient was interviewed and re-examined.  The patient's previous History and Physical has been reviewed and is unchanged.  There is no change in the plan of care. We plan to proceed with the scheduled procedure.  Leotis Pain, MD  04/16/2019, 6:50 AM

## 2019-04-16 NOTE — Op Note (Signed)
[facility]   OPERATIVE NOTE   PROCEDURE: Left brachiocephalic arteriovenous fistula placement Ligation of initial fistula with new fistula creation with lateral left cephalic vein branch to the left brachial artery  PRE-OPERATIVE DIAGNOSIS: 1.  ESRD      2.  Failed left radiocephalic AV fistula  POST-OPERATIVE DIAGNOSIS: 1. ESRD     2.  Failed left radiocephalic AV fistula  SURGEON: Leotis Pain, MD  ASSISTANT(S): None  ANESTHESIA: general  ESTIMATED BLOOD LOSS: 25 cc  FINDING(S): Initial AV fistula using the median antecubital branch of the cephalic vein with poor flow and sclerosis of the vein requiring revision.  Lateral cephalic vein branch was patent and able to be used for new fistula creation.  SPECIMEN(S):  none  INDICATIONS:   Jennifer Newman is a 53 y.o. female who presents with renal failure in need of pemanent dialysis acces.  The patient is scheduled for left arm AVF placement.  The patient is aware the risks include but are not limited to: bleeding, infection, steal syndrome, nerve damage, ischemic monomelic neuropathy, failure to mature, and need for additional procedures.  The patient is aware of the risks of the procedure and elects to proceed forward.  DESCRIPTION: After full informed written consent was obtained from the patient, the patient was brought back to the operating room and placed supine upon the operating table.  Prior to induction, the patient received IV antibiotics.   After obtaining adequate anesthesia, the patient was then prepped and draped in the standard fashion for a left arm access procedure.  I made a curvilinear incision at the level of the antecubital fossa and dissected through the subcutaneous tissue and fascia to gain exposure of the brachial artery.  This was noted to be patent and adequate in size for fistula creation.  This was dissected out proximally and distally and prepared for control with vessel loops .  I  then dissected out the cephalic vein.  This was noted to be somewhat sclerotic at the antecubital fossa and what was likely the median antecubital vein but adequate in size for fistula creation.  I then gave the patient 3000 units of intravenous heparin.  The vein was marked for orientation and the distal segment of the vein was ligated with a  2-0 silk, and the vein was transected.  I then interrogated this vein with serial dilators going from 2 to 4 mm.  There was some resistance with interrogation but I was able to pass up to a 4 mm dilator.  I then instilled the heparinized saline into the vein and clamped it.  At this point, I reset my exposure of the brachial artery and pulled up control on the vessel loops.  I made an arteriotomy with a #11 blade, and then I extended the arteriotomy with a Potts scissor.  I injected heparinized saline proximal and distal to this arteriotomy.  The vein was then sewn to the artery in an end-to-side configuration with a running stitch of 6-0 Prolene.  Prior to completing this anastomosis, I allowed the vein and artery to backbleed.  There was no evidence of clot from any vessels.  I completed the anastomosis in the usual fashion and then released all vessel loops and clamps.  On release control, there was only palpable flow over the first 2 to 3 cm in the vein and then the vein more proximal than this appeared to be sclerotic with poor flow.  In addition, there was a hematoma  developing a few centimeters above this in the upper arm.  I made an incision in the upper arm dissected down to the fistula and clearly the cephalic vein at this location was sclerotic with no flow more proximally.  There was a relatively large lateral branch starting approximately 10 to 12 cm from the original anastomosis and from there the vein was patent and soft.  The area of bleeding from the original vein was controlled initially with Prolene sutures to close small holes but the vein was sclerotic  and was clearly not going to support and mature into a fistula.  I then ligated this vein and dissected out the lateral branch of the cephalic vein extending my incision laterally to the antecubital fossa.  I was able dissect this down to an appropriate length to swing this down to the brachial artery to create a new anastomosis.  It was marked for orientation and ligated distally with a 2-0 silk tie.  Heparinized saline was then instilled the patient was given an additional 2000 units of heparin.  I reset my control on the artery and removed the initial anastomosis with an 11 blade and Potts scissors taking care to not harm the artery.  The the original fistula vein was ligated with a 2-0 silk tie up near the junction to the patent branch we were now going to use and down closer to the original stenosis.  I then swung the cephalic vein branch down to the brachial artery and performed an end-to-side anastomosis with the brachial artery to this vein with a running 6-0 Prolene suture.  The vessel was flushed and de-aired prior to release control and on release control there was an excellent thrill within the vein and no sign of bleeding.  There was a palpable  thrill in the venous outflow, and there was a palpable pulse in the artery distal to the anastomosis.  At this point, I irrigated out the surgical wound.  Surgicel and Evicel was placed. There was no further active bleeding.  The subcutaneous tissue was reapproximated with a running stitch of 3-0 Vicryl.  The skin was then closed with a 4-0 Monocryl suture.  The skin was then cleaned, dried, and reinforced with Dermabond.  The patient tolerated this procedure well and was taken to the recovery room in stable condition  COMPLICATIONS: None  CONDITION: Stable   Leotis Pain    04/16/2019, 9:56 AM  This note was created with Dragon Medical transcription system. Any errors in dictation are purely unintentional.

## 2019-04-16 NOTE — Anesthesia Post-op Follow-up Note (Signed)
Anesthesia QCDR form completed.        

## 2019-04-16 NOTE — Progress Notes (Signed)
Upon arrival heart rate varied from 80-142  Dr Kayleen Memos aware

## 2019-04-16 NOTE — Progress Notes (Signed)
Pt has no bruit and thrill , Dr Lucky Cowboy aware

## 2019-04-18 NOTE — Anesthesia Postprocedure Evaluation (Signed)
Anesthesia Post Note  Patient: Jennifer Newman  Procedure(s) Performed: ARTERIOVENOUS (AV) FISTULA CREATION ( BRACHIAL CEPHALIC ) (Left Arm Upper)  Patient location during evaluation: PACU Anesthesia Type: General Level of consciousness: awake and alert and oriented Pain management: pain level controlled Vital Signs Assessment: post-procedure vital signs reviewed and stable Respiratory status: spontaneous breathing Cardiovascular status: blood pressure returned to baseline Anesthetic complications: no     Last Vitals:  Vitals:   04/16/19 1200 04/16/19 1215  BP: (!) 91/56 101/66  Pulse:    Resp: 20 20  Temp:    SpO2: 96% 94%    Last Pain:  Vitals:   04/17/19 0818  TempSrc:   PainSc: 0-No pain                 Kristin Barcus

## 2019-05-20 ENCOUNTER — Other Ambulatory Visit (INDEPENDENT_AMBULATORY_CARE_PROVIDER_SITE_OTHER): Payer: Self-pay | Admitting: Nurse Practitioner

## 2019-05-20 DIAGNOSIS — N186 End stage renal disease: Secondary | ICD-10-CM

## 2019-05-20 DIAGNOSIS — Z9889 Other specified postprocedural states: Secondary | ICD-10-CM

## 2019-05-21 ENCOUNTER — Ambulatory Visit (INDEPENDENT_AMBULATORY_CARE_PROVIDER_SITE_OTHER): Payer: BC Managed Care – PPO | Admitting: Nurse Practitioner

## 2019-05-21 ENCOUNTER — Encounter (INDEPENDENT_AMBULATORY_CARE_PROVIDER_SITE_OTHER): Payer: Self-pay | Admitting: Nurse Practitioner

## 2019-05-21 ENCOUNTER — Other Ambulatory Visit: Payer: Self-pay

## 2019-05-21 ENCOUNTER — Ambulatory Visit (INDEPENDENT_AMBULATORY_CARE_PROVIDER_SITE_OTHER): Payer: BC Managed Care – PPO

## 2019-05-21 VITALS — BP 132/78 | HR 80 | Resp 16 | Wt 209.0 lb

## 2019-05-21 DIAGNOSIS — N186 End stage renal disease: Secondary | ICD-10-CM

## 2019-05-21 DIAGNOSIS — Z9889 Other specified postprocedural states: Secondary | ICD-10-CM | POA: Diagnosis not present

## 2019-05-21 DIAGNOSIS — E785 Hyperlipidemia, unspecified: Secondary | ICD-10-CM

## 2019-05-21 DIAGNOSIS — I10 Essential (primary) hypertension: Secondary | ICD-10-CM

## 2019-05-21 DIAGNOSIS — Z992 Dependence on renal dialysis: Secondary | ICD-10-CM

## 2019-05-25 ENCOUNTER — Encounter (INDEPENDENT_AMBULATORY_CARE_PROVIDER_SITE_OTHER): Payer: Self-pay | Admitting: Nurse Practitioner

## 2019-05-25 NOTE — Progress Notes (Signed)
SUBJECTIVE:  Patient ID: Jennifer Newman, female    DOB: March 27, 1966, 53 y.o.   MRN: [id number] Chief Complaint  Patient presents with  . Follow-up    [facility] 5week post fistula    HPI  Jennifer Newman is a 53 y.o. female that presents today for follow-up after her left brachiocephalic AV fistula placement on 04/16/2019.  The patient previously had a radiocephalic fistula on 0/93/2355 however shortly after placement it thrombosed.  Patient currently denies any issues with her fistula since its been placed.  She denies any hand numbness or pain.  She denies any temperature or color changes.  Currently her PermCath is working well.  Today the patient has a flow volume of 1461.  The AV fistula appears to be patent throughout.  There is narrowing seen in segment just beyond the anastomosis with elevated velocities.  There are diameter seen within the distal segment and the mid to proximal segment.  The fistula has a good thrill and bruit however there is no obviously palpable fistula.  Past Medical History:  Diagnosis Date  . Anxiety   . Bipolar disorder ([place])   . Chronic kidney disease   . Depression   . Dysrhythmia    tachycardia  . GERD (gastroesophageal reflux disease)   . Hypertension   . Obsessive-compulsive disorder   . PTSD (post-traumatic stress disorder)   . Tachycardia, unspecified   . Vertigo     Past Surgical History:  Procedure Laterality Date  . AV FISTULA PLACEMENT Left 02/26/2019   Procedure: ARTERIOVENOUS (AV) FISTULA CREATION ( RADIAL CEPHALIC );  Surgeon: Algernon Huxley, MD;  Location: [facility];  Service: Vascular;  Laterality: Left;  . AV FISTULA PLACEMENT Left 04/16/2019   Procedure: ARTERIOVENOUS (AV) FISTULA CREATION ( BRACHIAL CEPHALIC );  Surgeon: Algernon Huxley, MD;  Location: [facility];  Service: Vascular;  Laterality: Left;  . DIALYSIS/PERMA CATHETER INSERTION N/A 08/22/2018   Procedure: DIALYSIS temporary catheter INSERTION;  Surgeon: Algernon Huxley, MD;   Location: [place] [facility];  Service: Cardiovascular;  Laterality: N/A;  . DIALYSIS/PERMA CATHETER INSERTION N/A 08/26/2018   Procedure: DIALYSIS/PERMA CATHETER INSERTION;  Surgeon: Algernon Huxley, MD;  Location: [place] [facility];  Service: Cardiovascular;  Laterality: N/A;  . DIALYSIS/PERMA CATHETER INSERTION N/A 01/01/2019   Procedure: DIALYSIS/PERMA CATHETER INSERTION;  Surgeon: Algernon Huxley, MD;  Location: [place] [facility];  Service: Cardiovascular;  Laterality: N/A;  . none    . PERIPHERAL VASCULAR THROMBECTOMY Left 03/12/2019   Procedure: PERIPHERAL VASCULAR THROMBECTOMY;  Surgeon: Algernon Huxley, MD;  Location: [place] [facility];  Service: Cardiovascular;  Laterality: Left;    Social History   Socioeconomic History  . Marital status: Married    Spouse name: Corene Cornea  . Number of children: 2  . Years of education: Not on file  . Highest education level: Associate degree: occupational, Hotel manager, or vocational program  Occupational History  . 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 Financial resource strain: Not hard at all  . Food insecurity    Worry: Never true    Inability: Never true  . Transportation needs    Medical: No    Non-medical: No  Tobacco Use  . Smoking status: Never Smoker  . Smokeless tobacco: Never Used  Substance and Sexual Activity  . Alcohol use: Not Currently    Alcohol/week: 0.0 standard drinks  . Drug use: Not Currently  Types: Marijuana    Comment: > 44yrs  . Sexual activity: Not Currently  Lifestyle  . Physical activity    Days per week: 0 days    Minutes per session: 0 min  . Stress: Rather much  Relationships  . Social Herbalist on phone: Never    Gets together: Never    Attends religious service: Never    Active member of club or organization: No    Attends meetings of clubs or organizations: Never    Relationship status: Married  . Intimate partner violence     Fear of current or ex partner: No    Emotionally abused: No    Physically abused: No    Forced sexual activity: No  Other Topics Concern  . Not on file  Social History Narrative  . Not on file    Family History  Problem Relation Age of Onset  . COPD Neg Hx   . Diabetes Neg Hx   . Hypertension Neg Hx   . CAD Neg Hx     No Known Allergies   Review of Systems   Review of Systems: Negative Unless Checked Constitutional: [] Weight loss  [] Fever  [] Chills Cardiac: [] Chest pain   []  Atrial Fibrillation  [] Palpitations   [] Shortness of breath when laying flat   [] Shortness of breath with exertion. [] Shortness of breath at rest Vascular:  [] Pain in legs with walking   [] Pain in legs with standing [] Pain in legs when laying flat   [] Claudication    [] Pain in feet when laying flat    [] History of DVT   [] Phlebitis   [] Swelling in legs   [] Varicose veins   [] Non-healing ulcers Pulmonary:   [] Uses home oxygen   [] Productive cough   [] Hemoptysis   [] Wheeze  [] COPD   [] Asthma Neurologic:  [] Dizziness   [] Seizures  [] Blackouts [] History of stroke   [] History of TIA  [] Aphasia   [] Temporary Blindness   [] Weakness or numbness in arm   [] Weakness or numbness in leg Musculoskeletal:   [] Joint swelling   [] Joint pain   [] Low back pain  []  History of Knee Replacement [] Arthritis [] back Surgeries  []  Spinal Stenosis    Hematologic:  [] Easy bruising  [] Easy bleeding   [] Hypercoagulable state   [x] Anemic Gastrointestinal:  [] Diarrhea   [] Vomiting  [x] Gastroesophageal reflux/heartburn   [] Difficulty swallowing. [] Abdominal pain Genitourinary:  [x] Chronic kidney disease   [] Difficult urination  [] Anuric   [] Blood in urine [] Frequent urination  [] Burning with urination   [] Hematuria Skin:  [] Rashes   [] Ulcers [] Wounds Psychological:  [x] History of anxiety   [x]  History of major depression  []  Memory Difficulties      OBJECTIVE:   Physical Exam  BP 132/78 (BP Location: Right Arm)   Pulse 80   Resp 16    Wt 209 lb (94.8 kg)   LMP 08/31/2018   BMI 37.02 kg/m   Gen: WD/WN, NAD Head: [place]/AT, No temporalis wasting.  Ear/Nose/Throat: Hearing grossly intact, nares w/o erythema or drainage Eyes: PER, EOMI, sclera nonicteric.  Neck: Supple, no masses.  No JVD.  Pulmonary:  Good air movement, no use of accessory muscles.  Cardiac: RRR Vascular:  Good thrill and bruit, well-healed incision site.  Absence of obvious fistula Vessel Right Left  Radial Palpable Palpable   Gastrointestinal: soft, non-distended. No guarding/no peritoneal signs.  Musculoskeletal: M/S 5/5 throughout.  No deformity or atrophy.  Neurologic: Pain and light touch intact in extremities.  Symmetrical.  Speech is fluent. Motor  exam as listed above. Psychiatric: Judgment intact, Mood & affect appropriate for pt's clinical situation. Dermatologic: No Venous rashes. No Ulcers Noted.  No changes consistent with cellulitis. Lymph : No Cervical lymphadenopathy, no lichenification or skin changes of chronic lymphedema.       ASSESSMENT AND PLAN:  1. ESRD on dialysis [facility]) We will have the patient return in 3 weeks to see if the fistula has fully matured.  If not we will have to undergo a fistulogram for possible balloon assisted maturation or treatment of the stenosis to see if that will assist with fistula maturation.  2. Essential hypertension Continue antihypertensive medications as already ordered, these medications have been reviewed and there are no changes at this time.   3. Hyperlipidemia, unspecified hyperlipidemia type Continue statin as ordered and reviewed, no changes at this time    Current Outpatient Medications on File Prior to Visit  Medication Sig Dispense Refill  . amLODipine (NORVASC) 10 MG tablet Take 1 tablet by mouth 1 day or 1 dose.    . butalbital-aspirin-caffeine (FIORINAL) 50-325-40 MG capsule Take 1 capsule by mouth 2 (two) times daily as needed for headache (for migraine).    . carvedilol  (COREG) 25 MG tablet Take 1 tablet by mouth once.     . clonazePAM (KLONOPIN) 0.5 MG tablet Take 1 tablet by mouth daily as needed.     . ferric citrate (AURYXIA) 1 GM 210 MG(Fe) tablet Take 420 mg by mouth 3 (three) times daily with meals.    . fluticasone (FLONASE) 50 MCG/ACT nasal spray Place 1 spray into both nostrils 2 (two) times daily.    Marland Kitchen losartan (COZAAR) 25 MG tablet Take 25 mg by mouth daily.    . nortriptyline (PAMELOR) 10 MG capsule Take 2 capsules by mouth at bedtime.     . sertraline (ZOLOFT) 25 MG tablet Take 1 tablet by mouth 1 day or 1 dose.    . Vitamin D, Ergocalciferol, (DRISDOL) 1.25 MG (50000 UT) CAPS capsule Take 1 capsule by mouth once a week.    Marland Kitchen HYDROcodone-acetaminophen (NORCO) 5-325 MG tablet Take 1-2 tablets by mouth every 6 (six) hours as needed for moderate pain. (Patient not taking: Reported on 05/21/2019) 30 tablet 0   No current facility-administered medications on file prior to visit.     There are no Patient Instructions on file for this visit. No follow-ups on file.   Kris Hartmann, NP  This note was completed with Sales executive.  Any errors are purely unintentional.

## 2019-06-02 ENCOUNTER — Encounter: Payer: Self-pay | Admitting: Vascular Surgery

## 2019-06-11 ENCOUNTER — Ambulatory Visit (INDEPENDENT_AMBULATORY_CARE_PROVIDER_SITE_OTHER): Payer: BC Managed Care – PPO | Admitting: Nurse Practitioner

## 2019-06-11 ENCOUNTER — Encounter (INDEPENDENT_AMBULATORY_CARE_PROVIDER_SITE_OTHER): Payer: Self-pay | Admitting: Nurse Practitioner

## 2019-06-11 ENCOUNTER — Other Ambulatory Visit: Payer: Self-pay

## 2019-06-11 VITALS — BP 100/69 | HR 78 | Resp 18 | Ht 63.0 in | Wt 209.0 lb

## 2019-06-11 DIAGNOSIS — E785 Hyperlipidemia, unspecified: Secondary | ICD-10-CM

## 2019-06-11 DIAGNOSIS — N186 End stage renal disease: Secondary | ICD-10-CM

## 2019-06-11 DIAGNOSIS — I10 Essential (primary) hypertension: Secondary | ICD-10-CM

## 2019-06-11 DIAGNOSIS — Z992 Dependence on renal dialysis: Secondary | ICD-10-CM

## 2019-06-11 DIAGNOSIS — F419 Anxiety disorder, unspecified: Secondary | ICD-10-CM

## 2019-06-13 ENCOUNTER — Encounter (INDEPENDENT_AMBULATORY_CARE_PROVIDER_SITE_OTHER): Payer: Self-pay | Admitting: Nurse Practitioner

## 2019-06-13 NOTE — Progress Notes (Signed)
SUBJECTIVE:  Patient ID: Jennifer Newman, female    DOB: 1966-04-20, 53 y.o.   MRN: [id number] Chief Complaint  Patient presents with  . Follow-up    HPI  Jennifer Newman is a 53 y.o. female that returns today for evaluation of her left brachiocephalic AV fistula.  Patient's previous ultrasound showed good flow volume within the fistula.  However, the patient's fistula appears to be deep and not easily palpable.  This would pose an issue for the patient when it comes to accessing her fistula for dialysis.  She currently denies any issues with dialysis utilizing her PermCath.  She denies any fever, chills, nausea, vomiting or diarrhea.  She denies any chest pain or shortness of breath.  Denies any TIA-like symptoms.  Past Medical History:  Diagnosis Date  . Anxiety   . Bipolar disorder ([place])   . Chronic kidney disease   . Depression   . Dysrhythmia    tachycardia  . GERD (gastroesophageal reflux disease)   . Hypertension   . Obsessive-compulsive disorder   . PTSD (post-traumatic stress disorder)   . Tachycardia, unspecified   . Vertigo     Past Surgical History:  Procedure Laterality Date  . AV FISTULA PLACEMENT Left 02/26/2019   Procedure: ARTERIOVENOUS (AV) FISTULA CREATION ( RADIAL CEPHALIC );  Surgeon: Algernon Huxley, MD;  Location: [facility];  Service: Vascular;  Laterality: Left;  . AV FISTULA PLACEMENT Left 04/16/2019   Procedure: ARTERIOVENOUS (AV) FISTULA CREATION ( BRACHIAL CEPHALIC );  Surgeon: Algernon Huxley, MD;  Location: [facility];  Service: Vascular;  Laterality: Left;  . DIALYSIS/PERMA CATHETER INSERTION N/A 08/22/2018   Procedure: DIALYSIS temporary catheter INSERTION;  Surgeon: Algernon Huxley, MD;  Location: [place] [facility];  Service: Cardiovascular;  Laterality: N/A;  . DIALYSIS/PERMA CATHETER INSERTION N/A 08/26/2018   Procedure: DIALYSIS/PERMA CATHETER INSERTION;  Surgeon: Algernon Huxley, MD;  Location: [place] [facility];  Service: Cardiovascular;  Laterality:  N/A;  . DIALYSIS/PERMA CATHETER INSERTION N/A 01/01/2019   Procedure: DIALYSIS/PERMA CATHETER INSERTION;  Surgeon: Algernon Huxley, MD;  Location: [place] [facility];  Service: Cardiovascular;  Laterality: N/A;  . none    . PERIPHERAL VASCULAR THROMBECTOMY Left 03/12/2019   Procedure: PERIPHERAL VASCULAR THROMBECTOMY;  Surgeon: Algernon Huxley, MD;  Location: [place] [facility];  Service: Cardiovascular;  Laterality: Left;    Social History   Socioeconomic History  . Marital status: Married    Spouse name: Corene Cornea  . Number of children: 2  . Years of education: Not on file  . Highest education level: Associate degree: occupational, Hotel manager, or vocational program  Occupational History  . 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 Smoking status: Never Smoker  . Smokeless tobacco: Never Used  Substance and Sexual Activity  . Alcohol use: Not Currently    Alcohol/week: 0.0 standard drinks  . Drug use: Not Currently    Types: Marijuana    Comment: > 29yrs  . Sexual activity: Not Currently  Other Topics Concern  . Not on file  Social History Narrative  . Not on file   Social Determinants of Health   Financial Resource Strain: Low Risk   . Difficulty of Paying Living Expenses: Not hard at all  Food Insecurity: No Food Insecurity  . Worried About Charity fundraiser in the Last Year: Never true  . Ran Out of Food in the Last Year: Never  true  Transportation Needs: No Transportation Needs  . Lack of Transportation (Medical): No  . Lack of Transportation (Non-Medical): No  Physical Activity: Inactive  . Days of Exercise per Week: 0 days  . Minutes of Exercise per Session: 0 min  Stress: Stress Concern Present  . Feeling of Stress : Rather much  Social Connections: Moderately Isolated  . Frequency of Communication with Friends and Family: Never  . Frequency of Social Gatherings with Friends and Family: Never  . Attends  Religious Services: Never  . Active Member of Clubs or Organizations: No  . Attends Archivist Meetings: Never  . Marital Status: Married  Human resources officer Violence: Not At Risk  . Fear of Current or Ex-Partner: No  . Emotionally Abused: No  . Physically Abused: No  . Sexually Abused: No    Family History  Problem Relation Age of Onset  . COPD Neg Hx   . Diabetes Neg Hx   . Hypertension Neg Hx   . CAD Neg Hx     No Known Allergies   Review of Systems   Review of Systems: Negative Unless Checked Constitutional: [] Weight loss  [] Fever  [] Chills Cardiac: [] Chest pain   []  Atrial Fibrillation  [] Palpitations   [] Shortness of breath when laying flat   [] Shortness of breath with exertion. [] Shortness of breath at rest Vascular:  [] Pain in legs with walking   [] Pain in legs with standing [] Pain in legs when laying flat   [] Claudication    [] Pain in feet when laying flat    [] History of DVT   [] Phlebitis   [] Swelling in legs   [] Varicose veins   [] Non-healing ulcers Pulmonary:   [] Uses home oxygen   [] Productive cough   [] Hemoptysis   [] Wheeze  [] COPD   [] Asthma Neurologic:  [] Dizziness   [] Seizures  [] Blackouts [] History of stroke   [] History of TIA  [] Aphasia   [] Temporary Blindness   [] Weakness or numbness in arm   [] Weakness or numbness in leg Musculoskeletal:   [] Joint swelling   [] Joint pain   [] Low back pain  []  History of Knee Replacement [] Arthritis [] back Surgeries  []  Spinal Stenosis    Hematologic:  [] Easy bruising  [] Easy bleeding   [] Hypercoagulable state   [x] Anemic Gastrointestinal:  [] Diarrhea   [] Vomiting  [] Gastroesophageal reflux/heartburn   [] Difficulty swallowing. [] Abdominal pain Genitourinary:  [x] Chronic kidney disease   [] Difficult urination  [] Anuric   [] Blood in urine [] Frequent urination  [] Burning with urination   [] Hematuria Skin:  [] Rashes   [] Ulcers [] Wounds Psychological:  [] History of anxiety   []  History of major depression  []  Memory  Difficulties      OBJECTIVE:   Physical Exam  BP 100/69 (BP Location: Right Arm)   Pulse 78   Resp 18   Ht 5\' 3"  (1.6 m)   Wt 209 lb (94.8 kg)   LMP 08/31/2018   BMI 37.02 kg/m   Gen: WD/WN, NAD Head: [place]/AT, No temporalis wasting.  Ear/Nose/Throat: Hearing grossly intact, nares w/o erythema or drainage Eyes: PER, EOMI, sclera nonicteric.  Neck: Supple, no masses.  No JVD.  Pulmonary:  Good air movement, no use of accessory muscles.  Cardiac: RRR Vascular:  Good thrill and bruit Vessel Right Left  Radial Palpable Palpable   Gastrointestinal: soft, non-distended. No guarding/no peritoneal signs.  Musculoskeletal: M/S 5/5 throughout.  No deformity or atrophy.  Neurologic: Pain and light touch intact in extremities.  Symmetrical.  Speech is fluent. Motor exam as listed above. Psychiatric: Judgment  intact, Mood & affect appropriate for pt's clinical situation. Dermatologic: No Venous rashes. No Ulcers Noted.  No changes consistent with cellulitis. Lymph : No Cervical lymphadenopathy, no lichenification or skin changes of chronic lymphedema.       ASSESSMENT AND PLAN:  1. ESRD on dialysis [facility]) Recommend:  Currently the patient has a functional fistula however there is no palpable fistula.  At this time we will plan for a fistulogram of the left brachiocephalic AV fistula in order to determine that there are no possible areas of stenosis or large offsetting branches that are preventing the fistula from enlarging and for dialysis.  However if following the fistulogram there is no visible area of intervention, the fistula will need to be made superficial.  Patient should have a fistulagram with the intention for intervention.  The intention for intervention is to restore appropriate flow and prevent thrombosis and possible loss of the access.  As well as improve the quality of dialysis therapy.  The risks, benefits and alternative therapies were reviewed in detail with the  patient.  All questions were answered.  The patient agrees to proceed with angio/intervention.      2. Hyperlipidemia, unspecified hyperlipidemia type Continue statin as ordered and reviewed, no changes at this time   3. Essential hypertension Continue antihypertensive medications as already ordered, these medications have been reviewed and there are no changes at this time.   4. Anxiety Patient is considerably less anxious regarding dialysis and when she previously presented.   Current Outpatient Medications on File Prior to Visit  Medication Sig Dispense Refill  . butalbital-aspirin-caffeine (FIORINAL) 50-325-40 MG capsule Take 1 capsule by mouth 2 (two) times daily as needed for headache (for migraine).    . carvedilol (COREG) 25 MG tablet Take 1 tablet by mouth once.     . clonazePAM (KLONOPIN) 0.5 MG tablet Take 1 tablet by mouth daily as needed.     . ferric citrate (AURYXIA) 1 GM 210 MG(Fe) tablet Take 420 mg by mouth 3 (three) times daily with meals.    . fluticasone (FLONASE) 50 MCG/ACT nasal spray Place 1 spray into both nostrils 2 (two) times daily.    Marland Kitchen losartan (COZAAR) 25 MG tablet Take 25 mg by mouth daily.    . nortriptyline (PAMELOR) 10 MG capsule Take 2 capsules by mouth at bedtime.     . sertraline (ZOLOFT) 25 MG tablet Take 1 tablet by mouth 1 day or 1 dose.    Marland Kitchen amLODipine (NORVASC) 10 MG tablet Take 1 tablet by mouth 1 day or 1 dose.    Marland Kitchen HYDROcodone-acetaminophen (NORCO) 5-325 MG tablet Take 1-2 tablets by mouth every 6 (six) hours as needed for moderate pain. (Patient not taking: Reported on 05/21/2019) 30 tablet 0  . Vitamin D, Ergocalciferol, (DRISDOL) 1.25 MG (50000 UT) CAPS capsule Take 1 capsule by mouth once a week.     No current facility-administered medications on file prior to visit.    There are no Patient Instructions on file for this visit. No follow-ups on file.   Kris Hartmann, NP  This note was completed with Sales executive.  Any  errors are purely unintentional.

## 2019-06-20 ENCOUNTER — Telehealth (INDEPENDENT_AMBULATORY_CARE_PROVIDER_SITE_OTHER): Payer: Self-pay

## 2019-06-20 NOTE — Telephone Encounter (Signed)
Spoke with the patient and she is now scheduled with Dr. Lucky Cowboy for a left fistulagram on 06/30/2019 with a 8:30 am arrival time to the [facility]. Patient will do covid testing at the [facility] on 06/26/2019 before 12:00 pm. Pre-procedure instructions were discussed and will be mailed to the patient.

## 2019-06-23 NOTE — Telephone Encounter (Signed)
Patient called wanting to know since she will be at dialysis on 06/26/2019 until 12:00 pm could she do her covid testing on 06/25/2019. I let the patient know that would be fine.

## 2019-06-25 ENCOUNTER — Other Ambulatory Visit: Payer: Self-pay

## 2019-06-25 ENCOUNTER — Other Ambulatory Visit
Admission: RE | Admit: 2019-06-25 | Discharge: 2019-06-25 | Disposition: A | Discharge status: Home / Self Care | Payer: BC Managed Care – PPO | Source: Ambulatory Visit | Attending: Vascular Surgery | Admitting: Vascular Surgery

## 2019-06-25 DIAGNOSIS — Z01812 Encounter for preprocedural laboratory examination: Secondary | ICD-10-CM | POA: Diagnosis present

## 2019-06-25 DIAGNOSIS — Z20828 Contact with and (suspected) exposure to other viral communicable diseases: Secondary | ICD-10-CM | POA: Insufficient documentation

## 2019-06-25 LAB — SARS CORONAVIRUS 2 (TAT 6-24 HRS): SARS Coronavirus 2: NEGATIVE

## 2019-06-30 ENCOUNTER — Encounter: Payer: Self-pay | Admitting: Vascular Surgery

## 2019-06-30 ENCOUNTER — Ambulatory Visit
Admission: RE | Admit: 2019-06-30 | Discharge: 2019-06-30 | Disposition: A | Discharge status: Home / Self Care | Payer: BC Managed Care – PPO | Attending: Vascular Surgery | Admitting: Vascular Surgery

## 2019-06-30 ENCOUNTER — Other Ambulatory Visit: Payer: Self-pay

## 2019-06-30 ENCOUNTER — Encounter
Admission: RE | Disposition: A | Discharge status: Home / Self Care | Payer: Self-pay | Source: Home / Self Care | Attending: Vascular Surgery

## 2019-06-30 ENCOUNTER — Other Ambulatory Visit (INDEPENDENT_AMBULATORY_CARE_PROVIDER_SITE_OTHER): Payer: Self-pay | Admitting: Nurse Practitioner

## 2019-06-30 DIAGNOSIS — Z7982 Long term (current) use of aspirin: Secondary | ICD-10-CM | POA: Diagnosis not present

## 2019-06-30 DIAGNOSIS — N186 End stage renal disease: Secondary | ICD-10-CM | POA: Diagnosis not present

## 2019-06-30 DIAGNOSIS — F419 Anxiety disorder, unspecified: Secondary | ICD-10-CM | POA: Insufficient documentation

## 2019-06-30 DIAGNOSIS — Z992 Dependence on renal dialysis: Secondary | ICD-10-CM | POA: Diagnosis not present

## 2019-06-30 DIAGNOSIS — F319 Bipolar disorder, unspecified: Secondary | ICD-10-CM | POA: Diagnosis not present

## 2019-06-30 DIAGNOSIS — Y832 Surgical operation with anastomosis, bypass or graft as the cause of abnormal reaction of the patient, or of later complication, without mention of misadventure at the time of the procedure: Secondary | ICD-10-CM | POA: Diagnosis not present

## 2019-06-30 DIAGNOSIS — Z79899 Other long term (current) drug therapy: Secondary | ICD-10-CM | POA: Diagnosis not present

## 2019-06-30 DIAGNOSIS — I120 Hypertensive chronic kidney disease with stage 5 chronic kidney disease or end stage renal disease: Secondary | ICD-10-CM | POA: Diagnosis not present

## 2019-06-30 DIAGNOSIS — T82590A Other mechanical complication of surgically created arteriovenous fistula, initial encounter: Secondary | ICD-10-CM | POA: Insufficient documentation

## 2019-06-30 DIAGNOSIS — E785 Hyperlipidemia, unspecified: Secondary | ICD-10-CM | POA: Diagnosis not present

## 2019-06-30 DIAGNOSIS — T82858A Stenosis of vascular prosthetic devices, implants and grafts, initial encounter: Secondary | ICD-10-CM | POA: Insufficient documentation

## 2019-06-30 DIAGNOSIS — T82898A Other specified complication of vascular prosthetic devices, implants and grafts, initial encounter: Secondary | ICD-10-CM | POA: Diagnosis not present

## 2019-06-30 HISTORY — PX: A/V FISTULAGRAM: CATH118298

## 2019-06-30 LAB — POTASSIUM (ARMC VASCULAR LAB ONLY): Potassium (ARMC vascular lab): 3.7 (ref 3.5–5.1)

## 2019-06-30 SURGERY — A/V FISTULAGRAM
Anesthesia: Moderate Sedation | Laterality: Left

## 2019-06-30 MED ORDER — MIDAZOLAM HCL 5 MG/5ML IJ SOLN
INTRAMUSCULAR | Status: AC
  Filled 2019-06-30: qty 5

## 2019-06-30 MED ORDER — DIPHENHYDRAMINE HCL 50 MG/ML IJ SOLN: 50.0000 mg | Freq: Once | INTRAMUSCULAR | Status: DC | PRN

## 2019-06-30 MED ORDER — METHYLPREDNISOLONE SODIUM SUCC 125 MG IJ SOLR: 125.0000 mg | Freq: Once | INTRAMUSCULAR | Status: DC | PRN

## 2019-06-30 MED ORDER — CEFAZOLIN SODIUM-DEXTROSE 1-4 GM/50ML-% IV SOLN: 1.0000 g | Freq: Once | INTRAVENOUS | Status: AC

## 2019-06-30 MED ORDER — ONDANSETRON HCL 4 MG/2ML IJ SOLN: 4.0000 mg | Freq: Four times a day (QID) | INTRAMUSCULAR | Status: DC | PRN

## 2019-06-30 MED ORDER — SODIUM CHLORIDE 0.9 % IV SOLN
INTRAVENOUS | Status: DC
  Administered 2019-06-30: 09:00:00 1000 mL via INTRAVENOUS

## 2019-06-30 MED ORDER — FENTANYL CITRATE (PF) 100 MCG/2ML IJ SOLN
INTRAMUSCULAR | Status: DC | PRN
  Administered 2019-06-30: 50 ug via INTRAVENOUS
  Administered 2019-06-30: 25 ug via INTRAVENOUS

## 2019-06-30 MED ORDER — CEFAZOLIN SODIUM-DEXTROSE 1-4 GM/50ML-% IV SOLN
INTRAVENOUS | Status: AC
  Administered 2019-06-30: 09:00:00 1 g via INTRAVENOUS
  Filled 2019-06-30: qty 50

## 2019-06-30 MED ORDER — FAMOTIDINE 20 MG PO TABS: 40.0000 mg | ORAL_TABLET | Freq: Once | ORAL | Status: DC | PRN

## 2019-06-30 MED ORDER — HYDROMORPHONE HCL 1 MG/ML IJ SOLN: 1.0000 mg | Freq: Once | INTRAMUSCULAR | Status: DC | PRN

## 2019-06-30 MED ORDER — HEPARIN SODIUM (PORCINE) 1000 UNIT/ML IJ SOLN
INTRAMUSCULAR | Status: AC
  Filled 2019-06-30: qty 1

## 2019-06-30 MED ORDER — MIDAZOLAM HCL 2 MG/2ML IJ SOLN
INTRAMUSCULAR | Status: DC | PRN
  Administered 2019-06-30: 2 mg via INTRAVENOUS
  Administered 2019-06-30: 1 mg via INTRAVENOUS

## 2019-06-30 MED ORDER — MIDAZOLAM HCL 2 MG/ML PO SYRP: 8.0000 mg | ORAL_SOLUTION | Freq: Once | ORAL | Status: DC | PRN

## 2019-06-30 MED ORDER — IODIXANOL 320 MG/ML IV SOLN
INTRAVENOUS | Status: DC | PRN
  Administered 2019-06-30: 45 mL via INTRA_ARTERIAL

## 2019-06-30 MED ORDER — FENTANYL CITRATE (PF) 100 MCG/2ML IJ SOLN
INTRAMUSCULAR | Status: AC
  Filled 2019-06-30: qty 2

## 2019-06-30 MED ORDER — HEPARIN SODIUM (PORCINE) 1000 UNIT/ML IJ SOLN
INTRAMUSCULAR | Status: DC | PRN
  Administered 2019-06-30: 3000 [IU] via INTRAVENOUS

## 2019-06-30 SURGICAL SUPPLY — 14 items
BALLN LUTONIX DCB 6X60X130 (BALLOONS) ×2
BALLN LUTONIX DCB 7X60X130 (BALLOONS) ×2
BALLOON LUTONIX DCB 6X60X130 (BALLOONS) IMPLANT
BALLOON LUTONIX DCB 7X60X130 (BALLOONS) IMPLANT
CANNULA 5F STIFF (CANNULA) ×1 IMPLANT
CATH BEACON 5 .035 40 KMP TP (CATHETERS) IMPLANT
CATH BEACON 5 .038 40 KMP TP (CATHETERS) ×1
COVER PROBE U/S 5X48 (MISCELLANEOUS) ×1 IMPLANT
DEVICE PRESTO INFLATION (MISCELLANEOUS) ×1 IMPLANT
DRAPE BRACHIAL (DRAPES) ×1 IMPLANT
PACK ANGIOGRAPHY (CUSTOM PROCEDURE TRAY) ×2 IMPLANT
SHEATH BRITE TIP 6FRX5.5 (SHEATH) ×1 IMPLANT
SUT MNCRL AB 4-0 PS2 18 (SUTURE) ×1 IMPLANT
WIRE MAGIC TOR.035 180C (WIRE) ×1 IMPLANT

## 2019-06-30 NOTE — Op Note (Signed)
[facility]    OPERATIVE NOTE   PROCEDURE: 1.   Left brachiocephalic arteriovenous fistula cannulation under ultrasound guidance in both an antegrade and a retrograde fashion 2.   Left arm fistulagram including central venogram 3.   Percutaneous transluminal angioplasty of the mid upper arm cephalic vein with 7 mm diameter by 6 cm length Lutonix drug-coated angioplasty balloon 4.   Percutaneous transluminal angioplasty of the perianastomotic cephalic vein with a 6 mm diameter by 6 cm length Lutonix drug-coated angioplasty balloon  PRE-OPERATIVE DIAGNOSIS: 1. ESRD 2. Poorly functional left brachiocephalic AVF  POST-OPERATIVE DIAGNOSIS: same as above   SURGEON: Leotis Pain, MD  ANESTHESIA: local with MCS  ESTIMATED BLOOD LOSS: 5 cc  FINDING(S): 1. 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  The remainder of the more central portion of the vein and the central venous circulation was widely patent without stenosis.  SPECIMEN(S):  None  CONTRAST: 45 cc  FLUORO TIME: 2.5 minutes  MODERATE CONSCIOUS SEDATION TIME: Approximately 30 minutes with 3 mg of Versed and 75 mcg of Fentanyl   INDICATIONS: Jennifer Newman is a 53 y.o. female who presents with malfunctioning left brachiocephalic arteriovenous fistula.  The patient is scheduled for left arm fistulagram.  The patient is aware the risks include but are not limited to: bleeding, infection, thrombosis of the cannulated access, and possible anaphylactic reaction to the contrast.  The patient is aware of the risks of the procedure and elects to proceed forward.  DESCRIPTION: After full informed written consent was obtained, the patient was brought back to the angiography suite and placed supine upon the angiography table.  The patient was connected to monitoring equipment. Moderate conscious sedation was administered with a face to face encounter with the patient throughout  the procedure with my supervision of the RN administering medicines and monitoring the patient's vital signs and mental status throughout from the start of the procedure until the patient was taken to the recovery room. The left arm was prepped and draped in the standard fashion for a percutaneous access intervention.  Under ultrasound guidance, the left brachiocephalic arteriovenous fistula was cannulated with a micropuncture needle under direct ultrasound guidance where it was patent and a permanent image was performed.  The microwire was advanced into the fistula and the needle was exchanged for the a microsheath.  I then upsized to a 6 Fr Sheath and imaging was performed.  Hand injections were completed to image the access including the central venous system. This demonstrated 70% stenosis in the mid upper arm cephalic vein likely from a vein valve stenosis.  The remainder of the more central portion of the vein and the central venous circulation was widely patent without stenosis.  Based on the images, this patient will need intervention to this mid upper arm cephalic vein stenosis. I then gave the patient 3000 units of intravenous heparin.  I then crossed the stenosis with a Magic Tourqe wire.  Based on the imaging, a 7 mm x 6 cm  Lutonix drug coated angioplasty balloon was selected.  The balloon was centered around the mid upper arm cephalic stenosis and inflated to 12 ATM for 1 minute(s).  With the balloon inflated, imaging was performed which helped opacify the perianastomotic cephalic vein, the anastomosis, and the arterial circuit.  Behind the access sheath there was a high-grade stenosis in the 85 to 90% range in the cephalic vein in the perianastomotic segment.  This would need to be addressed through a separate access in a retrograde fashion.  The balloon was deflated and on completion imaging, a 20-25% residual stenosis was present in the mid upper arm cephalic vein. The antegrade sheath was then  removed with a 4-0 Monocryl pursestring suture placed around the sheath and pressure was held.  I then evaluated the fistula in the more proximal upper arm and accessed the fistula in a retrograde fashion with a micropuncture needle in this location.  This was done under direct ultrasound guidance and a permanent image was recorded.  A micropuncture wire and sheath were then placed and then we upsized to a 6 [place] sheath.  Using the Magic torque wire, I was able to easily cross the perianastomotic cephalic vein stenosis and get the wire into the radial artery.  A 6 mm diameter by 6 cm length Lutonix drug-coated angioplasty balloon was then inflated to 6 atm for 1 minute and the perianastomotic cephalic vein.  The balloon was then deflated and removed and a Kumpe catheter was placed at the anastomosis so imaging could evaluate this perianastomotic cephalic vein.  After angioplasty there was only about a 20% residual stenosis present.  Based on the completion imaging, no further intervention is necessary.  The wire and balloon were removed from the sheath.  A 4-0 Monocryl purse-string suture was sewn around the sheath.  The sheath was removed while tying down the suture.  A sterile bandage was applied to the puncture site.  COMPLICATIONS: None  CONDITION: Stable   Leotis Pain  06/30/2019 10:08 AM   This note was created with Dragon Medical transcription system. Any errors in dictation are purely unintentional.

## 2019-06-30 NOTE — H&P (Signed)
[facility] History & Physical Update  The patient was interviewed and re-examined.  The patient's previous History and Physical has been reviewed and is unchanged.  There is no change in the plan of care. We plan to proceed with the scheduled procedure.  Leotis Pain, MD  06/30/2019, 9:12 AM

## 2019-07-25 ENCOUNTER — Other Ambulatory Visit (INDEPENDENT_AMBULATORY_CARE_PROVIDER_SITE_OTHER): Payer: Self-pay | Admitting: Vascular Surgery

## 2019-07-25 DIAGNOSIS — Z9862 Peripheral vascular angioplasty status: Secondary | ICD-10-CM

## 2019-07-25 DIAGNOSIS — N186 End stage renal disease: Secondary | ICD-10-CM

## 2019-07-28 ENCOUNTER — Other Ambulatory Visit: Payer: Self-pay

## 2019-07-28 ENCOUNTER — Encounter (INDEPENDENT_AMBULATORY_CARE_PROVIDER_SITE_OTHER): Payer: Self-pay | Admitting: Nurse Practitioner

## 2019-07-28 ENCOUNTER — Ambulatory Visit (INDEPENDENT_AMBULATORY_CARE_PROVIDER_SITE_OTHER): Payer: BC Managed Care – PPO

## 2019-07-28 ENCOUNTER — Ambulatory Visit (INDEPENDENT_AMBULATORY_CARE_PROVIDER_SITE_OTHER): Payer: BC Managed Care – PPO | Admitting: Nurse Practitioner

## 2019-07-28 VITALS — BP 134/75 | HR 121 | Resp 12 | Ht 63.0 in | Wt 205.0 lb

## 2019-07-28 DIAGNOSIS — Z992 Dependence on renal dialysis: Secondary | ICD-10-CM

## 2019-07-28 DIAGNOSIS — Z9862 Peripheral vascular angioplasty status: Secondary | ICD-10-CM | POA: Diagnosis not present

## 2019-07-28 DIAGNOSIS — N186 End stage renal disease: Secondary | ICD-10-CM | POA: Diagnosis not present

## 2019-07-28 DIAGNOSIS — I10 Essential (primary) hypertension: Secondary | ICD-10-CM | POA: Diagnosis not present

## 2019-07-30 ENCOUNTER — Encounter (INDEPENDENT_AMBULATORY_CARE_PROVIDER_SITE_OTHER): Payer: Self-pay | Admitting: Nurse Practitioner

## 2019-07-30 NOTE — Progress Notes (Signed)
SUBJECTIVE:  Patient ID: Jennifer Newman, female    DOB: January 30, 1966, 54 y.o.   MRN: [id number] Chief Complaint  Patient presents with  . Follow-up    U/S Follow up    HPI  Jennifer Newman is a 54 y.o. female that presents today after previous intervention on her left brachiocephalic AV fistula.  The fistula was originally created on 04/16/2019 however the fistula failed to properly mature.  On 06/30/2019 the patient underwent a fistulogram which included angioplasty of the mid upper arm cephalic vein and the perianastomotic cephalic vein.  Today the patient states that she feels well and that she is continue to dialyze via her PermCath without issue.  She denies fever, chills nausea or vomiting following the procedure.  She tolerated the procedure well.  The patient's fistula is more palpable than it was previously however it does take some effort to find and palpate.  Today noninvasive studies show a flow volume of 2625 which is greatly increased from the previous flow volume of 1461 on 05/21/2019.  However there are significant velocity increases at the AV fistula anastomosis as well as the distal upper arm which shows a slight change in diameter that appears to be a little bit smaller than before.  Past Medical History:  Diagnosis Date  . Anxiety   . Bipolar disorder ([place])   . Chronic kidney disease   . Depression   . Dysrhythmia    tachycardia  . GERD (gastroesophageal reflux disease)   . Hypertension   . Obsessive-compulsive disorder   . PTSD (post-traumatic stress disorder)   . Tachycardia, unspecified   . Vertigo     Past Surgical History:  Procedure Laterality Date  . A/V FISTULAGRAM Left 06/30/2019   Procedure: A/V FISTULAGRAM;  Surgeon: Algernon Huxley, MD;  Location: [facility] [place] [facility];  Service: Cardiovascular;  Laterality: Left;  . AV FISTULA PLACEMENT Left 02/26/2019   Procedure: ARTERIOVENOUS (AV) FISTULA CREATION ( RADIAL CEPHALIC );  Surgeon: Algernon Huxley, MD;   Location: [facility];  Service: Vascular;  Laterality: Left;  . AV FISTULA PLACEMENT Left 04/16/2019   Procedure: ARTERIOVENOUS (AV) FISTULA CREATION ( BRACHIAL CEPHALIC );  Surgeon: Algernon Huxley, MD;  Location: [facility];  Service: Vascular;  Laterality: Left;  . DIALYSIS/PERMA CATHETER INSERTION N/A 08/22/2018   Procedure: DIALYSIS temporary catheter INSERTION;  Surgeon: Algernon Huxley, MD;  Location: [place] [facility];  Service: Cardiovascular;  Laterality: N/A;  . DIALYSIS/PERMA CATHETER INSERTION N/A 08/26/2018   Procedure: DIALYSIS/PERMA CATHETER INSERTION;  Surgeon: Algernon Huxley, MD;  Location: [place] [facility];  Service: Cardiovascular;  Laterality: N/A;  . DIALYSIS/PERMA CATHETER INSERTION N/A 01/01/2019   Procedure: DIALYSIS/PERMA CATHETER INSERTION;  Surgeon: Algernon Huxley, MD;  Location: [place] [facility];  Service: Cardiovascular;  Laterality: N/A;  . none    . PERIPHERAL VASCULAR THROMBECTOMY Left 03/12/2019   Procedure: PERIPHERAL VASCULAR THROMBECTOMY;  Surgeon: Algernon Huxley, MD;  Location: [place] [facility];  Service: Cardiovascular;  Laterality: Left;    Social History   Socioeconomic History  . Marital status: Married    Spouse name: Corene Cornea  . Number of children: 2  . Years of education: Not on file  . Highest education level: Associate degree: occupational, Hotel manager, or vocational program  Occupational History  . 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 Smoking status: Never Smoker  . Smokeless tobacco: Never Used  Substance and Sexual Activity  . Alcohol use: Not Currently    Alcohol/week: 0.0 standard drinks  . Drug use: Not Currently    Types: Marijuana    Comment: > 50yrs  . Sexual activity: Not Currently  Other Topics Concern  . Not on file  Social History Narrative  . Not on file   Social Determinants of Health   Financial Resource Strain:   . Difficulty of Paying Living Expenses: Not on file   Food Insecurity:   . Worried About Charity fundraiser in the Last Year: Not on file  . Ran Out of Food in the Last Year: Not on file  Transportation Needs:   . Lack of Transportation (Medical): Not on file  . Lack of Transportation (Non-Medical): Not on file  Physical Activity:   . Days of Exercise per Week: Not on file  . Minutes of Exercise per Session: Not on file  Stress:   . Feeling of Stress : Not on file  Social Connections:   . Frequency of Communication with Friends and Family: Not on file  . Frequency of Social Gatherings with Friends and Family: Not on file  . Attends Religious Services: Not on file  . Active Member of Clubs or Organizations: Not on file  . Attends Archivist Meetings: Not on file  . Marital Status: Not on file  Intimate Partner Violence:   . Fear of Current or Ex-Partner: Not on file  . Emotionally Abused: Not on file  . Physically Abused: Not on file  . Sexually Abused: Not on file    Family History  Problem Relation Age of Onset  . COPD Neg Hx   . Diabetes Neg Hx   . Hypertension Neg Hx   . CAD Neg Hx     No Known Allergies   Review of Systems   Review of Systems: Negative Unless Checked Constitutional: [] Weight loss  [] Fever  [] Chills Cardiac: [] Chest pain   []  Atrial Fibrillation  [] Palpitations   [] Shortness of breath when laying flat   [] Shortness of breath with exertion. [] Shortness of breath at rest Vascular:  [] Pain in legs with walking   [] Pain in legs with standing [] Pain in legs when laying flat   [] Claudication    [] Pain in feet when laying flat    [] History of DVT   [] Phlebitis   [] Swelling in legs   [] Varicose veins   [] Non-healing ulcers Pulmonary:   [] Uses home oxygen   [] Productive cough   [] Hemoptysis   [] Wheeze  [] COPD   [] Asthma Neurologic:  [] Dizziness   [] Seizures  [] Blackouts [] History of stroke   [] History of TIA  [] Aphasia   [] Temporary Blindness   [] Weakness or numbness in arm   [] Weakness or numbness in  leg Musculoskeletal:   [] Joint swelling   [] Joint pain   [] Low back pain  []  History of Knee Replacement [] Arthritis [] back Surgeries  []  Spinal Stenosis    Hematologic:  [] Easy bruising  [] Easy bleeding   [] Hypercoagulable state   [x] Anemic Gastrointestinal:  [] Diarrhea   [] Vomiting  [] Gastroesophageal reflux/heartburn   [] Difficulty swallowing. [] Abdominal pain Genitourinary:  [x] Chronic kidney disease   [] Difficult urination  [] Anuric   [] Blood in urine [] Frequent urination  [] Burning with urination   [] Hematuria Skin:  [] Rashes   [] Ulcers [] Wounds Psychological:  [x] History of anxiety   []  History of major depression  []  Memory Difficulties      OBJECTIVE:   Physical Exam  BP 134/75 (BP Location: Right Wrist)  Pulse (!) 121   Resp 12   Ht 5\' 3"  (1.6 m)   Wt 205 lb (93 kg)   LMP 08/31/2018   BMI 36.31 kg/m   Gen: WD/WN, NAD Head: [place]/AT, No temporalis wasting.  Ear/Nose/Throat: Hearing grossly intact, nares w/o erythema or drainage Eyes: PER, EOMI, sclera nonicteric.  Neck: Supple, no masses.  No JVD.  Pulmonary:  Good air movement, no use of accessory muscles.  Cardiac: RRR Vascular:  Good thrill and bruit Vessel Right Left  Radial Palpable Palpable   Gastrointestinal: soft, non-distended. No guarding/no peritoneal signs.  Musculoskeletal: M/S 5/5 throughout.  No deformity or atrophy.  Neurologic: Pain and light touch intact in extremities.  Symmetrical.  Speech is fluent. Motor exam as listed above. Psychiatric: Judgment intact, Mood & affect appropriate for pt's clinical situation.       ASSESSMENT AND PLAN:  1. ESRD on dialysis [facility]) Currently the patient's flow volume has greatly increased and her fistula is far more palpable than it was previously.  However I believe it will still be fairly difficult for technicians to stick it without great issue.  Since its only been a month since the patient's intervention we will give it another 4 weeks just to see if the  fistula become slightly more palpable.  We will also repeat HDA due to the fact that there were some significantly elevated velocities at the anastomosis of the in the distal upper arm.  If these but velocities persist we may plan on another fistulogram.  Also if the fistula still remains very hard to palpate despite this time, we will also plan on making the fistula more superficial.  2. Essential hypertension Continue antihypertensive medications as already ordered, these medications have been reviewed and there are no changes at this time.    Current Outpatient Medications on File Prior to Visit  Medication Sig Dispense Refill  . acetaminophen (OFIRMEV) 10 MG/ML SOLN Take by mouth.    . butalbital-aspirin-caffeine (FIORINAL) 50-325-40 MG capsule Take 1 capsule by mouth 2 (two) times daily as needed for headache (for migraine).    . carvedilol (COREG) 25 MG tablet Take 1 tablet by mouth once.     . clonazePAM (KLONOPIN) 0.5 MG tablet Take 1 tablet by mouth daily as needed.     . diltiazem (CARDIZEM SR) 60 MG 12 hr capsule Take 60 mg by mouth 2 (two) times daily.    . ferric citrate (AURYXIA) 1 GM 210 MG(Fe) tablet Take 420 mg by mouth 3 (three) times daily with meals.    . fluticasone (FLONASE) 50 MCG/ACT nasal spray Place 1 spray into both nostrils 2 (two) times daily.    . iron sucrose in sodium chloride 0.9 % 100 mL Iron Sucrose (Venofer)    . lidocaine-prilocaine (EMLA) cream PLEASE SEE ATTACHED FOR DETAILED DIRECTIONS    . losartan (COZAAR) 25 MG tablet Take 25 mg by mouth daily.    . Methoxy PEG-Epoetin Beta (MIRCERA IJ) Mircera    . nortriptyline (PAMELOR) 10 MG capsule Take 2 capsules by mouth at bedtime.     . ondansetron (ZOFRAN) 4 MG tablet Take by mouth.    . sertraline (ZOLOFT) 25 MG tablet Take 1 tablet by mouth 1 day or 1 dose.    Marland Kitchen amLODipine (NORVASC) 10 MG tablet Take 1 tablet by mouth 1 day or 1 dose.    Marland Kitchen HYDROcodone-acetaminophen (NORCO) 5-325 MG tablet Take 1-2 tablets  by mouth every 6 (six) hours as needed for moderate pain. (  Patient not taking: Reported on 05/21/2019) 30 tablet 0  . Vitamin D, Ergocalciferol, (DRISDOL) 1.25 MG (50000 UT) CAPS capsule Take 1 capsule by mouth once a week.     No current facility-administered medications on file prior to visit.    There are no Patient Instructions on file for this visit. No follow-ups on file.   Kris Hartmann, NP  This note was completed with Sales executive.  Any errors are purely unintentional.

## 2019-08-22 ENCOUNTER — Other Ambulatory Visit (INDEPENDENT_AMBULATORY_CARE_PROVIDER_SITE_OTHER): Payer: Self-pay | Admitting: Nurse Practitioner

## 2019-08-22 DIAGNOSIS — N186 End stage renal disease: Secondary | ICD-10-CM

## 2019-08-25 ENCOUNTER — Ambulatory Visit (INDEPENDENT_AMBULATORY_CARE_PROVIDER_SITE_OTHER): Payer: BC Managed Care – PPO

## 2019-08-25 ENCOUNTER — Other Ambulatory Visit: Payer: Self-pay

## 2019-08-25 ENCOUNTER — Ambulatory Visit (INDEPENDENT_AMBULATORY_CARE_PROVIDER_SITE_OTHER): Payer: BC Managed Care – PPO | Admitting: Nurse Practitioner

## 2019-08-25 ENCOUNTER — Encounter (INDEPENDENT_AMBULATORY_CARE_PROVIDER_SITE_OTHER): Payer: Self-pay | Admitting: Nurse Practitioner

## 2019-08-25 VITALS — BP 128/88 | HR 118 | Resp 12 | Ht 63.0 in | Wt 210.0 lb

## 2019-08-25 DIAGNOSIS — N186 End stage renal disease: Secondary | ICD-10-CM | POA: Diagnosis not present

## 2019-08-25 DIAGNOSIS — I10 Essential (primary) hypertension: Secondary | ICD-10-CM | POA: Diagnosis not present

## 2019-08-25 DIAGNOSIS — Z992 Dependence on renal dialysis: Secondary | ICD-10-CM

## 2019-08-25 NOTE — Progress Notes (Signed)
SUBJECTIVE:  Patient ID: Jennifer Newman, female    DOB: 1965-12-06, 54 y.o.   MRN: [id number] Chief Complaint  Patient presents with  . Follow-up    4wk Hda    HPI  Jennifer Newman is a 54 y.o. female that returns for follow-up studies and evaluation of her left brachiocephalic AV fistula.  The fistula was originally created on 04/16/2019.  However, the patient had some issues with allowing the fistula to mature properly.  Following an angiogram on 06/30/2019, the flow volume of her fistula was greatly improved however it still felt somewhat difficult to palpate.  Today, the fistula still is somewhat deep however the fistula is larger in size and much easier to palpate.  The patient denies having any fever, chills, nausea, vomiting or issues with dialysis or PermCath.  Today noninvasive studies show a flow volume 2248.  There is still elevated velocities at the anastomosis however not hemodynamically significant.  The distal AV fistula appears slightly narrower.  Anatomy at the mid segment has a shelflike appearance without elevation in velocity.  Past Medical History:  Diagnosis Date  . Anxiety   . Bipolar disorder ([place])   . Chronic kidney disease   . Depression   . Dysrhythmia    tachycardia  . GERD (gastroesophageal reflux disease)   . Hypertension   . Obsessive-compulsive disorder   . PTSD (post-traumatic stress disorder)   . Tachycardia, unspecified   . Vertigo     Past Surgical History:  Procedure Laterality Date  . A/V FISTULAGRAM Left 06/30/2019   Procedure: A/V FISTULAGRAM;  Surgeon: Algernon Huxley, MD;  Location: [place] [facility];  Service: Cardiovascular;  Laterality: Left;  . AV FISTULA PLACEMENT Left 02/26/2019   Procedure: ARTERIOVENOUS (AV) FISTULA CREATION ( RADIAL CEPHALIC );  Surgeon: Algernon Huxley, MD;  Location: [facility];  Service: Vascular;  Laterality: Left;  . AV FISTULA PLACEMENT Left 04/16/2019   Procedure: ARTERIOVENOUS (AV) FISTULA CREATION (  BRACHIAL CEPHALIC );  Surgeon: Algernon Huxley, MD;  Location: [facility];  Service: Vascular;  Laterality: Left;  . DIALYSIS/PERMA CATHETER INSERTION N/A 08/22/2018   Procedure: DIALYSIS temporary catheter INSERTION;  Surgeon: Algernon Huxley, MD;  Location: [place] [facility];  Service: Cardiovascular;  Laterality: N/A;  . DIALYSIS/PERMA CATHETER INSERTION N/A 08/26/2018   Procedure: DIALYSIS/PERMA CATHETER INSERTION;  Surgeon: Algernon Huxley, MD;  Location: [place] [facility];  Service: Cardiovascular;  Laterality: N/A;  . DIALYSIS/PERMA CATHETER INSERTION N/A 01/01/2019   Procedure: DIALYSIS/PERMA CATHETER INSERTION;  Surgeon: Algernon Huxley, MD;  Location: [place] [facility];  Service: Cardiovascular;  Laterality: N/A;  . none    . PERIPHERAL VASCULAR THROMBECTOMY Left 03/12/2019   Procedure: PERIPHERAL VASCULAR THROMBECTOMY;  Surgeon: Algernon Huxley, MD;  Location: [place] [facility];  Service: Cardiovascular;  Laterality: Left;    Social History   Socioeconomic History  . Marital status: Married    Spouse name: Corene Cornea  . Number of children: 2  . Years of education: Not on file  . Highest education level: Associate degree: occupational, Hotel manager, or vocational program  Occupational History  . 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 Smoking status: Never Smoker  . Smokeless tobacco: Never Used  Substance and Sexual Activity  . Alcohol use: Not Currently    Alcohol/week: 0.0 standard drinks  . Drug use: Not Currently    Types: Marijuana  Comment: > 33yrs  . Sexual activity: Not Currently  Other Topics Concern  . Not on file  Social History Narrative  . Not on file   Social Determinants of Health   Financial Resource Strain:   . Difficulty of Paying Living Expenses: Not on file  Food Insecurity:   . Worried About Charity fundraiser in the Last Year: Not on file  . Ran Out of Food in the Last Year: Not on file    Transportation Needs:   . Lack of Transportation (Medical): Not on file  . Lack of Transportation (Non-Medical): Not on file  Physical Activity:   . Days of Exercise per Week: Not on file  . Minutes of Exercise per Session: Not on file  Stress:   . Feeling of Stress : Not on file  Social Connections:   . Frequency of Communication with Friends and Family: Not on file  . Frequency of Social Gatherings with Friends and Family: Not on file  . Attends Religious Services: Not on file  . Active Member of Clubs or Organizations: Not on file  . Attends Archivist Meetings: Not on file  . Marital Status: Not on file  Intimate Partner Violence:   . Fear of Current or Ex-Partner: Not on file  . Emotionally Abused: Not on file  . Physically Abused: Not on file  . Sexually Abused: Not on file    Family History  Problem Relation Age of Onset  . COPD Neg Hx   . Diabetes Neg Hx   . Hypertension Neg Hx   . CAD Neg Hx     No Known Allergies   Review of Systems   Review of Systems: Negative Unless Checked Constitutional: [] Weight loss  [] Fever  [] Chills Cardiac: [] Chest pain   []  Atrial Fibrillation  [] Palpitations   [] Shortness of breath when laying flat   [] Shortness of breath with exertion. [] Shortness of breath at rest Vascular:  [] Pain in legs with walking   [] Pain in legs with standing [] Pain in legs when laying flat   [] Claudication    [] Pain in feet when laying flat    [] History of DVT   [] Phlebitis   [] Swelling in legs   [] Varicose veins   [] Non-healing ulcers Pulmonary:   [] Uses home oxygen   [] Productive cough   [] Hemoptysis   [] Wheeze  [] COPD   [] Asthma Neurologic:  [] Dizziness   [] Seizures  [] Blackouts [] History of stroke   [] History of TIA  [] Aphasia   [] Temporary Blindness   [] Weakness or numbness in arm   [] Weakness or numbness in leg Musculoskeletal:   [] Joint swelling   [] Joint pain   [] Low back pain  []  History of Knee Replacement [] Arthritis [] back Surgeries  []   Spinal Stenosis    Hematologic:  [] Easy bruising  [] Easy bleeding   [] Hypercoagulable state   [x] Anemic Gastrointestinal:  [] Diarrhea   [] Vomiting  [] Gastroesophageal reflux/heartburn   [] Difficulty swallowing. [] Abdominal pain Genitourinary:  [x] Chronic kidney disease   [] Difficult urination  [] Anuric   [] Blood in urine [] Frequent urination  [] Burning with urination   [] Hematuria Skin:  [] Rashes   [] Ulcers [] Wounds Psychological:  [x] History of anxiety   []  History of major depression  []  Memory Difficulties      OBJECTIVE:   Physical Exam  BP 128/88 (BP Location: Right Arm)   Pulse (!) 118   Resp 12   Ht 5\' 3"  (1.6 m)   Wt 210 lb (95.3 kg)   LMP 08/31/2018   BMI 37.20  kg/m   Gen: WD/WN, NAD Head: [place]/AT, No temporalis wasting.  Ear/Nose/Throat: Hearing grossly intact, nares w/o erythema or drainage Eyes: PER, EOMI, sclera nonicteric.  Neck: Supple, no masses.  No JVD.  Pulmonary:  Good air movement, no use of accessory muscles.  Cardiac: RRR Vascular:  Good thrill and bruit Vessel Right Left  Radial Palpable Palpable   Gastrointestinal: soft, non-distended. No guarding/no peritoneal signs.  Musculoskeletal: M/S 5/5 throughout.  No deformity or atrophy.  Neurologic: Pain and light touch intact in extremities.  Symmetrical.  Speech is fluent. Motor exam as listed above. Psychiatric: Judgment intact, Mood & affect appropriate for pt's clinical situation.       ASSESSMENT AND PLAN:  1. ESRD on dialysis [facility]) The patient's fistula is much more palpable today.  He is still somewhat on the deeper side however I feel that the technicians should be able to palpate enough to begin dialysis.  We will attempt to allow the patient be to begin dialysis to see how it runs.  If the patient starts to encounter issues with access we may still have to plan to superficial as her fistula.  Otherwise, we will have the patient return in 6 months for routine follow-up with noninvasive  studies.  2. Essential hypertension Continue antihypertensive medications as already ordered, these medications have been reviewed and there are no changes at this time.    Current Outpatient Medications on File Prior to Visit  Medication Sig Dispense Refill  . acetaminophen (OFIRMEV) 10 MG/ML SOLN Take by mouth.    . butalbital-aspirin-caffeine (FIORINAL) 50-325-40 MG capsule Take 1 capsule by mouth 2 (two) times daily as needed for headache (for migraine).    . clonazePAM (KLONOPIN) 0.5 MG tablet Take 1 tablet by mouth daily as needed.     . diltiazem (CARDIZEM SR) 60 MG 12 hr capsule Take 60 mg by mouth 2 (two) times daily.    . ferric citrate (AURYXIA) 1 GM 210 MG(Fe) tablet Take 420 mg by mouth 3 (three) times daily with meals.    . fluticasone (FLONASE) 50 MCG/ACT nasal spray Place 1 spray into both nostrils 2 (two) times daily.    . iron sucrose in sodium chloride 0.9 % 100 mL Iron Sucrose (Venofer)    . lidocaine-prilocaine (EMLA) cream PLEASE SEE ATTACHED FOR DETAILED DIRECTIONS    . losartan (COZAAR) 25 MG tablet Take 25 mg by mouth daily.    . Methoxy PEG-Epoetin Beta (MIRCERA IJ) Mircera    . nortriptyline (PAMELOR) 10 MG capsule Take 2 capsules by mouth at bedtime.     . ondansetron (ZOFRAN) 4 MG tablet Take by mouth.    . sertraline (ZOLOFT) 25 MG tablet Take 1 tablet by mouth 1 day or 1 dose.    Marland Kitchen amLODipine (NORVASC) 10 MG tablet Take 1 tablet by mouth 1 day or 1 dose.    . carvedilol (COREG) 25 MG tablet Take 1 tablet by mouth once.     Marland Kitchen HYDROcodone-acetaminophen (NORCO) 5-325 MG tablet Take 1-2 tablets by mouth every 6 (six) hours as needed for moderate pain. (Patient not taking: Reported on 05/21/2019) 30 tablet 0  . Vitamin D, Ergocalciferol, (DRISDOL) 1.25 MG (50000 UT) CAPS capsule Take 1 capsule by mouth once a week.     No current facility-administered medications on file prior to visit.    There are no Patient Instructions on file for this visit. No follow-ups  on file.   Kris Hartmann, NP  This note was completed  with Sales executive.  Any errors are purely unintentional.

## 2019-09-29 ENCOUNTER — Other Ambulatory Visit: Payer: Self-pay | Admitting: Pediatrics

## 2019-09-29 DIAGNOSIS — Z1231 Encounter for screening mammogram for malignant neoplasm of breast: Secondary | ICD-10-CM

## 2019-10-09 ENCOUNTER — Other Ambulatory Visit (INDEPENDENT_AMBULATORY_CARE_PROVIDER_SITE_OTHER): Payer: Self-pay | Admitting: Nurse Practitioner

## 2019-10-09 DIAGNOSIS — T829XXS Unspecified complication of cardiac and vascular prosthetic device, implant and graft, sequela: Secondary | ICD-10-CM

## 2019-10-10 ENCOUNTER — Other Ambulatory Visit: Payer: Self-pay

## 2019-10-10 ENCOUNTER — Encounter (INDEPENDENT_AMBULATORY_CARE_PROVIDER_SITE_OTHER): Payer: Self-pay | Admitting: Nurse Practitioner

## 2019-10-10 ENCOUNTER — Ambulatory Visit (INDEPENDENT_AMBULATORY_CARE_PROVIDER_SITE_OTHER): Payer: BC Managed Care – PPO | Admitting: Nurse Practitioner

## 2019-10-10 ENCOUNTER — Ambulatory Visit (INDEPENDENT_AMBULATORY_CARE_PROVIDER_SITE_OTHER): Payer: BC Managed Care – PPO

## 2019-10-10 VITALS — BP 110/74 | HR 48 | Resp 16 | Ht 63.0 in | Wt 211.0 lb

## 2019-10-10 DIAGNOSIS — T829XXS Unspecified complication of cardiac and vascular prosthetic device, implant and graft, sequela: Secondary | ICD-10-CM | POA: Diagnosis not present

## 2019-10-10 DIAGNOSIS — N186 End stage renal disease: Secondary | ICD-10-CM | POA: Diagnosis not present

## 2019-10-10 DIAGNOSIS — I10 Essential (primary) hypertension: Secondary | ICD-10-CM

## 2019-10-10 DIAGNOSIS — Z992 Dependence on renal dialysis: Secondary | ICD-10-CM

## 2019-10-10 NOTE — Progress Notes (Signed)
Subjective:    Patient ID: Jennifer Newman, female    DOB: [birth year], 54 y.o.   MRN: [id number] Chief Complaint  Patient presents with  . Follow-up    est pt. kolluru. hda     Today the patient presents today for evaluation of her dialysis access.  The patient had her brachiocephalic AV fistula created on 04/16/2019.  The patient had some difficulty with maturation and underwent angioplasty on 06/30/2019.  At the most recent visit the patient was given permission to try to utilize her dialysis access.  Per the patient recently they have not had good success with her dialysis access mainly due to the depth of her dialysis access.  There have been multiple infiltrations.  The patient denies any fever, chills, nausea, vomiting or diarrhea.  She denies any swelling during dialysis or signs and symptoms of steal syndrome.  Today the patient has a flow volume of 1334.  Left distal radial artery flow is antegrade.  The patient does have some velocity increase at the distal upper arm and antecubital fossa however they are not significant.  There are 2 subcentimeter cystic areas with no internal flow or vascular communication noted medial to the left of the mid upper arm outflow vein.  It is also noted that near the proximal portion the depth is 1.33 cm.   Review of Systems  Hematological: Bruises/bleeds easily.  All other systems reviewed and are negative.      Objective:   Physical Exam Vitals reviewed.  HENT:     Head: Normocephalic.  Eyes:     Pupils: Pupils are equal, round, and reactive to light.  Cardiovascular:     Rate and Rhythm: Rhythm irregular.     Pulses:          Radial pulses are 2+ on the left side.     Arteriovenous access: left arteriovenous access is present.    Comments: Good thrill and bruit, deep fistula Pulmonary:     Effort: Pulmonary effort is normal.  Skin:    Findings: Bruising present.  Neurological:     General: No focal deficit present.     Mental  Status: She is alert and oriented to person, place, and time.  Psychiatric:        Mood and Affect: Mood normal.        Behavior: Behavior normal.        Thought Content: Thought content normal.        Judgment: Judgment normal.     BP 110/74 (BP Location: Right Arm)   Pulse (!) 48   Resp 16   Ht 5\' 3"  (1.6 m)   Wt 211 lb (95.7 kg)   LMP 08/31/2018   BMI 37.38 kg/m   Past Medical History:  Diagnosis Date  . Anxiety   . Bipolar disorder ([place])   . Chronic kidney disease   . Depression   . Dysrhythmia    tachycardia  . GERD (gastroesophageal reflux disease)   . Hypertension   . Obsessive-compulsive disorder   . PTSD (post-traumatic stress disorder)   . Tachycardia, unspecified   . Vertigo     Social History   Socioeconomic History  . Marital status: Married    Spouse name: Corene Cornea  . Number of children: 2  . Years of education: Not on file  . Highest education level: Associate degree: occupational, Hotel manager, or vocational program  Occupational History  . 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 Smoking status: Never Smoker  . Smokeless tobacco: Never Used  Substance and Sexual Activity  . Alcohol use: Not Currently    Alcohol/week: 0.0 standard drinks  . Drug use: Not Currently    Types: Marijuana    Comment: > 33yrs  . Sexual activity: Not Currently  Other Topics Concern  . Not on file  Social History Narrative  . Not on file   Social Determinants of Health   Financial Resource Strain:   . Difficulty of Paying Living Expenses:   Food Insecurity:   . Worried About Charity fundraiser in the Last Year:   . Arboriculturist in the Last Year:   Transportation Needs:   . Film/video editor (Medical):   Marland Kitchen Lack of Transportation (Non-Medical):   Physical Activity:   . Days of Exercise per Week:   . Minutes of Exercise per Session:   Stress:   . Feeling of Stress :   Social Connections:   .  Frequency of Communication with Friends and Family:   . Frequency of Social Gatherings with Friends and Family:   . Attends Religious Services:   . Active Member of Clubs or Organizations:   . Attends Archivist Meetings:   Marland Kitchen Marital Status:   Intimate Partner Violence:   . Fear of Current or Ex-Partner:   . Emotionally Abused:   Marland Kitchen Physically Abused:   . Sexually Abused:     Past Surgical History:  Procedure Laterality Date  . A/V FISTULAGRAM Left 06/30/2019   Procedure: A/V FISTULAGRAM;  Surgeon: Algernon Huxley, MD;  Location: [place] [facility];  Service: Cardiovascular;  Laterality: Left;  . AV FISTULA PLACEMENT Left 02/26/2019   Procedure: ARTERIOVENOUS (AV) FISTULA CREATION ( RADIAL CEPHALIC );  Surgeon: Algernon Huxley, MD;  Location: [facility];  Service: Vascular;  Laterality: Left;  . AV FISTULA PLACEMENT Left 04/16/2019   Procedure: ARTERIOVENOUS (AV) FISTULA CREATION ( BRACHIAL CEPHALIC );  Surgeon: Algernon Huxley, MD;  Location: [facility];  Service: Vascular;  Laterality: Left;  . DIALYSIS/PERMA CATHETER INSERTION N/A 08/22/2018   Procedure: DIALYSIS temporary catheter INSERTION;  Surgeon: Algernon Huxley, MD;  Location: [place] [facility];  Service: Cardiovascular;  Laterality: N/A;  . DIALYSIS/PERMA CATHETER INSERTION N/A 08/26/2018   Procedure: DIALYSIS/PERMA CATHETER INSERTION;  Surgeon: Algernon Huxley, MD;  Location: [place] [facility];  Service: Cardiovascular;  Laterality: N/A;  . DIALYSIS/PERMA CATHETER INSERTION N/A 01/01/2019   Procedure: DIALYSIS/PERMA CATHETER INSERTION;  Surgeon: Algernon Huxley, MD;  Location: [place] [facility];  Service: Cardiovascular;  Laterality: N/A;  . none    . PERIPHERAL VASCULAR THROMBECTOMY Left 03/12/2019   Procedure: PERIPHERAL VASCULAR THROMBECTOMY;  Surgeon: Algernon Huxley, MD;  Location: [place] [facility];  Service: Cardiovascular;  Laterality: Left;    Family History  Problem Relation Age of Onset  . COPD Neg Hx   . Diabetes Neg  Hx   . Hypertension Neg Hx   . CAD Neg Hx     No Known Allergies     Assessment & Plan:   1. ESRD on dialysis [facility]) Recommend:  At this time the patient does not have appropriate extremity access for dialysis  Patient should have her left brachiocephalic AV fistula superficialized  The risks, benefits and alternative therapies were reviewed in detail with the patient.  All questions were answered.  The patient agrees to proceed with surgery.  2. Essential hypertension Continue antihypertensive medications as already ordered, these medications have been reviewed and there are no changes at this time.    Current Outpatient Medications on File Prior to Visit  Medication Sig Dispense Refill  . amLODipine (NORVASC) 10 MG tablet Take 1 tablet by mouth 1 day or 1 dose.    . butalbital-aspirin-caffeine (FIORINAL) 50-325-40 MG capsule Take 1 capsule by mouth 2 (two) times daily as needed for headache (for migraine).    . carvedilol (COREG) 25 MG tablet Take 1 tablet by mouth once.     . clonazePAM (KLONOPIN) 0.5 MG tablet Take 1 tablet by mouth daily as needed.     . diltiazem (CARDIZEM SR) 60 MG 12 hr capsule Take 60 mg by mouth 2 (two) times daily.    . ferric citrate (AURYXIA) 1 GM 210 MG(Fe) tablet Take 420 mg by mouth 3 (three) times daily with meals.    . fluticasone (FLONASE) 50 MCG/ACT nasal spray Place 1 spray into both nostrils 2 (two) times daily.    . iron sucrose in sodium chloride 0.9 % 100 mL Iron Sucrose (Venofer)    . lidocaine-prilocaine (EMLA) cream PLEASE SEE ATTACHED FOR DETAILED DIRECTIONS    . losartan (COZAAR) 25 MG tablet Take 25 mg by mouth daily.    . Methoxy PEG-Epoetin Beta (MIRCERA IJ) Mircera    . nortriptyline (PAMELOR) 10 MG capsule Take 2 capsules by mouth at bedtime.     . ondansetron (ZOFRAN) 4 MG tablet Take by mouth.    . Vitamin D, Ergocalciferol, (DRISDOL) 1.25 MG (50000 UT) CAPS capsule Take 1 capsule by mouth once a week.    Marland Kitchen  HYDROcodone-acetaminophen (NORCO) 5-325 MG tablet Take 1-2 tablets by mouth every 6 (six) hours as needed for moderate pain. (Patient not taking: Reported on 05/21/2019) 30 tablet 0  . sertraline (ZOLOFT) 25 MG tablet Take 1 tablet by mouth 1 day or 1 dose.     No current facility-administered medications on file prior to visit.    There are no Patient Instructions on file for this visit. No follow-ups on file.   Kris Hartmann, NP

## 2019-10-13 ENCOUNTER — Telehealth (INDEPENDENT_AMBULATORY_CARE_PROVIDER_SITE_OTHER): Payer: Self-pay

## 2019-10-13 NOTE — Telephone Encounter (Signed)
I attempted to contact the patient to go over the pre-surgical instructions that we discussed earlier. A message was left for a return call and to let her know that I will be putting everything in the mail to her. Spoke with the patient and discussed all appointments and pre-surgical instructions which will be mailed to the patient.

## 2019-10-16 ENCOUNTER — Other Ambulatory Visit (INDEPENDENT_AMBULATORY_CARE_PROVIDER_SITE_OTHER): Payer: Self-pay | Admitting: Nurse Practitioner

## 2019-10-17 ENCOUNTER — Encounter
Admission: RE | Admit: 2019-10-17 | Discharge: 2019-10-17 | Disposition: A | Discharge status: Home / Self Care | Payer: BC Managed Care – PPO | Source: Ambulatory Visit | Attending: Vascular Surgery | Admitting: Vascular Surgery

## 2019-10-17 ENCOUNTER — Other Ambulatory Visit: Payer: Self-pay

## 2019-10-17 NOTE — Patient Instructions (Signed)
Your procedure is scheduled on: 10/29/19 Report to [facility]. To find out your arrival time please call [phone] between 1PM - 3PM on 10/28/19.  Remember: Instructions that are not followed completely may result in serious medical risk, up to and including death, or upon the discretion of your surgeon and anesthesiologist your surgery may need to be rescheduled.     _X__ 1. Do not eat food after midnight the night before your procedure.                 No gum chewing or hard candies. You may drink clear liquids up to 2 hours                 before you are scheduled to arrive for your surgery- DO not drink clear                 liquids within 2 hours of the start of your surgery.                 Clear Liquids include:  water, apple juice without pulp, clear carbohydrate                 drink such as Clearfast or Gatorade, Black Coffee or Tea (Do not add                 anything to coffee or tea). Diabetics water only  __X__2.  On the morning of surgery brush your teeth with toothpaste and water, you                 may rinse your mouth with mouthwash if you wish.  Do not swallow any              toothpaste of mouthwash.     _X__ 3.  No Alcohol for 24 hours before or after surgery.   _X__ 4.  Do Not Smoke or use e-cigarettes For 24 Hours Prior to Your Surgery.                 Do not use any chewable tobacco products for at least 6 hours prior to                 surgery.  ____  5.  Bring all medications with you on the day of surgery if instructed.   __X__  6.  Notify your doctor if there is any change in your medical condition      (cold, fever, infections).     Do not wear jewelry, make-up, hairpins, clips or nail polish. Do not wear lotions, powders, or perfumes.  Do not shave 48 hours prior to surgery. Men may shave face and neck. Do not bring valuables to the hospital.    [facility] is not responsible for any belongings or  valuables.  Contacts, dentures/partials or body piercings may not be worn into surgery. Bring a case for your contacts, glasses or hearing aids, a denture cup will be supplied. Leave your suitcase in the car. After surgery it may be brought to your room. For patients admitted to the hospital, discharge time is determined by your treatment team.   Patients discharged the day of surgery will not be allowed to drive home.   Please read over the following fact sheets that you were given:   MRSA Information  __X__ Take these medicines the morning of surgery with A SIP OF WATER:  1. diltiazem (CARDIZEM SR) 60 MG 12 hr capsule  2. sertraline (ZOLOFT) 25 MG tablet  3. clonazePAM (KLONOPIN) 0.5 MG tablet if needed  4. fluticasone (FLONASE) 50 MCG/ACT nasal spray if needed  5.  6.  ____ Fleet Enema (as directed)   __X__ Use CHG Soap/SAGE wipes as directed  ____ Use inhalers on the day of surgery  ____ Stop metformin/Janumet/Farxiga 2 days prior to surgery    ____ Take 1/2 of usual insulin dose the night before surgery. No insulin the morning          of surgery.   ____ Stop Blood Thinners Coumadin/Plavix/Xarelto/Pleta/Pradaxa/Eliquis/Effient/Aspirin  on   Or contact your Surgeon, Cardiologist or Medical Doctor regarding  ability to stop your blood thinners  __X__ Stop Anti-inflammatories 7 days before surgery such as Advil, Ibuprofen, Motrin,  BC or Goodies Powder, Naprosyn, Naproxen, Aleve, Aspirin    __X__ Stop all herbal supplements, fish oil or vitamin E until after surgery.    ____ Bring C-Pap to the hospital.

## 2019-10-20 ENCOUNTER — Encounter
Admission: RE | Admit: 2019-10-20 | Discharge: 2019-10-20 | Disposition: A | Discharge status: Home / Self Care | Payer: BC Managed Care – PPO | Source: Ambulatory Visit | Attending: Vascular Surgery | Admitting: Vascular Surgery

## 2019-10-20 ENCOUNTER — Other Ambulatory Visit: Payer: Self-pay

## 2019-10-20 DIAGNOSIS — Z01818 Encounter for other preprocedural examination: Secondary | ICD-10-CM | POA: Diagnosis not present

## 2019-10-20 DIAGNOSIS — Z01810 Encounter for preprocedural cardiovascular examination: Secondary | ICD-10-CM | POA: Diagnosis not present

## 2019-10-20 LAB — APTT: aPTT: 28 seconds (ref 24–36)

## 2019-10-20 LAB — BASIC METABOLIC PANEL
Anion gap: 14 (ref 5–15)
BUN: 51 mg/dL — ABNORMAL HIGH (ref 6–20)
CO2: 26 mmol/L (ref 22–32)
Calcium: 9.4 mg/dL (ref 8.9–10.3)
Chloride: 96 mmol/L — ABNORMAL LOW (ref 98–111)
Creatinine, Ser: 3.9 mg/dL — ABNORMAL HIGH (ref 0.44–1.00)
GFR calc Af Amer: 15 mL/min — ABNORMAL LOW (ref >60)
GFR calc non Af Amer: 13 mL/min — ABNORMAL LOW (ref >60)
Glucose, Bld: 92 mg/dL (ref 70–99)
Potassium: 3.9 mmol/L (ref 3.5–5.1)
Sodium: 136 mmol/L (ref 135–145)

## 2019-10-20 LAB — CBC WITH DIFFERENTIAL/PLATELET
Abs Immature Granulocytes: 0.03 10*3/uL (ref 0.00–0.07)
Basophils Absolute: 0.1 10*3/uL (ref 0.0–0.1)
Basophils Relative: 1 %
Eosinophils Absolute: 0.3 10*3/uL (ref 0.0–0.5)
Eosinophils Relative: 4 %
HCT: 33.7 % — ABNORMAL LOW (ref 36.0–46.0)
Hemoglobin: 11.2 g/dL — ABNORMAL LOW (ref 12.0–15.0)
Immature Granulocytes: 0 %
Lymphocytes Relative: 28 %
Lymphs Abs: 2 10*3/uL (ref 0.7–4.0)
MCH: 32.2 pg (ref 26.0–34.0)
MCHC: 33.2 g/dL (ref 30.0–36.0)
MCV: 96.8 fL (ref 80.0–100.0)
Monocytes Absolute: 0.9 10*3/uL (ref 0.1–1.0)
Monocytes Relative: 12 %
Neutro Abs: 3.7 10*3/uL (ref 1.7–7.7)
Neutrophils Relative %: 55 %
Platelets: 276 10*3/uL (ref 150–400)
RBC: 3.48 MIL/uL — ABNORMAL LOW (ref 3.87–5.11)
RDW: 13.2 % (ref 11.5–15.5)
WBC: 6.9 10*3/uL (ref 4.0–10.5)
nRBC: 0 % (ref 0.0–0.2)

## 2019-10-20 LAB — TYPE AND SCREEN
ABO/RH(D): A POS
Antibody Screen: NEGATIVE

## 2019-10-20 LAB — PROTIME-INR
INR: 0.9 (ref 0.8–1.2)
Prothrombin Time: 12.3 seconds (ref 11.4–15.2)

## 2019-10-20 NOTE — Pre-Procedure Instructions (Signed)
Pre-Admit Testing Provider Communication Note  Provider: Dr. Andree Elk, Anesthesia  Notification Mode: Secure Chat  Reason: EKG  Response:    Additional Information: Noted on PAT worksheet. Placed on Chart.  Signed: Beulah Gandy, RN

## 2019-10-20 NOTE — Pre-Procedure Instructions (Signed)
Pre-Admit Testing Provider Notification Note  Provider Notified: Dr. Lucky Cowboy  Notification Mode: Fax  Reason: Abnormal lab result.  Response: Fax confirmation received.   Additional Information: Placed on chart. Noted on Pre-Admit Worksheet.  Signed: Beulah Gandy, RN

## 2019-10-27 ENCOUNTER — Other Ambulatory Visit
Admission: RE | Admit: 2019-10-27 | Discharge: 2019-10-27 | Disposition: A | Discharge status: Home / Self Care | Payer: BC Managed Care – PPO | Source: Ambulatory Visit | Attending: Vascular Surgery | Admitting: Vascular Surgery

## 2019-10-27 ENCOUNTER — Other Ambulatory Visit: Payer: Self-pay

## 2019-10-27 DIAGNOSIS — Z20822 Contact with and (suspected) exposure to covid-19: Secondary | ICD-10-CM | POA: Diagnosis not present

## 2019-10-27 DIAGNOSIS — Z01812 Encounter for preprocedural laboratory examination: Secondary | ICD-10-CM | POA: Diagnosis not present

## 2019-10-28 LAB — SARS CORONAVIRUS 2 (TAT 6-24 HRS): SARS Coronavirus 2: NEGATIVE

## 2019-10-29 ENCOUNTER — Ambulatory Visit: Payer: BC Managed Care – PPO | Admitting: Certified Registered"

## 2019-10-29 ENCOUNTER — Other Ambulatory Visit: Payer: Self-pay

## 2019-10-29 ENCOUNTER — Encounter
Admission: RE | Disposition: A | Discharge status: Home / Self Care | Payer: Self-pay | Source: Home / Self Care | Attending: Vascular Surgery

## 2019-10-29 ENCOUNTER — Encounter: Payer: Self-pay | Admitting: Vascular Surgery

## 2019-10-29 ENCOUNTER — Ambulatory Visit
Admission: RE | Admit: 2019-10-29 | Discharge: 2019-10-29 | Disposition: A | Discharge status: Home / Self Care | Payer: BC Managed Care – PPO | Attending: Vascular Surgery | Admitting: Vascular Surgery

## 2019-10-29 DIAGNOSIS — T82898A Other specified complication of vascular prosthetic devices, implants and grafts, initial encounter: Secondary | ICD-10-CM | POA: Diagnosis not present

## 2019-10-29 DIAGNOSIS — N186 End stage renal disease: Secondary | ICD-10-CM | POA: Insufficient documentation

## 2019-10-29 DIAGNOSIS — Z79899 Other long term (current) drug therapy: Secondary | ICD-10-CM | POA: Diagnosis not present

## 2019-10-29 DIAGNOSIS — K219 Gastro-esophageal reflux disease without esophagitis: Secondary | ICD-10-CM | POA: Diagnosis not present

## 2019-10-29 DIAGNOSIS — I120 Hypertensive chronic kidney disease with stage 5 chronic kidney disease or end stage renal disease: Secondary | ICD-10-CM | POA: Insufficient documentation

## 2019-10-29 DIAGNOSIS — Z992 Dependence on renal dialysis: Secondary | ICD-10-CM | POA: Insufficient documentation

## 2019-10-29 DIAGNOSIS — Z95828 Presence of other vascular implants and grafts: Secondary | ICD-10-CM | POA: Diagnosis not present

## 2019-10-29 HISTORY — PX: FISTULA SUPERFICIALIZATION: SHX6341

## 2019-10-29 LAB — POCT I-STAT, CHEM 8
BUN: 34 mg/dL — ABNORMAL HIGH (ref 6–20)
Calcium, Ion: 1.16 mmol/L (ref 1.15–1.40)
Chloride: 96 mmol/L — ABNORMAL LOW (ref 98–111)
Creatinine, Ser: 3.8 mg/dL — ABNORMAL HIGH (ref 0.44–1.00)
Glucose, Bld: 104 mg/dL — ABNORMAL HIGH (ref 70–99)
HCT: 37 % (ref 36.0–46.0)
Hemoglobin: 12.6 g/dL (ref 12.0–15.0)
Potassium: 3.5 mmol/L (ref 3.5–5.1)
Sodium: 135 mmol/L (ref 135–145)
TCO2: 28 mmol/L (ref 22–32)

## 2019-10-29 SURGERY — FISTULA SUPERFICIALIZATION
Anesthesia: General | Laterality: Left

## 2019-10-29 MED ORDER — BUPIVACAINE HCL (PF) 0.5 % IJ SOLN
INTRAMUSCULAR | Status: AC
  Filled 2019-10-29: qty 30

## 2019-10-29 MED ORDER — GLYCOPYRROLATE 0.2 MG/ML IJ SOLN
INTRAMUSCULAR | Status: AC
  Filled 2019-10-29: qty 1

## 2019-10-29 MED ORDER — CHLORHEXIDINE GLUCONATE CLOTH 2 % EX PADS: 6.0000 | MEDICATED_PAD | Freq: Once | CUTANEOUS | Status: DC

## 2019-10-29 MED ORDER — ONDANSETRON HCL 4 MG/2ML IJ SOLN
INTRAMUSCULAR | Status: AC
  Filled 2019-10-29: qty 2

## 2019-10-29 MED ORDER — HYDROCODONE-ACETAMINOPHEN 5-325 MG PO TABS: 1.0000 | ORAL_TABLET | Freq: Four times a day (QID) | ORAL | 0 refills | Status: DC | PRN

## 2019-10-29 MED ORDER — MIDAZOLAM HCL 2 MG/2ML IJ SOLN
INTRAMUSCULAR | Status: AC
  Filled 2019-10-29: qty 2

## 2019-10-29 MED ORDER — PHENYLEPHRINE HCL (PRESSORS) 10 MG/ML IV SOLN
INTRAVENOUS | Status: DC | PRN
  Administered 2019-10-29 (×2): 100 ug via INTRAVENOUS

## 2019-10-29 MED ORDER — ONDANSETRON HCL 4 MG/2ML IJ SOLN
INTRAMUSCULAR | Status: DC | PRN
  Administered 2019-10-29: 4 mg via INTRAVENOUS

## 2019-10-29 MED ORDER — MIDAZOLAM HCL 2 MG/2ML IJ SOLN
INTRAMUSCULAR | Status: DC | PRN
  Administered 2019-10-29: 1 mg via INTRAVENOUS

## 2019-10-29 MED ORDER — SODIUM CHLORIDE 0.9 % IV SOLN: INTRAVENOUS | Status: DC

## 2019-10-29 MED ORDER — PROPOFOL 10 MG/ML IV BOLUS
INTRAVENOUS | Status: AC
  Filled 2019-10-29: qty 20

## 2019-10-29 MED ORDER — OXYCODONE HCL 5 MG PO TABS: 5.0000 mg | ORAL_TABLET | Freq: Once | ORAL | Status: DC | PRN

## 2019-10-29 MED ORDER — CEFAZOLIN SODIUM-DEXTROSE 1-4 GM/50ML-% IV SOLN
INTRAVENOUS | Status: AC
  Filled 2019-10-29: qty 50

## 2019-10-29 MED ORDER — LIDOCAINE HCL (PF) 2 % IJ SOLN
INTRAMUSCULAR | Status: AC
  Filled 2019-10-29: qty 5

## 2019-10-29 MED ORDER — HEPARIN SODIUM (PORCINE) 5000 UNIT/ML IJ SOLN
INTRAMUSCULAR | Status: AC
  Filled 2019-10-29: qty 1

## 2019-10-29 MED ORDER — DEXAMETHASONE SODIUM PHOSPHATE 10 MG/ML IJ SOLN
INTRAMUSCULAR | Status: DC | PRN
  Administered 2019-10-29: 4 mg via INTRAVENOUS

## 2019-10-29 MED ORDER — FENTANYL CITRATE (PF) 100 MCG/2ML IJ SOLN
INTRAMUSCULAR | Status: DC | PRN
  Administered 2019-10-29 (×2): 50 ug via INTRAVENOUS

## 2019-10-29 MED ORDER — FENTANYL CITRATE (PF) 100 MCG/2ML IJ SOLN
INTRAMUSCULAR | Status: AC
  Filled 2019-10-29: qty 2

## 2019-10-29 MED ORDER — FAMOTIDINE 20 MG PO TABS
ORAL_TABLET | ORAL | Status: AC
  Administered 2019-10-29: 20 mg via ORAL
  Filled 2019-10-29: qty 1

## 2019-10-29 MED ORDER — SUCCINYLCHOLINE CHLORIDE 20 MG/ML IJ SOLN
INTRAMUSCULAR | Status: DC | PRN
  Administered 2019-10-29: 120 mg via INTRAVENOUS

## 2019-10-29 MED ORDER — OXYCODONE HCL 5 MG/5ML PO SOLN: 5.0000 mg | Freq: Once | ORAL | Status: DC | PRN

## 2019-10-29 MED ORDER — ONDANSETRON HCL 4 MG/2ML IJ SOLN
INTRAMUSCULAR | Status: AC
  Administered 2019-10-29: 4 mg
  Filled 2019-10-29: qty 2

## 2019-10-29 MED ORDER — SODIUM CHLORIDE 0.9 % IV SOLN
INTRAVENOUS | Status: DC | PRN
  Administered 2019-10-29: 30 ug/min via INTRAVENOUS

## 2019-10-29 MED ORDER — FAMOTIDINE 20 MG PO TABS: 20.0000 mg | ORAL_TABLET | Freq: Once | ORAL | Status: AC

## 2019-10-29 MED ORDER — SUCCINYLCHOLINE CHLORIDE 200 MG/10ML IV SOSY
PREFILLED_SYRINGE | INTRAVENOUS | Status: AC
  Filled 2019-10-29: qty 10

## 2019-10-29 MED ORDER — LIDOCAINE HCL (CARDIAC) PF 100 MG/5ML IV SOSY
PREFILLED_SYRINGE | INTRAVENOUS | Status: DC | PRN
  Administered 2019-10-29: 80 mg via INTRAVENOUS

## 2019-10-29 MED ORDER — PROPOFOL 10 MG/ML IV BOLUS
INTRAVENOUS | Status: DC | PRN
  Administered 2019-10-29: 160 mg via INTRAVENOUS

## 2019-10-29 MED ORDER — ONDANSETRON HCL 4 MG/2ML IJ SOLN
4.0000 mg | Freq: Once | INTRAMUSCULAR | Status: AC
  Administered 2019-10-29: 4 mg via INTRAVENOUS

## 2019-10-29 MED ORDER — DEXAMETHASONE SODIUM PHOSPHATE 10 MG/ML IJ SOLN
INTRAMUSCULAR | Status: AC
  Filled 2019-10-29: qty 1

## 2019-10-29 MED ORDER — FENTANYL CITRATE (PF) 100 MCG/2ML IJ SOLN
25.0000 ug | INTRAMUSCULAR | Status: DC | PRN
  Administered 2019-10-29: 50 ug via INTRAVENOUS

## 2019-10-29 MED ORDER — CEFAZOLIN SODIUM-DEXTROSE 1-4 GM/50ML-% IV SOLN
1.0000 g | INTRAVENOUS | Status: AC
  Administered 2019-10-29: 2 g via INTRAVENOUS

## 2019-10-29 MED ORDER — ROCURONIUM BROMIDE 10 MG/ML (PF) SYRINGE
PREFILLED_SYRINGE | INTRAVENOUS | Status: AC
  Filled 2019-10-29: qty 10

## 2019-10-29 SURGICAL SUPPLY — 67 items
ADH SKN CLS APL DERMABOND .7 (GAUZE/BANDAGES/DRESSINGS) ×2
APL PRP STRL LF DISP 70% ISPRP (MISCELLANEOUS) ×1
APPLIER CLIP 11 MED OPEN (CLIP)
APPLIER CLIP 9.375 SM OPEN (CLIP)
APR CLP MED 11 20 MLT OPN (CLIP)
APR CLP SM 9.3 20 MLT OPN (CLIP)
BAG DECANTER FOR FLEXI CONT (MISCELLANEOUS) ×1 IMPLANT
BLADE SURG 15 STRL LF DISP TIS (BLADE) ×1 IMPLANT
BLADE SURG 15 STRL SS (BLADE) ×2
BLADE SURG SZ11 CARB STEEL (BLADE) ×1 IMPLANT
BOOT SUTURE AID YELLOW STND (SUTURE) ×2 IMPLANT
BRUSH SCRUB EZ  4% CHG (MISCELLANEOUS) ×2
BRUSH SCRUB EZ 4% CHG (MISCELLANEOUS) ×1 IMPLANT
CANISTER SUCT 1200ML W/VALVE (MISCELLANEOUS) ×2 IMPLANT
CHLORAPREP W/TINT 26 (MISCELLANEOUS) ×2 IMPLANT
CLIP APPLIE 11 MED OPEN (CLIP) IMPLANT
CLIP APPLIE 9.375 SM OPEN (CLIP) IMPLANT
CLIP SPRNG 6 S-JAW DBL (CLIP) IMPLANT
CLIP SPRNG 6MM S-JAW DBL (CLIP) ×2
COVER WAND RF STERILE (DRAPES) ×2 IMPLANT
DECANTER SPIKE VIAL GLASS SM (MISCELLANEOUS) ×1 IMPLANT
DERMABOND ADVANCED (GAUZE/BANDAGES/DRESSINGS) ×2
DERMABOND ADVANCED .7 DNX12 (GAUZE/BANDAGES/DRESSINGS) ×1 IMPLANT
ELECT CAUTERY BLADE 6.4 (BLADE) ×2 IMPLANT
ELECT REM PT RETURN 9FT ADLT (ELECTROSURGICAL) ×2
ELECTRODE REM PT RTRN 9FT ADLT (ELECTROSURGICAL) ×1 IMPLANT
GLOVE BIO SURGEON STRL SZ7 (GLOVE) ×3 IMPLANT
GLOVE INDICATOR 7.5 STRL GRN (GLOVE) ×3 IMPLANT
GLOVE SURG SYN 8.0 (GLOVE) ×4 IMPLANT
GLOVE SURG SYN 8.0 PF PI (GLOVE) ×1 IMPLANT
GOWN L4 XLG 20 PK N/S (GOWN DISPOSABLE) ×2 IMPLANT
GOWN STRL REUS W/ TWL LRG LVL3 (GOWN DISPOSABLE) ×2 IMPLANT
GOWN STRL REUS W/TWL LRG LVL3 (GOWN DISPOSABLE) ×4
HEMOSTAT SURGICEL 2X3 (HEMOSTASIS) ×1 IMPLANT
IV NS 500ML (IV SOLUTION) ×2
IV NS 500ML BAXH (IV SOLUTION) ×1 IMPLANT
KIT TURNOVER KIT A (KITS) ×2 IMPLANT
LABEL OR SOLS (LABEL) ×1 IMPLANT
LOOP RED MAXI  1X406MM (MISCELLANEOUS) ×1
LOOP VESSEL MAXI  1X406 RED (MISCELLANEOUS) ×1
LOOP VESSEL MAXI 1X406 RED (MISCELLANEOUS) ×1 IMPLANT
LOOP VESSEL MINI 0.8X406 BLUE (MISCELLANEOUS) ×2 IMPLANT
LOOPS BLUE MINI 0.8X406MM (MISCELLANEOUS) ×1
NDL FILTER BLUNT 18X1 1/2 (NEEDLE) ×1 IMPLANT
NEEDLE FILTER BLUNT 18X 1/2SAF (NEEDLE) ×1
NEEDLE FILTER BLUNT 18X1 1/2 (NEEDLE) ×1 IMPLANT
PACK EXTREMITY (MISCELLANEOUS) ×2 IMPLANT
PAD PREP 24X41 OB/GYN DISP (PERSONAL CARE ITEMS) ×2 IMPLANT
SOLUTION CELL SAVER (CLIP) ×1 IMPLANT
STOCKINETTE 48X4 2 PLY STRL (GAUZE/BANDAGES/DRESSINGS) ×1 IMPLANT
STOCKINETTE STRL 4IN 9604848 (GAUZE/BANDAGES/DRESSINGS) ×2 IMPLANT
SUT MNCRL+ 5-0 UNDYED PC-3 (SUTURE) ×2 IMPLANT
SUT MONOCRYL 5-0 (SUTURE) ×2
SUT PROLENE 6 0 BV (SUTURE) ×6 IMPLANT
SUT SILK 2 0 (SUTURE) ×2
SUT SILK 2 0 SH (SUTURE) ×1 IMPLANT
SUT SILK 2-0 18XBRD TIE 12 (SUTURE) ×1 IMPLANT
SUT SILK 3 0 (SUTURE) ×2
SUT SILK 3-0 18XBRD TIE 12 (SUTURE) ×1 IMPLANT
SUT SILK 4 0 (SUTURE)
SUT SILK 4-0 18XBRD TIE 12 (SUTURE) ×1 IMPLANT
SUT VIC AB 2-0 SH 27 (SUTURE) ×2
SUT VIC AB 2-0 SH 27XBRD (SUTURE) IMPLANT
SUT VIC AB 3-0 SH 27 (SUTURE) ×2
SUT VIC AB 3-0 SH 27X BRD (SUTURE) ×3 IMPLANT
SYR 20ML LL LF (SYRINGE) ×1 IMPLANT
SYR 3ML LL SCALE MARK (SYRINGE) ×1 IMPLANT

## 2019-10-29 NOTE — Anesthesia Postprocedure Evaluation (Signed)
Anesthesia Post Note  Patient: Jennifer Newman  Procedure(s) Performed: FISTULA SUPERFICIALIZATION (Left )  Patient location during evaluation: PACU Anesthesia Type: General Level of consciousness: awake and alert Pain management: pain level controlled Vital Signs Assessment: post-procedure vital signs reviewed and stable Respiratory status: spontaneous breathing, nonlabored ventilation and respiratory function stable Cardiovascular status: blood pressure returned to baseline and stable Postop Assessment: no apparent nausea or vomiting Anesthetic complications: no     Last Vitals:  Vitals:   10/29/19 1807 10/29/19 1821  BP: (!) 128/95 (!) 141/93  Pulse: (!) 125 (!) 115  Resp: 16 18  Temp:  (!) 36.3 C  SpO2: 94% 94%    Last Pain:  Vitals:   10/29/19 1821  TempSrc: Temporal  PainSc: [place]

## 2019-10-29 NOTE — Transfer of Care (Signed)
Immediate Anesthesia Transfer of Care Note  Patient: Jennifer Newman  Procedure(s) Performed: FISTULA SUPERFICIALIZATION (Left )  Patient Location: PACU  Anesthesia Type:General  Level of Consciousness: awake, drowsy and patient cooperative  Airway & Oxygen Therapy: Patient Spontanous Breathing and Patient connected to face mask oxygen  Post-op Assessment: Report given to RN and Post -op Vital signs reviewed and stable  Post vital signs: Reviewed and stable  Last Vitals:  Vitals Value Taken Time  BP 145/96 10/29/19 1654  Temp    Pulse 130 10/29/19 1655  Resp 18 10/29/19 1655  SpO2 99 % 10/29/19 1655  Vitals shown include unvalidated device data.  Last Pain:  Vitals:   10/29/19 1124  TempSrc: Temporal  PainSc: 4          Complications: No apparent anesthesia complications

## 2019-10-29 NOTE — Op Note (Signed)
[facility]   OPERATIVE NOTE   PROCEDURE: 1. left brachiocephalic arteriovenous fistula superficialization  PRE-OPERATIVE DIAGNOSIS: 1. ESRD                      2.  Poorly-functional arteriovenous fistula   POST-OPERATIVE DIAGNOSIS: same as above   SURGEON: Leotis Pain, MD  ASSISTANT(S): Hezzie Bump, PA-C  ANESTHESIA: general  ESTIMATED BLOOD LOSS: 10 cc  FINDING(S): 1. Patent AV fistula which was fairly deep  SPECIMEN(S): None  INDICATIONS:   Jennifer Newman is a 54 y.o. female who presents with mature arteriovenous fistula that is > 6 mm deep.  It was felt that without superficialization of the arteriovenous fistula it could not be used for hemodialysis.  Risk, benefits, and alternatives to access surgery were discussed.  The patient is aware the risks include but are not limited to: bleeding, infection, steal syndrome, nerve damage, ischemic monomelic neuropathy, loss of the access, need for additional procedures, death and stroke.  The patient agrees to proceed forward with the procedure. An assistant was present during the procedure to help facilitate the exposure and expedite the procedure.  DESCRIPTION: After obtain full informed written consent, the patient was brought back to the operating room and placed supine upon the operating table.  The patient received IV antibiotics prior to induction.  After obtaining adequate anesthesia, the patient was prepped and draped in the standard fashion for: left arm access. The assistant provided retraction and mobilization to help facilitate exposure and expedite the procedure throughout the entire procedure.  This included following suture, using retractors, and optimizing lighting. I turned my attention to the patient's arm and existing access, where I could feel the arterial anastomosis.  I made an incision from the arterial anastomosis up to the proximal upper arm extent of the vein.  Using electrocautery and  blunt dissection, I developed a plane down the vein.  I tied off side branches with silk ties prior to transecting them.  In such fashion, I fully mobilized the vein from the arterial anastomosis up to its proximal upper arm extent.  The vein was noted to be 6-10 mm in diameter externally.  The arterial anastomosis was noted to be widely patent.  I then brought the fistula near the skin, closing the deep layers of tissue under the fistula with interrupted 2-0 Vicryl sutures.  The subcutaneous tissue was reapproximated with a running stitch of 3-0 Vicryl.  The skin was reapproximated with a running subcuticular of 4-0 Monocryl.  The skin was cleaned, dried, and reinforced with Dermabond.  COMPLICATIONS: none  CONDITION: stable  Leotis Pain  10/29/2019, 4:36 PM   This note was created with Dragon Medical transcription system. Any errors in dictation are purely unintentional.

## 2019-10-29 NOTE — Anesthesia Procedure Notes (Signed)
Procedure Name: Intubation Date/Time: 10/29/2019 3:45 PM Performed by: Jerrye Noble, CRNA Pre-anesthesia Checklist: Patient identified, Emergency Drugs available, Suction available and Patient being monitored Patient Re-evaluated:Patient Re-evaluated prior to induction Oxygen Delivery Method: Circle system utilized Preoxygenation: Pre-oxygenation with 100% oxygen Induction Type: IV induction Ventilation: Two handed mask ventilation required and Oral airway inserted - appropriate to patient size Laryngoscope Size: McGraph and 3 Grade View: Grade III Tube type: Oral Tube size: 7.0 mm Number of attempts: 1 Airway Equipment and Method: Stylet,  Oral airway and Video-laryngoscopy Placement Confirmation: ETT inserted through vocal cords under direct vision,  positive ETCO2 and breath sounds checked- equal and bilateral Secured at: 22 cm Tube secured with: Tape Dental Injury: Teeth and Oropharynx as per pre-operative assessment

## 2019-10-29 NOTE — Discharge Instructions (Signed)

## 2019-10-29 NOTE — H&P (Signed)
[facility] History & Physical Update  The patient was interviewed and re-examined.  The patient's previous History and Physical has been reviewed and is unchanged.  There is no change in the plan of care. We plan to proceed with the scheduled procedure.  Leotis Pain, MD  10/29/2019, 3:08 PM

## 2019-10-29 NOTE — Anesthesia Preprocedure Evaluation (Signed)
Anesthesia Evaluation  Patient identified by MRN, date of birth, ID band Patient awake    Reviewed: Allergy & Precautions, H&P , NPO status , Patient's Chart, lab work & pertinent test results  History of Anesthesia Complications Negative for: history of anesthetic complications  Airway Mallampati: III  TM Distance: <3 FB Neck ROM: full    Dental  (+) Chipped, Poor Dentition   Pulmonary neg pulmonary ROS, neg shortness of breath,           Cardiovascular Exercise Tolerance: Good hypertension, (-) angina(-) Past MI and (-) DOE + dysrhythmias      Neuro/Psych  Headaches, PSYCHIATRIC DISORDERS    GI/Hepatic Neg liver ROS, GERD  Medicated and Controlled,  Endo/Other  negative endocrine ROS  Renal/GU DialysisRenal disease  negative genitourinary   Musculoskeletal   Abdominal   Peds  Hematology negative hematology ROS (+)   Anesthesia Other Findings Past Medical History: No date: Anxiety No date: Bipolar disorder ([place]) No date: Chronic kidney disease No date: Depression No date: Dysrhythmia     Comment:  tachycardia No date: GERD (gastroesophageal reflux disease) No date: Hypertension No date: Obsessive-compulsive disorder No date: PTSD (post-traumatic stress disorder) No date: Tachycardia, unspecified No date: Vertigo  Past Surgical History: 08/22/2018: DIALYSIS/PERMA CATHETER INSERTION; N/A     Comment:  Procedure: DIALYSIS temporary catheter INSERTION;                Surgeon: Algernon Huxley, MD;  Location: [facility];  Service: Cardiovascular;  Laterality: N/A; 08/26/2018: DIALYSIS/PERMA CATHETER INSERTION; N/A     Comment:  Procedure: DIALYSIS/PERMA CATHETER INSERTION;  Surgeon:               Algernon Huxley, MD;  Location: [facility];                Service: Cardiovascular;  Laterality: N/A; 01/01/2019: DIALYSIS/PERMA CATHETER INSERTION; N/A     Comment:  Procedure:  DIALYSIS/PERMA CATHETER INSERTION;  Surgeon:               Algernon Huxley, MD;  Location: [facility];                Service: Cardiovascular;  Laterality: N/A; No date: none  BMI    Body Mass Index: 37.49 kg/m      Reproductive/Obstetrics negative OB ROS                             Anesthesia Physical  Anesthesia Plan  ASA: IV  Anesthesia Plan: General   Post-op Pain Management:    Induction: Intravenous  PONV Risk Score and Plan: Ondansetron, Dexamethasone, Midazolam and Treatment may vary due to age or medical condition  Airway Management Planned: Oral ETT and Video Laryngoscope Planned  Additional Equipment:   Intra-op Plan:   Post-operative Plan: Extubation in OR  Informed Consent: I have reviewed the patients History and Physical, chart, labs and discussed the procedure including the risks, benefits and alternatives for the proposed anesthesia with the patient or authorized representative who has indicated his/her understanding and acceptance.     Dental Advisory Given  Plan Discussed with: Anesthesiologist, CRNA and Surgeon  Anesthesia Plan Comments: (Patient consented for risks of anesthesia including but not limited to:  - adverse reactions to medications - damage to eyes, teeth, lips or other oral mucosa -  nerve damage due to positioning  - sore throat or hoarseness - Damage to heart, brain, nerves, lungs or loss of life  Patient voiced understanding.)        Anesthesia Quick Evaluation

## 2019-11-27 ENCOUNTER — Other Ambulatory Visit: Payer: Self-pay | Admitting: Acute Care

## 2019-11-27 DIAGNOSIS — I639 Cerebral infarction, unspecified: Secondary | ICD-10-CM

## 2019-12-04 DIAGNOSIS — R232 Flushing: Secondary | ICD-10-CM | POA: Insufficient documentation

## 2019-12-12 ENCOUNTER — Ambulatory Visit
Admission: RE | Admit: 2019-12-12 | Discharge: 2019-12-12 | Disposition: A | Discharge status: Home / Self Care | Payer: BC Managed Care – PPO | Source: Ambulatory Visit | Attending: Acute Care | Admitting: Acute Care

## 2019-12-12 ENCOUNTER — Ambulatory Visit (INDEPENDENT_AMBULATORY_CARE_PROVIDER_SITE_OTHER): Payer: Medicare Other | Admitting: Vascular Surgery

## 2019-12-12 ENCOUNTER — Other Ambulatory Visit: Payer: Self-pay

## 2019-12-12 ENCOUNTER — Encounter (INDEPENDENT_AMBULATORY_CARE_PROVIDER_SITE_OTHER): Payer: Self-pay | Admitting: Vascular Surgery

## 2019-12-12 VITALS — BP 152/83 | HR 67 | Resp 16 | Wt 212.6 lb

## 2019-12-12 DIAGNOSIS — Z992 Dependence on renal dialysis: Secondary | ICD-10-CM

## 2019-12-12 DIAGNOSIS — N186 End stage renal disease: Secondary | ICD-10-CM

## 2019-12-12 DIAGNOSIS — I639 Cerebral infarction, unspecified: Secondary | ICD-10-CM | POA: Insufficient documentation

## 2019-12-12 NOTE — Progress Notes (Signed)
Patient ID: Jennifer Newman, female   DOB: 1965-10-13, 54 y.o.   MRN: [id number]  Chief Complaint  Patient presents with  . Follow-up    [facility] follow up    HPI Jennifer Newman is a 54 y.o. female.  Patient returns in follow-up few weeks after superficial cessation of the left brachiocephalic AV fistula to improve its function.  The wound has healed well.  There is an excellent thrill in the AV fistula.  Her arm is doing well and she is not having any pain or trouble.  She is still using her catheter but is excited to start using the fistula in hopes of getting the catheter out   Past Medical History:  Diagnosis Date  . Anxiety   . Bipolar disorder ([place])   . Chronic kidney disease   . Depression   . Dysrhythmia    tachycardia  . GERD (gastroesophageal reflux disease)   . Hypertension   . Obsessive-compulsive disorder   . PTSD (post-traumatic stress disorder)   . Tachycardia, unspecified   . Vertigo     Past Surgical History:  Procedure Laterality Date  . A/V FISTULAGRAM Left 06/30/2019   Procedure: A/V FISTULAGRAM;  Surgeon: Algernon Huxley, MD;  Location: [facility];  Service: Cardiovascular;  Laterality: Left;  . AV FISTULA PLACEMENT Left 02/26/2019   Procedure: ARTERIOVENOUS (AV) FISTULA CREATION ( RADIAL CEPHALIC );  Surgeon: Algernon Huxley, MD;  Location: [facility];  Service: Vascular;  Laterality: Left;  . AV FISTULA PLACEMENT Left 04/16/2019   Procedure: ARTERIOVENOUS (AV) FISTULA CREATION ( BRACHIAL CEPHALIC );  Surgeon: Algernon Huxley, MD;  Location: [facility];  Service: Vascular;  Laterality: Left;  . DIALYSIS/PERMA CATHETER INSERTION N/A 08/22/2018   Procedure: DIALYSIS temporary catheter INSERTION;  Surgeon: Algernon Huxley, MD;  Location: [facility];  Service: Cardiovascular;  Laterality: N/A;  . DIALYSIS/PERMA CATHETER INSERTION N/A 08/26/2018   Procedure: DIALYSIS/PERMA CATHETER INSERTION;  Surgeon: Algernon Huxley, MD;  Location: [facility];   Service: Cardiovascular;  Laterality: N/A;  . DIALYSIS/PERMA CATHETER INSERTION N/A 01/01/2019   Procedure: DIALYSIS/PERMA CATHETER INSERTION;  Surgeon: Algernon Huxley, MD;  Location: [facility];  Service: Cardiovascular;  Laterality: N/A;  . FISTULA SUPERFICIALIZATION Left 10/29/2019   Procedure: FISTULA SUPERFICIALIZATION;  Surgeon: Algernon Huxley, MD;  Location: [facility];  Service: Vascular;  Laterality: Left;  . none    . PERIPHERAL VASCULAR THROMBECTOMY Left 03/12/2019   Procedure: PERIPHERAL VASCULAR THROMBECTOMY;  Surgeon: Algernon Huxley, MD;  Location: [facility];  Service: Cardiovascular;  Laterality: Left;      No Known Allergies  Current Outpatient Medications  Medication Sig Dispense Refill  . butalbital-aspirin-caffeine (FIORINAL) 50-325-40 MG capsule Take 1 capsule by mouth 2 (two) times daily as needed for headache (for migraine).    . clonazePAM (KLONOPIN) 0.5 MG tablet Take 1 tablet by mouth daily as needed.     . diltiazem (CARDIZEM SR) 60 MG 12 hr capsule Take 60 mg by mouth 2 (two) times daily.    . famotidine (PEPCID) 10 MG tablet Take 10 mg by mouth daily as needed for heartburn or indigestion.    . ferric citrate (AURYXIA) 1 GM 210 MG(Fe) tablet Take 420 mg by mouth 3 (three) times daily with meals.    . fluticasone (FLONASE) 50 MCG/ACT nasal spray Place 1 spray into both nostrils 2 (two) times daily as needed.     Marland Kitchen  HYDROcodone-acetaminophen (NORCO) 5-325 MG tablet Take 1 tablet by mouth every 6 (six) hours as needed for moderate pain. 30 tablet 0  . iron sucrose in sodium chloride 0.9 % 100 mL Iron Sucrose (Venofer)    . lidocaine-prilocaine (EMLA) cream PLEASE SEE ATTACHED FOR DETAILED DIRECTIONS    . losartan (COZAAR) 25 MG tablet Take 25 mg by mouth daily.    . Methoxy PEG-Epoetin Beta (MIRCERA IJ) Mircera    . nortriptyline (PAMELOR) 50 MG capsule Take 50 mg by mouth at bedtime.    . ondansetron (ZOFRAN) 4 MG tablet Take 4 mg by mouth every 8 (eight)  hours as needed.     . sertraline (ZOLOFT) 25 MG tablet Take 1 tablet by mouth 1 day or 1 dose.     No current facility-administered medications for this visit.        Physical Exam BP (!) 152/83 (BP Location: Right Arm)   Pulse 67   Resp 16   Wt 212 lb 9.6 oz (96.4 kg)   LMP 08/31/2018   BMI 37.66 kg/m  Gen:  WD/WN, NAD Skin: incision C/D/I     Assessment/Plan:  ESRD on dialysis ([place]) At this point, her wound has healed and her fistula can be used for dialysis next week.  I would start using it on 6/14 or 6/15.  Once this has been used for several treatments, we can get her PermCath out.  I will plan to recheck her in about 3 months with a duplex.      Leotis Pain 12/12/2019, 10:01 AM   This note was created with Dragon medical transcription system.  Any errors from dictation are unintentional.

## 2019-12-12 NOTE — Assessment & Plan Note (Signed)
At this point, her wound has healed and her fistula can be used for dialysis next week.  I would start using it on 6/14 or 6/15.  Once this has been used for several treatments, we can get her PermCath out.  I will plan to recheck her in about 3 months with a duplex.

## 2019-12-24 ENCOUNTER — Telehealth (INDEPENDENT_AMBULATORY_CARE_PROVIDER_SITE_OTHER): Payer: Self-pay

## 2019-12-24 NOTE — Telephone Encounter (Signed)
A fax was received from [facility] at [facility] wanting the patient to have a permcath removal. Patient is scheduled with Dr. Lucky Cowboy on 12/29/19 with a 11:45 am arrival to the [facility]. Patient will do covid testing on 12/26/19 between 8-1 pm at the [facility]. Pre-procedure instructions will be faxed to [facility] at [facility].

## 2019-12-25 ENCOUNTER — Other Ambulatory Visit: Payer: Self-pay

## 2019-12-25 ENCOUNTER — Other Ambulatory Visit
Admission: RE | Admit: 2019-12-25 | Discharge: 2019-12-25 | Disposition: A | Discharge status: Home / Self Care | Payer: BC Managed Care – PPO | Source: Ambulatory Visit | Attending: Vascular Surgery | Admitting: Vascular Surgery

## 2019-12-25 DIAGNOSIS — Z20822 Contact with and (suspected) exposure to covid-19: Secondary | ICD-10-CM | POA: Diagnosis not present

## 2019-12-25 DIAGNOSIS — Z01812 Encounter for preprocedural laboratory examination: Secondary | ICD-10-CM | POA: Diagnosis not present

## 2019-12-25 LAB — SARS CORONAVIRUS 2 (TAT 6-24 HRS): SARS Coronavirus 2: NEGATIVE

## 2019-12-28 ENCOUNTER — Other Ambulatory Visit (INDEPENDENT_AMBULATORY_CARE_PROVIDER_SITE_OTHER): Payer: Self-pay | Admitting: Nurse Practitioner

## 2019-12-29 ENCOUNTER — Encounter: Payer: Self-pay | Admitting: Vascular Surgery

## 2019-12-29 ENCOUNTER — Other Ambulatory Visit: Payer: Self-pay

## 2019-12-29 ENCOUNTER — Encounter
Admission: RE | Disposition: A | Discharge status: Home / Self Care | Payer: Self-pay | Source: Home / Self Care | Attending: Vascular Surgery

## 2019-12-29 ENCOUNTER — Ambulatory Visit
Admission: RE | Admit: 2019-12-29 | Discharge: 2019-12-29 | Disposition: A | Discharge status: Home / Self Care | Payer: BC Managed Care – PPO | Attending: Vascular Surgery | Admitting: Vascular Surgery

## 2019-12-29 DIAGNOSIS — Z4901 Encounter for fitting and adjustment of extracorporeal dialysis catheter: Secondary | ICD-10-CM | POA: Diagnosis not present

## 2019-12-29 DIAGNOSIS — Z992 Dependence on renal dialysis: Secondary | ICD-10-CM | POA: Diagnosis not present

## 2019-12-29 DIAGNOSIS — N186 End stage renal disease: Secondary | ICD-10-CM | POA: Diagnosis not present

## 2019-12-29 HISTORY — PX: DIALYSIS/PERMA CATHETER REMOVAL: CATH118289

## 2019-12-29 SURGERY — DIALYSIS/PERMA CATHETER REMOVAL
Anesthesia: LOCAL

## 2019-12-29 MED ORDER — LIDOCAINE-EPINEPHRINE (PF) 1 %-1:200000 IJ SOLN
INTRAMUSCULAR | Status: DC | PRN
  Administered 2019-12-29: 20 mL

## 2019-12-29 SURGICAL SUPPLY — 5 items
APL PRP STRL LF DISP 70% ISPRP (MISCELLANEOUS) ×1
CHLORAPREP W/TINT 26 (MISCELLANEOUS) ×1 IMPLANT
FORCEPS HALSTEAD CVD 5IN STRL (INSTRUMENTS) ×1 IMPLANT
SCALPEL PROTECTED #11 DISP (BLADE) ×1 IMPLANT
TRAY LACERAT/PLASTIC (MISCELLANEOUS) ×1 IMPLANT

## 2019-12-29 NOTE — Op Note (Signed)
Operative Note  Preoperative diagnosis:    1. ESRD with functional permanent access  Postoperative diagnosis:   1. ESRD with functional permanent access  Procedure:  Removal of RIGHT Permcath  Performing Clinician: Hezzie Bump, PA-C  Supervising Surgeon:  Leotis Pain, MD  Anesthesia:  Local  EBL:  Minimal  Indication for the Procedure:  The patient has a functional permanent dialysis access and no longer needs their permcath.  This can be removed.  Risks and benefits are discussed and informed consent is obtained.  Description of the Procedure:  The patient's RIGHT neck, chest and existing catheter were sterilely prepped and draped. The area around the catheter was anesthetized copiously with 1% lidocaine. The catheter was dissected out with curved hemostats until the cuff was freed from the surrounding fibrous sheath. The fiber sheath was transected, and the catheter was then removed in its entirety using gentle traction. Pressure was held and sterile dressings were placed. The patient tolerated the procedure well and was taken to the recovery room in stable condition.  Discussed with Dr. Ellis Parents [facility]  12/29/2019, 1:13 PM  This note was created with Dragon Medical transcription system. Any errors in dictation are purely unintentional.

## 2019-12-29 NOTE — Discharge Instructions (Signed)
Tunneled Catheter Removal, Care After Refer to this sheet in the next few weeks. These instructions provide you with information about caring for yourself after your procedure. Your health care provider may also give you more specific instructions. Your treatment has been planned according to current medical practices, but problems sometimes occur. Call your health care provider if you have any problems or questions after your procedure. What can I expect after the procedure? After the procedure, it is common to have: Some mild redness, swelling, and pain around your catheter site.   Follow these instructions at home: Incision care  Check your removal site  every day for signs of infection. Check for: More redness, swelling, or pain. More fluid or blood. Warmth. Pus or a bad smell. Remove your dressing in 48hrs leave open to air  Activity  Return to your normal activities as told by your health care provider. Ask your health care provider what activities are safe for you. Do not lift anything that is heavier than 10 lb (4.5 kg) for 3 days  You may shower tomorrow  Contact a health care provider if: You have more fluid or blood coming from your removal site You have more redness, swelling, or pain at your incisions or around the area where your catheter was removed Your removal site feel warm to the touch. You feel unusually weak. You feel nauseous.. Get help right away if You have swelling in your arm, shoulder, neck, or face. You develop chest pain. You have difficulty breathing. You feel dizzy or light-headed. You have pus or a bad smell coming from your removal site You have a fever. You develop bleeding from your removal site, and your bleeding does not stop. This information is not intended to replace advice given to you by your health care provider. Make sure you discuss any questions you have with your health care provider. Document Released: 06/05/2012 Document Revised:  02/20/2016 Document Reviewed: 03/15/2015 Elsevier Interactive Patient Education  2017 [employer] 

## 2019-12-29 NOTE — H&P (Signed)
[facility] History & Physical Update  The patient was interviewed and re-examined.  The patient's previous History and Physical has been reviewed and is unchanged.  There is no change in the plan of care.  [place], PA-C  12/29/2019, 1:11 PM

## 2020-02-27 ENCOUNTER — Ambulatory Visit (INDEPENDENT_AMBULATORY_CARE_PROVIDER_SITE_OTHER): Payer: BC Managed Care – PPO | Admitting: Vascular Surgery

## 2020-02-27 ENCOUNTER — Encounter (INDEPENDENT_AMBULATORY_CARE_PROVIDER_SITE_OTHER): Payer: BC Managed Care – PPO

## 2020-03-12 ENCOUNTER — Ambulatory Visit (INDEPENDENT_AMBULATORY_CARE_PROVIDER_SITE_OTHER): Payer: BC Managed Care – PPO | Admitting: Nurse Practitioner

## 2020-03-12 ENCOUNTER — Ambulatory Visit (INDEPENDENT_AMBULATORY_CARE_PROVIDER_SITE_OTHER): Payer: Medicare Other | Admitting: Vascular Surgery

## 2020-03-12 ENCOUNTER — Ambulatory Visit (INDEPENDENT_AMBULATORY_CARE_PROVIDER_SITE_OTHER): Payer: BC Managed Care – PPO

## 2020-03-12 ENCOUNTER — Other Ambulatory Visit: Payer: Self-pay

## 2020-03-12 ENCOUNTER — Encounter (INDEPENDENT_AMBULATORY_CARE_PROVIDER_SITE_OTHER): Payer: Self-pay | Admitting: Nurse Practitioner

## 2020-03-12 VITALS — BP 102/54 | HR 68 | Resp 16 | Wt 216.8 lb

## 2020-03-12 DIAGNOSIS — N186 End stage renal disease: Secondary | ICD-10-CM | POA: Diagnosis not present

## 2020-03-12 DIAGNOSIS — E785 Hyperlipidemia, unspecified: Secondary | ICD-10-CM

## 2020-03-12 DIAGNOSIS — Z992 Dependence on renal dialysis: Secondary | ICD-10-CM

## 2020-03-12 DIAGNOSIS — I10 Essential (primary) hypertension: Secondary | ICD-10-CM

## 2020-03-17 ENCOUNTER — Encounter (INDEPENDENT_AMBULATORY_CARE_PROVIDER_SITE_OTHER): Payer: Self-pay | Admitting: Nurse Practitioner

## 2020-03-17 NOTE — Progress Notes (Signed)
Subjective:    Patient ID: Jennifer Newman, female    DOB: [birth year], 54 y.o.   MRN: [id number] Chief Complaint  Patient presents with  . Follow-up    abnormal HDA    The patient returns to the office for follow up regarding problem with the dialysis access. Currently the patient is maintained via a left brachiocephalic AV fistula.  The patient has had multiple failed upper extremity accesses.  The patient notes that her dialysis access had been performing fairly well until recently she has noticed that they have been pulling clots on a frequent basis.    Her arm was recently infiltrated.  The patient denies hand pain or other symptoms consistent with steal phenomena.  No significant arm swelling.  The patient denies redness or swelling at the access site. The patient denies fever or chills at home or while on dialysis.  The patient denies amaurosis fugax or recent TIA symptoms. There are no recent neurological changes noted. The patient denies claudication symptoms or rest pain symptoms. The patient denies history of DVT, PE or superficial thrombophlebitis. The patient denies recent episodes of angina or shortness of breath.   Today noninvasive studies show flow volume of 2454.  There are increase in velocities seen at the takeoff of the AV fistula in comparison to the inflow artery velocity.  There also appears to be some increased velocities in the mid to distal upper arm due to a change in diameter.  Ultrasound also shows evidence of thrombus within the proximal upper arm.    Review of Systems  Hematological: Bruises/bleeds easily.  All other systems reviewed and are negative.      Objective:   Physical Exam Vitals reviewed.  HENT:     Head: Normocephalic.  Cardiovascular:     Rate and Rhythm: Normal rate and regular rhythm.     Pulses: Normal pulses.     Heart sounds: Normal heart sounds.     Arteriovenous access: left arteriovenous access is present.    Comments:  Left brachiocephalic AV fistula with slightly dampened thrill and good bruit Pulmonary:     Effort: Pulmonary effort is normal.  Skin:    General: Skin is warm and dry.  Neurological:     Mental Status: She is alert and oriented to person, place, and time.  Psychiatric:        Mood and Affect: Mood normal.        Behavior: Behavior normal.        Thought Content: Thought content normal.        Judgment: Judgment normal.     BP (!) 102/54 (BP Location: Right Arm)   Pulse 68   Resp 16   Wt 216 lb 12.8 oz (98.3 kg)   LMP 08/31/2018   BMI 38.40 kg/m   Past Medical History:  Diagnosis Date  . Anxiety   . Bipolar disorder ([place])   . Chronic kidney disease   . Depression   . Dysrhythmia    tachycardia  . GERD (gastroesophageal reflux disease)   . Hypertension   . Obsessive-compulsive disorder   . PTSD (post-traumatic stress disorder)   . Tachycardia, unspecified   . Vertigo     Social History   Socioeconomic History  . Marital status: Married    Spouse name: Corene Cornea  . Number of children: 2  . Years of education: Not on file  . Highest education level: Associate degree: occupational, Hotel manager, or vocational program  Occupational History  . 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 Smoking status: Never Smoker  . Smokeless tobacco: Never Used  Vaping Use  . Vaping Use: Never used  Substance and Sexual Activity  . Alcohol use: Not Currently    Alcohol/week: 0.0 standard drinks  . Drug use: Not Currently    Types: Marijuana    Comment: > 72yrs  . Sexual activity: Not Currently  Other Topics Concern  . Not on file  Social History Narrative  . Not on file   Social Determinants of Health   Financial Resource Strain:   . Difficulty of Paying Living Expenses: Not on file  Food Insecurity:   . Worried About Charity fundraiser in the Last Year: Not on file  . Ran Out of Food in the Last Year: Not on file    Transportation Needs:   . Lack of Transportation (Medical): Not on file  . Lack of Transportation (Non-Medical): Not on file  Physical Activity:   . Days of Exercise per Week: Not on file  . Minutes of Exercise per Session: Not on file  Stress:   . Feeling of Stress : Not on file  Social Connections:   . Frequency of Communication with Friends and Family: Not on file  . Frequency of Social Gatherings with Friends and Family: Not on file  . Attends Religious Services: Not on file  . Active Member of Clubs or Organizations: Not on file  . Attends Archivist Meetings: Not on file  . Marital Status: Not on file  Intimate Partner Violence:   . Fear of Current or Ex-Partner: Not on file  . Emotionally Abused: Not on file  . Physically Abused: Not on file  . Sexually Abused: Not on file    Past Surgical History:  Procedure Laterality Date  . A/V FISTULAGRAM Left 06/30/2019   Procedure: A/V FISTULAGRAM;  Surgeon: Algernon Huxley, MD;  Location: [place] [facility];  Service: Cardiovascular;  Laterality: Left;  . AV FISTULA PLACEMENT Left 02/26/2019   Procedure: ARTERIOVENOUS (AV) FISTULA CREATION ( RADIAL CEPHALIC );  Surgeon: Algernon Huxley, MD;  Location: [facility];  Service: Vascular;  Laterality: Left;  . AV FISTULA PLACEMENT Left 04/16/2019   Procedure: ARTERIOVENOUS (AV) FISTULA CREATION ( BRACHIAL CEPHALIC );  Surgeon: Algernon Huxley, MD;  Location: [facility];  Service: Vascular;  Laterality: Left;  . DIALYSIS/PERMA CATHETER INSERTION N/A 08/22/2018   Procedure: DIALYSIS temporary catheter INSERTION;  Surgeon: Algernon Huxley, MD;  Location: [place] [facility];  Service: Cardiovascular;  Laterality: N/A;  . DIALYSIS/PERMA CATHETER INSERTION N/A 08/26/2018   Procedure: DIALYSIS/PERMA CATHETER INSERTION;  Surgeon: Algernon Huxley, MD;  Location: [place] [facility];  Service: Cardiovascular;  Laterality: N/A;  . DIALYSIS/PERMA CATHETER INSERTION N/A 01/01/2019   Procedure: DIALYSIS/PERMA  CATHETER INSERTION;  Surgeon: Algernon Huxley, MD;  Location: [place] [facility];  Service: Cardiovascular;  Laterality: N/A;  . DIALYSIS/PERMA CATHETER REMOVAL N/A 12/29/2019   Procedure: DIALYSIS/PERMA CATHETER REMOVAL;  Surgeon: Algernon Huxley, MD;  Location: [place] [facility];  Service: Cardiovascular;  Laterality: N/A;  . FISTULA SUPERFICIALIZATION Left 10/29/2019   Procedure: FISTULA SUPERFICIALIZATION;  Surgeon: Algernon Huxley, MD;  Location: [facility];  Service: Vascular;  Laterality: Left;  . none    . PERIPHERAL VASCULAR THROMBECTOMY Left 03/12/2019   Procedure: PERIPHERAL VASCULAR THROMBECTOMY;  Surgeon: Algernon Huxley, MD;  Location: [place] [facility];  Service: Cardiovascular;  Laterality: Left;    Family History  Problem Relation Age of Onset  . COPD Neg Hx   . Diabetes Neg Hx   . Hypertension Neg Hx   . CAD Neg Hx     No Known Allergies     Assessment & Plan:   1. ESRD on dialysis [facility]) Recommend:  The patient is experiencing increasing problems with their dialysis access.  Patient should have a fistulagram with the intention for intervention.  The intention for intervention is to restore appropriate flow and prevent thrombosis and possible loss of the access.  As well as improve the quality of dialysis therapy.  The risks, benefits and alternative therapies were reviewed in detail with the patient.  All questions were answered.  The patient agrees to proceed with angio/intervention.      2. Essential hypertension Continue antihypertensive medications as already ordered, these medications have been reviewed and there are no changes at this time.   3. Hyperlipidemia, unspecified hyperlipidemia type Continue statin as ordered and reviewed, no changes at this time    Current Outpatient Medications on File Prior to Visit  Medication Sig Dispense Refill  . clonazePAM (KLONOPIN) 0.5 MG tablet Take 1 tablet by mouth daily as needed.     . diltiazem (CARDIZEM SR) 60 MG  12 hr capsule Take 60 mg by mouth 2 (two) times daily.    . famotidine (PEPCID) 10 MG tablet Take 10 mg by mouth daily as needed for heartburn or indigestion.    . ferric citrate (AURYXIA) 1 GM 210 MG(Fe) tablet Take 420 mg by mouth 3 (three) times daily with meals.    . fluticasone (FLONASE) 50 MCG/ACT nasal spray Place 1 spray into both nostrils 2 (two) times daily as needed.     . iron sucrose in sodium chloride 0.9 % 100 mL Iron Sucrose (Venofer)    . losartan (COZAAR) 25 MG tablet Take 25 mg by mouth daily.    . Methoxy PEG-Epoetin Beta (MIRCERA IJ) Mircera    . ondansetron (ZOFRAN) 4 MG tablet Take 4 mg by mouth every 8 (eight) hours as needed.     . butalbital-aspirin-caffeine (FIORINAL) 50-325-40 MG capsule Take 1 capsule by mouth 2 (two) times daily as needed for headache (for migraine). (Patient not taking: Reported on 03/12/2020)    . HYDROcodone-acetaminophen (NORCO) 5-325 MG tablet Take 1 tablet by mouth every 6 (six) hours as needed for moderate pain. (Patient not taking: Reported on 03/12/2020) 30 tablet 0  . lidocaine-prilocaine (EMLA) cream PLEASE SEE ATTACHED FOR DETAILED DIRECTIONS (Patient not taking: Reported on 03/12/2020)    . nortriptyline (PAMELOR) 50 MG capsule Take 50 mg by mouth at bedtime.    . sertraline (ZOLOFT) 25 MG tablet Take 1 tablet by mouth 1 day or 1 dose.     No current facility-administered medications on file prior to visit.    There are no Patient Instructions on file for this visit. No follow-ups on file.   Kris Hartmann, NP

## 2020-03-17 NOTE — H&P (View-Only) (Signed)
Subjective:    Patient ID: Jennifer Newman, female    DOB: 1965/12/04, 54 y.o.   MRN: [id number] Chief Complaint  Patient presents with  . Follow-up    abnormal HDA    The patient returns to the office for follow up regarding problem with the dialysis access. Currently the patient is maintained via a left brachiocephalic AV fistula.  The patient has had multiple failed upper extremity accesses.  The patient notes that her dialysis access had been performing fairly well until recently she has noticed that they have been pulling clots on a frequent basis.    Her arm was recently infiltrated.  The patient denies hand pain or other symptoms consistent with steal phenomena.  No significant arm swelling.  The patient denies redness or swelling at the access site. The patient denies fever or chills at home or while on dialysis.  The patient denies amaurosis fugax or recent TIA symptoms. There are no recent neurological changes noted. The patient denies claudication symptoms or rest pain symptoms. The patient denies history of DVT, PE or superficial thrombophlebitis. The patient denies recent episodes of angina or shortness of breath.   Today noninvasive studies show flow volume of 2454.  There are increase in velocities seen at the takeoff of the AV fistula in comparison to the inflow artery velocity.  There also appears to be some increased velocities in the mid to distal upper arm due to a change in diameter.  Ultrasound also shows evidence of thrombus within the proximal upper arm.    Review of Systems  Hematological: Bruises/bleeds easily.  All other systems reviewed and are negative.      Objective:   Physical Exam Vitals reviewed.  HENT:     Head: Normocephalic.  Cardiovascular:     Rate and Rhythm: Normal rate and regular rhythm.     Pulses: Normal pulses.     Heart sounds: Normal heart sounds.     Arteriovenous access: left arteriovenous access is present.    Comments:  Left brachiocephalic AV fistula with slightly dampened thrill and good bruit Pulmonary:     Effort: Pulmonary effort is normal.  Skin:    General: Skin is warm and dry.  Neurological:     Mental Status: She is alert and oriented to person, place, and time.  Psychiatric:        Mood and Affect: Mood normal.        Behavior: Behavior normal.        Thought Content: Thought content normal.        Judgment: Judgment normal.     BP (!) 102/54 (BP Location: Right Arm)   Pulse 68   Resp 16   Wt 216 lb 12.8 oz (98.3 kg)   LMP 08/31/2018   BMI 38.40 kg/m   Past Medical History:  Diagnosis Date  . Anxiety   . Bipolar disorder ([place])   . Chronic kidney disease   . Depression   . Dysrhythmia    tachycardia  . GERD (gastroesophageal reflux disease)   . Hypertension   . Obsessive-compulsive disorder   . PTSD (post-traumatic stress disorder)   . Tachycardia, unspecified   . Vertigo     Social History   Socioeconomic History  . Marital status: Married    Spouse name: Corene Cornea  . Number of children: 2  . Years of education: Not on file  . Highest education level: Associate degree: occupational, Hotel manager, or vocational program  Occupational History  . 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 Smoking status: Never Smoker  . Smokeless tobacco: Never Used  Vaping Use  . Vaping Use: Never used  Substance and Sexual Activity  . Alcohol use: Not Currently    Alcohol/week: 0.0 standard drinks  . Drug use: Not Currently    Types: Marijuana    Comment: > 64yrs  . Sexual activity: Not Currently  Other Topics Concern  . Not on file  Social History Narrative  . Not on file   Social Determinants of Health   Financial Resource Strain:   . Difficulty of Paying Living Expenses: Not on file  Food Insecurity:   . Worried About Charity fundraiser in the Last Year: Not on file  . Ran Out of Food in the Last Year: Not on file    Transportation Needs:   . Lack of Transportation (Medical): Not on file  . Lack of Transportation (Non-Medical): Not on file  Physical Activity:   . Days of Exercise per Week: Not on file  . Minutes of Exercise per Session: Not on file  Stress:   . Feeling of Stress : Not on file  Social Connections:   . Frequency of Communication with Friends and Family: Not on file  . Frequency of Social Gatherings with Friends and Family: Not on file  . Attends Religious Services: Not on file  . Active Member of Clubs or Organizations: Not on file  . Attends Archivist Meetings: Not on file  . Marital Status: Not on file  Intimate Partner Violence:   . Fear of Current or Ex-Partner: Not on file  . Emotionally Abused: Not on file  . Physically Abused: Not on file  . Sexually Abused: Not on file    Past Surgical History:  Procedure Laterality Date  . A/V FISTULAGRAM Left 06/30/2019   Procedure: A/V FISTULAGRAM;  Surgeon: Algernon Huxley, MD;  Location: [place] [facility];  Service: Cardiovascular;  Laterality: Left;  . AV FISTULA PLACEMENT Left 02/26/2019   Procedure: ARTERIOVENOUS (AV) FISTULA CREATION ( RADIAL CEPHALIC );  Surgeon: Algernon Huxley, MD;  Location: [facility];  Service: Vascular;  Laterality: Left;  . AV FISTULA PLACEMENT Left 04/16/2019   Procedure: ARTERIOVENOUS (AV) FISTULA CREATION ( BRACHIAL CEPHALIC );  Surgeon: Algernon Huxley, MD;  Location: [facility];  Service: Vascular;  Laterality: Left;  . DIALYSIS/PERMA CATHETER INSERTION N/A 08/22/2018   Procedure: DIALYSIS temporary catheter INSERTION;  Surgeon: Algernon Huxley, MD;  Location: [place] [facility];  Service: Cardiovascular;  Laterality: N/A;  . DIALYSIS/PERMA CATHETER INSERTION N/A 08/26/2018   Procedure: DIALYSIS/PERMA CATHETER INSERTION;  Surgeon: Algernon Huxley, MD;  Location: [place] [facility];  Service: Cardiovascular;  Laterality: N/A;  . DIALYSIS/PERMA CATHETER INSERTION N/A 01/01/2019   Procedure: DIALYSIS/PERMA  CATHETER INSERTION;  Surgeon: Algernon Huxley, MD;  Location: [place] [facility];  Service: Cardiovascular;  Laterality: N/A;  . DIALYSIS/PERMA CATHETER REMOVAL N/A 12/29/2019   Procedure: DIALYSIS/PERMA CATHETER REMOVAL;  Surgeon: Algernon Huxley, MD;  Location: [place] [facility];  Service: Cardiovascular;  Laterality: N/A;  . FISTULA SUPERFICIALIZATION Left 10/29/2019   Procedure: FISTULA SUPERFICIALIZATION;  Surgeon: Algernon Huxley, MD;  Location: [facility];  Service: Vascular;  Laterality: Left;  . none    . PERIPHERAL VASCULAR THROMBECTOMY Left 03/12/2019   Procedure: PERIPHERAL VASCULAR THROMBECTOMY;  Surgeon: Algernon Huxley, MD;  Location: [place] [facility];  Service: Cardiovascular;  Laterality: Left;    Family History  Problem Relation Age of Onset  . COPD Neg Hx   . Diabetes Neg Hx   . Hypertension Neg Hx   . CAD Neg Hx     No Known Allergies     Assessment & Plan:   1. ESRD on dialysis [facility]) Recommend:  The patient is experiencing increasing problems with their dialysis access.  Patient should have a fistulagram with the intention for intervention.  The intention for intervention is to restore appropriate flow and prevent thrombosis and possible loss of the access.  As well as improve the quality of dialysis therapy.  The risks, benefits and alternative therapies were reviewed in detail with the patient.  All questions were answered.  The patient agrees to proceed with angio/intervention.      2. Essential hypertension Continue antihypertensive medications as already ordered, these medications have been reviewed and there are no changes at this time.   3. Hyperlipidemia, unspecified hyperlipidemia type Continue statin as ordered and reviewed, no changes at this time    Current Outpatient Medications on File Prior to Visit  Medication Sig Dispense Refill  . clonazePAM (KLONOPIN) 0.5 MG tablet Take 1 tablet by mouth daily as needed.     . diltiazem (CARDIZEM SR) 60 MG  12 hr capsule Take 60 mg by mouth 2 (two) times daily.    . famotidine (PEPCID) 10 MG tablet Take 10 mg by mouth daily as needed for heartburn or indigestion.    . ferric citrate (AURYXIA) 1 GM 210 MG(Fe) tablet Take 420 mg by mouth 3 (three) times daily with meals.    . fluticasone (FLONASE) 50 MCG/ACT nasal spray Place 1 spray into both nostrils 2 (two) times daily as needed.     . iron sucrose in sodium chloride 0.9 % 100 mL Iron Sucrose (Venofer)    . losartan (COZAAR) 25 MG tablet Take 25 mg by mouth daily.    . Methoxy PEG-Epoetin Beta (MIRCERA IJ) Mircera    . ondansetron (ZOFRAN) 4 MG tablet Take 4 mg by mouth every 8 (eight) hours as needed.     . butalbital-aspirin-caffeine (FIORINAL) 50-325-40 MG capsule Take 1 capsule by mouth 2 (two) times daily as needed for headache (for migraine). (Patient not taking: Reported on 03/12/2020)    . HYDROcodone-acetaminophen (NORCO) 5-325 MG tablet Take 1 tablet by mouth every 6 (six) hours as needed for moderate pain. (Patient not taking: Reported on 03/12/2020) 30 tablet 0  . lidocaine-prilocaine (EMLA) cream PLEASE SEE ATTACHED FOR DETAILED DIRECTIONS (Patient not taking: Reported on 03/12/2020)    . nortriptyline (PAMELOR) 50 MG capsule Take 50 mg by mouth at bedtime.    . sertraline (ZOLOFT) 25 MG tablet Take 1 tablet by mouth 1 day or 1 dose.     No current facility-administered medications on file prior to visit.    There are no Patient Instructions on file for this visit. No follow-ups on file.   Kris Hartmann, NP

## 2020-03-22 ENCOUNTER — Other Ambulatory Visit
Admission: RE | Admit: 2020-03-22 | Discharge: 2020-03-22 | Disposition: A | Discharge status: Home / Self Care | Payer: BC Managed Care – PPO | Source: Ambulatory Visit | Attending: Vascular Surgery | Admitting: Vascular Surgery

## 2020-03-22 ENCOUNTER — Other Ambulatory Visit: Payer: Self-pay

## 2020-03-22 DIAGNOSIS — Z01812 Encounter for preprocedural laboratory examination: Secondary | ICD-10-CM | POA: Insufficient documentation

## 2020-03-22 DIAGNOSIS — Z20822 Contact with and (suspected) exposure to covid-19: Secondary | ICD-10-CM | POA: Diagnosis not present

## 2020-03-23 ENCOUNTER — Other Ambulatory Visit (INDEPENDENT_AMBULATORY_CARE_PROVIDER_SITE_OTHER): Payer: Self-pay | Admitting: Nurse Practitioner

## 2020-03-23 LAB — SARS CORONAVIRUS 2 (TAT 6-24 HRS): SARS Coronavirus 2: NEGATIVE

## 2020-03-24 ENCOUNTER — Encounter
Admission: RE | Disposition: A | Discharge status: Home / Self Care | Payer: Self-pay | Source: Home / Self Care | Attending: Vascular Surgery

## 2020-03-24 ENCOUNTER — Other Ambulatory Visit: Payer: Self-pay

## 2020-03-24 ENCOUNTER — Ambulatory Visit
Admission: RE | Admit: 2020-03-24 | Discharge: 2020-03-24 | Disposition: A | Discharge status: Home / Self Care | Payer: BC Managed Care – PPO | Attending: Vascular Surgery | Admitting: Vascular Surgery

## 2020-03-24 ENCOUNTER — Encounter: Payer: Self-pay | Admitting: Vascular Surgery

## 2020-03-24 DIAGNOSIS — Z79899 Other long term (current) drug therapy: Secondary | ICD-10-CM | POA: Insufficient documentation

## 2020-03-24 DIAGNOSIS — T82898A Other specified complication of vascular prosthetic devices, implants and grafts, initial encounter: Secondary | ICD-10-CM

## 2020-03-24 DIAGNOSIS — K219 Gastro-esophageal reflux disease without esophagitis: Secondary | ICD-10-CM | POA: Insufficient documentation

## 2020-03-24 DIAGNOSIS — I120 Hypertensive chronic kidney disease with stage 5 chronic kidney disease or end stage renal disease: Secondary | ICD-10-CM | POA: Diagnosis not present

## 2020-03-24 DIAGNOSIS — Z992 Dependence on renal dialysis: Secondary | ICD-10-CM | POA: Diagnosis not present

## 2020-03-24 DIAGNOSIS — N186 End stage renal disease: Secondary | ICD-10-CM | POA: Diagnosis not present

## 2020-03-24 DIAGNOSIS — T82858A Stenosis of vascular prosthetic devices, implants and grafts, initial encounter: Secondary | ICD-10-CM | POA: Insufficient documentation

## 2020-03-24 DIAGNOSIS — F329 Major depressive disorder, single episode, unspecified: Secondary | ICD-10-CM | POA: Insufficient documentation

## 2020-03-24 DIAGNOSIS — Y841 Kidney dialysis as the cause of abnormal reaction of the patient, or of later complication, without mention of misadventure at the time of the procedure: Secondary | ICD-10-CM | POA: Insufficient documentation

## 2020-03-24 DIAGNOSIS — F4310 Post-traumatic stress disorder, unspecified: Secondary | ICD-10-CM | POA: Insufficient documentation

## 2020-03-24 DIAGNOSIS — E785 Hyperlipidemia, unspecified: Secondary | ICD-10-CM | POA: Diagnosis not present

## 2020-03-24 DIAGNOSIS — F429 Obsessive-compulsive disorder, unspecified: Secondary | ICD-10-CM | POA: Diagnosis not present

## 2020-03-24 HISTORY — PX: PERIPHERAL VASCULAR THROMBECTOMY: CATH118306

## 2020-03-24 LAB — POTASSIUM (ARMC VASCULAR LAB ONLY): Potassium (ARMC vascular lab): 3.1 — ABNORMAL LOW (ref 3.5–5.1)

## 2020-03-24 SURGERY — PERIPHERAL VASCULAR THROMBECTOMY
Anesthesia: Moderate Sedation | Laterality: Left

## 2020-03-24 MED ORDER — HEPARIN SODIUM (PORCINE) 1000 UNIT/ML IJ SOLN
INTRAMUSCULAR | Status: AC
  Filled 2020-03-24: qty 1

## 2020-03-24 MED ORDER — FENTANYL CITRATE (PF) 100 MCG/2ML IJ SOLN
INTRAMUSCULAR | Status: AC
  Filled 2020-03-24: qty 2

## 2020-03-24 MED ORDER — FENTANYL CITRATE (PF) 100 MCG/2ML IJ SOLN
INTRAMUSCULAR | Status: DC | PRN
Start: 2020-03-24 — End: 2020-03-24
  Administered 2020-03-24: 25 ug via INTRAVENOUS
  Administered 2020-03-24: 50 ug via INTRAVENOUS

## 2020-03-24 MED ORDER — ONDANSETRON HCL 4 MG/2ML IJ SOLN: 4.0000 mg | Freq: Four times a day (QID) | INTRAMUSCULAR | Status: DC | PRN

## 2020-03-24 MED ORDER — FAMOTIDINE 20 MG PO TABS: 40.0000 mg | ORAL_TABLET | Freq: Once | ORAL | Status: DC | PRN

## 2020-03-24 MED ORDER — CEFAZOLIN SODIUM-DEXTROSE 1-4 GM/50ML-% IV SOLN
INTRAVENOUS | Status: AC
  Administered 2020-03-24: 1 g via INTRAVENOUS
  Filled 2020-03-24: qty 50

## 2020-03-24 MED ORDER — IODIXANOL 320 MG/ML IV SOLN
INTRAVENOUS | Status: DC | PRN
  Administered 2020-03-24: 40 mL via INTRAVENOUS

## 2020-03-24 MED ORDER — POTASSIUM CHLORIDE CRYS ER 20 MEQ PO TBCR
EXTENDED_RELEASE_TABLET | ORAL | Status: AC
  Filled 2020-03-24: qty 2

## 2020-03-24 MED ORDER — SODIUM CHLORIDE 0.9 % IV SOLN: INTRAVENOUS | Status: DC

## 2020-03-24 MED ORDER — HEPARIN SODIUM (PORCINE) 1000 UNIT/ML IJ SOLN
INTRAMUSCULAR | Status: DC | PRN
  Administered 2020-03-24: 3000 [IU] via INTRAVENOUS

## 2020-03-24 MED ORDER — MIDAZOLAM HCL 2 MG/2ML IJ SOLN
INTRAMUSCULAR | Status: DC | PRN
  Administered 2020-03-24: 1 mg via INTRAVENOUS
  Administered 2020-03-24: 2 mg via INTRAVENOUS

## 2020-03-24 MED ORDER — METHYLPREDNISOLONE SODIUM SUCC 125 MG IJ SOLR: 125.0000 mg | Freq: Once | INTRAMUSCULAR | Status: DC | PRN

## 2020-03-24 MED ORDER — POTASSIUM CHLORIDE 10 MEQ/100ML IV SOLN
10.0000 meq | INTRAVENOUS | Status: DC
  Filled 2020-03-24: qty 100

## 2020-03-24 MED ORDER — HYDROMORPHONE HCL 1 MG/ML IJ SOLN: 1.0000 mg | Freq: Once | INTRAMUSCULAR | Status: DC | PRN

## 2020-03-24 MED ORDER — POTASSIUM CHLORIDE CRYS ER 20 MEQ PO TBCR
40.0000 meq | EXTENDED_RELEASE_TABLET | Freq: Once | ORAL | Status: AC
  Administered 2020-03-24: 40 meq via ORAL

## 2020-03-24 MED ORDER — MIDAZOLAM HCL 2 MG/ML PO SYRP: 8.0000 mg | ORAL_SOLUTION | Freq: Once | ORAL | Status: DC | PRN

## 2020-03-24 MED ORDER — CEFAZOLIN SODIUM-DEXTROSE 1-4 GM/50ML-% IV SOLN: 1.0000 g | Freq: Once | INTRAVENOUS | Status: AC

## 2020-03-24 MED ORDER — DIPHENHYDRAMINE HCL 50 MG/ML IJ SOLN: 50.0000 mg | Freq: Once | INTRAMUSCULAR | Status: DC | PRN

## 2020-03-24 MED ORDER — MIDAZOLAM HCL 5 MG/5ML IJ SOLN
INTRAMUSCULAR | Status: AC
  Filled 2020-03-24: qty 5

## 2020-03-24 SURGICAL SUPPLY — 12 items
BALLN DORADO 7X40X80 (BALLOONS) ×2
BALLN LUTONIX AV 7X60X75 (BALLOONS) ×2
BALLOON DORADO 7X40X80 (BALLOONS) IMPLANT
BALLOON LUTONIX AV 7X60X75 (BALLOONS) IMPLANT
CANNULA 5F STIFF (CANNULA) ×1 IMPLANT
DRAPE BRACHIAL (DRAPES) ×1 IMPLANT
KIT ENCORE 26 ADVANTAGE (KITS) ×1 IMPLANT
PACK ANGIOGRAPHY (CUSTOM PROCEDURE TRAY) ×2 IMPLANT
SHEATH BRITE TIP 6FRX5.5 (SHEATH) ×1 IMPLANT
SHEATH BRITE TIP 7FRX5.5 (SHEATH) ×1 IMPLANT
SUT MNCRL AB 4-0 PS2 18 (SUTURE) ×1 IMPLANT
WIRE MAGIC TOR.035 180C (WIRE) ×1 IMPLANT

## 2020-03-24 NOTE — Progress Notes (Signed)
Dr. Dew at bedside, speaking with pt. Re: procedural results. Pt. Verbalized understanding of conversation.  

## 2020-03-24 NOTE — Interval H&P Note (Signed)
History and Physical Interval Note:  03/24/2020 11:01 AM  Jennifer Newman  has presented today for surgery, with the diagnosis of LT Upper Extremity Thrombectomy   ESRD   Pt to have Covid test on 03-22-20.  The various methods of treatment have been discussed with the patient and family. After consideration of risks, benefits and other options for treatment, the patient has consented to  Procedure(s): PERIPHERAL VASCULAR THROMBECTOMY (Left) as a surgical intervention.  The patient's history has been reviewed, patient examined, no change in status, stable for surgery.  I have reviewed the patient's chart and labs.  Questions were answered to the patient's satisfaction.     Leotis Pain

## 2020-03-24 NOTE — Op Note (Signed)
[facility]    OPERATIVE NOTE   PROCEDURE: 1.   Left brachiocephalic arteriovenous fistula cannulation under ultrasound guidance 2.   Left arm fistulagram including central venogram 3.   Percutaneous transluminal angioplasty of the distal upper arm cephalic vein with 7 mm diameter Lutonix drug-coated and 7 mm diameter high-pressure angioplasty balloons  PRE-OPERATIVE DIAGNOSIS: 1. ESRD 2. Poorly functional left brachiocephalic AVF  POST-OPERATIVE DIAGNOSIS: same as above   SURGEON: Jennifer Pain, MD  ANESTHESIA: local with MCS  ESTIMATED BLOOD LOSS: 3 cc  FINDING(S): 1. Focal stenosis in the distal upper arm cephalic vein about 5 to 6 cm from the anastomosis.  This likely was a vein valve stenosis.  No obvious thrombus was present.  The remainder of the cephalic vein was widely patent and the central venous flow was brisk.  SPECIMEN(S):  None  CONTRAST: 40 cc  FLUORO TIME: 1.5 minutes  MODERATE CONSCIOUS SEDATION TIME: Approximately 34 minutes with 3 mg of Versed and 75 mcg of Fentanyl   INDICATIONS: Jennifer Newman is a 54 y.o. female who presents with malfunctioning left brachiocephalic arteriovenous fistula.  There had been clots pulled from the dialysis center with poor flow.  The patient is scheduled for left arm fistulagram.  The patient is aware the risks include but are not limited to: bleeding, infection, thrombosis of the cannulated access, and possible anaphylactic reaction to the contrast.  The patient is aware of the risks of the procedure and elects to proceed forward.  DESCRIPTION: After full informed written consent was obtained, the patient was brought back to the angiography suite and placed supine upon the angiography table.  The patient was connected to monitoring equipment. Moderate conscious sedation was administered with a face to face encounter with the patient throughout the procedure with my supervision of the RN administering  medicines and monitoring the patient's vital signs and mental status throughout from the start of the procedure until the patient was taken to the recovery room. The left arm was prepped and draped in the standard fashion for a percutaneous access intervention.  Under ultrasound guidance, the perianastomotic portion of the left brachiocephalic arteriovenous fistula was cannulated with a micropuncture needle under direct ultrasound guidance where it was patent and a permanent image was performed.  The microwire was advanced into the fistula and the needle was exchanged for the a microsheath.  I then upsized to a 6 Fr Sheath and imaging was performed.  Hand injections were completed to image the access including the central venous system. This demonstrated focal stenosis in the distal upper arm cephalic vein about 5 to 6 cm from the anastomosis.  This likely was a vein valve stenosis.  No obvious thrombus was present.  The remainder of the cephalic vein was widely patent and the central venous flow was brisk.  The focal stenosis was in the 75 to 80% range.  Based on the images, this patient will need intervention to the stenosis. I then gave the patient 3000 units of intravenous heparin.  I then crossed the stenosis with a Magic Tourqe wire.  Based on the imaging, a 7 mm x 6 cm Lutonix drug-coated angioplasty balloon was selected.  The balloon was centered around the distal upper arm cephalic vein stenosis and inflated to burst inflation for 1 minute.  The waist did not entirely resolve.  I then used a 7 mm diameter by 4 cm length high-pressure angioplasty balloon and inflated this to 20 ATM for 1 minute(s).  On completion imaging, a 20-25% residual stenosis was present.     Based on the completion imaging, no further intervention is necessary.  The wire and balloon were removed from the sheath.  A 4-0 Monocryl purse-string suture was sewn around the sheath.  The sheath was removed while tying down the suture.  A  sterile bandage was applied to the puncture site.  COMPLICATIONS: None  CONDITION: Stable   Jennifer Newman  03/24/2020 12:36 PM   This note was created with Dragon Medical transcription system. Any errors in dictation are purely unintentional.

## 2020-03-24 NOTE — Progress Notes (Signed)
+  _thrill, + bruit to Left UE fistula.

## 2020-05-03 ENCOUNTER — Other Ambulatory Visit (INDEPENDENT_AMBULATORY_CARE_PROVIDER_SITE_OTHER): Payer: Self-pay | Admitting: Vascular Surgery

## 2020-05-03 DIAGNOSIS — Z9862 Peripheral vascular angioplasty status: Secondary | ICD-10-CM

## 2020-05-03 DIAGNOSIS — T829XXS Unspecified complication of cardiac and vascular prosthetic device, implant and graft, sequela: Secondary | ICD-10-CM

## 2020-05-05 ENCOUNTER — Encounter (INDEPENDENT_AMBULATORY_CARE_PROVIDER_SITE_OTHER): Payer: Medicare Other

## 2020-05-05 ENCOUNTER — Ambulatory Visit (INDEPENDENT_AMBULATORY_CARE_PROVIDER_SITE_OTHER): Payer: Medicare Other | Admitting: Nurse Practitioner

## 2020-05-19 ENCOUNTER — Ambulatory Visit (INDEPENDENT_AMBULATORY_CARE_PROVIDER_SITE_OTHER): Payer: BC Managed Care – PPO

## 2020-05-19 ENCOUNTER — Encounter (INDEPENDENT_AMBULATORY_CARE_PROVIDER_SITE_OTHER): Payer: Self-pay | Admitting: Nurse Practitioner

## 2020-05-19 ENCOUNTER — Other Ambulatory Visit: Payer: Self-pay

## 2020-05-19 ENCOUNTER — Ambulatory Visit (INDEPENDENT_AMBULATORY_CARE_PROVIDER_SITE_OTHER): Payer: BC Managed Care – PPO | Admitting: Nurse Practitioner

## 2020-05-19 VITALS — BP 114/75 | HR 96 | Resp 16 | Wt 215.0 lb

## 2020-05-19 DIAGNOSIS — T829XXS Unspecified complication of cardiac and vascular prosthetic device, implant and graft, sequela: Secondary | ICD-10-CM

## 2020-05-19 DIAGNOSIS — I10 Essential (primary) hypertension: Secondary | ICD-10-CM

## 2020-05-19 DIAGNOSIS — Z9862 Peripheral vascular angioplasty status: Secondary | ICD-10-CM | POA: Diagnosis not present

## 2020-05-19 DIAGNOSIS — Z992 Dependence on renal dialysis: Secondary | ICD-10-CM

## 2020-05-19 DIAGNOSIS — E785 Hyperlipidemia, unspecified: Secondary | ICD-10-CM

## 2020-05-19 DIAGNOSIS — N186 End stage renal disease: Secondary | ICD-10-CM

## 2020-05-23 ENCOUNTER — Encounter (INDEPENDENT_AMBULATORY_CARE_PROVIDER_SITE_OTHER): Payer: Self-pay | Admitting: Nurse Practitioner

## 2020-05-23 NOTE — Progress Notes (Signed)
Subjective:    Patient ID: Jennifer Newman, female    DOB: 11/30/1965, 54 y.o.   MRN: [id number] Chief Complaint  Patient presents with  . Follow-up    [facility] 6wk ultrasound    The patient returns to the office for followup status post intervention of the dialysis access left brachiocephalic AV fistula. Following the intervention the access function has significantly improved, with better flow rates and improved KT/V. The patient has not been experiencing increased bleeding times following decannulation and the patient denies increased recirculation. The patient denies an increase in arm swelling. At the present time the patient denies hand pain.  The patient denies amaurosis fugax or recent TIA symptoms. There are no recent neurological changes noted. The patient denies claudication symptoms or rest pain symptoms. The patient denies history of DVT, PE or superficial thrombophlebitis. The patient denies recent episodes of angina or shortness of breath.   The patient has a flow volume of 1605.  The AV fistula appears to be patent throughout.  There are some elevated velocities near the mid upper arm but not significant.      Review of Systems  Hematological: Does not bruise/bleed easily.  All other systems reviewed and are negative.      Objective:   Physical Exam Vitals reviewed.  HENT:     Head: Normocephalic.  Cardiovascular:     Rate and Rhythm: Normal rate.     Pulses: Normal pulses.  Pulmonary:     Effort: Pulmonary effort is normal.  Skin:    General: Skin is warm and dry.  Neurological:     Mental Status: She is alert and oriented to person, place, and time.  Psychiatric:        Mood and Affect: Mood normal.        Behavior: Behavior normal.        Thought Content: Thought content normal.        Judgment: Judgment normal.     BP 114/75 (BP Location: Right Arm)   Pulse 96   Resp 16   Wt 215 lb (97.5 kg)   LMP 08/31/2018   BMI 38.09 kg/m   Past Medical  History:  Diagnosis Date  . Anxiety   . Bipolar disorder ([place])   . Chronic kidney disease   . Depression   . Dysrhythmia    tachycardia  . GERD (gastroesophageal reflux disease)   . Hypertension   . Obsessive-compulsive disorder   . PTSD (post-traumatic stress disorder)   . Tachycardia, unspecified   . Vertigo     Social History   Socioeconomic History  . Marital status: Married    Spouse name: Corene Cornea  . Number of children: 2  . Years of education: Not on file  . Highest education level: Associate degree: occupational, Hotel manager, or vocational program  Occupational History  . 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 Smoking status: Never Smoker  . Smokeless tobacco: Never Used  Vaping Use  . Vaping Use: Never used  Substance and Sexual Activity  . Alcohol use: Not Currently    Alcohol/week: 0.0 standard drinks  . Drug use: Not Currently    Types: Marijuana    Comment: > 39yrs  . Sexual activity: Not Currently  Other Topics Concern  . Not on file  Social History Narrative  . Not on file   Social Determinants of Health   Financial Resource Strain:   . Difficulty of  Paying Living Expenses: Not on file  Food Insecurity:   . Worried About Charity fundraiser in the Last Year: Not on file  . Ran Out of Food in the Last Year: Not on file  Transportation Needs:   . Lack of Transportation (Medical): Not on file  . Lack of Transportation (Non-Medical): Not on file  Physical Activity:   . Days of Exercise per Week: Not on file  . Minutes of Exercise per Session: Not on file  Stress:   . Feeling of Stress : Not on file  Social Connections:   . Frequency of Communication with Friends and Family: Not on file  . Frequency of Social Gatherings with Friends and Family: Not on file  . Attends Religious Services: Not on file  . Active Member of Clubs or Organizations: Not on file  . Attends Archivist  Meetings: Not on file  . Marital Status: Not on file  Intimate Partner Violence:   . Fear of Current or Ex-Partner: Not on file  . Emotionally Abused: Not on file  . Physically Abused: Not on file  . Sexually Abused: Not on file    Past Surgical History:  Procedure Laterality Date  . A/V FISTULAGRAM Left 06/30/2019   Procedure: A/V FISTULAGRAM;  Surgeon: Algernon Huxley, MD;  Location: [place] [facility];  Service: Cardiovascular;  Laterality: Left;  . AV FISTULA PLACEMENT Left 02/26/2019   Procedure: ARTERIOVENOUS (AV) FISTULA CREATION ( RADIAL CEPHALIC );  Surgeon: Algernon Huxley, MD;  Location: [facility];  Service: Vascular;  Laterality: Left;  . AV FISTULA PLACEMENT Left 04/16/2019   Procedure: ARTERIOVENOUS (AV) FISTULA CREATION ( BRACHIAL CEPHALIC );  Surgeon: Algernon Huxley, MD;  Location: [facility];  Service: Vascular;  Laterality: Left;  . DIALYSIS/PERMA CATHETER INSERTION N/A 08/22/2018   Procedure: DIALYSIS temporary catheter INSERTION;  Surgeon: Algernon Huxley, MD;  Location: [place] [facility];  Service: Cardiovascular;  Laterality: N/A;  . DIALYSIS/PERMA CATHETER INSERTION N/A 08/26/2018   Procedure: DIALYSIS/PERMA CATHETER INSERTION;  Surgeon: Algernon Huxley, MD;  Location: [place] [facility];  Service: Cardiovascular;  Laterality: N/A;  . DIALYSIS/PERMA CATHETER INSERTION N/A 01/01/2019   Procedure: DIALYSIS/PERMA CATHETER INSERTION;  Surgeon: Algernon Huxley, MD;  Location: [place] [facility];  Service: Cardiovascular;  Laterality: N/A;  . DIALYSIS/PERMA CATHETER REMOVAL N/A 12/29/2019   Procedure: DIALYSIS/PERMA CATHETER REMOVAL;  Surgeon: Algernon Huxley, MD;  Location: [place] [facility];  Service: Cardiovascular;  Laterality: N/A;  . FISTULA SUPERFICIALIZATION Left 10/29/2019   Procedure: FISTULA SUPERFICIALIZATION;  Surgeon: Algernon Huxley, MD;  Location: [facility];  Service: Vascular;  Laterality: Left;  . none    . PERIPHERAL VASCULAR THROMBECTOMY Left 03/12/2019   Procedure:  PERIPHERAL VASCULAR THROMBECTOMY;  Surgeon: Algernon Huxley, MD;  Location: [place] [facility];  Service: Cardiovascular;  Laterality: Left;  . PERIPHERAL VASCULAR THROMBECTOMY Left 03/24/2020   Procedure: PERIPHERAL VASCULAR THROMBECTOMY;  Surgeon: Algernon Huxley, MD;  Location: [place] [facility];  Service: Cardiovascular;  Laterality: Left;    Family History  Problem Relation Age of Onset  . COPD Neg Hx   . Diabetes Neg Hx   . Hypertension Neg Hx   . CAD Neg Hx     No Known Allergies  CBC Latest Ref Rng & Units 10/29/2019 10/20/2019 04/16/2019  WBC 4.0 - 10.5 K/uL - 6.9 -  Hemoglobin 12.0 - 15.0 g/dL 12.6 11.2(L) 13.3  Hematocrit 36 - 46 %  37.0 33.7(L) 39.0  Platelets 150 - 400 K/uL - 276 -      CMP     Component Value Date/Time   NA 135 10/29/2019 1136   NA 135 (L) 10/09/2013 1548   K 3.5 10/29/2019 1136   K 3.6 10/09/2013 1548   CL 96 (L) 10/29/2019 1136   CL 106 10/09/2013 1548   CO2 26 10/20/2019 1200   CO2 23 10/09/2013 1548   GLUCOSE 104 (H) 10/29/2019 1136   GLUCOSE 84 10/09/2013 1548   BUN 34 (H) 10/29/2019 1136   BUN 16 10/09/2013 1548   CREATININE 3.80 (H) 10/29/2019 1136   CREATININE 0.60 10/09/2013 1548   CALCIUM 9.4 10/20/2019 1200   CALCIUM 8.9 10/09/2013 1548   PROT 7.4 11/18/2018 0854   PROT 8.6 (H) 10/09/2013 1548   ALBUMIN 4.0 11/18/2018 0854   ALBUMIN 4.0 10/09/2013 1548   AST 25 11/18/2018 0854   AST 36 10/09/2013 1548   ALT 19 11/18/2018 0854   ALT 38 10/09/2013 1548   ALKPHOS 187 (H) 11/18/2018 0854   ALKPHOS 118 (H) 10/09/2013 1548   BILITOT 0.4 11/18/2018 0854   BILITOT 0.3 10/09/2013 1548   GFRNONAA 13 (L) 10/20/2019 1200   GFRNONAA >60 10/09/2013 1548   GFRAA 15 (L) 10/20/2019 1200   GFRAA >60 10/09/2013 1548     No results found.     Assessment & Plan:   1. ESRD on dialysis [facility]) Recommend:  The patient is doing well and currently has adequate dialysis access. The patient's dialysis center is not reporting any access  issues. Flow pattern is stable when compared to the prior ultrasound.  The patient should have a duplex ultrasound of the dialysis access in 6 months. The patient will follow-up with me in the office after each ultrasound     2. Essential hypertension Continue antihypertensive medications as already ordered, these medications have been reviewed and there are no changes at this time.   3. Hyperlipidemia, unspecified hyperlipidemia type Continue statin as ordered and reviewed, no changes at this time    Current Outpatient Medications on File Prior to Visit  Medication Sig Dispense Refill  . famotidine (PEPCID) 10 MG tablet Take 10 mg by mouth daily as needed for heartburn or indigestion.    . iron sucrose in sodium chloride 0.9 % 100 mL Iron Sucrose (Venofer)    . losartan (COZAAR) 25 MG tablet Take 25 mg by mouth daily.    . Methoxy PEG-Epoetin Beta (MIRCERA IJ) Mircera    . butalbital-aspirin-caffeine (FIORINAL) 50-325-40 MG capsule Take 1 capsule by mouth 2 (two) times daily as needed for headache (for migraine). (Patient not taking: Reported on 03/12/2020)    . clonazePAM (KLONOPIN) 0.5 MG tablet Take 1 tablet by mouth daily as needed.  (Patient not taking: Reported on 03/24/2020)    . diltiazem (CARDIZEM SR) 60 MG 12 hr capsule Take 60 mg by mouth 2 (two) times daily. (Patient not taking: Reported on 03/24/2020)    . ferric citrate (AURYXIA) 1 GM 210 MG(Fe) tablet Take 420 mg by mouth 3 (three) times daily with meals. (Patient not taking: Reported on 03/24/2020)    . fluticasone (FLONASE) 50 MCG/ACT nasal spray Place 1 spray into both nostrils 2 (two) times daily as needed.  (Patient not taking: Reported on 03/24/2020)    . HYDROcodone-acetaminophen (NORCO) 5-325 MG tablet Take 1 tablet by mouth every 6 (six) hours as needed for moderate pain. (Patient not taking: Reported on 03/12/2020) 30 tablet 0  .  lidocaine-prilocaine (EMLA) cream PLEASE SEE ATTACHED FOR DETAILED DIRECTIONS (Patient  not taking: Reported on 03/12/2020)    . nortriptyline (PAMELOR) 50 MG capsule Take 50 mg by mouth at bedtime. (Patient not taking: Reported on 03/24/2020)    . ondansetron (ZOFRAN) 4 MG tablet Take 4 mg by mouth every 8 (eight) hours as needed.  (Patient not taking: Reported on 03/24/2020)    . sertraline (ZOLOFT) 25 MG tablet Take 1 tablet by mouth 1 day or 1 dose.     No current facility-administered medications on file prior to visit.    There are no Patient Instructions on file for this visit. No follow-ups on file.   Kris Hartmann, NP

## 2020-08-10 DIAGNOSIS — N2581 Secondary hyperparathyroidism of renal origin: Secondary | ICD-10-CM | POA: Insufficient documentation

## 2020-10-06 ENCOUNTER — Encounter: Payer: Self-pay | Admitting: Emergency Medicine

## 2020-10-06 ENCOUNTER — Observation Stay
Admission: EM | Admit: 2020-10-06 | Discharge: 2020-10-08 | Disposition: A | Discharge status: Home / Self Care | Payer: BC Managed Care – PPO | Attending: Internal Medicine | Admitting: Internal Medicine

## 2020-10-06 ENCOUNTER — Emergency Department (HOSPITAL_COMMUNITY): Payer: Medicare Other

## 2020-10-06 ENCOUNTER — Emergency Department: Payer: BC Managed Care – PPO

## 2020-10-06 ENCOUNTER — Other Ambulatory Visit: Payer: Self-pay

## 2020-10-06 DIAGNOSIS — R000 Tachycardia, unspecified: Secondary | ICD-10-CM | POA: Diagnosis not present

## 2020-10-06 DIAGNOSIS — D631 Anemia in chronic kidney disease: Secondary | ICD-10-CM | POA: Diagnosis not present

## 2020-10-06 DIAGNOSIS — R1084 Generalized abdominal pain: Secondary | ICD-10-CM | POA: Diagnosis present

## 2020-10-06 DIAGNOSIS — R944 Abnormal results of kidney function studies: Secondary | ICD-10-CM | POA: Insufficient documentation

## 2020-10-06 DIAGNOSIS — R1011 Right upper quadrant pain: Secondary | ICD-10-CM | POA: Diagnosis not present

## 2020-10-06 DIAGNOSIS — R42 Dizziness and giddiness: Secondary | ICD-10-CM | POA: Diagnosis not present

## 2020-10-06 DIAGNOSIS — I120 Hypertensive chronic kidney disease with stage 5 chronic kidney disease or end stage renal disease: Secondary | ICD-10-CM | POA: Diagnosis not present

## 2020-10-06 DIAGNOSIS — E876 Hypokalemia: Secondary | ICD-10-CM | POA: Diagnosis not present

## 2020-10-06 DIAGNOSIS — I10 Essential (primary) hypertension: Secondary | ICD-10-CM | POA: Diagnosis present

## 2020-10-06 DIAGNOSIS — F429 Obsessive-compulsive disorder, unspecified: Secondary | ICD-10-CM | POA: Insufficient documentation

## 2020-10-06 DIAGNOSIS — R9431 Abnormal electrocardiogram [ECG] [EKG]: Secondary | ICD-10-CM | POA: Diagnosis not present

## 2020-10-06 DIAGNOSIS — F319 Bipolar disorder, unspecified: Secondary | ICD-10-CM | POA: Diagnosis not present

## 2020-10-06 DIAGNOSIS — K529 Noninfective gastroenteritis and colitis, unspecified: Secondary | ICD-10-CM | POA: Diagnosis not present

## 2020-10-06 DIAGNOSIS — Z992 Dependence on renal dialysis: Secondary | ICD-10-CM | POA: Diagnosis not present

## 2020-10-06 DIAGNOSIS — E669 Obesity, unspecified: Secondary | ICD-10-CM | POA: Insufficient documentation

## 2020-10-06 DIAGNOSIS — E860 Dehydration: Secondary | ICD-10-CM | POA: Diagnosis not present

## 2020-10-06 DIAGNOSIS — A0472 Enterocolitis due to Clostridium difficile, not specified as recurrent: Secondary | ICD-10-CM | POA: Diagnosis not present

## 2020-10-06 DIAGNOSIS — A0811 Acute gastroenteropathy due to Norwalk agent: Secondary | ICD-10-CM | POA: Diagnosis present

## 2020-10-06 DIAGNOSIS — K219 Gastro-esophageal reflux disease without esophagitis: Secondary | ICD-10-CM | POA: Insufficient documentation

## 2020-10-06 DIAGNOSIS — F419 Anxiety disorder, unspecified: Secondary | ICD-10-CM | POA: Insufficient documentation

## 2020-10-06 DIAGNOSIS — R112 Nausea with vomiting, unspecified: Secondary | ICD-10-CM | POA: Diagnosis not present

## 2020-10-06 DIAGNOSIS — A0839 Other viral enteritis: Secondary | ICD-10-CM | POA: Diagnosis not present

## 2020-10-06 DIAGNOSIS — F4310 Post-traumatic stress disorder, unspecified: Secondary | ICD-10-CM | POA: Diagnosis not present

## 2020-10-06 DIAGNOSIS — Z79899 Other long term (current) drug therapy: Secondary | ICD-10-CM | POA: Diagnosis not present

## 2020-10-06 DIAGNOSIS — K760 Fatty (change of) liver, not elsewhere classified: Secondary | ICD-10-CM | POA: Diagnosis not present

## 2020-10-06 DIAGNOSIS — N186 End stage renal disease: Secondary | ICD-10-CM | POA: Diagnosis not present

## 2020-10-06 DIAGNOSIS — Z20822 Contact with and (suspected) exposure to covid-19: Secondary | ICD-10-CM | POA: Insufficient documentation

## 2020-10-06 DIAGNOSIS — R10817 Generalized abdominal tenderness: Secondary | ICD-10-CM | POA: Insufficient documentation

## 2020-10-06 DIAGNOSIS — R110 Nausea: Secondary | ICD-10-CM

## 2020-10-06 DIAGNOSIS — N2581 Secondary hyperparathyroidism of renal origin: Secondary | ICD-10-CM | POA: Insufficient documentation

## 2020-10-06 LAB — URINALYSIS, COMPLETE (UACMP) WITH MICROSCOPIC
Bilirubin Urine: NEGATIVE
Glucose, UA: NEGATIVE mg/dL
Ketones, ur: NEGATIVE mg/dL
Leukocytes,Ua: NEGATIVE
Nitrite: NEGATIVE
Protein, ur: 100 mg/dL — AB
Specific Gravity, Urine: 1.014 (ref 1.005–1.030)
pH: 5 (ref 5.0–8.0)

## 2020-10-06 LAB — COMPREHENSIVE METABOLIC PANEL
ALT: 29 U/L (ref 0–44)
AST: 34 U/L (ref 15–41)
Albumin: 4.3 g/dL (ref 3.5–5.0)
Alkaline Phosphatase: 197 U/L — ABNORMAL HIGH (ref 38–126)
Anion gap: 15 (ref 5–15)
BUN: 44 mg/dL — ABNORMAL HIGH (ref 6–20)
CO2: 25 mmol/L (ref 22–32)
Calcium: 9.7 mg/dL (ref 8.9–10.3)
Chloride: 98 mmol/L (ref 98–111)
Creatinine, Ser: 3.09 mg/dL — ABNORMAL HIGH (ref 0.44–1.00)
GFR, Estimated: 18 mL/min — ABNORMAL LOW (ref >60)
Glucose, Bld: 141 mg/dL — ABNORMAL HIGH (ref 70–99)
Potassium: 3.1 mmol/L — ABNORMAL LOW (ref 3.5–5.1)
Sodium: 138 mmol/L (ref 135–145)
Total Bilirubin: 0.7 mg/dL (ref 0.3–1.2)
Total Protein: 8.6 g/dL — ABNORMAL HIGH (ref 6.5–8.1)

## 2020-10-06 LAB — CBC
HCT: 39.5 % (ref 36.0–46.0)
Hemoglobin: 13.5 g/dL (ref 12.0–15.0)
MCH: 30.5 pg (ref 26.0–34.0)
MCHC: 34.2 g/dL (ref 30.0–36.0)
MCV: 89.2 fL (ref 80.0–100.0)
Platelets: 271 10*3/uL (ref 150–400)
RBC: 4.43 MIL/uL (ref 3.87–5.11)
RDW: 13.4 % (ref 11.5–15.5)
WBC: 9.9 10*3/uL (ref 4.0–10.5)
nRBC: 0 % (ref 0.0–0.2)

## 2020-10-06 LAB — RESP PANEL BY RT-PCR (FLU A&B, COVID) ARPGX2
Influenza A by PCR: NEGATIVE
Influenza B by PCR: NEGATIVE
SARS Coronavirus 2 by RT PCR: NEGATIVE

## 2020-10-06 LAB — MAGNESIUM: Magnesium: 2.4 mg/dL (ref 1.7–2.4)

## 2020-10-06 LAB — LIPASE, BLOOD: Lipase: 35 U/L (ref 11–51)

## 2020-10-06 LAB — TROPONIN I (HIGH SENSITIVITY)
Troponin I (High Sensitivity): 13 ng/L (ref <18)
Troponin I (High Sensitivity): 15 ng/L (ref <18)

## 2020-10-06 MED ORDER — METOCLOPRAMIDE HCL 5 MG/ML IJ SOLN
10.0000 mg | Freq: Once | INTRAMUSCULAR | Status: AC
  Administered 2020-10-06: 10 mg via INTRAVENOUS
  Filled 2020-10-06: qty 2

## 2020-10-06 MED ORDER — ONDANSETRON HCL 4 MG/2ML IJ SOLN: 4.0000 mg | Freq: Four times a day (QID) | INTRAMUSCULAR | Status: DC | PRN

## 2020-10-06 MED ORDER — DILTIAZEM HCL ER 60 MG PO CP12
60.0000 mg | ORAL_CAPSULE | Freq: Two times a day (BID) | ORAL | Status: DC
  Administered 2020-10-06 – 2020-10-08 (×4): 60 mg via ORAL
  Filled 2020-10-06 (×6): qty 1

## 2020-10-06 MED ORDER — SODIUM CHLORIDE 0.9 % IV BOLUS
1000.0000 mL | Freq: Once | INTRAVENOUS | Status: AC
  Administered 2020-10-06: 1000 mL via INTRAVENOUS

## 2020-10-06 MED ORDER — ONDANSETRON HCL 4 MG/2ML IJ SOLN
4.0000 mg | Freq: Once | INTRAMUSCULAR | Status: AC
  Administered 2020-10-06: 4 mg via INTRAVENOUS
  Filled 2020-10-06: qty 2

## 2020-10-06 MED ORDER — SODIUM CHLORIDE 0.9% FLUSH
3.0000 mL | Freq: Two times a day (BID) | INTRAVENOUS | Status: DC
  Administered 2020-10-07 (×2): 3 mL via INTRAVENOUS

## 2020-10-06 MED ORDER — ACETAMINOPHEN 650 MG RE SUPP: 650.0000 mg | Freq: Four times a day (QID) | RECTAL | Status: DC | PRN

## 2020-10-06 MED ORDER — POTASSIUM CHLORIDE CRYS ER 20 MEQ PO TBCR
20.0000 meq | EXTENDED_RELEASE_TABLET | Freq: Once | ORAL | Status: AC
  Administered 2020-10-06: 20 meq via ORAL
  Filled 2020-10-06: qty 1

## 2020-10-06 MED ORDER — FAMOTIDINE 20 MG PO TABS: 10.0000 mg | ORAL_TABLET | Freq: Every day | ORAL | Status: DC | PRN

## 2020-10-06 MED ORDER — LOPERAMIDE HCL 2 MG PO CAPS: 2.0000 mg | ORAL_CAPSULE | ORAL | Status: DC | PRN

## 2020-10-06 MED ORDER — ONDANSETRON 4 MG PO TBDP
4.0000 mg | ORAL_TABLET | Freq: Once | ORAL | Status: AC | PRN
  Administered 2020-10-06: 4 mg via ORAL
  Filled 2020-10-06: qty 1

## 2020-10-06 MED ORDER — SODIUM CHLORIDE 0.9 % IV SOLN: INTRAVENOUS | Status: DC

## 2020-10-06 MED ORDER — ACETAMINOPHEN 325 MG PO TABS
650.0000 mg | ORAL_TABLET | Freq: Four times a day (QID) | ORAL | Status: DC | PRN
  Administered 2020-10-07 (×2): 650 mg via ORAL
  Filled 2020-10-06 (×2): qty 2

## 2020-10-06 MED ORDER — LACTATED RINGERS IV BOLUS
500.0000 mL | Freq: Once | INTRAVENOUS | Status: AC
  Administered 2020-10-06: 500 mL via INTRAVENOUS

## 2020-10-06 MED ORDER — IBUPROFEN 400 MG PO TABS
400.0000 mg | ORAL_TABLET | Freq: Once | ORAL | Status: AC
  Administered 2020-10-06: 400 mg via ORAL
  Filled 2020-10-06: qty 1

## 2020-10-06 MED ORDER — ACETAMINOPHEN 325 MG PO TABS
650.0000 mg | ORAL_TABLET | Freq: Once | ORAL | Status: AC
  Administered 2020-10-06: 650 mg via ORAL
  Filled 2020-10-06: qty 2

## 2020-10-06 MED ORDER — HEPARIN SODIUM (PORCINE) 5000 UNIT/ML IJ SOLN
5000.0000 [IU] | Freq: Three times a day (TID) | INTRAMUSCULAR | Status: DC
  Administered 2020-10-06 – 2020-10-08 (×4): 5000 [IU] via SUBCUTANEOUS
  Filled 2020-10-06 (×4): qty 1

## 2020-10-06 NOTE — ED Notes (Signed)
Pt lights dimmed for her comfort

## 2020-10-06 NOTE — ED Notes (Signed)
Late entry - assumed care of pt at 1900 pt denies complaints of nausea initially at 1900. Pt had family at bedside until status of admission. Pt complains of mild HA and mild return of nausea. MD Tamala Julian made aware. This RN medicated pt per [facility]. Admitting physician evaluated pt. Awaiting ready for transport signal. AO x4, denies needs or concerns at this time

## 2020-10-06 NOTE — ED Triage Notes (Signed)
Pt comes into the ED via POV c/o abdominal pain, emesis, and diarrhea that started this morning at 5am.  Pt has ESRD.  Pt neurologically intact with even and unlabored respirations at this time.  Pt denies any CP, SHOB, dizziness, or rectal bleeding.

## 2020-10-06 NOTE — ED Notes (Signed)
Pt placed for transport.

## 2020-10-06 NOTE — ED Provider Notes (Signed)
[facility] Emergency Department Provider Note  ____________________________________________   Event Date/Time   First MD Initiated Contact with Patient 10/06/20 1635     (approximate)  I have reviewed the triage vital signs and the nursing notes.   HISTORY  Chief Complaint Abdominal Pain and Emesis   HPI Jennifer Newman is a 55 y.o. female with past medical history of bipolar disorder, anxiety, depression, GERD, HTN, PTSD, vertigo, and ESRD on HD T TH and S most recently dialyzed yesterday without any recently missed sessions who presents for assessment of acute onset of generalized crampy abdominal pain associated with nonbilious vomiting and diarrhea.  She denies any headache area, sore throat, fevers, chills, cough, chest pain, back pain, shortness of breath, pain with urination, blood in her urine, rash or extremity pain.  No recent falls or injuries.  No significant illicit drug use or EtOH use.  No recent sick contacts where she is aware.  He states that Zofran she received in triage seem to help her nausea a lot and she says that after having several episodes of vomiting diarrhea before coming in her abdominal exam seems to have subsided a little bit.  No other acute concerns at this time.         Past Medical History:  Diagnosis Date  . Anxiety   . Bipolar disorder ([place])   . Chronic kidney disease   . Depression   . Dysrhythmia    tachycardia  . GERD (gastroesophageal reflux disease)   . Hypertension   . Obsessive-compulsive disorder   . PTSD (post-traumatic stress disorder)   . Tachycardia, unspecified   . Vertigo     Patient Active Problem List   Diagnosis Date Noted  . Gastroenteritis 10/06/2020  . ESRD on dialysis ([place]) 03/07/2019  . Disorder of phosphorus metabolism, unspecified 01/07/2019  . Fluid overload, unspecified 11/27/2018  . Coagulation defect, unspecified ([place]) 11/16/2018  . Acute otitis externa of left ear 10/18/2018   . Acute suppurative otitis media without spontaneous rupture of ear drum, left ear 10/18/2018  . Acute viral pharyngitis 10/18/2018  . Cough 10/18/2018  . Migraine without aura and without status migrainosus, not intractable 10/16/2018  . Current moderate episode of major depressive disorder without prior episode ([place]) 10/08/2018  . Dialysis patient ([place]) 10/08/2018  . Headache 09/24/2018  . Antineutrophil cytoplasmic antibody (ANCA) positive 09/04/2018  . Anxiety 09/04/2018  . Essential hypertension 09/04/2018  . Glomerulonephritis 09/04/2018  . 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 Anemia, unspecified 08/22/2018  . 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 Other iron deficiency anemias 08/22/2018  . Acute kidney injury ([place]) 08/21/2018  . Chronic renal failure, stage 5 (HCC) 08/21/2018  . Tachycardia 12/12/2016  . Hyperlipidemia 10/13/2014  . Acute serous otitis media 01/26/2012  . Obstructed eustachian tube 01/26/2012  . Other general medical examination for administrative purposes 06/02/2011    Past Surgical History:  Procedure Laterality Date  . A/V FISTULAGRAM Left 06/30/2019   Procedure: A/V FISTULAGRAM;  Surgeon: Algernon Huxley, MD;  Location: [place] [facility];  Service: Cardiovascular;  Laterality: Left;  . AV FISTULA PLACEMENT Left 02/26/2019   Procedure: ARTERIOVENOUS (AV) FISTULA CREATION ( RADIAL CEPHALIC );  Surgeon: Algernon Huxley, MD;  Location: [facility];  Service: Vascular;  Laterality: Left;  . AV FISTULA PLACEMENT Left 04/16/2019   Procedure: ARTERIOVENOUS (AV) FISTULA CREATION ( BRACHIAL CEPHALIC );  Surgeon: Algernon Huxley, MD;  Location: [facility];  Service: Vascular;  Laterality: Left;  . DIALYSIS/PERMA CATHETER INSERTION N/A 08/22/2018   Procedure: DIALYSIS temporary catheter INSERTION;  Surgeon:  Algernon Huxley, MD;  Location: [place] [facility];  Service: Cardiovascular;  Laterality: N/A;  . DIALYSIS/PERMA CATHETER INSERTION N/A 08/26/2018   Procedure: DIALYSIS/PERMA CATHETER INSERTION;  Surgeon: Algernon Huxley, MD;  Location: [place] [facility];  Service: Cardiovascular;  Laterality: N/A;  . DIALYSIS/PERMA CATHETER INSERTION N/A 01/01/2019   Procedure: DIALYSIS/PERMA CATHETER INSERTION;  Surgeon: Algernon Huxley, MD;  Location: [place] [facility];  Service: Cardiovascular;  Laterality: N/A;  . DIALYSIS/PERMA CATHETER REMOVAL N/A 12/29/2019   Procedure: DIALYSIS/PERMA CATHETER REMOVAL;  Surgeon: Algernon Huxley, MD;  Location: [place] [facility];  Service: Cardiovascular;  Laterality: N/A;  . FISTULA SUPERFICIALIZATION Left 10/29/2019   Procedure: FISTULA SUPERFICIALIZATION;  Surgeon: Algernon Huxley, MD;  Location: [facility];  Service: Vascular;  Laterality: Left;  . none    . PERIPHERAL VASCULAR THROMBECTOMY Left 03/12/2019   Procedure: PERIPHERAL VASCULAR THROMBECTOMY;  Surgeon: Algernon Huxley, MD;  Location: [place] [facility];  Service: Cardiovascular;  Laterality: Left;  . PERIPHERAL VASCULAR THROMBECTOMY Left 03/24/2020   Procedure: PERIPHERAL VASCULAR THROMBECTOMY;  Surgeon: Algernon Huxley, MD;  Location: [place] [facility];  Service: Cardiovascular;  Laterality: Left;    Prior to Admission medications   Medication Sig Start Date End Date Taking? Authorizing Provider  butalbital-aspirin-caffeine [facility]) 50-325-40 MG capsule Take 1 capsule by mouth 2 (two) times daily as needed for headache (for migraine). Patient not taking: Reported on 03/12/2020    [provider]  clonazePAM (KLONOPIN) 0.5 MG tablet Take 1 tablet by mouth daily as needed.  Patient not taking: Reported on 03/24/2020 10/08/18   [provider]  diltiazem (CARDIZEM SR) 60 MG 12 hr capsule Take 60 mg by mouth 2 (two) times daily. Patient not taking: Reported on 03/24/2020 07/11/19   [provider]   famotidine (PEPCID) 10 MG tablet Take 10 mg by mouth daily as needed for heartburn or indigestion.    [provider]  ferric citrate (AURYXIA) 1 GM 210 MG(Fe) tablet Take 420 mg by mouth 3 (three) times daily with meals. Patient not taking: Reported on 03/24/2020    [provider]  fluticasone (FLONASE) 50 MCG/ACT nasal spray Place 1 spray into both nostrils 2 (two) times daily as needed.  Patient not taking: Reported on 03/24/2020 06/09/18   [provider]  HYDROcodone-acetaminophen (NORCO) 5-325 MG tablet Take 1 tablet by mouth every 6 (six) hours as needed for moderate pain. Patient not taking: Reported on 03/12/2020 10/29/19   Algernon Huxley, MD  lidocaine-prilocaine (EMLA) cream PLEASE SEE ATTACHED FOR DETAILED DIRECTIONS Patient not taking: Reported on 03/12/2020 06/17/19   [provider]  losartan (COZAAR) 25 MG tablet Take 25 mg by mouth daily.    [provider]  nortriptyline (PAMELOR) 50 MG capsule Take 50 mg by mouth at bedtime. Patient not taking: Reported on 03/24/2020    [provider]  ondansetron (ZOFRAN) 4 MG tablet Take 4 mg by mouth every 8 (eight) hours as needed.  Patient not taking: Reported on 03/24/2020 04/22/19   [provider]  sertraline (ZOLOFT) 25 MG tablet Take 1 tablet by mouth 1 day or 1 dose. 10/08/18 10/29/19  [provider]    Allergies Patient has no known allergies.  Family History  Problem Relation Age of Onset  .  COPD Neg Hx   . Diabetes Neg Hx   . Hypertension Neg Hx   . CAD Neg Hx     Social History Social History   Tobacco Use  . Smoking status: Never Smoker  . Smokeless tobacco: Never Used  Vaping Use  . Vaping Use: Never used  Substance Use Topics  . Alcohol use: Not Currently    Alcohol/week: 0.0 standard drinks  . Drug use: Not Currently    Types: Marijuana    Comment: > 65yr    Review of Systems  Review of Systems  Constitutional: Negative for chills and  fever.  HENT: Negative for sore throat.   Eyes: Negative for pain.  Respiratory: Negative for cough and stridor.   Cardiovascular: Negative for chest pain.  Gastrointestinal: Positive for abdominal pain, nausea and vomiting.  Genitourinary: Negative for dysuria.  Musculoskeletal: Negative for myalgias.  Skin: Negative for rash.  Neurological: Negative for seizures, loss of consciousness and headaches.  Psychiatric/Behavioral: Negative for suicidal ideas.  All other systems reviewed and are negative.     ____________________________________________   PHYSICAL EXAM:  VITAL SIGNS: ED Triage Vitals  Enc Vitals Group     BP 10/06/20 1319 121/85     Pulse Rate 10/06/20 1319 (!) 138     Resp 10/06/20 1319 20     Temp 10/06/20 1319 98.3 F (36.8 C)     Temp Source 10/06/20 1319 Oral     SpO2 10/06/20 1319 94 %     Weight 10/06/20 1320 200 lb (90.7 kg)     Height 10/06/20 1320 _0  (1.6 m)     Head Circumference --      Peak Flow --      Pain Score 10/06/20 1319 4     Pain Loc --      Pain Edu? --      [place] in [place] --    Vitals:   10/06/20 2259 10/06/20 2300  BP:  116/68  Pulse: (!) 105 (!) 109  Resp:  15  Temp: 99.3 F (37.4 C)   SpO2: 90% 94%   Physical Exam Vitals and nursing note reviewed.  Constitutional:      General: She is not in acute distress.    Appearance: She is well-developed. She is obese.  HENT:     Head: Normocephalic and atraumatic.     Right Ear: External ear normal.     Left Ear: External ear normal.     Nose: Nose normal.     Mouth/Throat:     Mouth: Mucous membranes are dry.  Eyes:     Conjunctiva/sclera: Conjunctivae normal.  Cardiovascular:     Rate and Rhythm: Regular rhythm. Tachycardia present.     Heart sounds: No murmur heard.   Pulmonary:     Effort: Pulmonary effort is normal. No respiratory distress.     Breath sounds: Normal breath sounds.  Abdominal:     Palpations: Abdomen is soft.     Tenderness: There is no abdominal  tenderness. There is no right CVA tenderness or left CVA tenderness.  Musculoskeletal:     Cervical back: Neck supple.  Skin:    General: Skin is warm and dry.     Capillary Refill: Capillary refill takes 2 to 3 seconds.  Neurological:     Mental Status: She is alert and oriented to person, place, and time.  Psychiatric:        Mood and Affect: Mood normal.      ____________________________________________  LABS (all labs ordered are listed, but only abnormal results are displayed)  Labs Reviewed  COMPREHENSIVE METABOLIC PANEL - Abnormal; Notable for the following components:      Result Value   Potassium 3.1 (*)    Glucose, Bld 141 (*)    BUN 44 (*)    Creatinine, Ser 3.09 (*)    Total Protein 8.6 (*)    Alkaline Phosphatase 197 (*)    GFR, Estimated 18 (*)    All other components within normal limits  RESP PANEL BY RT-PCR (FLU A&B, COVID) ARPGX2  GASTROINTESTINAL PANEL BY PCR, STOOL (REPLACES STOOL CULTURE)  C DIFFICILE QUICK SCREEN W PCR REFLEX  LIPASE, BLOOD  CBC  MAGNESIUM  URINALYSIS, COMPLETE (UACMP) WITH MICROSCOPIC  RENAL FUNCTION PANEL  CBC  HIV ANTIBODY (ROUTINE TESTING W REFLEX)  TROPONIN I (HIGH SENSITIVITY)  TROPONIN I (HIGH SENSITIVITY)   ____________________________________________  EKG  Sinus tachycardia with ventricular of 86, normal axis, unremarkable intervals and some ST changes and inferior lateral leads. ____________________________________________  RADIOLOGY  ED MD interpretation: Common bile duct is unremarkable.  There is hepatic steatosis with no clearly visualized gallbladder.  Official radiology report(s): US ABDOMEN LIMITED RUQ (LIVER/GB)  Result Date: 10/06/2020 CLINICAL DATA:  Right upper quadrant pain, nausea, vomiting, diarrhea EXAM: ULTRASOUND ABDOMEN LIMITED RIGHT UPPER QUADRANT COMPARISON:  CT renal 08/21/2018 FINDINGS: Gallbladder: No gallstones or wall thickening visualized. No sonographic Murphy sign noted by  sonographer. Common bile duct: Diameter: 4 mm Liver: No focal lesion identified. Increased parenchymal echogenicity. Portal vein is patent on color Doppler imaging with normal direction of blood flow towards the liver. Other: None. IMPRESSION: Hepatic steatosis. Please note limited evaluation for focal hepatic masses in a patient with hepatic steatosis due to decreased penetration of the acoustic ultrasound waves. Electronically Signed   By: Iven Finn M.D.   On: 10/06/2020 18:02    ____________________________________________   PROCEDURES  Procedure(s) performed (including Critical Care):  .1-3 Lead EKG Interpretation Performed by: Lucrezia Starch, MD Authorized by: Lucrezia Starch, MD     Interpretation: abnormal     ECG rate assessment: tachycardic     Rhythm: sinus tachycardia     Ectopy: none     Conduction: normal       ____________________________________________   INITIAL IMPRESSION / ASSESSMENT AND PLAN / ED COURSE      Patient presents above to history exam for assessment approximate 1 day of some nonbloody nonbilious vomiting diarrhea associate with some crampy abdominal pain.  On my assessment she states she already feels much better after Zofran she received in triage and her belly pain symptoms significantly improved since when it began.  Patient is tachycardic with heart rate 130 on arrival with otherwise stable vital signs.  Differential includes metabolic derangements, acute infectious gastroenteritis, cholecystitis, pancreatitis, and possible symptomatic arrhythmia given tachycardia on arrival.   Will ECG has some nonspecific findings, troponins x2 nonelevated suspicion for ACS or myocarditis.  Lipase 35.assessment acute pancreatitis.  CMP shows a K of 3.1 which was repleted with no other significant derangements and BUN and creatinine as expected elevated consistent with history of ESRD.  No evidence of hepatitis or clear cholestasis given T bili of 0.7  although alk phos is slightly elevated.  Will abdomen" visualized gallbladder upper quadrant ultrasound given she has no tenderness in the right upper quadrant or lower suspicion for this at this time.  CBC shows no leukocytosis or acute anemia.  Patient has no fever or focal tenderness  in the right lower quadrant and overall a low suspicion for PID torsion or appendicitis at this time.  Magnesium is unremarkable.  Covid and flu were negative.  Patient hydrated judiciously given her history of ESRD and treated with 8 mg of Zofran but had persistent tachycardia and nausea on reassessment.  Given persistent tachycardia and nausea will admit for [facility] for continued hydration and nausea control.  ____________________________________________   FINAL CLINICAL IMPRESSION(S) / ED DIAGNOSES  Final diagnoses:  Gastroenteritis  Generalized abdominal pain  Hypokalemia  ESRD (end stage renal disease) (HCC)  Nausea  Dehydration    Medications  diltiazem (CARDIZEM SR) 12 hr capsule 60 mg (60 mg Oral Given 10/06/20 2214)  famotidine (PEPCID) tablet 10 mg (has no administration in time range)  heparin injection 5,000 Units (5,000 Units Subcutaneous Given 10/06/20 2216)  sodium chloride flush (NS) 0.9 % injection 3 mL (has no administration in time range)  acetaminophen (TYLENOL) tablet 650 mg (has no administration in time range)    Or  acetaminophen (TYLENOL) suppository 650 mg (has no administration in time range)  loperamide (IMODIUM) capsule 2 mg (has no administration in time range)  0.9 %  sodium chloride infusion (has no administration in time range)  ondansetron (ZOFRAN) injection 4 mg (has no administration in time range)  ondansetron (ZOFRAN-ODT) disintegrating tablet 4 mg (4 mg Oral Given 10/06/20 1326)  acetaminophen (TYLENOL) tablet 650 mg (650 mg Oral Given 10/06/20 1545)  sodium chloride 0.9 % bolus 1,000 mL (0 mLs Intravenous Stopped 10/06/20 1839)  potassium chloride SA (KLOR-CON) CR tablet 20  mEq (20 mEq Oral Given 10/06/20 1653)  ondansetron (ZOFRAN) injection 4 mg (4 mg Intravenous Given 10/06/20 1653)  lactated ringers bolus 500 mL (0 mLs Intravenous Stopped 10/06/20 1839)  lactated ringers bolus 500 mL (500 mLs Intravenous New Bag/Given 10/06/20 1957)  ibuprofen (ADVIL) tablet 400 mg (400 mg Oral Given 10/06/20 2131)  metoCLOPramide (REGLAN) injection 10 mg (10 mg Intravenous Given 10/06/20 2132)     ED Discharge Orders    None       Note:  This document was prepared using Dragon voice recognition software and may include unintentional dictation errors.   Lucrezia Starch, MD 10/06/20 [phone]

## 2020-10-06 NOTE — H&P (Signed)
History and Physical   HOLLIS OH VEH:[id number] DOB: 10/12/1965 DOA: 10/06/2020  PCP: Barbaraann Boys, MD   Patient coming from: Home  Chief Complaint: Nausea, vomiting, diarrhea, abdominal pain  HPI: Jennifer Newman is a 55 y.o. female with medical history significant of anemia, anxiety, depression, bipolar, OCD, PTSD, ESRD on HD TTS, hypertension, GERD, tachycardia who presents with abdominal pain.  Patient began to experience acute crampy abdominal pain earlier today starting around 5 AM.  He describes the pain is crampy and diffuse, not localized to any particular quadrant.  She has had associated nonbilious nonbloody vomiting episodes as well as frequent diarrhea.   She states she has had about 10 episodes of diarrhea and about 10 episodes of vomiting as well. She denies fevers, chills, chest pain, constipation.  She presented to the ED after symptoms continued to persist.  Due to her vomiting unable to keep down significant p.o.  State she is currently taking only PRN pepcid.  ED Course: Vital signs in the ED significant for tachycardia in the 120s.  Blood pressure stable.  Lab work-up showed CMP with potassium 3.1, BUN 44, creatinine 3.0 (which are stable considering ESRD), glucose 141, calcium 8.6, alk phos 197.  CBC within normal notes.  Lipase normal.  Troponin normal x2.  Respiratory panel flu COVID negative.  Urinalysis, C. difficile, GI panel pending.  Right upper quadrant ultrasound showed hepatic steatosis but no obvious signs of cholecystitis.  Received 20 mEq KCl, 1.5 L IV fluids, Reglan, Zofran in ED.  Review of Systems: As per HPI otherwise all other systems reviewed and are negative.  Past Medical History:  Diagnosis Date  . Anxiety   . Bipolar disorder ([place])   . Chronic kidney disease   . Depression   . Dysrhythmia    tachycardia  . GERD (gastroesophageal reflux disease)   . Hypertension   . Obsessive-compulsive disorder   . PTSD (post-traumatic stress  disorder)   . Tachycardia, unspecified   . Vertigo     Past Surgical History:  Procedure Laterality Date  . A/V FISTULAGRAM Left 06/30/2019   Procedure: A/V FISTULAGRAM;  Surgeon: Algernon Huxley, MD;  Location: [facility];  Service: Cardiovascular;  Laterality: Left;  . AV FISTULA PLACEMENT Left 02/26/2019   Procedure: ARTERIOVENOUS (AV) FISTULA CREATION ( RADIAL CEPHALIC );  Surgeon: Algernon Huxley, MD;  Location: [facility];  Service: Vascular;  Laterality: Left;  . AV FISTULA PLACEMENT Left 04/16/2019   Procedure: ARTERIOVENOUS (AV) FISTULA CREATION ( BRACHIAL CEPHALIC );  Surgeon: Algernon Huxley, MD;  Location: [facility];  Service: Vascular;  Laterality: Left;  . DIALYSIS/PERMA CATHETER INSERTION N/A 08/22/2018   Procedure: DIALYSIS temporary catheter INSERTION;  Surgeon: Algernon Huxley, MD;  Location: [facility];  Service: Cardiovascular;  Laterality: N/A;  . DIALYSIS/PERMA CATHETER INSERTION N/A 08/26/2018   Procedure: DIALYSIS/PERMA CATHETER INSERTION;  Surgeon: Algernon Huxley, MD;  Location: [facility];  Service: Cardiovascular;  Laterality: N/A;  . DIALYSIS/PERMA CATHETER INSERTION N/A 01/01/2019   Procedure: DIALYSIS/PERMA CATHETER INSERTION;  Surgeon: Algernon Huxley, MD;  Location: [facility];  Service: Cardiovascular;  Laterality: N/A;  . DIALYSIS/PERMA CATHETER REMOVAL N/A 12/29/2019   Procedure: DIALYSIS/PERMA CATHETER REMOVAL;  Surgeon: Algernon Huxley, MD;  Location: [facility];  Service: Cardiovascular;  Laterality: N/A;  . FISTULA SUPERFICIALIZATION Left 10/29/2019   Procedure: FISTULA SUPERFICIALIZATION;  Surgeon: Algernon Huxley, MD;  Location: [facility];  Service: Vascular;  Laterality: Left;  . none    . PERIPHERAL VASCULAR THROMBECTOMY Left 03/12/2019   Procedure: PERIPHERAL VASCULAR THROMBECTOMY;  Surgeon: Algernon Huxley, MD;  Location: [facility];  Service: Cardiovascular;  Laterality: Left;  . PERIPHERAL VASCULAR THROMBECTOMY Left  03/24/2020   Procedure: PERIPHERAL VASCULAR THROMBECTOMY;  Surgeon: Algernon Huxley, MD;  Location: [facility];  Service: Cardiovascular;  Laterality: Left;    Social History  reports that she has never smoked. She has never used smokeless tobacco. She reports previous alcohol use. She reports previous drug use. Drug: Marijuana.  No Known Allergies  Family History  Problem Relation Age of Onset  . COPD Neg Hx   . Diabetes Neg Hx   . Hypertension Neg Hx   . CAD Neg Hx   Reviewed on admission  Prior to Admission medications   Medication Sig Start Date End Date Taking? Authorizing Provider  butalbital-aspirin-caffeine [facility]) 50-325-40 MG capsule Take 1 capsule by mouth 2 (two) times daily as needed for headache (for migraine). Patient not taking: Reported on 03/12/2020    [provider]  clonazePAM (KLONOPIN) 0.5 MG tablet Take 1 tablet by mouth daily as needed.  Patient not taking: Reported on 03/24/2020 10/08/18   [provider]  diltiazem (CARDIZEM SR) 60 MG 12 hr capsule Take 60 mg by mouth 2 (two) times daily. Patient not taking: Reported on 03/24/2020 07/11/19   [provider]  famotidine (PEPCID) 10 MG tablet Take 10 mg by mouth daily as needed for heartburn or indigestion.    [provider]  ferric citrate (AURYXIA) 1 GM 210 MG(Fe) tablet Take 420 mg by mouth 3 (three) times daily with meals. Patient not taking: Reported on 03/24/2020    [provider]  fluticasone (FLONASE) 50 MCG/ACT nasal spray Place 1 spray into both nostrils 2 (two) times daily as needed.  Patient not taking: Reported on 03/24/2020 06/09/18   [provider]  HYDROcodone-acetaminophen (NORCO) 5-325 MG tablet Take 1 tablet by mouth every 6 (six) hours as needed for moderate pain. Patient not taking: Reported on 03/12/2020 10/29/19   Algernon Huxley, MD  lidocaine-prilocaine (EMLA) cream PLEASE SEE ATTACHED FOR DETAILED DIRECTIONS Patient not taking:  Reported on 03/12/2020 06/17/19   [provider]  losartan (COZAAR) 25 MG tablet Take 25 mg by mouth daily.    [provider]  nortriptyline (PAMELOR) 50 MG capsule Take 50 mg by mouth at bedtime. Patient not taking: Reported on 03/24/2020    [provider]  ondansetron (ZOFRAN) 4 MG tablet Take 4 mg by mouth every 8 (eight) hours as needed.  Patient not taking: Reported on 03/24/2020 04/22/19   [provider]  sertraline (ZOLOFT) 25 MG tablet Take 1 tablet by mouth 1 day or 1 dose. 10/08/18 10/29/19  [provider]    Physical Exam: Vitals:   10/06/20 1536 10/06/20 1700 10/06/20 1800 10/06/20 1900  BP: 116/90 136/84 (!) 145/69 129/71  Pulse: (!) 139 (!) 126 (!) 121 (!) 121  Resp: 19 18 18 18   Temp:      TempSrc:      SpO2: 94% 98% 98% 98%  Weight:      Height:       Physical Exam Constitutional:      General: She is not in acute distress.    Appearance: Normal appearance. She is ill-appearing.  HENT:     Head: Normocephalic and atraumatic.     Mouth/Throat:  Mouth: Mucous membranes are moist.     Pharynx: Oropharynx is clear.  Eyes:     Extraocular Movements: Extraocular movements intact.     Pupils: Pupils are equal, round, and reactive to light.  Cardiovascular:     Rate and Rhythm: Regular rhythm. Tachycardia present.     Pulses: Normal pulses.     Heart sounds: Normal heart sounds.  Pulmonary:     Effort: Pulmonary effort is normal. No respiratory distress.     Breath sounds: Normal breath sounds.  Abdominal:     General: Bowel sounds are normal. There is no distension.     Palpations: Abdomen is soft.     Tenderness: There is generalized abdominal tenderness.  Musculoskeletal:        General: No swelling or deformity.  Skin:    General: Skin is warm and dry.  Neurological:     General: No focal deficit present.     Mental Status: Mental status is at baseline.    Labs on Admission: I have personally reviewed  following labs and imaging studies  CBC: Recent Labs  Lab 10/06/20 1322  WBC 9.9  HGB 13.5  HCT 39.5  MCV 89.2  PLT 967    Basic Metabolic Panel: Recent Labs  Lab 10/06/20 1322  NA 138  K 3.1*  CL 98  CO2 25  GLUCOSE 141*  BUN 44*  CREATININE 3.09*  CALCIUM 9.7  MG 2.4    GFR: Estimated Creatinine Clearance: 22.8 mL/min (A) (by C-G formula based on SCr of 3.09 mg/dL (H)).  Liver Function Tests: Recent Labs  Lab 10/06/20 1322  AST 34  ALT 29  ALKPHOS 197*  BILITOT 0.7  PROT 8.6*  ALBUMIN 4.3    Urine analysis:    Component Value Date/Time   COLORURINE YELLOW (A) 08/21/2018 0934   APPEARANCEUR CLOUDY (A) 08/21/2018 0934   APPEARANCEUR Hazy 10/09/2013 1623   LABSPEC 1.015 08/21/2018 0934   LABSPEC 1.015 10/09/2013 1623   PHURINE 5.0 08/21/2018 0934   GLUCOSEU 50 (A) 08/21/2018 0934   GLUCOSEU Negative 10/09/2013 1623   HGBUR LARGE (A) 08/21/2018 0934   BILIRUBINUR NEGATIVE 08/21/2018 0934   BILIRUBINUR Negative 10/09/2013 1623   [place] 08/21/2018 0934   PROTEINUR 100 (A) 08/21/2018 0934   NITRITE NEGATIVE 08/21/2018 0934   LEUKOCYTESUR NEGATIVE 08/21/2018 0934   LEUKOCYTESUR Negative 10/09/2013 1623    Radiological Exams on Admission: US ABDOMEN LIMITED RUQ (LIVER/GB)  Result Date: 10/06/2020 CLINICAL DATA:  Right upper quadrant pain, nausea, vomiting, diarrhea EXAM: ULTRASOUND ABDOMEN LIMITED RIGHT UPPER QUADRANT COMPARISON:  CT renal 08/21/2018 FINDINGS: Gallbladder: No gallstones or wall thickening visualized. No sonographic Murphy sign noted by sonographer. Common bile duct: Diameter: 4 mm Liver: No focal lesion identified. Increased parenchymal echogenicity. Portal vein is patent on color Doppler imaging with normal direction of blood flow towards the liver. Other: None. IMPRESSION: Hepatic steatosis. Please note limited evaluation for focal hepatic masses in a patient with hepatic steatosis due to decreased penetration of the acoustic  ultrasound waves. Electronically Signed   By: Iven Finn M.D.   On: 10/06/2020 18:02   EKG: Independently reviewed.  Sinus tachycardia at 136 bpm.  Nonspecific T wave abnormalities in multiple leads, largely T wave flattening.  Difficult to fully interpret given heart rate.  Assessment/Plan Principal Problem:   Gastroenteritis Active Problems:   Tachycardia   Essential hypertension   ESRD on dialysis ([place])  Gastroenteritis > Patient presenting with acute onset crampy abdominal pain with nonbloody  nonbilious vomiting and diarrhea. > Concern for dehydration however it is a dialysis patient so will hydrate gently.  Did receive 1.5 L in the ED. > Alk phos mildly elevated 197 likely secondary to recurrent vomiting. - Continue as needed Zofran - Gentle IV fluids - Follow-up C. difficile and GI pathogen panel - As needed Zofran and loperamide  Tachycardia > Has a history of tachycardia and has been on diltiazem previously.  Diltiazem was reordered in the ED. > Significant tachycardia on presentation today likely due to a combination of dehydration secondary to her gastroenteritis and possibly predisposition given her history of tachycardia. - Monitor on Telemetry and monitor response to Dilt, unclear why she stopped taking this. - Gentle IV fluids  ESRD on HD TTS Hyperkalemia > Last HD yesterday. > Has had multiple episodes of vomiting and diarrhea leading to dehydration.  Has received 1.5 L in ED as above.  We will continue with gentle hydration. > Potassium 3.1 in ED, 20 mEq given.  We will hold off on further repletion given ESRD. -  Will need nephrology consult in the morning - Avoid nephrotoxic agents - Trend renal function and electrolytes - Gentle IV fluids  GERD - Continue as needed Pepcid  Hypertension - Not currently on any home antihypertensives  History of anxiety and depression - Currently not on medications for this  DVT prophylaxis: Heparin  Code Status:    Full  Family Communication:  None on admssion  Disposition Plan:   Patient is from:  Home  Anticipated DC to:  Home  Anticipated DC date:  1 to 2 days  Anticipated DC barriers: None  Consults called:  None, will need nephrology consult in the morning.   Admission status:  Observation, MedSurg with telemetry   Severity of Illness: The appropriate patient status for this patient is OBSERVATION. Observation status is judged to be reasonable and necessary in order to provide the required intensity of service to ensure the patient's safety. The patient's presenting symptoms, physical exam findings, and initial radiographic and laboratory data in the context of their medical condition is felt to place them at decreased risk for further clinical deterioration. Furthermore, it is anticipated that the patient will be medically stable for discharge from the hospital within 2 midnights of admission. The following factors support the patient status of observation.   " The patient's presenting symptoms include abdominal pain, nausea, vomiting, diarrhea. " The physical exam findings include abdominal tenderness, tachycardia. " The initial radiographic and laboratory data are refer quadrant ultrasound with hepatic steatosis, BUN 44, creatinine 3.0, potassium 3.1, alk phos 197.Marland Kitchen   Marcelyn Bruins MD [facility]  How to contact the [facility] Attending or Consulting provider [place] or covering provider during after hours [place], for this patient?   1. Check the care team in [facility] and look for a) attending/consulting [facility] provider listed and b) the [facility] team listed 2. Log into [email or web address] and use [facility]'s universal password to access. If you do not have the password, please contact the hospital operator. 3. Locate the [facility] provider you are looking for under [facility] and page to a number that you can be directly reached. 4. If you still have difficulty reaching the provider, please page the  [facility] (Director on Call) for the Hospitalists listed on amion for assistance.  10/06/2020, 10:00 PM

## 2020-10-07 DIAGNOSIS — K529 Noninfective gastroenteritis and colitis, unspecified: Secondary | ICD-10-CM | POA: Diagnosis not present

## 2020-10-07 DIAGNOSIS — A0472 Enterocolitis due to Clostridium difficile, not specified as recurrent: Secondary | ICD-10-CM | POA: Diagnosis not present

## 2020-10-07 LAB — RENAL FUNCTION PANEL
Albumin: 3.3 g/dL — ABNORMAL LOW (ref 3.5–5.0)
Anion gap: 11 (ref 5–15)
BUN: 46 mg/dL — ABNORMAL HIGH (ref 6–20)
CO2: 25 mmol/L (ref 22–32)
Calcium: 8.7 mg/dL — ABNORMAL LOW (ref 8.9–10.3)
Chloride: 102 mmol/L (ref 98–111)
Creatinine, Ser: 3.18 mg/dL — ABNORMAL HIGH (ref 0.44–1.00)
GFR, Estimated: 17 mL/min — ABNORMAL LOW (ref >60)
Glucose, Bld: 106 mg/dL — ABNORMAL HIGH (ref 70–99)
Phosphorus: 4.9 mg/dL — ABNORMAL HIGH (ref 2.5–4.6)
Potassium: 2.8 mmol/L — ABNORMAL LOW (ref 3.5–5.1)
Sodium: 138 mmol/L (ref 135–145)

## 2020-10-07 LAB — CBC
HCT: 32 % — ABNORMAL LOW (ref 36.0–46.0)
Hemoglobin: 10.7 g/dL — ABNORMAL LOW (ref 12.0–15.0)
MCH: 30.5 pg (ref 26.0–34.0)
MCHC: 33.4 g/dL (ref 30.0–36.0)
MCV: 91.2 fL (ref 80.0–100.0)
Platelets: 205 10*3/uL (ref 150–400)
RBC: 3.51 MIL/uL — ABNORMAL LOW (ref 3.87–5.11)
RDW: 13.8 % (ref 11.5–15.5)
WBC: 5.8 10*3/uL (ref 4.0–10.5)
nRBC: 0 % (ref 0.0–0.2)

## 2020-10-07 LAB — GASTROINTESTINAL PANEL BY PCR, STOOL (REPLACES STOOL CULTURE)

## 2020-10-07 LAB — C DIFFICILE QUICK SCREEN W PCR REFLEX
C Diff antigen: POSITIVE — AB
C Diff toxin: NEGATIVE

## 2020-10-07 LAB — CLOSTRIDIUM DIFFICILE BY PCR, REFLEXED: Toxigenic C. Difficile by PCR: POSITIVE — AB

## 2020-10-07 LAB — HIV ANTIBODY (ROUTINE TESTING W REFLEX): HIV Screen 4th Generation wRfx: NONREACTIVE

## 2020-10-07 MED ORDER — POTASSIUM CHLORIDE CRYS ER 20 MEQ PO TBCR
40.0000 meq | EXTENDED_RELEASE_TABLET | Freq: Once | ORAL | Status: AC
  Administered 2020-10-07: 40 meq via ORAL
  Filled 2020-10-07: qty 2

## 2020-10-07 MED ORDER — CHLORHEXIDINE GLUCONATE CLOTH 2 % EX PADS
6.0000 | MEDICATED_PAD | Freq: Every day | CUTANEOUS | Status: DC
  Administered 2020-10-07 – 2020-10-08 (×2): 6 via TOPICAL

## 2020-10-07 MED ORDER — HEPARIN SODIUM (PORCINE) 1000 UNIT/ML IJ SOLN: 1000.0000 [IU] | INTRAMUSCULAR | Status: DC

## 2020-10-07 MED ORDER — VANCOMYCIN 50 MG/ML ORAL SOLUTION
125.0000 mg | Freq: Four times a day (QID) | ORAL | Status: DC
  Administered 2020-10-07 – 2020-10-08 (×3): 125 mg via ORAL
  Filled 2020-10-07 (×7): qty 2.5

## 2020-10-07 MED ORDER — HEPARIN SODIUM (PORCINE) 1000 UNIT/ML IJ SOLN
2000.0000 [IU] | Freq: Once | INTRAMUSCULAR | Status: AC
  Administered 2020-10-07: 2000 [IU] via INTRAVENOUS

## 2020-10-07 NOTE — Progress Notes (Signed)
Lab called to inform that the patient is positive for Noravirus. Dr. Reesa Chew is notfied

## 2020-10-07 NOTE — Plan of Care (Signed)
Pt. [facility] X alert x 4. No complaint of pain.. No acute changes. No episode of N/V or diarrhea. Fistula on Left upper arm. Positive brut and thrill. Continue to monitor

## 2020-10-07 NOTE — Plan of Care (Signed)

## 2020-10-07 NOTE — Progress Notes (Signed)
PROGRESS NOTE    Jennifer Newman  WIO:[id number] DOB: 10/19/1965 DOA: 10/06/2020 PCP: Barbaraann Boys, MD   Brief Narrative: Taken from H&P.  Jennifer Newman is a 55 y.o. female with medical history significant of anemia, anxiety, depression, bipolar, OCD, PTSD, ESRD on HD TTS, hypertension, GERD, tachycardia who presents with abdominal pain.             Patient began to experience acute crampy abdominal pain earlier today starting around 5 AM.  He describes the pain is crampy and diffuse, not localized to any particular quadrant.  She has had associated nonbilious nonbloody vomiting episodes as well as frequent diarrhea.  Admitted for gastroenteritis and intractable vomiting, unable to keep anything down.  Labs pertinent for positive C. difficile, and norovirus and GI panel.  No recent antibiotic use.  Patient was feeling better this morning and wants to go home.  Dialysis cannot happen earlier in the day as she test is positive for C. Difficile.  If remains stable can be discharged home tomorrow morning.  Subjective: Patient was feeling little improved when seen during morning rounds.  No more nausea or vomiting.  Had 1 episode of diarrhea since morning.  Abdominal pain has been improved.  Denies any recent antibiotic use.  Assessment & Plan:   Principal Problem:   Gastroenteritis Active Problems:   Tachycardia   Essential hypertension   ESRD on dialysis (HCC)  C. difficile colitis, POA.  Patient tested positive for C. difficile antigen, negative toxin and positive PCR, GI pathogen panel positive for norovirus.  Patient is on dialysis, no recent antibiotic use, no leukocytosis.  Little controversial result but we will treat. -Start her on p.o. vancomycin for 10 days. -Continue with supportive care.  Tachycardia.  Seems chronic, patient was on diltiazem in the past and not sure why she was taken off.  Diltiazem was restarted during current hospitalization and resulted in improvement in  heart rate. -Continue diltiazem and monitor  ESRD on HD TTS.  Patient will get her scheduled dialysis today.   Hypokalemia.  Magnesium within normal limit.  Most likely secondary to GI losses. -Replete potassium and monitor  GERD - Continue as needed Pepcid  Hypertension.  Not on any antihypertensives at home. -Monitor blood pressure while she is getting Cardizem.  Objective: Vitals:   10/07/20 1420 10/07/20 1430 10/07/20 1445 10/07/20 1500  BP: 102/61 (!) 105/59 (!) 105/51 108/62  Pulse: (!) 29 (!) 138 (!) 135 (!) 54  Resp: 17 20 (!) 21 (!) 22  Temp:      TempSrc:      SpO2: 96% 93% 95% 94%  Weight:      Height:        Intake/Output Summary (Last 24 hours) at 10/07/2020 1536 Last data filed at 10/07/2020 1500 Gross per 24 hour  Intake 1940 ml  Output 0 ml  Net 1940 ml   Filed Weights   10/06/20 1320 10/07/20 0500  Weight: 90.7 kg 98.4 kg    Examination:  General exam: Appears calm and comfortable  Respiratory system: Clear to auscultation. Respiratory effort normal. Cardiovascular system: S1 & S2 heard, RRR. No JVD, murmurs, rubs, gallops or clicks. Gastrointestinal system: Soft, nontender, nondistended, bowel sounds positive. Central nervous system: Alert and oriented. No focal neurological deficits.Symmetric 5 x 5 power. Extremities: Trace LE edema, no cyanosis, pulses intact and symmetrical. Psychiatry: Judgement and insight appear normal. Mood & affect appropriate.    DVT prophylaxis: Heparin Code Status: Full Family Communication: Discussed with  patient Disposition Plan:  Status is: Observation  The patient remains OBS appropriate and will d/c before 2 midnights.  Dispo: The patient is from: Home              Anticipated d/c is to: Home              Patient currently is medically stable to d/c.   Difficult to place patient No               Level of care: Med-Surg  All the records are reviewed and case discussed with Care Management/Social  Worker. Management plans discussed with the patient, nursing and they are in agreement.  Consultants:   Nephrology  Procedures:  Antimicrobials:  P.o. vancomycin  Data Reviewed: I have personally reviewed following labs and imaging studies  CBC: Recent Labs  Lab 10/06/20 1322 10/07/20 0617  WBC 9.9 5.8  HGB 13.5 10.7*  HCT 39.5 32.0*  MCV 89.2 91.2  PLT 271 299   Basic Metabolic Panel: Recent Labs  Lab 10/06/20 1322 10/07/20 0617  NA 138 138  K 3.1* 2.8*  CL 98 102  CO2 25 25  GLUCOSE 141* 106*  BUN 44* 46*  CREATININE 3.09* 3.18*  CALCIUM 9.7 8.7*  MG 2.4  --   PHOS  --  4.9*   GFR: Estimated Creatinine Clearance: 23.1 mL/min (A) (by C-G formula based on SCr of 3.18 mg/dL (H)). Liver Function Tests: Recent Labs  Lab 10/06/20 1322 10/07/20 0617  AST 34  --   ALT 29  --   ALKPHOS 197*  --   BILITOT 0.7  --   PROT 8.6*  --   ALBUMIN 4.3 3.3*   Recent Labs  Lab 10/06/20 1322  LIPASE 35   No results for input(s): AMMONIA in the last 168 hours. Coagulation Profile: No results for input(s): INR, PROTIME in the last 168 hours. Cardiac Enzymes: No results for input(s): CKTOTAL, CKMB, CKMBINDEX, TROPONINI in the last 168 hours. BNP (last 3 results) No results for input(s): PROBNP in the last 8760 hours. HbA1C: No results for input(s): HGBA1C in the last 72 hours. CBG: No results for input(s): GLUCAP in the last 168 hours. Lipid Profile: No results for input(s): CHOL, HDL, LDLCALC, TRIG, CHOLHDL, LDLDIRECT in the last 72 hours. Thyroid Function Tests: No results for input(s): TSH, T4TOTAL, FREET4, T3FREE, THYROIDAB in the last 72 hours. Anemia Panel: No results for input(s): VITAMINB12, FOLATE, FERRITIN, TIBC, IRON, RETICCTPCT in the last 72 hours. Sepsis Labs: No results for input(s): PROCALCITON, LATICACIDVEN in the last 168 hours.  Recent Results (from the past 240 hour(s))  Resp Panel by RT-PCR (Flu A&B, Covid) Nasopharyngeal Swab     Status:  None   Collection Time: 10/06/20  4:57 PM   Specimen: Nasopharyngeal Swab; Nasopharyngeal(NP) swabs in vial transport medium  Result Value Ref Range Status   SARS Coronavirus 2 by RT PCR NEGATIVE NEGATIVE Final    Comment: (NOTE) SARS-CoV-2 target nucleic acids are NOT DETECTED.  The SARS-CoV-2 RNA is generally detectable in upper respiratory specimens during the acute phase of infection. The lowest concentration of SARS-CoV-2 viral copies this assay can detect is 138 copies/mL. A negative result does not preclude SARS-Cov-2 infection and should not be used as the sole basis for treatment or other patient management decisions. A negative result may occur with  improper specimen collection/handling, submission of specimen other than nasopharyngeal swab, presence of viral mutation(s) within the areas targeted by this assay, and inadequate number  of viral copies(<138 copies/mL). A negative result must be combined with clinical observations, patient history, and epidemiological information. The expected result is Negative.  Fact Sheet for Patients:  EntrepreneurPulse.com.au  Fact Sheet for Healthcare Providers:  IncredibleEmployment.be  This test is no t yet approved or cleared by the [place] FDA and  has been authorized for detection and/or diagnosis of SARS-CoV-2 by FDA under an Emergency Use Authorization (EUA). This EUA will remain  in effect (meaning this test can be used) for the duration of the COVID-19 declaration under Section 564(b)(1) of the Act, 21 U.S.C.section 360bbb-3(b)(1), unless the authorization is terminated  or revoked sooner.       Influenza A by PCR NEGATIVE NEGATIVE Final   Influenza B by PCR NEGATIVE NEGATIVE Final    Comment: (NOTE) The Xpert Xpress SARS-CoV-2/FLU/RSV plus assay is intended as an aid in the diagnosis of influenza from Nasopharyngeal swab specimens and should not be used as a sole basis for  treatment. Nasal washings and aspirates are unacceptable for Xpert Xpress SARS-CoV-2/FLU/RSV testing.  Fact Sheet for Patients: EntrepreneurPulse.com.au  Fact Sheet for Healthcare Providers: IncredibleEmployment.be  This test is not yet approved or cleared by the [place] FDA and has been authorized for detection and/or diagnosis of SARS-CoV-2 by FDA under an Emergency Use Authorization (EUA). This EUA will remain in effect (meaning this test can be used) for the duration of the COVID-19 declaration under Section 564(b)(1) of the Act, 21 U.S.C. section 360bbb-3(b)(1), unless the authorization is terminated or revoked.  Performed at [facility], [address] [place], [place] [address]   Gastrointestinal Panel by PCR , Stool     Status: Abnormal   Collection Time: 10/07/20  8:41 AM   Specimen: Stool  Result Value Ref Range Status   Campylobacter species NOT DETECTED NOT DETECTED Final   Plesimonas shigelloides NOT DETECTED NOT DETECTED Final   Salmonella species NOT DETECTED NOT DETECTED Final   Yersinia enterocolitica NOT DETECTED NOT DETECTED Final   Vibrio species NOT DETECTED NOT DETECTED Final   Vibrio cholerae NOT DETECTED NOT DETECTED Final   Enteroaggregative E coli (EAEC) NOT DETECTED NOT DETECTED Final   Enteropathogenic E coli (EPEC) NOT DETECTED NOT DETECTED Final   Enterotoxigenic E coli (ETEC) NOT DETECTED NOT DETECTED Final   Shiga like toxin producing E coli (STEC) NOT DETECTED NOT DETECTED Final   Shigella/Enteroinvasive E coli (EIEC) NOT DETECTED NOT DETECTED Final   Cryptosporidium NOT DETECTED NOT DETECTED Final   Cyclospora cayetanensis NOT DETECTED NOT DETECTED Final   Entamoeba histolytica NOT DETECTED NOT DETECTED Final   Giardia lamblia NOT DETECTED NOT DETECTED Final   Adenovirus F40/41 NOT DETECTED NOT DETECTED Final   Astrovirus NOT DETECTED NOT DETECTED Final   Norovirus GI/GII DETECTED (A) NOT  DETECTED Final    Comment: RESULT CALLED TO, READ BACK BY AND VERIFIED WITH: RAMAN KAUR AT 1130 ON 10/07/2020 [place].    Rotavirus A NOT DETECTED NOT DETECTED Final   Sapovirus (I, II, IV, and V) NOT DETECTED NOT DETECTED Final    Comment: Performed at [facility], [place], [place] [address]  C Difficile Quick Screen w PCR reflex     Status: Abnormal   Collection Time: 10/07/20  8:41 AM   Specimen: STOOL  Result Value Ref Range Status   C Diff antigen POSITIVE (A) NEGATIVE Final   C Diff toxin NEGATIVE NEGATIVE Final   C Diff interpretation Results are indeterminate. See PCR results.  Final    Comment: Performed at [facility], [address] [place], [place] [address]  C. Diff by PCR, Reflexed     Status: Abnormal   Collection Time: 10/07/20  8:41 AM  Result Value Ref Range Status   Toxigenic C. Difficile by PCR POSITIVE (A) NEGATIVE Final    Comment: Positive for toxigenic C. difficile with little to no toxin production. Only treat if clinical presentation suggests symptomatic illness. Performed at [facility], [address] [place], [place] [address]      Radiology Studies: US ABDOMEN LIMITED RUQ (LIVER/GB)  Result Date: 10/06/2020 CLINICAL DATA:  Right upper quadrant pain, nausea, vomiting, diarrhea EXAM: ULTRASOUND ABDOMEN LIMITED RIGHT UPPER QUADRANT COMPARISON:  CT renal 08/21/2018 FINDINGS: Gallbladder: No gallstones or wall thickening visualized. No sonographic Murphy sign noted by sonographer. Common bile duct: Diameter: 4 mm Liver: No focal lesion identified. Increased parenchymal echogenicity. Portal vein is patent on color Doppler imaging with normal direction of blood flow towards the liver. Other: None. IMPRESSION: Hepatic steatosis. Please note limited evaluation for focal hepatic masses in a patient with hepatic steatosis due to decreased penetration of the acoustic ultrasound waves. Electronically Signed   By: Iven Finn  M.D.   On: 10/06/2020 18:02    Scheduled Meds: . Chlorhexidine Gluconate Cloth  6 each Topical Q0600  . diltiazem  60 mg Oral BID  . heparin  5,000 Units Subcutaneous Q8H  . heparin sodium (porcine)  1,000 Units Intravenous UD  . sodium chloride flush  3 mL Intravenous Q12H  . vancomycin  125 mg Oral QID   Continuous Infusions:   LOS: 0 days   Time spent: 40 minutes. More than 50% of the time was spent in counseling/coordination of care  Lorella Nimrod, MD [facility]  If 7PM-7AM, please contact night-coverage [email or web address]  10/07/2020, 3:36 PM   This record has been created using Systems analyst. Errors have been sought and corrected,but may not always be located. Such creation errors do not reflect on the standard of care.

## 2020-10-07 NOTE — Progress Notes (Signed)
[place], [place] 10/07/20  Subjective:   LOS: 0 Jennifer Newman is a 55 y.o. female with past medical of anemia, anxiety, depression, bipolar, and ESRD on HD. She is a current patient of [facility] and received dialysis treatment at [facility], TTS. She presented to the ED with abdominal pain. She states it started yesterday morning. She says she started having diarrhea and vomiting. Denies  Shortness of breath, chest pain and chills. Denies recent antibiotic use.  She was admitted with gastroenteritis.  Patient seen resting in bed Alert and oriented Says she's had a couple episodes of diarrhea Tolerating clear liquids  Last HD was 10/05/20  Objective:  Vital signs in last 24 hours:  Temp:  [97.6 F (36.4 C)-99.3 F (37.4 C)] 97.6 F (36.4 C) (04/07 1224) Pulse Rate:  [55-139] 94 (04/07 1224) Resp:  [15-20] 17 (04/07 1224) BP: (101-145)/(59-90) 101/61 (04/07 1224) SpO2:  [90 %-98 %] 95 % (04/07 1224) Weight:  [98.4 kg] 98.4 kg (04/07 0500)  Weight change:  Filed Weights   10/06/20 1320 10/07/20 0500  Weight: 90.7 kg 98.4 kg    Intake/Output:    Intake/Output Summary (Last 24 hours) at 10/07/2020 1425 Last data filed at 10/07/2020 0500 Gross per 24 hour  Intake 1740 ml  Output 0 ml  Net 1740 ml     Physical Exam: General: No acute distress  HEENT Moist oral mucosa  Pulm/lungs Clear lungs, normal breathing effort  CVS/Heart S1S2 present, no rubs or gallops  Abdomen:  Soft, tender  Extremities: No peripheral edema  Neurologic: Alert, oriented  Skin: No masses or rashes  Access: LUE AVF       Basic Metabolic Panel:  Recent Labs  Lab 10/06/20 1322 10/07/20 0617  NA 138 138  K 3.1* 2.8*  CL 98 102  CO2 25 25  GLUCOSE 141* 106*  BUN 44* 46*  CREATININE 3.09* 3.18*  CALCIUM 9.7 8.7*  MG 2.4  --   PHOS  --  4.9*     CBC: Recent Labs  Lab 10/06/20 1322 10/07/20 0617  WBC 9.9 5.8  HGB 13.5 10.7*  HCT 39.5 32.0*  MCV 89.2  91.2  PLT 271 205      Lab Results  Component Value Date   HEPBSAG Negative 08/21/2018   HEPBSAB Non Reactive 08/21/2018      Microbiology:  Recent Results (from the past 240 hour(s))  Resp Panel by RT-PCR (Flu A&B, Covid) Nasopharyngeal Swab     Status: None   Collection Time: 10/06/20  4:57 PM   Specimen: Nasopharyngeal Swab; Nasopharyngeal(NP) swabs in vial transport medium  Result Value Ref Range Status   SARS Coronavirus 2 by RT PCR NEGATIVE NEGATIVE Final    Comment: (NOTE) SARS-CoV-2 target nucleic acids are NOT DETECTED.  The SARS-CoV-2 RNA is generally detectable in upper respiratory specimens during the acute phase of infection. The lowest concentration of SARS-CoV-2 viral copies this assay can detect is 138 copies/mL. A negative result does not preclude SARS-Cov-2 infection and should not be used as the sole basis for treatment or other patient management decisions. A negative result may occur with  improper specimen collection/handling, submission of specimen other than nasopharyngeal swab, presence of viral mutation(s) within the areas targeted by this assay, and inadequate number of viral copies(<138 copies/mL). A negative result must be combined with clinical observations, patient history, and epidemiological information. The expected result is Negative.  Fact Sheet for Patients:  EntrepreneurPulse.com.au  Fact Sheet for Healthcare Providers:  IncredibleEmployment.be  This test is no t yet approved or cleared by the [place] and  has been authorized for detection and/or diagnosis of SARS-CoV-2 by FDA under an Emergency Use Authorization (EUA). This EUA will remain  in effect (meaning this test can be used) for the duration of the COVID-19 declaration under Section 564(b)(1) of the Act, 21 U.S.C.section 360bbb-3(b)(1), unless the authorization is terminated  or revoked sooner.       Influenza A by PCR  NEGATIVE NEGATIVE Final   Influenza B by PCR NEGATIVE NEGATIVE Final    Comment: (NOTE) The Xpert Xpress SARS-CoV-2/FLU/RSV plus assay is intended as an aid in the diagnosis of influenza from Nasopharyngeal swab specimens and should not be used as a sole basis for treatment. Nasal washings and aspirates are unacceptable for Xpert Xpress SARS-CoV-2/FLU/RSV testing.  Fact Sheet for Patients: EntrepreneurPulse.com.au  Fact Sheet for Healthcare Providers: IncredibleEmployment.be  This test is not yet approved or cleared by the [place] FDA and has been authorized for detection and/or diagnosis of SARS-CoV-2 by FDA under an Emergency Use Authorization (EUA). This EUA will remain in effect (meaning this test can be used) for the duration of the COVID-19 declaration under Section 564(b)(1) of the Act, 21 U.S.C. section 360bbb-3(b)(1), unless the authorization is terminated or revoked.  Performed at [facility], [address] [place], [place] [address]   Gastrointestinal Panel by PCR , Stool     Status: Abnormal   Collection Time: 10/07/20  8:41 AM   Specimen: Stool  Result Value Ref Range Status   Campylobacter species NOT DETECTED NOT DETECTED Final   Plesimonas shigelloides NOT DETECTED NOT DETECTED Final   Salmonella species NOT DETECTED NOT DETECTED Final   Yersinia enterocolitica NOT DETECTED NOT DETECTED Final   Vibrio species NOT DETECTED NOT DETECTED Final   Vibrio cholerae NOT DETECTED NOT DETECTED Final   Enteroaggregative E coli (EAEC) NOT DETECTED NOT DETECTED Final   Enteropathogenic E coli (EPEC) NOT DETECTED NOT DETECTED Final   Enterotoxigenic E coli (ETEC) NOT DETECTED NOT DETECTED Final   Shiga like toxin producing E coli (STEC) NOT DETECTED NOT DETECTED Final   Shigella/Enteroinvasive E coli (EIEC) NOT DETECTED NOT DETECTED Final   Cryptosporidium NOT DETECTED NOT DETECTED Final   Cyclospora cayetanensis NOT  DETECTED NOT DETECTED Final   Entamoeba histolytica NOT DETECTED NOT DETECTED Final   Giardia lamblia NOT DETECTED NOT DETECTED Final   Adenovirus F40/41 NOT DETECTED NOT DETECTED Final   Astrovirus NOT DETECTED NOT DETECTED Final   Norovirus GI/GII DETECTED (A) NOT DETECTED Final    Comment: RESULT CALLED TO, READ BACK BY AND VERIFIED WITH: RAMAN KAUR AT 1130 ON 10/07/2020 [place].    Rotavirus A NOT DETECTED NOT DETECTED Final   Sapovirus (I, II, IV, and V) NOT DETECTED NOT DETECTED Final    Comment: Performed at [facility], [place], [place] [address]  C Difficile Quick Screen w PCR reflex     Status: Abnormal   Collection Time: 10/07/20  8:41 AM   Specimen: STOOL  Result Value Ref Range Status   C Diff antigen POSITIVE (A) NEGATIVE Final   C Diff toxin NEGATIVE NEGATIVE Final   C Diff interpretation Results are indeterminate. See PCR results.  Final    Comment: Performed at [facility], [address] [place], [place] [address]  C. Diff by PCR, Reflexed     Status: Abnormal   Collection Time: 10/07/20  8:41 AM  Result Value Ref Range Status   Toxigenic C. Difficile by PCR POSITIVE (A) NEGATIVE Final    Comment: Positive for toxigenic C. difficile with little to no toxin production. Only treat if clinical presentation suggests symptomatic illness. Performed at [facility], [address] [place], [place] [address]     Coagulation Studies: No results for input(s): LABPROT, INR in the last 72 hours.  Urinalysis: Recent Labs    10/06/20 2320  COLORURINE YELLOW*  LABSPEC 1.014  PHURINE 5.0  GLUCOSEU NEGATIVE  HGBUR SMALL*  BILIRUBINUR NEGATIVE  KETONESUR NEGATIVE  PROTEINUR 100*  NITRITE NEGATIVE  LEUKOCYTESUR NEGATIVE      Imaging: US ABDOMEN LIMITED RUQ (LIVER/GB)  Result Date: 10/06/2020 CLINICAL DATA:  Right upper quadrant pain, nausea, vomiting, diarrhea EXAM: ULTRASOUND ABDOMEN LIMITED RIGHT UPPER QUADRANT  COMPARISON:  CT renal 08/21/2018 FINDINGS: Gallbladder: No gallstones or wall thickening visualized. No sonographic Murphy sign noted by sonographer. Common bile duct: Diameter: 4 mm Liver: No focal lesion identified. Increased parenchymal echogenicity. Portal vein is patent on color Doppler imaging with normal direction of blood flow towards the liver. Other: None. IMPRESSION: Hepatic steatosis. Please note limited evaluation for focal hepatic masses in a patient with hepatic steatosis due to decreased penetration of the acoustic ultrasound waves. Electronically Signed   By: Iven Finn M.D.   On: 10/06/2020 18:02     Medications:    . Chlorhexidine Gluconate Cloth  6 each Topical Q0600  . diltiazem  60 mg Oral BID  . heparin  5,000 Units Subcutaneous Q8H  . sodium chloride flush  3 mL Intravenous Q12H  . vancomycin  125 mg Oral QID   acetaminophen **OR** acetaminophen, famotidine, loperamide, ondansetron (ZOFRAN) IV  Assessment/ Plan:  56 y.o. female with  was admitted on 10/06/2020 for  Principal Problem:   Gastroenteritis Active Problems:   Tachycardia   Essential hypertension   ESRD on dialysis ([place])  Dehydration [E86.0] Hypokalemia [E87.6] Gastroenteritis [K52.9] Nausea [R11.0] Generalized abdominal pain [R10.84] RUQ pain [R10.11] ESRD (end stage renal disease) ([place]) [N18.6]   [facility]/ TTS/ LUE AVF/95.5kg  #. ESRD on HD - Will continue outpatient scheduling - Scheduled for dialysis today - UF goal 2L  #. Anemia of CKD  Lab Results  Component Value Date   HGB 10.7 (L) 10/07/2020  Auryxia outptient Low dose EPO with HD  #. Secondary hyperparathyroidism of renal origin N 25.81   No results found for: PTH Lab Results  Component Value Date   PHOS 4.9 (H) 10/07/2020  Calcitriol outpatient Monitor calcium and phos level during this admission  # Gastroenteritis - Cdiff positive - Vancomycin - tolerating clear liquids Advance to solids before  discharge   LOS: 0 [employer] 4/7/20222:25 PM  [place], [place]

## 2020-10-08 DIAGNOSIS — K529 Noninfective gastroenteritis and colitis, unspecified: Secondary | ICD-10-CM | POA: Diagnosis not present

## 2020-10-08 DIAGNOSIS — A0472 Enterocolitis due to Clostridium difficile, not specified as recurrent: Secondary | ICD-10-CM | POA: Diagnosis not present

## 2020-10-08 DIAGNOSIS — A0811 Acute gastroenteropathy due to Norwalk agent: Secondary | ICD-10-CM | POA: Diagnosis present

## 2020-10-08 DIAGNOSIS — A09 Infectious gastroenteritis and colitis, unspecified: Secondary | ICD-10-CM | POA: Insufficient documentation

## 2020-10-08 LAB — BASIC METABOLIC PANEL
Anion gap: 10 (ref 5–15)
BUN: 21 mg/dL — ABNORMAL HIGH (ref 6–20)
CO2: 27 mmol/L (ref 22–32)
Calcium: 8.9 mg/dL (ref 8.9–10.3)
Chloride: 99 mmol/L (ref 98–111)
Creatinine, Ser: 2.22 mg/dL — ABNORMAL HIGH (ref 0.44–1.00)
GFR, Estimated: 26 mL/min — ABNORMAL LOW (ref >60)
Glucose, Bld: 88 mg/dL (ref 70–99)
Potassium: 3.1 mmol/L — ABNORMAL LOW (ref 3.5–5.1)
Sodium: 136 mmol/L (ref 135–145)

## 2020-10-08 MED ORDER — DILTIAZEM HCL ER 60 MG PO CP12: 60.0000 mg | ORAL_CAPSULE | Freq: Two times a day (BID) | ORAL | 0 refills | Status: DC

## 2020-10-08 MED ORDER — POTASSIUM CHLORIDE CRYS ER 20 MEQ PO TBCR
40.0000 meq | EXTENDED_RELEASE_TABLET | ORAL | Status: AC
  Administered 2020-10-08 (×2): 40 meq via ORAL
  Filled 2020-10-08 (×2): qty 2

## 2020-10-08 MED ORDER — VANCOMYCIN HCL 125 MG PO CAPS: 125.0000 mg | ORAL_CAPSULE | Freq: Four times a day (QID) | ORAL | 0 refills | Status: AC

## 2020-10-08 NOTE — Plan of Care (Signed)
Patient discharge instructions including medication changes reviewed with patient who verbalized undestanding. Escorted patient via w/c to main entrance for discharge home with family

## 2020-10-08 NOTE — Care Management Obs Status (Signed)
[place] NOTIFICATION   Patient Details  Name: Jennifer Newman MRN: [id number] Date of Birth: 18-Oct-1965   Medicare Observation Status Notification Given:  Yes    Candie Chroman, LCSW 10/08/2020, 9:28 AM

## 2020-10-08 NOTE — Discharge Summary (Signed)
Physician Discharge Summary  EUFEMIA PRINDLE ZOX:[id number] DOB: July 22, 1965 DOA: 10/06/2020  PCP: Barbaraann Boys, MD  Admit date: 10/06/2020 Discharge date: 10/08/2020  Admitted From: Home Disposition: Home  Recommendations for Outpatient Follow-up:  1. Follow up with PCP in 1-2 weeks 2. Follow-up with nephrology as scheduled 3. Continue vancomycin 125 mg p.o. 4 times daily to complete 10-day course for C. difficile colitis  Home Health: No Equipment/Devices: None  Discharge Condition: Stable CODE STATUS: Full code Diet recommendation: Renal diet  History of present illness:  NAVADA OSTERHOUT a 55 y.o.femalewith medical history significant ofanemia, anxiety, depression, bipolar, OCD, PTSD, ESRD on HD TTS, hypertension, GERD, tachycardia who presents with abdominal pain. Patient began to experience acute crampy abdominal pain earlier today starting around 5 AM.He describes the pain is crampy and diffuse, not localized to any particular quadrant. She has had associated nonbilious nonbloody vomiting episodes as well as frequent diarrhea.  Admitted for gastroenteritis and intractable vomiting, unable to keep anything down.  Labs pertinent for positive C. difficile, and norovirus and GI panel.  No recent antibiotic use.  Hospital course:  C. difficile colitis, POA.   Patient tested positive for C. difficile antigen, negative toxin and positive PCR, GI pathogen panel positive for norovirus.  Patient is on dialysis, no recent antibiotic use, no leukocytosis.  Patient was started on oral vancomycin and will complete a 10-day course following discharge.  Bowel movements improved.  Tachycardia.  Seems chronic, patient was on diltiazem in the past and not sure why she was taken off. Diltiazem was restarted during current hospitalization and resulted in improvement in heart rate.  Continue diltiazem 60 mg p.o. twice daily.  ESRD on HD TTS.    Continue outpatient follow-up with nephrology,  HD unit.  Hypokalemia.   Repleted during hospitalization.  Recommend repeat BMP 1-2 weeks.  GERD Continue as needed Pepcid  Hypertension.   Started on Cardizem 60 mg p.o. twice daily.  Outpatient follow-up with PCP/nephrology.  Discharge Diagnoses:  Principal Problem:   Gastroenteritis Active Problems:   Essential hypertension   ESRD on dialysis [facility])   Clostridioides difficile diarrhea   Norovirus    Discharge Instructions  Discharge Instructions    Call MD for:  difficulty breathing, headache or visual disturbances   Complete by: As directed    Call MD for:  extreme fatigue   Complete by: As directed    Call MD for:  persistant dizziness or light-headedness   Complete by: As directed    Call MD for:  persistant nausea and vomiting   Complete by: As directed    Call MD for:  severe uncontrolled pain   Complete by: As directed    Call MD for:  temperature >100.4   Complete by: As directed    Diet - low sodium heart healthy   Complete by: As directed    Increase activity slowly   Complete by: As directed      Allergies as of 10/08/2020   No Known Allergies     Medication List    STOP taking these medications   Auryxia 1 GM 210 MG(Fe) tablet Generic drug: ferric citrate   butalbital-aspirin-caffeine 50-325-40 MG capsule Commonly known as: FIORINAL   clonazePAM 0.5 MG tablet Commonly known as: KLONOPIN   fluticasone 50 MCG/ACT nasal spray Commonly known as: FLONASE   HYDROcodone-acetaminophen 5-325 MG tablet Commonly known as: Norco   lidocaine-prilocaine cream Commonly known as: EMLA   nortriptyline 50 MG capsule Commonly known as: PAMELOR  Zofran 4 MG tablet Generic drug: ondansetron     TAKE these medications   diltiazem 60 MG 12 hr capsule Commonly known as: CARDIZEM SR Take 1 capsule (60 mg total) by mouth 2 (two) times daily.   famotidine 10 MG tablet Commonly known as: PEPCID Take 10 mg by mouth daily as needed for heartburn or  indigestion.   losartan 25 MG tablet Commonly known as: COZAAR Take 25 mg by mouth daily.   sertraline 25 MG tablet Commonly known as: ZOLOFT Take 1 tablet by mouth 1 day or 1 dose.   vancomycin 125 MG capsule Commonly known as: VANCOCIN Take 1 capsule (125 mg total) by mouth 4 (four) times daily for 9 days.       Follow-up Information    Barbaraann Boys, MD. Schedule an appointment as soon as possible for a visit in 1 week(s).   Specialty: Pediatrics Contact information: [address] [place] [address] [phone]              No Known Allergies  Consultations:  Nephrology   Procedures/Studies: US ABDOMEN LIMITED RUQ (LIVER/GB)  Result Date: 10/06/2020 CLINICAL DATA:  Right upper quadrant pain, nausea, vomiting, diarrhea EXAM: ULTRASOUND ABDOMEN LIMITED RIGHT UPPER QUADRANT COMPARISON:  CT renal 08/21/2018 FINDINGS: Gallbladder: No gallstones or wall thickening visualized. No sonographic Murphy sign noted by sonographer. Common bile duct: Diameter: 4 mm Liver: No focal lesion identified. Increased parenchymal echogenicity. Portal vein is patent on color Doppler imaging with normal direction of blood flow towards the liver. Other: None. IMPRESSION: Hepatic steatosis. Please note limited evaluation for focal hepatic masses in a patient with hepatic steatosis due to decreased penetration of the acoustic ultrasound waves. Electronically Signed   By: Iven Finn M.D.   On: 10/06/2020 18:02      Subjective: Patient seen and examined at bedside, resting comfortably.  Diarrhea now has resolved.  Ready for discharge home.  No other complaints or concerns at this time.  Denies headache, no fever/chills/night sweats, no nausea/vomiting/diarrhea, no chest pain, palpitations, no shortness of breath, no abdominal pain, no weakness, no fatigue, no paresthesias.  No acute events overnight per nursing staff.  Discharge Exam: Vitals:   10/08/20 0446 10/08/20 0933  BP: (!)  145/57 (!) 155/62  Pulse: 67 61  Resp: 16 18  Temp: 98.9 F (37.2 C) 98.1 F (36.7 C)  SpO2: 93% 95%   Vitals:   10/07/20 2017 10/08/20 0400 10/08/20 0446 10/08/20 0933  BP: (!) 117/57  (!) 145/57 (!) 155/62  Pulse: 69  67 61  Resp: 16  16 18   Temp: 99.5 F (37.5 C)  98.9 F (37.2 C) 98.1 F (36.7 C)  TempSrc: Oral   Oral  SpO2: 94%  93% 95%  Weight:  101.4 kg    Height:        General: Pt is alert, awake, not in acute distress Cardiovascular: RRR, S1/S2 +, no rubs, no gallops Respiratory: CTA bilaterally, no wheezing, no rhonchi Abdominal: Soft, NT, ND, bowel sounds + Extremities: no edema, no cyanosis    The results of significant diagnostics from this hospitalization (including imaging, microbiology, ancillary and laboratory) are listed below for reference.     Microbiology: Recent Results (from the past 240 hour(s))  Resp Panel by RT-PCR (Flu A&B, Covid) Nasopharyngeal Swab     Status: None   Collection Time: 10/06/20  4:57 PM   Specimen: Nasopharyngeal Swab; Nasopharyngeal(NP) swabs in vial transport medium  Result Value Ref Range Status  SARS Coronavirus 2 by RT PCR NEGATIVE NEGATIVE Final    Comment: (NOTE) SARS-CoV-2 target nucleic acids are NOT DETECTED.  The SARS-CoV-2 RNA is generally detectable in upper respiratory specimens during the acute phase of infection. The lowest concentration of SARS-CoV-2 viral copies this assay can detect is 138 copies/mL. A negative result does not preclude SARS-Cov-2 infection and should not be used as the sole basis for treatment or other patient management decisions. A negative result may occur with  improper specimen collection/handling, submission of specimen other than nasopharyngeal swab, presence of viral mutation(s) within the areas targeted by this assay, and inadequate number of viral copies(<138 copies/mL). A negative result must be combined with clinical observations, patient history, and  epidemiological information. The expected result is Negative.  Fact Sheet for Patients:  EntrepreneurPulse.com.au  Fact Sheet for Healthcare Providers:  IncredibleEmployment.be  This test is no t yet approved or cleared by the [place] FDA and  has been authorized for detection and/or diagnosis of SARS-CoV-2 by FDA under an Emergency Use Authorization (EUA). This EUA will remain  in effect (meaning this test can be used) for the duration of the COVID-19 declaration under Section 564(b)(1) of the Act, 21 U.S.C.section 360bbb-3(b)(1), unless the authorization is terminated  or revoked sooner.       Influenza A by PCR NEGATIVE NEGATIVE Final   Influenza B by PCR NEGATIVE NEGATIVE Final    Comment: (NOTE) The Xpert Xpress SARS-CoV-2/FLU/RSV plus assay is intended as an aid in the diagnosis of influenza from Nasopharyngeal swab specimens and should not be used as a sole basis for treatment. Nasal washings and aspirates are unacceptable for Xpert Xpress SARS-CoV-2/FLU/RSV testing.  Fact Sheet for Patients: EntrepreneurPulse.com.au  Fact Sheet for Healthcare Providers: IncredibleEmployment.be  This test is not yet approved or cleared by the [place] FDA and has been authorized for detection and/or diagnosis of SARS-CoV-2 by FDA under an Emergency Use Authorization (EUA). This EUA will remain in effect (meaning this test can be used) for the duration of the COVID-19 declaration under Section 564(b)(1) of the Act, 21 U.S.C. section 360bbb-3(b)(1), unless the authorization is terminated or revoked.  Performed at [facility], [address] [place], [place] [address]   Gastrointestinal Panel by PCR , Stool     Status: Abnormal   Collection Time: 10/07/20  8:41 AM   Specimen: Stool  Result Value Ref Range Status   Campylobacter species NOT DETECTED NOT DETECTED Final   Plesimonas  shigelloides NOT DETECTED NOT DETECTED Final   Salmonella species NOT DETECTED NOT DETECTED Final   Yersinia enterocolitica NOT DETECTED NOT DETECTED Final   Vibrio species NOT DETECTED NOT DETECTED Final   Vibrio cholerae NOT DETECTED NOT DETECTED Final   Enteroaggregative E coli (EAEC) NOT DETECTED NOT DETECTED Final   Enteropathogenic E coli (EPEC) NOT DETECTED NOT DETECTED Final   Enterotoxigenic E coli (ETEC) NOT DETECTED NOT DETECTED Final   Shiga like toxin producing E coli (STEC) NOT DETECTED NOT DETECTED Final   Shigella/Enteroinvasive E coli (EIEC) NOT DETECTED NOT DETECTED Final   Cryptosporidium NOT DETECTED NOT DETECTED Final   Cyclospora cayetanensis NOT DETECTED NOT DETECTED Final   Entamoeba histolytica NOT DETECTED NOT DETECTED Final   Giardia lamblia NOT DETECTED NOT DETECTED Final   Adenovirus F40/41 NOT DETECTED NOT DETECTED Final   Astrovirus NOT DETECTED NOT DETECTED Final   Norovirus GI/GII DETECTED (A) NOT DETECTED Final    Comment: RESULT CALLED TO, READ BACK BY AND VERIFIED WITH:  RAMAN KAUR AT 1130 ON 10/07/2020 [place].    Rotavirus A NOT DETECTED NOT DETECTED Final   Sapovirus (I, II, IV, and V) NOT DETECTED NOT DETECTED Final    Comment: Performed at [facility], [place], [place] [address]  C Difficile Quick Screen w PCR reflex     Status: Abnormal   Collection Time: 10/07/20  8:41 AM   Specimen: STOOL  Result Value Ref Range Status   C Diff antigen POSITIVE (A) NEGATIVE Final   C Diff toxin NEGATIVE NEGATIVE Final   C Diff interpretation Results are indeterminate. See PCR results.  Final    Comment: Performed at [facility], [address] [place], [place] [address]  C. Diff by PCR, Reflexed     Status: Abnormal   Collection Time: 10/07/20  8:41 AM  Result Value Ref Range Status   Toxigenic C. Difficile by PCR POSITIVE (A) NEGATIVE Final    Comment: Positive for toxigenic C. difficile with little to no toxin production.  Only treat if clinical presentation suggests symptomatic illness. Performed at [facility], [address] [place], [place] [address]      Labs: BNP (last 3 results) No results for input(s): BNP in the last 8760 hours. Basic Metabolic Panel: Recent Labs  Lab 10/06/20 1322 10/07/20 0617 10/08/20 0448  NA 138 138 136  K 3.1* 2.8* 3.1*  CL 98 102 99  CO2 25 25 27   GLUCOSE 141* 106* 88  BUN 44* 46* 21*  CREATININE 3.09* 3.18* 2.22*  CALCIUM 9.7 8.7* 8.9  MG 2.4  --   --   PHOS  --  4.9*  --    Liver Function Tests: Recent Labs  Lab 10/06/20 1322 10/07/20 0617  AST 34  --   ALT 29  --   ALKPHOS 197*  --   BILITOT 0.7  --   PROT 8.6*  --   ALBUMIN 4.3 3.3*   Recent Labs  Lab 10/06/20 1322  LIPASE 35   No results for input(s): AMMONIA in the last 168 hours. CBC: Recent Labs  Lab 10/06/20 1322 10/07/20 0617  WBC 9.9 5.8  HGB 13.5 10.7*  HCT 39.5 32.0*  MCV 89.2 91.2  PLT 271 205   Cardiac Enzymes: No results for input(s): CKTOTAL, CKMB, CKMBINDEX, TROPONINI in the last 168 hours. BNP: Invalid input(s): POCBNP CBG: No results for input(s): GLUCAP in the last 168 hours. D-Dimer No results for input(s): DDIMER in the last 72 hours. Hgb A1c No results for input(s): HGBA1C in the last 72 hours. Lipid Profile No results for input(s): CHOL, HDL, LDLCALC, TRIG, CHOLHDL, LDLDIRECT in the last 72 hours. Thyroid function studies No results for input(s): TSH, T4TOTAL, T3FREE, THYROIDAB in the last 72 hours.  Invalid input(s): FREET3 Anemia work up No results for input(s): VITAMINB12, FOLATE, FERRITIN, TIBC, IRON, RETICCTPCT in the last 72 hours. Urinalysis    Component Value Date/Time   COLORURINE YELLOW (A) 10/06/2020 2320   APPEARANCEUR HAZY (A) 10/06/2020 2320   APPEARANCEUR Hazy 10/09/2013 1623   LABSPEC 1.014 10/06/2020 2320   LABSPEC 1.015 10/09/2013 1623   PHURINE 5.0 10/06/2020 2320   GLUCOSEU NEGATIVE 10/06/2020 2320   GLUCOSEU  Negative 10/09/2013 1623   HGBUR SMALL (A) 10/06/2020 2320   BILIRUBINUR NEGATIVE 10/06/2020 2320   BILIRUBINUR Negative 10/09/2013 1623   KETONESUR NEGATIVE 10/06/2020 2320   PROTEINUR 100 (A) 10/06/2020 2320   NITRITE NEGATIVE 10/06/2020 2320   LEUKOCYTESUR NEGATIVE 10/06/2020 2320  LEUKOCYTESUR Negative 10/09/2013 1623   Sepsis Labs Invalid input(s): PROCALCITONIN,  WBC,  LACTICIDVEN Microbiology Recent Results (from the past 240 hour(s))  Resp Panel by RT-PCR (Flu A&B, Covid) Nasopharyngeal Swab     Status: None   Collection Time: 10/06/20  4:57 PM   Specimen: Nasopharyngeal Swab; Nasopharyngeal(NP) swabs in vial transport medium  Result Value Ref Range Status   SARS Coronavirus 2 by RT PCR NEGATIVE NEGATIVE Final    Comment: (NOTE) SARS-CoV-2 target nucleic acids are NOT DETECTED.  The SARS-CoV-2 RNA is generally detectable in upper respiratory specimens during the acute phase of infection. The lowest concentration of SARS-CoV-2 viral copies this assay can detect is 138 copies/mL. A negative result does not preclude SARS-Cov-2 infection and should not be used as the sole basis for treatment or other patient management decisions. A negative result may occur with  improper specimen collection/handling, submission of specimen other than nasopharyngeal swab, presence of viral mutation(s) within the areas targeted by this assay, and inadequate number of viral copies(<138 copies/mL). A negative result must be combined with clinical observations, patient history, and epidemiological information. The expected result is Negative.  Fact Sheet for Patients:  EntrepreneurPulse.com.au  Fact Sheet for Healthcare Providers:  IncredibleEmployment.be  This test is no t yet approved or cleared by the [place] FDA and  has been authorized for detection and/or diagnosis of SARS-CoV-2 by FDA under an Emergency Use Authorization (EUA). This EUA will  remain  in effect (meaning this test can be used) for the duration of the COVID-19 declaration under Section 564(b)(1) of the Act, 21 U.S.C.section 360bbb-3(b)(1), unless the authorization is terminated  or revoked sooner.       Influenza A by PCR NEGATIVE NEGATIVE Final   Influenza B by PCR NEGATIVE NEGATIVE Final    Comment: (NOTE) The Xpert Xpress SARS-CoV-2/FLU/RSV plus assay is intended as an aid in the diagnosis of influenza from Nasopharyngeal swab specimens and should not be used as a sole basis for treatment. Nasal washings and aspirates are unacceptable for Xpert Xpress SARS-CoV-2/FLU/RSV testing.  Fact Sheet for Patients: EntrepreneurPulse.com.au  Fact Sheet for Healthcare Providers: IncredibleEmployment.be  This test is not yet approved or cleared by the [place] FDA and has been authorized for detection and/or diagnosis of SARS-CoV-2 by FDA under an Emergency Use Authorization (EUA). This EUA will remain in effect (meaning this test can be used) for the duration of the COVID-19 declaration under Section 564(b)(1) of the Act, 21 U.S.C. section 360bbb-3(b)(1), unless the authorization is terminated or revoked.  Performed at [facility], [address] [place], [place] [address]   Gastrointestinal Panel by PCR , Stool     Status: Abnormal   Collection Time: 10/07/20  8:41 AM   Specimen: Stool  Result Value Ref Range Status   Campylobacter species NOT DETECTED NOT DETECTED Final   Plesimonas shigelloides NOT DETECTED NOT DETECTED Final   Salmonella species NOT DETECTED NOT DETECTED Final   Yersinia enterocolitica NOT DETECTED NOT DETECTED Final   Vibrio species NOT DETECTED NOT DETECTED Final   Vibrio cholerae NOT DETECTED NOT DETECTED Final   Enteroaggregative E coli (EAEC) NOT DETECTED NOT DETECTED Final   Enteropathogenic E coli (EPEC) NOT DETECTED NOT DETECTED Final   Enterotoxigenic E coli (ETEC) NOT  DETECTED NOT DETECTED Final   Shiga like toxin producing E coli (STEC) NOT DETECTED NOT DETECTED Final   Shigella/Enteroinvasive E coli (EIEC) NOT DETECTED NOT DETECTED Final   Cryptosporidium NOT DETECTED NOT DETECTED Final  Cyclospora cayetanensis NOT DETECTED NOT DETECTED Final   Entamoeba histolytica NOT DETECTED NOT DETECTED Final   Giardia lamblia NOT DETECTED NOT DETECTED Final   Adenovirus F40/41 NOT DETECTED NOT DETECTED Final   Astrovirus NOT DETECTED NOT DETECTED Final   Norovirus GI/GII DETECTED (A) NOT DETECTED Final    Comment: RESULT CALLED TO, READ BACK BY AND VERIFIED WITH: RAMAN KAUR AT 1130 ON 10/07/2020 [place].    Rotavirus A NOT DETECTED NOT DETECTED Final   Sapovirus (I, II, IV, and V) NOT DETECTED NOT DETECTED Final    Comment: Performed at [facility], [place], [place] [address]  C Difficile Quick Screen w PCR reflex     Status: Abnormal   Collection Time: 10/07/20  8:41 AM   Specimen: STOOL  Result Value Ref Range Status   C Diff antigen POSITIVE (A) NEGATIVE Final   C Diff toxin NEGATIVE NEGATIVE Final   C Diff interpretation Results are indeterminate. See PCR results.  Final    Comment: Performed at [facility], [address] [place], [place] [address]  C. Diff by PCR, Reflexed     Status: Abnormal   Collection Time: 10/07/20  8:41 AM  Result Value Ref Range Status   Toxigenic C. Difficile by PCR POSITIVE (A) NEGATIVE Final    Comment: Positive for toxigenic C. difficile with little to no toxin production. Only treat if clinical presentation suggests symptomatic illness. Performed at [facility], [address], [place], [place] [address]      Time coordinating discharge: Over 30 minutes  SIGNED:   Khylin Gutridge J [place] DO  [facility] 10/08/2020, 10:03 AM

## 2020-10-08 NOTE — Progress Notes (Signed)
[place], [place] 10/08/20  Subjective:   LOS: 0 Jennifer Newman is a 55 y.o. female with past medical of anemia, anxiety, depression, bipolar, and ESRD on HD. She is a current patient of [facility] and received dialysis treatment at [facility], TTS. She presented to the ED with abdominal pain. She states it started yesterday morning. She says she started having diarrhea and vomiting. Denies  Shortness of breath, chest pain and chills. Denies recent antibiotic use.  She was admitted with gastroenteritis. Dx with C Diff and norovirus States she is able to tolerate diet this morning No problems reported with HD yesterday   Objective:  Vital signs in last 24 hours:  Temp:  [98.1 F (36.7 C)-99.5 F (37.5 C)] 98.1 F (36.7 C) (04/08 0933) Pulse Rate:  [28-149] 61 (04/08 0933) Resp:  [16-29] 18 (04/08 0933) BP: (91-155)/(48-92) 155/62 (04/08 0933) SpO2:  [93 %-99 %] 95 % (04/08 0933) Weight:  [101.4 kg] 101.4 kg (04/08 0400)  Weight change: 10.7 kg Filed Weights   10/06/20 1320 10/07/20 0500 10/08/20 0400  Weight: 90.7 kg 98.4 kg 101.4 kg    Intake/Output:    Intake/Output Summary (Last 24 hours) at 10/08/2020 1341 Last data filed at 10/07/2020 1715 Gross per 24 hour  Intake 200 ml  Output 0 ml  Net 200 ml     Physical Exam: General: No acute distress  HEENT Moist oral mucosa  Pulm/lungs Clear lungs, normal breathing effort  CVS/Heart S1S2 present, no rubs or gallops  Abdomen:  Soft, tender  Extremities: No peripheral edema  Neurologic: Alert, oriented  Skin: No masses or rashes  Access: LUE AVF       Basic Metabolic Panel:  Recent Labs  Lab 10/06/20 1322 10/07/20 0617 10/08/20 0448  NA 138 138 136  K 3.1* 2.8* 3.1*  CL 98 102 99  CO2 25 25 27   GLUCOSE 141* 106* 88  BUN 44* 46* 21*  CREATININE 3.09* 3.18* 2.22*  CALCIUM 9.7 8.7* 8.9  MG 2.4  --   --   PHOS  --  4.9*  --      CBC: Recent Labs  Lab 10/06/20 1322  10/07/20 0617  WBC 9.9 5.8  HGB 13.5 10.7*  HCT 39.5 32.0*  MCV 89.2 91.2  PLT 271 205      Lab Results  Component Value Date   HEPBSAG Negative 08/21/2018   HEPBSAB Non Reactive 08/21/2018      Microbiology:  Recent Results (from the past 240 hour(s))  Resp Panel by RT-PCR (Flu A&B, Covid) Nasopharyngeal Swab     Status: None   Collection Time: 10/06/20  4:57 PM   Specimen: Nasopharyngeal Swab; Nasopharyngeal(NP) swabs in vial transport medium  Result Value Ref Range Status   SARS Coronavirus 2 by RT PCR NEGATIVE NEGATIVE Final    Comment: (NOTE) SARS-CoV-2 target nucleic acids are NOT DETECTED.  The SARS-CoV-2 RNA is generally detectable in upper respiratory specimens during the acute phase of infection. The lowest concentration of SARS-CoV-2 viral copies this assay can detect is 138 copies/mL. A negative result does not preclude SARS-Cov-2 infection and should not be used as the sole basis for treatment or other patient management decisions. A negative result may occur with  improper specimen collection/handling, submission of specimen other than nasopharyngeal swab, presence of viral mutation(s) within the areas targeted by this assay, and inadequate number of viral copies(<138 copies/mL). A negative result must be combined with clinical observations, patient history, and epidemiological information.  The expected result is Negative.  Fact Sheet for Patients:  EntrepreneurPulse.com.au  Fact Sheet for Healthcare Providers:  IncredibleEmployment.be  This test is no t yet approved or cleared by the [place] FDA and  has been authorized for detection and/or diagnosis of SARS-CoV-2 by FDA under an Emergency Use Authorization (EUA). This EUA will remain  in effect (meaning this test can be used) for the duration of the COVID-19 declaration under Section 564(b)(1) of the Act, 21 U.S.C.section 360bbb-3(b)(1), unless the  authorization is terminated  or revoked sooner.       Influenza A by PCR NEGATIVE NEGATIVE Final   Influenza B by PCR NEGATIVE NEGATIVE Final    Comment: (NOTE) The Xpert Xpress SARS-CoV-2/FLU/RSV plus assay is intended as an aid in the diagnosis of influenza from Nasopharyngeal swab specimens and should not be used as a sole basis for treatment. Nasal washings and aspirates are unacceptable for Xpert Xpress SARS-CoV-2/FLU/RSV testing.  Fact Sheet for Patients: EntrepreneurPulse.com.au  Fact Sheet for Healthcare Providers: IncredibleEmployment.be  This test is not yet approved or cleared by the [place] FDA and has been authorized for detection and/or diagnosis of SARS-CoV-2 by FDA under an Emergency Use Authorization (EUA). This EUA will remain in effect (meaning this test can be used) for the duration of the COVID-19 declaration under Section 564(b)(1) of the Act, 21 U.S.C. section 360bbb-3(b)(1), unless the authorization is terminated or revoked.  Performed at [facility], [address] [place], [place] [address]   Gastrointestinal Panel by PCR , Stool     Status: Abnormal   Collection Time: 10/07/20  8:41 AM   Specimen: Stool  Result Value Ref Range Status   Campylobacter species NOT DETECTED NOT DETECTED Final   Plesimonas shigelloides NOT DETECTED NOT DETECTED Final   Salmonella species NOT DETECTED NOT DETECTED Final   Yersinia enterocolitica NOT DETECTED NOT DETECTED Final   Vibrio species NOT DETECTED NOT DETECTED Final   Vibrio cholerae NOT DETECTED NOT DETECTED Final   Enteroaggregative E coli (EAEC) NOT DETECTED NOT DETECTED Final   Enteropathogenic E coli (EPEC) NOT DETECTED NOT DETECTED Final   Enterotoxigenic E coli (ETEC) NOT DETECTED NOT DETECTED Final   Shiga like toxin producing E coli (STEC) NOT DETECTED NOT DETECTED Final   Shigella/Enteroinvasive E coli (EIEC) NOT DETECTED NOT DETECTED Final    Cryptosporidium NOT DETECTED NOT DETECTED Final   Cyclospora cayetanensis NOT DETECTED NOT DETECTED Final   Entamoeba histolytica NOT DETECTED NOT DETECTED Final   Giardia lamblia NOT DETECTED NOT DETECTED Final   Adenovirus F40/41 NOT DETECTED NOT DETECTED Final   Astrovirus NOT DETECTED NOT DETECTED Final   Norovirus GI/GII DETECTED (A) NOT DETECTED Final    Comment: RESULT CALLED TO, READ BACK BY AND VERIFIED WITH: RAMAN KAUR AT 1130 ON 10/07/2020 [place].    Rotavirus A NOT DETECTED NOT DETECTED Final   Sapovirus (I, II, IV, and V) NOT DETECTED NOT DETECTED Final    Comment: Performed at [facility], [place], [place] [address]  C Difficile Quick Screen w PCR reflex     Status: Abnormal   Collection Time: 10/07/20  8:41 AM   Specimen: STOOL  Result Value Ref Range Status   C Diff antigen POSITIVE (A) NEGATIVE Final   C Diff toxin NEGATIVE NEGATIVE Final   C Diff interpretation Results are indeterminate. See PCR results.  Final    Comment: Performed at [facility], [address], [place], [place] [address]  C. Diff by PCR, Reflexed     Status: Abnormal   Collection Time: 10/07/20  8:41 AM  Result Value Ref Range Status   Toxigenic C. Difficile by PCR POSITIVE (A) NEGATIVE Final    Comment: Positive for toxigenic C. difficile with little to no toxin production. Only treat if clinical presentation suggests symptomatic illness. Performed at [facility] [address] [place], [place] [address]     Coagulation Studies: No results for input(s): LABPROT, INR in the last 72 hours.  Urinalysis: Recent Labs    10/06/20 2320  COLORURINE YELLOW*  LABSPEC 1.014  PHURINE 5.0  GLUCOSEU NEGATIVE  HGBUR SMALL*  BILIRUBINUR NEGATIVE  KETONESUR NEGATIVE  PROTEINUR 100*  NITRITE NEGATIVE  LEUKOCYTESUR NEGATIVE      Imaging: US ABDOMEN LIMITED RUQ (LIVER/GB)  Result Date: 10/06/2020 CLINICAL DATA:  Right upper quadrant pain, nausea,  vomiting, diarrhea EXAM: ULTRASOUND ABDOMEN LIMITED RIGHT UPPER QUADRANT COMPARISON:  CT renal 08/21/2018 FINDINGS: Gallbladder: No gallstones or wall thickening visualized. No sonographic Murphy sign noted by sonographer. Common bile duct: Diameter: 4 mm Liver: No focal lesion identified. Increased parenchymal echogenicity. Portal vein is patent on color Doppler imaging with normal direction of blood flow towards the liver. Other: None. IMPRESSION: Hepatic steatosis. Please note limited evaluation for focal hepatic masses in a patient with hepatic steatosis due to decreased penetration of the acoustic ultrasound waves. Electronically Signed   By: Iven Finn M.D.   On: 10/06/2020 18:02     Medications:    . Chlorhexidine Gluconate Cloth  6 each Topical Q0600  . diltiazem  60 mg Oral BID  . heparin  5,000 Units Subcutaneous Q8H  . heparin sodium (porcine)  1,000 Units Intravenous UD  . sodium chloride flush  3 mL Intravenous Q12H  . vancomycin  125 mg Oral QID   acetaminophen **OR** acetaminophen, famotidine, loperamide, ondansetron (ZOFRAN) IV  Assessment/ Plan:  55 y.o. female with  was admitted on 10/06/2020 for  Principal Problem:   Gastroenteritis Active Problems:   Essential hypertension   ESRD on dialysis ([place])   Clostridioides difficile diarrhea   Norovirus  Dehydration [E86.0] Hypokalemia [E87.6] Gastroenteritis [K52.9] Nausea [R11.0] Generalized abdominal pain [R10.84] RUQ pain [R10.11] ESRD (end stage renal disease) ([place]) [N18.6]   [facility]/ TTS/ LUE AVF/95.5kg  #. ESRD on HD - Will continue outpatient scheduling -Continue routine hemodialysis tomorrow  #. Anemia of CKD  Lab Results  Component Value Date   HGB 10.7 (L) 10/07/2020  Auryxia outptient Low dose EPO with HD  #. Secondary hyperparathyroidism of renal origin N 25.81   No results found for: PTH Lab Results  Component Value Date   PHOS 4.9 (H) 10/07/2020  Calcitriol  outpatient Monitor calcium and phos level during this admission  # Gastroenteritis - Cdiff positive, norovirus positive - Vancomycin - tolerating clear liquids Advance to solids before discharge   LOS: 0 Raquon Milledge [facility] 4/8/20221:41 PM  [place], [place]

## 2020-10-08 NOTE — Plan of Care (Signed)
Patient to be discharged home.  Patient ambulating independently in room. Discharge instructions reviewed with patient along with medication changes and appointment follow-up.

## 2020-10-08 NOTE — Progress Notes (Signed)
[place], [place] 10/08/20  Subjective:   LOS: 0 Jennifer Newman is a 55 y.o. female with past medical of anemia, anxiety, depression, bipolar, and ESRD on HD. She is a current patient of [facility] and received dialysis treatment at [facility], TTS. She presented to the ED with abdominal pain. She states it started yesterday morning. She says she started having diarrhea and vomiting. Denies  Shortness of breath, chest pain and chills. Denies recent antibiotic use.  She was admitted with gastroenteritis.  Patient seen resting in bed Alert and oriented Says her diarrhea has resolved Tolerating clear liquids Was seen later, after solids and tolerated well Denies nausea and vomiting    Objective:  Vital signs in last 24 hours:  Temp:  [97.6 F (36.4 C)-99.5 F (37.5 C)] 98.1 F (36.7 C) (04/08 0933) Pulse Rate:  [28-149] 61 (04/08 0933) Resp:  [16-29] 18 (04/08 0933) BP: (91-155)/(48-92) 155/62 (04/08 0933) SpO2:  [93 %-99 %] 95 % (04/08 0933) Weight:  [101.4 kg] 101.4 kg (04/08 0400)  Weight change: 10.7 kg Filed Weights   10/06/20 1320 10/07/20 0500 10/08/20 0400  Weight: 90.7 kg 98.4 kg 101.4 kg    Intake/Output:    Intake/Output Summary (Last 24 hours) at 10/08/2020 1032 Last data filed at 10/07/2020 1715 Gross per 24 hour  Intake 200 ml  Output 0 ml  Net 200 ml     Physical Exam: General: No acute distress  HEENT Moist oral mucosa  Pulm/lungs Clear lungs, normal breathing effort  CVS/Heart S1S2 present, no rubs or gallops  Abdomen:  Soft, tender  Extremities: No peripheral edema  Neurologic: Alert, oriented  Skin: No masses or rashes  Access: LUE AVF       Basic Metabolic Panel:  Recent Labs  Lab 10/06/20 1322 10/07/20 0617 10/08/20 0448  NA 138 138 136  K 3.1* 2.8* 3.1*  CL 98 102 99  CO2 25 25 27   GLUCOSE 141* 106* 88  BUN 44* 46* 21*  CREATININE 3.09* 3.18* 2.22*  CALCIUM 9.7 8.7* 8.9  MG 2.4  --   --   PHOS  --   4.9*  --      CBC: Recent Labs  Lab 10/06/20 1322 10/07/20 0617  WBC 9.9 5.8  HGB 13.5 10.7*  HCT 39.5 32.0*  MCV 89.2 91.2  PLT 271 205      Lab Results  Component Value Date   HEPBSAG Negative 08/21/2018   HEPBSAB Non Reactive 08/21/2018      Microbiology:  Recent Results (from the past 240 hour(s))  Resp Panel by RT-PCR (Flu A&B, Covid) Nasopharyngeal Swab     Status: None   Collection Time: 10/06/20  4:57 PM   Specimen: Nasopharyngeal Swab; Nasopharyngeal(NP) swabs in vial transport medium  Result Value Ref Range Status   SARS Coronavirus 2 by RT PCR NEGATIVE NEGATIVE Final    Comment: (NOTE) SARS-CoV-2 target nucleic acids are NOT DETECTED.  The SARS-CoV-2 RNA is generally detectable in upper respiratory specimens during the acute phase of infection. The lowest concentration of SARS-CoV-2 viral copies this assay can detect is 138 copies/mL. A negative result does not preclude SARS-Cov-2 infection and should not be used as the sole basis for treatment or other patient management decisions. A negative result may occur with  improper specimen collection/handling, submission of specimen other than nasopharyngeal swab, presence of viral mutation(s) within the areas targeted by this assay, and inadequate number of viral copies(<138 copies/mL). A negative result must be  combined with clinical observations, patient history, and epidemiological information. The expected result is Negative.  Fact Sheet for Patients:  EntrepreneurPulse.com.au  Fact Sheet for Healthcare Providers:  IncredibleEmployment.be  This test is no t yet approved or cleared by the [place] FDA and  has been authorized for detection and/or diagnosis of SARS-CoV-2 by FDA under an Emergency Use Authorization (EUA). This EUA will remain  in effect (meaning this test can be used) for the duration of the COVID-19 declaration under Section 564(b)(1) of the  Act, 21 U.S.C.section 360bbb-3(b)(1), unless the authorization is terminated  or revoked sooner.       Influenza A by PCR NEGATIVE NEGATIVE Final   Influenza B by PCR NEGATIVE NEGATIVE Final    Comment: (NOTE) The Xpert Xpress SARS-CoV-2/FLU/RSV plus assay is intended as an aid in the diagnosis of influenza from Nasopharyngeal swab specimens and should not be used as a sole basis for treatment. Nasal washings and aspirates are unacceptable for Xpert Xpress SARS-CoV-2/FLU/RSV testing.  Fact Sheet for Patients: EntrepreneurPulse.com.au  Fact Sheet for Healthcare Providers: IncredibleEmployment.be  This test is not yet approved or cleared by the [place] FDA and has been authorized for detection and/or diagnosis of SARS-CoV-2 by FDA under an Emergency Use Authorization (EUA). This EUA will remain in effect (meaning this test can be used) for the duration of the COVID-19 declaration under Section 564(b)(1) of the Act, 21 U.S.C. section 360bbb-3(b)(1), unless the authorization is terminated or revoked.  Performed at [facility], [address] [place], [place] [address]   Gastrointestinal Panel by PCR , Stool     Status: Abnormal   Collection Time: 10/07/20  8:41 AM   Specimen: Stool  Result Value Ref Range Status   Campylobacter species NOT DETECTED NOT DETECTED Final   Plesimonas shigelloides NOT DETECTED NOT DETECTED Final   Salmonella species NOT DETECTED NOT DETECTED Final   Yersinia enterocolitica NOT DETECTED NOT DETECTED Final   Vibrio species NOT DETECTED NOT DETECTED Final   Vibrio cholerae NOT DETECTED NOT DETECTED Final   Enteroaggregative E coli (EAEC) NOT DETECTED NOT DETECTED Final   Enteropathogenic E coli (EPEC) NOT DETECTED NOT DETECTED Final   Enterotoxigenic E coli (ETEC) NOT DETECTED NOT DETECTED Final   Shiga like toxin producing E coli (STEC) NOT DETECTED NOT DETECTED Final   Shigella/Enteroinvasive E  coli (EIEC) NOT DETECTED NOT DETECTED Final   Cryptosporidium NOT DETECTED NOT DETECTED Final   Cyclospora cayetanensis NOT DETECTED NOT DETECTED Final   Entamoeba histolytica NOT DETECTED NOT DETECTED Final   Giardia lamblia NOT DETECTED NOT DETECTED Final   Adenovirus F40/41 NOT DETECTED NOT DETECTED Final   Astrovirus NOT DETECTED NOT DETECTED Final   Norovirus GI/GII DETECTED (A) NOT DETECTED Final    Comment: RESULT CALLED TO, READ BACK BY AND VERIFIED WITH: RAMAN KAUR AT 1130 ON 10/07/2020 [place].    Rotavirus A NOT DETECTED NOT DETECTED Final   Sapovirus (I, II, IV, and V) NOT DETECTED NOT DETECTED Final    Comment: Performed at [facility] [place], [place], [place] [address]  C Difficile Quick Screen w PCR reflex     Status: Abnormal   Collection Time: 10/07/20  8:41 AM   Specimen: STOOL  Result Value Ref Range Status   C Diff antigen POSITIVE (A) NEGATIVE Final   C Diff toxin NEGATIVE NEGATIVE Final   C Diff interpretation Results are indeterminate. See PCR results.  Final    Comment: Performed at [facility], [address], [place], [place] [address]  C. Diff by PCR, Reflexed     Status: Abnormal   Collection Time: 10/07/20  8:41 AM  Result Value Ref Range Status   Toxigenic C. Difficile by PCR POSITIVE (A) NEGATIVE Final    Comment: Positive for toxigenic C. difficile with little to no toxin production. Only treat if clinical presentation suggests symptomatic illness. Performed at [facility], [address] [place], [place] [address]     Coagulation Studies: No results for input(s): LABPROT, INR in the last 72 hours.  Urinalysis: Recent Labs    10/06/20 2320  COLORURINE YELLOW*  LABSPEC 1.014  PHURINE 5.0  GLUCOSEU NEGATIVE  HGBUR SMALL*  BILIRUBINUR NEGATIVE  KETONESUR NEGATIVE  PROTEINUR 100*  NITRITE NEGATIVE  LEUKOCYTESUR NEGATIVE      Imaging: US ABDOMEN LIMITED RUQ (LIVER/GB)  Result Date: 10/06/2020 CLINICAL  DATA:  Right upper quadrant pain, nausea, vomiting, diarrhea EXAM: ULTRASOUND ABDOMEN LIMITED RIGHT UPPER QUADRANT COMPARISON:  CT renal 08/21/2018 FINDINGS: Gallbladder: No gallstones or wall thickening visualized. No sonographic Murphy sign noted by sonographer. Common bile duct: Diameter: 4 mm Liver: No focal lesion identified. Increased parenchymal echogenicity. Portal vein is patent on color Doppler imaging with normal direction of blood flow towards the liver. Other: None. IMPRESSION: Hepatic steatosis. Please note limited evaluation for focal hepatic masses in a patient with hepatic steatosis due to decreased penetration of the acoustic ultrasound waves. Electronically Signed   By: Iven Finn M.D.   On: 10/06/2020 18:02     Medications:     . Chlorhexidine Gluconate Cloth  6 each Topical Q0600  . diltiazem  60 mg Oral BID  . heparin  5,000 Units Subcutaneous Q8H  . heparin sodium (porcine)  1,000 Units Intravenous UD  . potassium chloride  40 mEq Oral Q4H  . sodium chloride flush  3 mL Intravenous Q12H  . vancomycin  125 mg Oral QID   acetaminophen **OR** acetaminophen, famotidine, loperamide, ondansetron (ZOFRAN) IV  Assessment/ Plan:  55 y.o. female with  was admitted on 10/06/2020 for  Principal Problem:   Gastroenteritis Active Problems:   Essential hypertension   ESRD on dialysis ([place])   Clostridioides difficile diarrhea   Norovirus  Dehydration [E86.0] Hypokalemia [E87.6] Gastroenteritis [K52.9] Nausea [R11.0] Generalized abdominal pain [R10.84] RUQ pain [R10.11] ESRD (end stage renal disease) ([place]) [N18.6]   [facility]TS/ LUE AVF/95.5kg  #. ESRD on HD - Will continue outpatient scheduling - Received dialysis yesterday - No UF due to diarrhea or poor intake  #. Anemia of CKD  Lab Results  Component Value Date   HGB 10.7 (L) 10/07/2020  Auryxia outptient Low dose EPO with HD  #. Secondary hyperparathyroidism of renal origin N 25.81   No  results found for: PTH Lab Results  Component Value Date   PHOS 4.9 (H) 10/07/2020  Calcitriol outpatient Monitor calcium and phos level during this admission  # Gastroenteritis - Cdiff/Norovirus positive - Vancomycin Advance to solids before discharge -Explained to patient that her condition is hightly contagious and to maintain proper handwashing.   LOS: 0 Colon Fl[place] 4/8/202210:32 [place], [place]

## 2020-10-08 NOTE — Discharge Instructions (Signed)
Norovirus Infection Norovirus infection causes inflammation in the stomach and intestines (gastroenteritis) and food poisoning. It is caused by exposure to a virus from a group of similar viruses called noroviruses. Norovirus spreads very easily from person to person (is very contagious). It often occurs in places where people are in close contact, such as schools, nursing homes, and cruise ships. You can get it from food, water, surfaces, or other people who have the virus. Norovirus is also found in the stool (feces) or vomit of infected people. You can spread the infection as soon as you feel sick, and you may continue to be contagious after you recover. What are the causes? This condition is caused by contact with norovirus. You can catch norovirus if you:  Eat or drink something that is contaminated with norovirus.  Touch surfaces or objects that are contaminated with norovirus and then put your hand in or by your mouth or nose.  Have direct contact with an infected person who has symptoms.  Share food, drink, or utensils with someone who is sick with norovirus. What are the signs or symptoms? Symptoms usually begin within 12 hours to 2 days after you become infected. Most norovirus symptoms affect the digestive system.Symptoms may include:  Nausea, vomiting, and diarrhea.  Stomach cramps.  Fever.  Chills.  Headache.  Muscle aches and tiredness. How is this diagnosed? This condition may be diagnosed based on:  Your symptoms.  A physical exam.  A stool test. How is this treated? There is no specific treatment for norovirus. Most people get better without treatment in about 2 days. Young children, the elderly, and people who are already sick may take up to 6 days to recover. Follow these instructions at home: Eating and drinking  Drink plenty of water to replace fluids that are lost through diarrhea and vomiting. This prevents dehydration. Drink enough fluid to keep your  urine clear or pale yellow.  Drink clear fluids in small amounts as you are able. Clear fluids include water, ice chips, fruit juice with water added (diluted fruit juice), and low-calorie sports drinks. ? Avoid fluids that contain a lot of sugar or caffeine, such as energy drinks, sports drinks, and soda. ? Avoid alcohol.  If instructed by your health care provider, drink an oral rehydration solution (ORS). This is a drink that is sold at pharmacies and retail stores. An ORS contains minerals (electrolytes) that you can lose through diarrhea and vomiting.  Eat bland, easy-to-digest foods in small amounts as you are able. These foods include bananas, applesauce, rice, lean meats, toast, and crackers. ? Avoid spicy or fatty foods.   General instructions  Rest at home while you recover.  Do not prepare food for others while you are infected. Wait at least 3 days after you recover from the illness to do this.  Take over-the-counter and prescription medicines only as told by your health care provider.  Wash your hands frequently with soap and water. If soap and water are not available, use hand sanitizer.  Make sure that all people in your household wash their hands well and often.  Keep all follow-up visits as told by your health care provider. This is important.   How is this prevented? To help prevent the spread of norovirus:  Stay at home if you are feeling sick. This will reduce the risk of spreading the virus to others.  Wash your hands often with soap and water for at least 20 seconds, especially after using the toilet   or changing a diaper.  Wash fruits and vegetables thoroughly before peeling, preparing, or serving them.  Throw out any food that a sick person may have touched.  Disinfect contaminated surfaces immediately after someone in the household has been sick. Use a bleach-based household cleaner.  Immediately remove and wash soiled clothes or sheets. Contact a health  care provider if:  You have vomiting, diarrhea, or stomach pain that gets worse.  You have symptoms that do not go away after 3-6 days.  You have a fever.  You cannot drink without vomiting.  You feel light-headed or dizzy.  Your symptoms get worse. Get help right away if: You develop symptoms of dehydration that do not improve with fluid replacement, such as:  Excessive sleepiness.  Lack of tears.  Very little urine production.  Dry mouth.  Muscle cramps.  Weak pulse.  Confusion. Summary  Norovirus infection is common and often occurs in places where people are in close contact, such as schools, nursing homes, and cruise ships.  To help prevent the spread of this infection, wash hands with soap and water for at least 20 seconds before handling food or after having contact with stool or body fluids.  There is no specific treatment for norovirus, but most people get better without treatment in about 2 days. People who are healthy when infected often recover sooner than those who are elderly, young, or already sick.  Replace lost fluids by drinking plenty of water, or by drinking oral rehydration solution (ORS), which contains important minerals called electrolytes. This prevents dehydration. This information is not intended to replace advice given to you by your health care provider. Make sure you discuss any questions you have with your health care provider. Document Revised: 02/20/2020 Document Reviewed: 02/20/2020 Elsevier Patient Education  2021 [place]. https://www.cdc.gov/cdiff/index.html">  Clostridioides Difficile Infection Clostridioides difficile infection, also known as C. difficile or C. diff infection, happens when too much C. diff bacteria grows. This can cause severe diarrhea and inflammation of the colon (colitis). It is linked to recent use of antibiotic medicine. This infection can be passed from person to person (is contagious). You also may be  exposed to the bacteria from contact with food, water, or surfaces that have the bacteria on them. What are the causes? Certain bacteria live in the colon and help to digest food. This infection develops when the balance of helpful bacteria in the colon changes and the C. diff bacteria grow out of control. This is often caused by taking antibiotics. What increases the risk? You may be more likely to develop this condition if you:  Take certain antibiotics that kill many types of bacteria or take antibiotics for a long time.  Have an extended stay in a hospital or long-term care facility.  Are older than age 68.  Have had a C. diff infection before or have been exposed to C. diff bacteria.  Have a weakened disease-fighting system (immune system).  Take a medicine to reduce stomach acid, such as a proton pump inhibitor, for a long time.  Have a serious underlying condition, such as colon cancer or inflammatory bowel disease (IBD).  Have had a gastrointestinal (GI) tract procedure. What are the signs or symptoms? Symptoms of this condition include:  Diarrhea (three or more times a day) for several days.  Fever.  Loss of appetite.  Nausea.  Swelling, pain, cramping, or tenderness in the abdomen. How is this diagnosed? This condition is diagnosed with:  Your medical history and  a physical exam.  Tests, which may include: ? A test for C. diff in your stool (feces). ? Blood tests. ? Imaging tests, such as a CT scan of your abdomen.  A procedure in which your colon is examined. This is rare. How is this treated? Treatment for this condition may include:  Stopping the antibiotics that you were taking when the C. diff infection began. Do this only as told by your health care provider.  Taking certain antibiotics to stop C. diff growth.  Taking stool from a healthy person and placing it into your colon (fecal transplant). This may be done if the infection keeps coming  back.  Having surgery to remove the infected part of the colon. This is rare. Follow these instructions at home: Medicines  Take over-the-counter and prescription medicines only as told by your health care provider.  Take antibiotic medicine as told by your health care provider. Do not stop taking the antibiotic even if you start to feel better.  Do not treat diarrhea with medicines unless your health care provider tells you to. Eating and drinking  Follow instructions from your health care provider about eating and drinking restrictions.  Eat bland foods in small amounts that are easy to digest. These include bananas, applesauce, rice, lean meats, toast, and crackers.  Follow instructions on replacing body fluid that has been lost (rehydrate). This may include: ? Drinking clear fluids, such as water, clear fruit juice that is diluted with water, and low-calorie sports drinks. ? Sucking on ice chips. ? Taking an oral rehydration solution (ORS). This drink is sold at pharmacies and retail stores.  Avoid milk, caffeine, and alcohol.  Drink enough fluid to keep your urine pale yellow.   General instructions  Wash your hands often with soap and water for at least 20 seconds. Bathe or shower using soap and water daily.  Return to your normal activities as told by your health care provider. Ask your health care provider what activities are safe for you.  Be sure your home is clean before you leave the hospital or clinic to go home. Then continue daily cleaning for at least a week.  Keep all follow-up visits. This is important. How is this prevented? Hand hygiene  Wash your hands with soap and water for at least 20 seconds before preparing food and after using the bathroom. Make sure the people you live with also wash their hands often with soap and water for at least 20 seconds.  If you are being treated at a hospital or clinic, make sure that all health care providers and visitors  wash their hands with soap and water before touching you.   Contact precautions  Tell your health care team right away if you develop diarrhea while in a hospital or long-term care facility.  When visiting someone in a hospital or a long-term care facility, follow guidelines for wearing a gown, gloves, or other protective equipment.  If possible, avoid contact with people who have diarrhea.  Use a separate bathroom if you are sick and live with other people, if possible. Clean environment  Clean surfaces that are touched often every day. C. diff bacteria are killed only by cleaning products that contain 10% chlorine bleach solution. Be sure to: ? Read the product's label to make sure the product will kill the bacteria on the surface you are cleaning. ? Clean frequently touched surfaces, such as toilet seats and flush handles, bathtubs, sinks, doorknobs, and work surfaces.  If  you are in the hospital, make sure that staff members clean the surfaces in your room daily. Let a staff person know right away if body fluids have splashed or spilled. Washing clothes and linens  Use a powder laundry detergent containing chlorine bleach instead of liquid detergent. Powder detergents contain chlorine bleach in low levels to help kill bacteria.  Run your empty washing machine on the hot setting once a month with enough detergent for a full load. This will kill any remaining C. diff bacteria. Contact a health care provider if:  Your symptoms do not get better, or they get worse, even with treatment.  Your symptoms go away and then come back.  You have a fever.  You develop new symptoms. Get help right away if:  You have more pain or tenderness in your abdomen.  You have stool that is mostly bloody, or looks black and tarry.  You cannot eat or drink without vomiting.  You have signs of dehydration, such as: ? Dark urine, very little urine, or no urine. ? Cracked lips or dry mouth. ? Not  making tears when you cry. ? Sunken eyes. ? Sleepiness. ? Weakness or dizziness. Summary  Clostridioides difficile infection, or C. diff infection, can cause severe diarrhea and inflammation of the colon (colitis). It is linked to recent antibiotic use.  C. diff infection can spread from person to person (is contagious). You also may be exposed to the bacteria from contact with food, water, or surfaces that have the bacteria on them.  This infection may be treated by stopping the antibiotics you were using when the infection began. Fecal transplant or surgery may be needed for repeat or severe infections.  Washing hands with soap and water for at least 20 seconds after you use the bathroom and before you eat, and cleaning surfaces with a 10% bleach solution, can help prevent or limit spread of this infection. This information is not intended to replace advice given to you by your health care provider. Make sure you discuss any questions you have with your health care provider. Document Revised: 10/09/2019 Document Reviewed: 10/09/2019 Elsevier Patient Education  [place].

## 2020-11-09 ENCOUNTER — Other Ambulatory Visit (INDEPENDENT_AMBULATORY_CARE_PROVIDER_SITE_OTHER): Payer: Self-pay | Admitting: Nurse Practitioner

## 2020-11-09 DIAGNOSIS — N186 End stage renal disease: Secondary | ICD-10-CM

## 2020-11-10 ENCOUNTER — Ambulatory Visit (INDEPENDENT_AMBULATORY_CARE_PROVIDER_SITE_OTHER): Payer: BC Managed Care – PPO | Admitting: Nurse Practitioner

## 2020-11-10 ENCOUNTER — Ambulatory Visit (INDEPENDENT_AMBULATORY_CARE_PROVIDER_SITE_OTHER): Payer: BC Managed Care – PPO

## 2020-11-10 ENCOUNTER — Other Ambulatory Visit: Payer: Self-pay

## 2020-11-10 ENCOUNTER — Encounter (INDEPENDENT_AMBULATORY_CARE_PROVIDER_SITE_OTHER): Payer: Self-pay | Admitting: Nurse Practitioner

## 2020-11-10 VITALS — BP 144/73 | HR 60 | Resp 16 | Ht 63.0 in | Wt 214.0 lb

## 2020-11-10 DIAGNOSIS — I10 Essential (primary) hypertension: Secondary | ICD-10-CM | POA: Diagnosis not present

## 2020-11-10 DIAGNOSIS — E785 Hyperlipidemia, unspecified: Secondary | ICD-10-CM | POA: Diagnosis not present

## 2020-11-10 DIAGNOSIS — I499 Cardiac arrhythmia, unspecified: Secondary | ICD-10-CM

## 2020-11-10 DIAGNOSIS — N186 End stage renal disease: Secondary | ICD-10-CM | POA: Diagnosis not present

## 2020-11-16 ENCOUNTER — Encounter (INDEPENDENT_AMBULATORY_CARE_PROVIDER_SITE_OTHER): Payer: Self-pay | Admitting: Nurse Practitioner

## 2020-11-16 NOTE — Progress Notes (Signed)
Subjective:    Patient ID: Jennifer Newman, female    DOB: 05-Jul-1965, 55 y.o.   MRN: [id number] Chief Complaint  Patient presents with  . Follow-up    ultrasound    The patient returns to the office for followup of their dialysis access. The function of the access has been stable. The patient denies increased bleeding time or increased recirculation. Patient denies difficulty with cannulation. The patient denies hand pain or other symptoms consistent with steal phenomena.  No significant arm swelling.  The patient denies redness or swelling at the access site. The patient denies fever or chills at home or while on dialysis.  The patient denies amaurosis fugax or recent TIA symptoms. There are no recent neurological changes noted. The patient denies claudication symptoms or rest pain symptoms. The patient denies history of DVT, PE or superficial thrombophlebitis. The patient denies recent episodes of angina or shortness of breath.     Today the patient has a flow volume of of 1889.  The fistula is patent.  This flow volume is slightly improved from her previous office visit.   Review of Systems  Respiratory: Negative for shortness of breath.   All other systems reviewed and are negative.      Objective:   Physical Exam Vitals reviewed.  HENT:     Head: Normocephalic.  Cardiovascular:     Rate and Rhythm: Normal rate. Rhythm irregularly irregular.     Pulses: Normal pulses.     Heart sounds: Normal heart sounds.     Arteriovenous access: left arteriovenous access is present.    Comments: Good thrill and bruit Pulmonary:     Effort: Pulmonary effort is normal.     Breath sounds: Normal breath sounds.  Neurological:     Mental Status: She is alert and oriented to person, place, and time.  Psychiatric:        Mood and Affect: Mood normal.        Behavior: Behavior normal.        Thought Content: Thought content normal.        Judgment: Judgment normal.     BP (!)  144/73 (BP Location: Right Arm)   Pulse 60   Resp 16   Ht 5\' 3"  (1.6 m)   Wt 214 lb (97.1 kg)   LMP 08/31/2018   BMI 37.91 kg/m   Past Medical History:  Diagnosis Date  . Anxiety   . Bipolar disorder ([place])   . Chronic kidney disease   . Depression   . Dysrhythmia    tachycardia  . GERD (gastroesophageal reflux disease)   . Hypertension   . Obsessive-compulsive disorder   . PTSD (post-traumatic stress disorder)   . Tachycardia, unspecified   . Vertigo     Social History   Socioeconomic History  . Marital status: Married    Spouse name: Corene Cornea  . Number of children: 2  . Years of education: Not on file  . Highest education level: Associate degree: occupational, Hotel manager, or vocational program  Occupational History  . 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 Smoking status: Never Smoker  . Smokeless tobacco: Never Used  Vaping Use  . Vaping Use: Never used  Substance and Sexual Activity  . Alcohol use: Not Currently    Alcohol/week: 0.0 standard drinks  . Drug use: Not Currently    Types: Marijuana    Comment: > 72yrs  . Sexual activity:  Not Currently  Other Topics Concern  . Not on file  Social History Narrative  . Not on file   Social Determinants of Health   Financial Resource Strain: Not on file  Food Insecurity: Not on file  Transportation Needs: Not on file  Physical Activity: Not on file  Stress: Not on file  Social Connections: Not on file  Intimate Partner Violence: Not on file    Past Surgical History:  Procedure Laterality Date  . A/V FISTULAGRAM Left 06/30/2019   Procedure: A/V FISTULAGRAM;  Surgeon: Algernon Huxley, MD;  Location: [facility];  Service: Cardiovascular;  Laterality: Left;  . AV FISTULA PLACEMENT Left 02/26/2019   Procedure: ARTERIOVENOUS (AV) FISTULA CREATION ( RADIAL CEPHALIC );  Surgeon: Algernon Huxley, MD;  Location: [facility];  Service: Vascular;  Laterality:  Left;  . AV FISTULA PLACEMENT Left 04/16/2019   Procedure: ARTERIOVENOUS (AV) FISTULA CREATION ( BRACHIAL CEPHALIC );  Surgeon: Algernon Huxley, MD;  Location: [facility];  Service: Vascular;  Laterality: Left;  . DIALYSIS/PERMA CATHETER INSERTION N/A 08/22/2018   Procedure: DIALYSIS temporary catheter INSERTION;  Surgeon: Algernon Huxley, MD;  Location: [facility];  Service: Cardiovascular;  Laterality: N/A;  . DIALYSIS/PERMA CATHETER INSERTION N/A 08/26/2018   Procedure: DIALYSIS/PERMA CATHETER INSERTION;  Surgeon: Algernon Huxley, MD;  Location: [facility];  Service: Cardiovascular;  Laterality: N/A;  . DIALYSIS/PERMA CATHETER INSERTION N/A 01/01/2019   Procedure: DIALYSIS/PERMA CATHETER INSERTION;  Surgeon: Algernon Huxley, MD;  Location: [facility];  Service: Cardiovascular;  Laterality: N/A;  . DIALYSIS/PERMA CATHETER REMOVAL N/A 12/29/2019   Procedure: DIALYSIS/PERMA CATHETER REMOVAL;  Surgeon: Algernon Huxley, MD;  Location: [facility];  Service: Cardiovascular;  Laterality: N/A;  . FISTULA SUPERFICIALIZATION Left 10/29/2019   Procedure: FISTULA SUPERFICIALIZATION;  Surgeon: Algernon Huxley, MD;  Location: [facility];  Service: Vascular;  Laterality: Left;  . none    . PERIPHERAL VASCULAR THROMBECTOMY Left 03/12/2019   Procedure: PERIPHERAL VASCULAR THROMBECTOMY;  Surgeon: Algernon Huxley, MD;  Location: [facility];  Service: Cardiovascular;  Laterality: Left;  . PERIPHERAL VASCULAR THROMBECTOMY Left 03/24/2020   Procedure: PERIPHERAL VASCULAR THROMBECTOMY;  Surgeon: Algernon Huxley, MD;  Location: [facility];  Service: Cardiovascular;  Laterality: Left;    Family History  Problem Relation Age of Onset  . COPD Neg Hx   . Diabetes Neg Hx   . Hypertension Neg Hx   . CAD Neg Hx     No Known Allergies  CBC Latest Ref Rng & Units 10/07/2020 10/06/2020 10/29/2019  WBC 4.0 - 10.5 K/uL 5.8 9.9 -  Hemoglobin 12.0 - 15.0 g/dL 10.7(L) 13.5 12.6  Hematocrit 36.0 - 46.0  % 32.0(L) 39.5 37.0  Platelets 150 - 400 K/uL 205 271 -      CMP     Component Value Date/Time   NA 136 10/08/2020 0448   NA 135 (L) 10/09/2013 1548   K 3.1 (L) 10/08/2020 0448   K 3.6 10/09/2013 1548   CL 99 10/08/2020 0448   CL 106 10/09/2013 1548   CO2 27 10/08/2020 0448   CO2 23 10/09/2013 1548   GLUCOSE 88 10/08/2020 0448   GLUCOSE 84 10/09/2013 1548   BUN 21 (H) 10/08/2020 0448   BUN 16 10/09/2013 1548   CREATININE 2.22 (H) 10/08/2020 0448   CREATININE 0.60 10/09/2013 1548   CALCIUM 8.9 10/08/2020 0448   CALCIUM 8.9 10/09/2013 1548  PROT 8.6 (H) 10/06/2020 1322   PROT 8.6 (H) 10/09/2013 1548   ALBUMIN 3.3 (L) 10/07/2020 0617   ALBUMIN 4.0 10/09/2013 1548   AST 34 10/06/2020 1322   AST 36 10/09/2013 1548   ALT 29 10/06/2020 1322   ALT 38 10/09/2013 1548   ALKPHOS 197 (H) 10/06/2020 1322   ALKPHOS 118 (H) 10/09/2013 1548   BILITOT 0.7 10/06/2020 1322   BILITOT 0.3 10/09/2013 1548   GFRNONAA 26 (L) 10/08/2020 0448   GFRNONAA >60 10/09/2013 1548   GFRAA 15 (L) 10/20/2019 1200   GFRAA >60 10/09/2013 1548     No results found.     Assessment & Plan:   1. Cardiac arrhythmia, unspecified cardiac arrhythmia type During physical examination patient had evidence of an arrhythmia.  This arrhythmia was irregularly irregular.  The patient notes that she does have an arrhythmia however she is not sure of which it is.  She had a treated before at an outside hospital not in this area.  She notes that a doctor told her was that because of this she would likely need a pacemaker 1 day.  This has not been followed by any cardiologist.  The patient is referred to cardiology today for further work-up and possible management of arrhythmia. - Ambulatory referral to Cardiology  2. ESRD (end stage renal disease) ([place]) Recommend:  The patient is doing well and currently has adequate dialysis access. The patient's dialysis center is not reporting any access issues. Flow pattern is  stable when compared to the prior ultrasound.  The patient should have a duplex ultrasound of the dialysis access in 6 months. The patient will follow-up with me in the office after each ultrasound     3. Essential hypertension Continue antihypertensive medications as already ordered, these medications have been reviewed and there are no changes at this time.   4. Hyperlipidemia, unspecified hyperlipidemia type Continue statin as ordered and reviewed, no changes at this time    Current Outpatient Medications on File Prior to Visit  Medication Sig Dispense Refill  . diltiazem (CARDIZEM SR) 60 MG 12 hr capsule Take 1 capsule (60 mg total) by mouth 2 (two) times daily. (Patient not taking: Reported on 11/10/2020) 180 capsule 0  . famotidine (PEPCID) 10 MG tablet Take 10 mg by mouth daily as needed for heartburn or indigestion. (Patient not taking: Reported on 11/10/2020)    . losartan (COZAAR) 25 MG tablet Take 25 mg by mouth daily. (Patient not taking: Reported on 11/10/2020)    . sertraline (ZOLOFT) 25 MG tablet Take 1 tablet by mouth 1 day or 1 dose.     No current facility-administered medications on file prior to visit.    There are no Patient Instructions on file for this visit. No follow-ups on file.   Kris Hartmann, NP

## 2020-12-24 ENCOUNTER — Other Ambulatory Visit: Payer: Self-pay

## 2020-12-24 ENCOUNTER — Ambulatory Visit (INDEPENDENT_AMBULATORY_CARE_PROVIDER_SITE_OTHER): Payer: BC Managed Care – PPO | Admitting: Cardiology

## 2020-12-24 ENCOUNTER — Encounter: Payer: Self-pay | Admitting: Cardiology

## 2020-12-24 ENCOUNTER — Ambulatory Visit (INDEPENDENT_AMBULATORY_CARE_PROVIDER_SITE_OTHER): Payer: BC Managed Care – PPO

## 2020-12-24 VITALS — BP 112/88 | HR 86 | Ht 63.0 in | Wt 212.0 lb

## 2020-12-24 DIAGNOSIS — R000 Tachycardia, unspecified: Secondary | ICD-10-CM

## 2020-12-24 DIAGNOSIS — I10 Essential (primary) hypertension: Secondary | ICD-10-CM | POA: Diagnosis not present

## 2020-12-24 NOTE — Progress Notes (Signed)
Cardiology Office Note:    Date:  12/24/2020   ID:  Jennifer Newman, DOB [birth year], MRN [id number]  PCP:  Barbaraann Boys, MD   [place] Providers Cardiologist:  Kate Sable, MD     Referring MD: Kris Hartmann, NP   Chief Complaint  Patient presents with   New Patient (Initial Visit)    Referred by Eulogio Ditch, NP for Cardiac Arrhythmia  Pt denies cardiac Sx.    History of Present Illness:    Jennifer Newman is a 55 y.o. female with a hx of ESRD on HD T Th S, hypertension, presenting due to fast heart rates.  She states having increased heart rates during dialysis sessions.  She denies chest pain, shortness of breath, palpitations.  She was told by dialysis nurse that her heart rates are elevated.  Advised to get checked out.  She states being told 4 years ago that she may need a pacemaker in the future.  She denies any history of heart disease, denies any family history of heart disease.  Denies dizziness, syncope.  Used to be on Cardizem for elevated heart rates but she does not want to take any medications.  Stop BP medications, BP has been controlled off medicines.  Irregular heartbeat.  Saw primary care provider last month 11/10/2020 where irregular heartbeat was noted on exam.  Echocardiogram 12/2016 showed normal systolic function, EF 55 to 60%.  Past Medical History:  Diagnosis Date   Anxiety    Bipolar disorder ([place])    Chronic kidney disease    Depression    Dysrhythmia    tachycardia   GERD (gastroesophageal reflux disease)    Hypertension    Obsessive-compulsive disorder    PTSD (post-traumatic stress disorder)    Tachycardia, unspecified    Vertigo     Past Surgical History:  Procedure Laterality Date   A/V FISTULAGRAM Left 06/30/2019   Procedure: A/V FISTULAGRAM;  Surgeon: Algernon Huxley, MD;  Location: [place] [facility];  Service: Cardiovascular;  Laterality: Left;   AV FISTULA PLACEMENT Left 02/26/2019   Procedure: ARTERIOVENOUS (AV)  FISTULA CREATION ( RADIAL CEPHALIC );  Surgeon: Algernon Huxley, MD;  Location: [facility];  Service: Vascular;  Laterality: Left;   AV FISTULA PLACEMENT Left 04/16/2019   Procedure: ARTERIOVENOUS (AV) FISTULA CREATION ( BRACHIAL CEPHALIC );  Surgeon: Algernon Huxley, MD;  Location: [facility];  Service: Vascular;  Laterality: Left;   DIALYSIS/PERMA CATHETER INSERTION N/A 08/22/2018   Procedure: DIALYSIS temporary catheter INSERTION;  Surgeon: Algernon Huxley, MD;  Location: [place] [facility];  Service: Cardiovascular;  Laterality: N/A;   DIALYSIS/PERMA CATHETER INSERTION N/A 08/26/2018   Procedure: DIALYSIS/PERMA CATHETER INSERTION;  Surgeon: Algernon Huxley, MD;  Location: [place] [facility];  Service: Cardiovascular;  Laterality: N/A;   DIALYSIS/PERMA CATHETER INSERTION N/A 01/01/2019   Procedure: DIALYSIS/PERMA CATHETER INSERTION;  Surgeon: Algernon Huxley, MD;  Location: [place] [facility];  Service: Cardiovascular;  Laterality: N/A;   DIALYSIS/PERMA CATHETER REMOVAL N/A 12/29/2019   Procedure: DIALYSIS/PERMA CATHETER REMOVAL;  Surgeon: Algernon Huxley, MD;  Location: [place] [facility];  Service: Cardiovascular;  Laterality: N/A;   FISTULA SUPERFICIALIZATION Left 10/29/2019   Procedure: FISTULA SUPERFICIALIZATION;  Surgeon: Algernon Huxley, MD;  Location: [facility];  Service: Vascular;  Laterality: Left;   none     PERIPHERAL VASCULAR THROMBECTOMY Left 03/12/2019   Procedure: PERIPHERAL VASCULAR THROMBECTOMY;  Surgeon: Algernon Huxley, MD;  Location: [place] [facility];  Service: Cardiovascular;  Laterality: Left;   PERIPHERAL VASCULAR THROMBECTOMY Left 03/24/2020   Procedure: PERIPHERAL VASCULAR THROMBECTOMY;  Surgeon: Algernon Huxley, MD;  Location: [place] [facility];  Service: Cardiovascular;  Laterality: Left;    Current Medications: No outpatient medications have been marked as taking for the 12/24/20 encounter (Office Visit) with Kate Sable, MD.     Allergies:   Patient has no known allergies.    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    Comment: not employed  Tobacco Use   Smoking status: Never   Smokeless tobacco: Never  Vaping Use   Vaping Use: Never used  Substance and Sexual Activity   Alcohol use: Not Currently    Alcohol/week: 0.0 standard drinks   Drug use: Not Currently    Types: Marijuana    Comment: > 89yrs   Sexual activity: Not Currently  Other Topics Concern   Not on file  Social History Narrative   Not on file   Social Determinants of Health   Financial Resource Strain: Not on file  Food Insecurity: Not on file  Transportation Needs: Not on file  Physical Activity: Not on file  Stress: Not on file  Social Connections: Not on file     Family History: The patient's family history is negative for COPD, Diabetes, Hypertension, and CAD.  ROS:   Please see the history of present illness.     All other systems reviewed and are negative.  EKGs/Labs/Other Studies Reviewed:    The following studies were reviewed today:   EKG:  EKG is  ordered today.  The ekg ordered today demonstrates sinus rhythm, PACs, old septal infarct  Recent Labs: 10/06/2020: ALT 29; Magnesium 2.4 10/07/2020: Hemoglobin 10.7; Platelets 205 10/08/2020: BUN 21; Creatinine, Ser 2.22; Potassium 3.1; Sodium 136  Recent Lipid Panel No results found for: CHOL, TRIG, HDL, CHOLHDL, VLDL, LDLCALC, LDLDIRECT   Risk Assessment/Calculations:          Physical Exam:    VS:  BP 112/88   Pulse 86   Ht 5\' 3"  (1.6 m)   Wt 212 lb (96.2 kg)   LMP 08/31/2018   BMI 37.55 kg/m     Wt Readings from Last 3 Encounters:  12/24/20 212 lb (96.2 kg)  11/10/20 214 lb (97.1 kg)  10/08/20 223 lb 8.7 oz (101.4 kg)      GEN:  Well nourished, well developed in no acute distress HEENT: Normal NECK: No JVD; No carotid bruits LYMPHATICS: No lymphadenopathy CARDIAC: RRR, continuous murmur from AV fistula auscultated RESPIRATORY:  Clear to auscultation without rales, wheezing or rhonchi  ABDOMEN: Soft, non-tender, non-distended MUSCULOSKELETAL:  No edema; No deformity  SKIN: Warm and dry NEUROLOGIC:  Alert and oriented x 3 PSYCHIATRIC:  Normal affect   ASSESSMENT:    1. Tachycardia   2. Primary hypertension    PLAN:    In order of problems listed above:  Tachycardia noted during dialysis, this is slightly hypotension induced elevated heart rates from fluid withdrawal.  Patient has low cardiac symptoms.  We will place a cardiac monitor to evaluate any significant arrhythmias or confirm sinus tach. History of hypertension, BP control of BP meds.  Follow-up after cardiac monitor.  Medication Adjustments/Labs and Tests Ordered: Current medicines are reviewed at length with the patient today.  Concerns regarding medicines are outlined above.  Orders Placed This Encounter  Procedures   LONG TERM MONITOR (3-14 DAYS)   EKG 12-Lead   No orders of the defined types were placed in this encounter.   Patient Instructions  Medication Instructions:  Your physician recommends that you continue on your current medications as directed. Please refer to the Current Medication list given to you today.  *If you need a refill on your cardiac medications before your next appointment, please call your pharmacy*   Lab Work: None ordered If you have labs (blood work) drawn today and your tests are completely normal, you will receive your results only by: [place] (if you have MyChart) OR A paper copy in the mail If you have any lab test that is abnormal or we need to change your treatment, we will call you to review the results.   Testing/Procedures:  Your physician has recommended that you wear  a Zio XT monitor for 2 weeks.   This monitor is a medical device that records the heart's electrical activity. Doctors most often use these monitors to diagnose arrhythmias. Arrhythmias are problems with the speed or rhythm of the heartbeat. The monitor is a small device applied to your chest. You can wear one while you do your normal daily activities. While wearing this monitor if you have any symptoms to push the button and record what you felt. Once you have worn this monitor for the period of time provider prescribed (Usually 14 days), you will return the monitor device in the postage paid box. Once it is returned they will download the data collected and provide [place] with a report which the provider will then review and we will call you with those results. Important tips:  Avoid showering during the first 24 hours of wearing the monitor. Avoid excessive sweating to help maximize wear time. Do not submerge the device, no hot tubs, and no swimming pools. Keep any lotions or oils away from the patch. After 24 hours you may shower with the patch on. Take brief showers with your back facing the shower head.  Do not remove patch once it has been placed because that will interrupt data and decrease adhesive wear time. Push the button when you have any symptoms and write down what you were feeling. Once you have completed wearing your monitor, remove and place into box which has postage paid and place in your outgoing mailbox.  If for some reason you have misplaced your box then call our office and we can provide another box and/or mail it off for you.      Follow-Up: At [facility], you and your health needs are our priority.  As part of our continuing mission to provide you with exceptional heart care, we have created designated Provider Care Teams.  These Care Teams include your primary Cardiologist (physician) and Advanced Practice Providers (APPs -  Physician Assistants and Nurse Practitioners)  who all work together to provide you with the care you need, when you need it.  We recommend signing up for the patient portal called "MyChart".  Sign up information is provided on this After Visit Summary.  MyChart is used to connect with patients for Virtual Visits (Telemedicine).  Patients are able to view lab/test results, encounter notes, upcoming appointments, etc.  Non-urgent messages can be sent to your provider as well.   To learn more  about what you can do with MyChart, go to [email or web address].    Your next appointment:   6 week(s)  The format for your next appointment:   In Person  Provider:   Kate Sable, MD   Other Instructions    Signed, Kate Sable, MD  12/24/2020 12:58 PM    [place]

## 2020-12-24 NOTE — Patient Instructions (Signed)
Medication Instructions:  Your physician recommends that you continue on your current medications as directed. Please refer to the Current Medication list given to you today.  *If you need a refill on your cardiac medications before your next appointment, please call your pharmacy*   Lab Work: None ordered If you have labs (blood work) drawn today and your tests are completely normal, you will receive your results only by: MyChart Message (if you have MyChart) OR A paper copy in the mail If you have any lab test that is abnormal or we need to change your treatment, we will call you to review the results.   Testing/Procedures:  Your physician has recommended that you wear a Zio XT monitor for 2 weeks   This monitor is a medical device that records the heart's electrical activity. Doctors most often use these monitors to diagnose arrhythmias. Arrhythmias are problems with the speed or rhythm of the heartbeat. The monitor is a small device applied to your chest. You can wear one while you do your normal daily activities. While wearing this monitor if you have any symptoms to push the button and record what you felt. Once you have worn this monitor for the period of time provider prescribed (Usually 14 days), you will return the monitor device in the postage paid box. Once it is returned they will download the data collected and provide us with a report which the provider will then review and we will call you with those results. Important tips:  Avoid showering during the first 24 hours of wearing the monitor. Avoid excessive sweating to help maximize wear time. Do not submerge the device, no hot tubs, and no swimming pools. Keep any lotions or oils away from the patch. After 24 hours you may shower with the patch on. Take brief showers with your back facing the shower head.  Do not remove patch once it has been placed because that will interrupt data and decrease adhesive wear time. Push the  button when you have any symptoms and write down what you were feeling. Once you have completed wearing your monitor, remove and place into box which has postage paid and place in your outgoing mailbox.  If for some reason you have misplaced your box then call our office and we can provide another box and/or mail it off for you.      Follow-Up: At [facility], you and your health needs are our priority.  As part of our continuing mission to provide you with exceptional heart care, we have created designated Provider Care Teams.  These Care Teams include your primary Cardiologist (physician) and Advanced Practice Providers (APPs -  Physician Assistants and Nurse Practitioners) who all work together to provide you with the care you need, when you need it.  We recommend signing up for the patient portal called "MyChart".  Sign up information is provided on this After Visit Summary.  MyChart is used to connect with patients for Virtual Visits (Telemedicine).  Patients are able to view lab/test results, encounter notes, upcoming appointments, etc.  Non-urgent messages can be sent to your provider as well.   To learn more about what you can do with MyChart, go to [email or web address].    Your next appointment:   6 week(s)  The format for your next appointment:   In Person  Provider:   Brian Agbor-Etang, MD   Other Instructions   

## 2021-01-07 DIAGNOSIS — R000 Tachycardia, unspecified: Secondary | ICD-10-CM

## 2021-02-04 ENCOUNTER — Ambulatory Visit (INDEPENDENT_AMBULATORY_CARE_PROVIDER_SITE_OTHER): Payer: BC Managed Care – PPO | Admitting: Cardiology

## 2021-02-04 ENCOUNTER — Other Ambulatory Visit: Payer: Self-pay

## 2021-02-04 ENCOUNTER — Encounter: Payer: Self-pay | Admitting: Cardiology

## 2021-02-04 VITALS — BP 112/72 | HR 126 | Ht 63.0 in | Wt 212.0 lb

## 2021-02-04 DIAGNOSIS — I10 Essential (primary) hypertension: Secondary | ICD-10-CM | POA: Diagnosis not present

## 2021-02-04 DIAGNOSIS — I471 Supraventricular tachycardia: Secondary | ICD-10-CM

## 2021-02-04 MED ORDER — METOPROLOL SUCCINATE ER 25 MG PO TB24: 25.0000 mg | ORAL_TABLET | Freq: Every day | ORAL | 0 refills | Status: DC

## 2021-02-04 NOTE — Progress Notes (Signed)
Cardiology Office Note:    Date:  02/04/2021   ID:  Jennifer Newman, DOB [birth year], MRN [id number]  PCP:  Jennifer Boys, MD   [place] Providers Cardiologist:  Kate Sable, MD     Referring MD: Jennifer Boys, MD   Chief Complaint  Patient presents with   Other    6 week follow up post ZIO. Meds reviewed verbally with patient.      History of Present Illness:    Jennifer Newman is a 55 y.o. female with a hx of ESRD on HD T Th S, hypertension, presenting for follow-up.  She was last seen due to fast heart rates during dialysis sessions.  Denies any cardiac symptoms including shortness of breath, chest pain or palpitations.  Cardiac monitor was placed to evaluate any significant arrhythmias.  Blood pressures have been well controlled off BP meds.  She feels well, has no symptoms.  Prior notes Echocardiogram 12/2016 showed normal systolic function, EF 55 to 60%.  Past Medical History:  Diagnosis Date   Anxiety    Bipolar disorder ([place])    Chronic kidney disease    Depression    Dysrhythmia    tachycardia   GERD (gastroesophageal reflux disease)    Hypertension    Obsessive-compulsive disorder    PTSD (post-traumatic stress disorder)    Tachycardia, unspecified    Vertigo     Past Surgical History:  Procedure Laterality Date   A/V FISTULAGRAM Left 06/30/2019   Procedure: A/V FISTULAGRAM;  Surgeon: Algernon Huxley, MD;  Location: [place] [facility];  Service: Cardiovascular;  Laterality: Left;   AV FISTULA PLACEMENT Left 02/26/2019   Procedure: ARTERIOVENOUS (AV) FISTULA CREATION ( RADIAL CEPHALIC );  Surgeon: Algernon Huxley, MD;  Location: [facility];  Service: Vascular;  Laterality: Left;   AV FISTULA PLACEMENT Left 04/16/2019   Procedure: ARTERIOVENOUS (AV) FISTULA CREATION ( BRACHIAL CEPHALIC );  Surgeon: Algernon Huxley, MD;  Location: [facility];  Service: Vascular;  Laterality: Left;   DIALYSIS/PERMA CATHETER INSERTION N/A 08/22/2018   Procedure: DIALYSIS  temporary catheter INSERTION;  Surgeon: Algernon Huxley, MD;  Location: [place] [facility];  Service: Cardiovascular;  Laterality: N/A;   DIALYSIS/PERMA CATHETER INSERTION N/A 08/26/2018   Procedure: DIALYSIS/PERMA CATHETER INSERTION;  Surgeon: Algernon Huxley, MD;  Location: [place] [facility];  Service: Cardiovascular;  Laterality: N/A;   DIALYSIS/PERMA CATHETER INSERTION N/A 01/01/2019   Procedure: DIALYSIS/PERMA CATHETER INSERTION;  Surgeon: Algernon Huxley, MD;  Location: [place] [facility];  Service: Cardiovascular;  Laterality: N/A;   DIALYSIS/PERMA CATHETER REMOVAL N/A 12/29/2019   Procedure: DIALYSIS/PERMA CATHETER REMOVAL;  Surgeon: Algernon Huxley, MD;  Location: [place] [facility];  Service: Cardiovascular;  Laterality: N/A;   FISTULA SUPERFICIALIZATION Left 10/29/2019   Procedure: FISTULA SUPERFICIALIZATION;  Surgeon: Algernon Huxley, MD;  Location: [facility];  Service: Vascular;  Laterality: Left;   none     PERIPHERAL VASCULAR THROMBECTOMY Left 03/12/2019   Procedure: PERIPHERAL VASCULAR THROMBECTOMY;  Surgeon: Algernon Huxley, MD;  Location: [place] [facility];  Service: Cardiovascular;  Laterality: Left;   PERIPHERAL VASCULAR THROMBECTOMY Left 03/24/2020   Procedure: PERIPHERAL VASCULAR THROMBECTOMY;  Surgeon: Algernon Huxley, MD;  Location: [place] [facility];  Service: Cardiovascular;  Laterality: Left;    Current Medications: Current Meds  Medication Sig   metoprolol succinate (TOPROL XL) 25 MG 24 hr tablet Take 1 tablet (25 mg total) by mouth daily.     Allergies:  Patient has no known allergies.   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    Comment: not employed  Tobacco Use   Smoking status: Never    Smokeless tobacco: Never  Vaping Use   Vaping Use: Never used  Substance and Sexual Activity   Alcohol use: Not Currently    Alcohol/week: 0.0 standard drinks   Drug use: Not Currently    Types: Marijuana    Comment: > 70yrs   Sexual activity: Not Currently  Other Topics Concern   Not on file  Social History Narrative   Not on file   Social Determinants of Health   Financial Resource Strain: Not on file  Food Insecurity: Not on file  Transportation Needs: Not on file  Physical Activity: Not on file  Stress: Not on file  Social Connections: Not on file     Family History: The patient's family history is negative for COPD, Diabetes, Hypertension, and CAD.  ROS:   Please see the history of present illness.     All other systems reviewed and are negative.  EKGs/Labs/Other Studies Reviewed:    The following studies were reviewed today:   EKG:  EKG not  ordered today.    Recent Labs: 10/06/2020: ALT 29; Magnesium 2.4 10/07/2020: Hemoglobin 10.7; Platelets 205 10/08/2020: BUN 21; Creatinine, Ser 2.22; Potassium 3.1; Sodium 136  Recent Lipid Panel No results found for: CHOL, TRIG, HDL, CHOLHDL, VLDL, LDLCALC, LDLDIRECT   Risk Assessment/Calculations:          Physical Exam:    VS:  BP 112/72 (BP Location: Right Arm, Patient Position: Sitting, Cuff Size: Large)   Pulse (!) 126   Ht 5\' 3"  (1.6 m)   Wt 212 lb (96.2 kg)   LMP 08/31/2018   SpO2 96%   BMI 37.55 kg/m     Wt Readings from Last 3 Encounters:  02/04/21 212 lb (96.2 kg)  12/24/20 212 lb (96.2 kg)  11/10/20 214 lb (97.1 kg)     GEN:  Well nourished, well developed in no acute distress HEENT: Normal NECK: No JVD; No carotid bruits LYMPHATICS: No lymphadenopathy CARDIAC: RRR, continuous murmur from AV fistula auscultated RESPIRATORY:  Clear to auscultation without rales, wheezing or rhonchi  ABDOMEN: Soft, non-tender, non-distended MUSCULOSKELETAL:  No edema; No deformity  SKIN: Warm and  dry NEUROLOGIC:  Alert and oriented x 3 PSYCHIATRIC:  Normal affect   ASSESSMENT:    1. Paroxysmal SVT (supraventricular tachycardia) ([place])   2. Primary hypertension     PLAN:    In order of problems listed above:  Tachycardia noted during dialysis. Patient denies any cardiac symptoms.  Cardiac monitor shows occasional paroxysmal SVTs, PACs.  Majority of tachycardia was sinus tach.  Heart rate today 120 bpm, patient asymptomatic.  She may have inappropriate sinus tach.  Start Toprol-XL 25 mg daily. History of hypertension, BP controlled.   Follow-up in 3 months.     Medication Adjustments/Labs and Tests Ordered: Current medicines are reviewed at length with the patient today.  Concerns regarding medicines are outlined above.  No orders of the defined types were placed in this encounter.  Meds ordered this encounter  Medications   metoprolol succinate (TOPROL XL) 25 MG 24  hr tablet    Sig: Take 1 tablet (25 mg total) by mouth daily.    Dispense:  90 tablet    Refill:  0     Patient Instructions  Medication Instructions:   Your physician has recommended you make the following change in your medication:   START TOPROL XL 25MG  - Take one tablet (25MG ) by mouth daily.   *If you need a refill on your cardiac medications before your next appointment, please call your pharmacy*   Lab Work:  None Ordered  If you have labs (blood work) drawn today and your tests are completely normal, you will receive your results only by: [place] (if you have MyChart) OR A paper copy in the mail If you have any lab test that is abnormal or we need to change your treatment, we will call you to review the results.   Testing/Procedures:  None Ordered   Follow-Up: At [facility], you and your health needs are our priority.  As part of our continuing mission to provide you with exceptional heart care, we have created designated Provider Care Teams.  These Care Teams include  your primary Cardiologist (physician) and Advanced Practice Providers (APPs -  Physician Assistants and Nurse Practitioners) who all work together to provide you with the care you need, when you need it.  We recommend signing up for the patient portal called "MyChart".  Sign up information is provided on this After Visit Summary.  MyChart is used to connect with patients for Virtual Visits (Telemedicine).  Patients are able to view lab/test results, encounter notes, upcoming appointments, etc.  Non-urgent messages can be sent to your provider as well.   To learn more about what you can do with MyChart, go to [email or web address].    Your next appointment:   3 month(s)  The format for your next appointment:   In Person  Provider:   You may see Kate Sable, MD or one of the following Advanced Practice Providers on your designated Care Team:   Murray Hodgkins, NP Christell Faith, PA-C Marrianne Mood, PA-C Cadence Rentchler, [place]     Signed, Kate Sable, MD  02/04/2021 12:26 PM    [place]

## 2021-02-04 NOTE — Patient Instructions (Signed)
Medication Instructions:   Your physician has recommended you make the following change in your medication:   START TOPROL XL 25MG  - Take one tablet (25MG ) by mouth daily.   *If you need a refill on your cardiac medications before your next appointment, please call your pharmacy*   Lab Work:  None Ordered  If you have labs (blood work) drawn today and your tests are completely normal, you will receive your results only by: [place] (if you have MyChart) OR A paper copy in the mail If you have any lab test that is abnormal or we need to change your treatment, we will call you to review the results.   Testing/Procedures:  None Ordered   Follow-Up: At [facility], you and your health needs are our priority.  As part of our continuing mission to provide you with exceptional heart care, we have created designated Provider Care Teams.  These Care Teams include your primary Cardiologist (physician) and Advanced Practice Providers (APPs -  Physician Assistants and Nurse Practitioners) who all work together to provide you with the care you need, when you need it.  We recommend signing up for the patient portal called "MyChart".  Sign up information is provided on this After Visit Summary.  MyChart is used to connect with patients for Virtual Visits (Telemedicine).  Patients are able to view lab/test results, encounter notes, upcoming appointments, etc.  Non-urgent messages can be sent to your provider as well.   To learn more about what you can do with MyChart, go to [email or web address].    Your next appointment:   3 month(s)  The format for your next appointment:   In Person  Provider:   You may see Kate Sable, MD or one of the following Advanced Practice Providers on your designated Care Team:   Murray Hodgkins, NP Christell Faith, PA-C Marrianne Mood, PA-C Cadence [place], [place]

## 2021-05-02 ENCOUNTER — Other Ambulatory Visit: Payer: Self-pay | Admitting: *Deleted

## 2021-05-02 MED ORDER — METOPROLOL SUCCINATE ER 25 MG PO TB24: 25.0000 mg | ORAL_TABLET | Freq: Every day | ORAL | 0 refills | Status: DC

## 2021-05-09 ENCOUNTER — Encounter: Payer: Self-pay | Admitting: Cardiology

## 2021-05-09 ENCOUNTER — Ambulatory Visit (INDEPENDENT_AMBULATORY_CARE_PROVIDER_SITE_OTHER): Payer: BC Managed Care – PPO | Admitting: Cardiology

## 2021-05-09 ENCOUNTER — Other Ambulatory Visit: Payer: Self-pay

## 2021-05-09 ENCOUNTER — Other Ambulatory Visit (INDEPENDENT_AMBULATORY_CARE_PROVIDER_SITE_OTHER): Payer: Self-pay | Admitting: Nurse Practitioner

## 2021-05-09 VITALS — BP 114/70 | HR 61 | Ht 63.0 in | Wt 212.0 lb

## 2021-05-09 DIAGNOSIS — I10 Essential (primary) hypertension: Secondary | ICD-10-CM | POA: Diagnosis not present

## 2021-05-09 DIAGNOSIS — N186 End stage renal disease: Secondary | ICD-10-CM

## 2021-05-09 DIAGNOSIS — I471 Supraventricular tachycardia: Secondary | ICD-10-CM

## 2021-05-09 NOTE — Patient Instructions (Signed)

## 2021-05-09 NOTE — Progress Notes (Signed)
Cardiology Office Note:    Date:  05/09/2021   ID:  Jennifer Newman, DOB [birth year], MRN [id number]  PCP:  Barbaraann Boys, MD   [place] Prov[facility] Cardiologist:  Kate Sable, MD     Referring MD: Barbaraann Boys, MD   Chief Complaint  Patient presents with   Other    3 month follow up - Meds reviewed verbally with patient.      History of Present Illness:    Jennifer Newman is a 55 y.o. female with a hx of ESRD on HD T Th S, hypertension, presenting for follow-up.    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  Started on Toprol-XL but patient stopped after 1 week due to dizziness.  She denies palpitations, chest pain, shortness of breath.  Her heart rates usually gets elevated only after dialysis sessions towards the end.  Heart rates go up to 120s, last about 5 minutes after patient is sitting down to rest.  Heart rates typically return to normal.  Prior notes Echocardiogram 12/2016 showed normal systolic function, EF 55 to 60%.  Past Medical History:  Diagnosis Date   Anxiety    Bipolar disorder ([place])    Chronic kidney disease    Depression    Dysrhythmia    tachycardia   GERD (gastroesophageal reflux disease)    Hypertension    Obsessive-compulsive disorder    PTSD (post-traumatic stress disorder)    Tachycardia, unspecified    Vertigo     Past Surgical History:  Procedure Laterality Date   A/V FISTULAGRAM Left 06/30/2019   Procedure: A/V FISTULAGRAM;  Surgeon: Algernon Huxley, MD;  Location: [place] [facility];  Service: Cardiovascular;  Laterality: Left;   AV FISTULA PLACEMENT Left 02/26/2019   Procedure: ARTERIOVENOUS (AV) FISTULA CREATION ( RADIAL CEPHALIC );  Surgeon: Algernon Huxley, MD;  Location: [facility];  Service: Vascular;  Laterality: Left;   AV FISTULA PLACEMENT Left 04/16/2019   Procedure: ARTERIOVENOUS (AV) FISTULA CREATION ( BRACHIAL CEPHALIC );  Surgeon: Algernon Huxley, MD;  Location: [facility];  Service: Vascular;  Laterality: Left;    DIALYSIS/PERMA CATHETER INSERTION N/A 08/22/2018   Procedure: DIALYSIS temporary catheter INSERTION;  Surgeon: Algernon Huxley, MD;  Location: [place] [facility];  Service: Cardiovascular;  Laterality: N/A;   DIALYSIS/PERMA CATHETER INSERTION N/A 08/26/2018   Procedure: DIALYSIS/PERMA CATHETER INSERTION;  Surgeon: Algernon Huxley, MD;  Location: [place] [facility];  Service: Cardiovascular;  Laterality: N/A;   DIALYSIS/PERMA CATHETER INSERTION N/A 01/01/2019   Procedure: DIALYSIS/PERMA CATHETER INSERTION;  Surgeon: Algernon Huxley, MD;  Location: [place] [facility];  Service: Cardiovascular;  Laterality: N/A;   DIALYSIS/PERMA CATHETER REMOVAL N/A 12/29/2019   Procedure: DIALYSIS/PERMA CATHETER REMOVAL;  Surgeon: Algernon Huxley, MD;  Location: [place] [facility];  Service: Cardiovascular;  Laterality: N/A;   FISTULA SUPERFICIALIZATION Left 10/29/2019   Procedure: FISTULA SUPERFICIALIZATION;  Surgeon: Algernon Huxley, MD;  Location: [facility];  Service: Vascular;  Laterality: Left;   none     PERIPHERAL VASCULAR THROMBECTOMY Left 03/12/2019   Procedure: PERIPHERAL VASCULAR THROMBECTOMY;  Surgeon: Algernon Huxley, MD;  Location: [place] [facility];  Service: Cardiovascular;  Laterality: Left;   PERIPHERAL VASCULAR THROMBECTOMY Left 03/24/2020   Procedure: PERIPHERAL VASCULAR THROMBECTOMY;  Surgeon: Algernon Huxley, MD;  Location: [place] [facility];  Service: Cardiovascular;  Laterality: Left;    Current Medications: No outpatient medications have been marked as taking for the 05/09/21 encounter (Office Visit) with [place],  Aaron Edelman, MD.     Allergies:   Patient has no known allergies.   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     Comment: not employed  Tobacco Use   Smoking status: Never   Smokeless tobacco: Never  Vaping Use   Vaping Use: Never used  Substance and Sexual Activity   Alcohol use: Not Currently    Alcohol/week: 0.0 standard drinks   Drug use: Not Currently    Types: Marijuana    Comment: > 77yrs   Sexual activity: Not Currently  Other Topics Concern   Not on file  Social History Narrative   Not on file   Social Determinants of Health   Financial Resource Strain: Not on file  Food Insecurity: Not on file  Transportation Needs: Not on file  Physical Activity: Not on file  Stress: Not on file  Social Connections: Not on file     Family History: The patient's family history is negative for COPD, Diabetes, Hypertension, and CAD.  ROS:   Please see the history of present illness.     All other systems reviewed and are negative.  EKGs/Labs/Other Studies Reviewed:    The following studies were reviewed today:   EKG:  EKG not  ordered today.    Recent Labs: 10/06/2020: ALT 29; Magnesium 2.4 10/07/2020: Hemoglobin 10.7; Platelets 205 10/08/2020: BUN 21; Creatinine, Ser 2.22; Potassium 3.1; Sodium 136  Recent Lipid Panel No results found for: CHOL, TRIG, HDL, CHOLHDL, VLDL, LDLCALC, LDLDIRECT   Risk Assessment/Calculations:          Physical Exam:    VS:  BP 114/70 (BP Location: Left Arm, Patient Position: Sitting, Cuff Size: Large)   Pulse 61   Ht 5\' 3"  (1.6 m)   Wt 212 lb (96.2 kg)   LMP 08/31/2018   SpO2 97%   BMI 37.55 kg/m     Wt Readings from Last 3 Encounters:  05/09/21 212 lb (96.2 kg)  02/04/21 212 lb (96.2 kg)  12/24/20 212 lb (96.2 kg)     GEN:  Well nourished, well developed in no acute distress HEENT: Normal NECK: No JVD; No carotid bruits LYMPHATICS: No lymphadenopathy CARDIAC: RRR, continuous murmur from AV fistula auscultated RESPIRATORY:  Clear to auscultation without rales, wheezing or rhonchi  ABDOMEN: Soft, non-tender,  non-distended MUSCULOSKELETAL:  No edema; No deformity  SKIN: Warm and dry NEUROLOGIC:  Alert and oriented x 3 PSYCHIATRIC:  Normal affect   ASSESSMENT:    1. Paroxysmal SVT (supraventricular tachycardia) ([place])   2. Primary hypertension   3. Severe obesity (BMI 35.0-39.9) with comorbidity ([place])     PLAN:    In order of problems listed above:  Occasional tachycardia after dialysis session lasting a few minutes.  No chest pain, no shortness of breath.  Likely physiologic response from fluid shifts with dialysis.  No indication for AV nodal agents.  Blood pressure is normal, heart rate low normal at 61 today.  Stop Toprol-XL as this caused dizziness. History of hypertension, BP controlled off BP meds. Obesity, weight loss recommended.  Follow-up as needed    Medication Adjustments/Labs and Tests Ordered: Current medicines are reviewed at length with the patient today.  Concerns regarding medicines are outlined  above.  No orders of the defined types were placed in this encounter.  No orders of the defined types were placed in this encounter.    Patient Instructions  Medication Instructions:  Your physician recommends that you continue on your current medications as directed. Please refer to the Current Medication list given to you today.  *If you need a refill on your cardiac medications before your next appointment, please call your pharmacy*   Lab Work: None ordered If you have labs (blood work) drawn today and your tests are completely normal, you will receive your results only by: [place] (if you have MyChart) OR A paper copy in the mail If you have any lab test that is abnormal or we need to change your treatment, we will call you to review the results.   Testing/Procedures: None ordered   Follow-Up: At [facility], you and your health needs are our priority.  As part of our continuing mission to provide you with exceptional heart care, we have created  designated Provider Care Teams.  These Care Teams include your primary Cardiologist (physician) and Advanced Practice Providers (APPs -  Physician Assistants and Nurse Practitioners) who all work together to provide you with the care you need, when you need it.  We recommend signing up for the patient portal called "MyChart".  Sign up information is provided on this After Visit Summary.  MyChart is used to connect with patients for Virtual Visits (Telemedicine).  Patients are able to view lab/test results, encounter notes, upcoming appointments, etc.  Non-urgent messages can be sent to your provider as well.   To learn more about what you can do with MyChart, go to [email or web address].    Your next appointment:   Follow up as needed   The format for your next appointment:   In Person  Provider:   You may see Kate Sable, MD or one of the following Advanced Practice Providers on your designated Care Team:   Murray Hodgkins, NP Christell Faith, PA-C Cadence [place], [place]    Other Instructions     Signed, Kate Sable, MD  05/09/2021 12:30 PM    [place]

## 2021-05-13 ENCOUNTER — Encounter (INDEPENDENT_AMBULATORY_CARE_PROVIDER_SITE_OTHER): Payer: Medicare Other

## 2021-05-13 ENCOUNTER — Ambulatory Visit (INDEPENDENT_AMBULATORY_CARE_PROVIDER_SITE_OTHER): Payer: Medicare Other | Admitting: Vascular Surgery

## 2021-06-10 ENCOUNTER — Ambulatory Visit (INDEPENDENT_AMBULATORY_CARE_PROVIDER_SITE_OTHER): Payer: BC Managed Care – PPO

## 2021-06-10 ENCOUNTER — Encounter: Payer: Self-pay | Admitting: Oncology

## 2021-06-10 ENCOUNTER — Ambulatory Visit (INDEPENDENT_AMBULATORY_CARE_PROVIDER_SITE_OTHER): Payer: Medicare Other | Admitting: Vascular Surgery

## 2021-06-10 ENCOUNTER — Other Ambulatory Visit: Payer: Self-pay

## 2021-06-10 DIAGNOSIS — N186 End stage renal disease: Secondary | ICD-10-CM

## 2021-06-17 ENCOUNTER — Encounter (INDEPENDENT_AMBULATORY_CARE_PROVIDER_SITE_OTHER): Payer: Self-pay | Admitting: *Deleted

## 2021-08-19 ENCOUNTER — Other Ambulatory Visit: Payer: Self-pay

## 2021-08-19 ENCOUNTER — Ambulatory Visit (INDEPENDENT_AMBULATORY_CARE_PROVIDER_SITE_OTHER): Payer: BC Managed Care – PPO | Admitting: Nurse Practitioner

## 2021-08-19 VITALS — BP 109/74 | HR 113 | Ht 63.0 in | Wt 211.4 lb

## 2021-08-19 DIAGNOSIS — I10 Essential (primary) hypertension: Secondary | ICD-10-CM

## 2021-08-19 DIAGNOSIS — Z992 Dependence on renal dialysis: Secondary | ICD-10-CM | POA: Diagnosis not present

## 2021-08-19 DIAGNOSIS — N186 End stage renal disease: Secondary | ICD-10-CM

## 2021-08-19 DIAGNOSIS — Z7689 Persons encountering health services in other specified circumstances: Secondary | ICD-10-CM | POA: Insufficient documentation

## 2021-08-19 NOTE — Progress Notes (Signed)
New Patient Office Visit  Subjective:  Patient ID: Jennifer Newman, female    DOB: August 17, 1965  Age: 56 y.o. MRN: [id number]  CC:  Chief Complaint  Patient presents with   New Patient (Initial Visit)    HPI CIERRIA HEIGHT 56 years old female, presents for establishing care. Her previous PCP was  Dr. Briscoe Deutscher at [facility]. Pt has a history of End stage renal disease. She is on hemodialysis every Tuesday, Thursday and Saturday.  Past Medical History:  Diagnosis Date   Anxiety    Bipolar disorder ([place])    Chronic kidney disease    Depression    Dysrhythmia    tachycardia   GERD (gastroesophageal reflux disease)    Hypertension    Obsessive-compulsive disorder    PTSD (post-traumatic stress disorder)    Tachycardia, unspecified    Vertigo     Past Surgical History:  Procedure Laterality Date   A/V FISTULAGRAM Left 06/30/2019   Procedure: A/V FISTULAGRAM;  Surgeon: Algernon Huxley, MD;  Location: [place] [facility];  Service: Cardiovascular;  Laterality: Left;   AV FISTULA PLACEMENT Left 02/26/2019   Procedure: ARTERIOVENOUS (AV) FISTULA CREATION ( RADIAL CEPHALIC );  Surgeon: Algernon Huxley, MD;  Location: [facility];  Service: Vascular;  Laterality: Left;   AV FISTULA PLACEMENT Left 04/16/2019   Procedure: ARTERIOVENOUS (AV) FISTULA CREATION ( BRACHIAL CEPHALIC );  Surgeon: Algernon Huxley, MD;  Location: [facility];  Service: Vascular;  Laterality: Left;   DIALYSIS/PERMA CATHETER INSERTION N/A 08/22/2018   Procedure: DIALYSIS temporary catheter INSERTION;  Surgeon: Algernon Huxley, MD;  Location: [place] [facility];  Service: Cardiovascular;  Laterality: N/A;   DIALYSIS/PERMA CATHETER INSERTION N/A 08/26/2018   Procedure: DIALYSIS/PERMA CATHETER INSERTION;  Surgeon: Algernon Huxley, MD;  Location: [place] [facility];  Service: Cardiovascular;  Laterality: N/A;   DIALYSIS/PERMA CATHETER INSERTION N/A 01/01/2019   Procedure: DIALYSIS/PERMA CATHETER INSERTION;  Surgeon: Algernon Huxley,  MD;  Location: [place] [facility];  Service: Cardiovascular;  Laterality: N/A;   DIALYSIS/PERMA CATHETER REMOVAL N/A 12/29/2019   Procedure: DIALYSIS/PERMA CATHETER REMOVAL;  Surgeon: Algernon Huxley, MD;  Location: [place] [facility];  Service: Cardiovascular;  Laterality: N/A;   FISTULA SUPERFICIALIZATION Left 10/29/2019   Procedure: FISTULA SUPERFICIALIZATION;  Surgeon: Algernon Huxley, MD;  Location: [facility];  Service: Vascular;  Laterality: Left;   none     PERIPHERAL VASCULAR THROMBECTOMY Left 03/12/2019   Procedure: PERIPHERAL VASCULAR THROMBECTOMY;  Surgeon: Algernon Huxley, MD;  Location: [place] [facility];  Service: Cardiovascular;  Laterality: Left;   PERIPHERAL VASCULAR THROMBECTOMY Left 03/24/2020   Procedure: PERIPHERAL VASCULAR THROMBECTOMY;  Surgeon: Algernon Huxley, MD;  Location: [place] [facility];  Service: Cardiovascular;  Laterality: Left;    Family History  Problem Relation Age of Onset   COPD Neg Hx    Diabetes Neg Hx    Hypertension Neg Hx    CAD Neg Hx     Social History   Socioeconomic History   Marital status: Married    Spouse name: jason   Number of children: 2   Years of education: Not on file   Highest education level: Associate degree: occupational, Hotel manager, or vocational program  Occupational History   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    Comment: not employed  Tobacco Use   Smoking status: Never   Smokeless tobacco: Never  Vaping Use   Vaping Use: Never used  Substance  and Sexual Activity   Alcohol use: Not Currently    Alcohol/week: 0.0 standard drinks   Drug use: Not Currently    Types: Marijuana    Comment: > 80yrs   Sexual activity: Not Currently  Other Topics Concern   Not on file  Social History Narrative   Not on file   Social Determinants of Health   Financial Resource Strain: Not on file  Food Insecurity: Not on file  Transportation Needs: Not on file  Physical Activity: Not on file  Stress: Not on file   Social Connections: Not on file  Intimate Partner Violence: Not on file    ROS Review of Systems  Constitutional:  Negative for appetite change and fatigue.  HENT:  Negative for congestion and ear pain.   Eyes:  Negative for discharge.  Respiratory:  Negative for chest tightness and shortness of breath.   Cardiovascular:  Negative for chest pain and palpitations.  Gastrointestinal:  Negative for abdominal pain and constipation.  Genitourinary:  Negative for difficulty urinating.  Musculoskeletal:  Negative for back pain and neck pain.  Skin:  Negative for color change.  Neurological:  Negative for dizziness, speech difficulty and light-headedness.  Psychiatric/Behavioral:  Negative for agitation, behavioral problems and confusion.    Objective:   Today's Vitals: BP 109/74    Pulse (!) 113    Ht 5\' 3"  (1.6 m)    Wt 211 lb 6.4 oz (95.9 kg)    LMP 08/31/2018    BMI 37.45 kg/m   Physical Exam Constitutional:      Appearance: Normal appearance. She is normal weight.  HENT:     Head: Normocephalic.     Right Ear: Tympanic membrane normal.     Left Ear: Tympanic membrane normal.     Nose: Nose normal.     Mouth/Throat:     Mouth: Mucous membranes are moist.  Eyes:     Pupils: Pupils are equal, round, and reactive to light.  Cardiovascular:     Rate and Rhythm: Regular rhythm. Tachycardia present.  Pulmonary:     Effort: Pulmonary effort is normal.     Breath sounds: Normal breath sounds.  Abdominal:     General: Bowel sounds are normal.     Palpations: Abdomen is soft.  Musculoskeletal:        General: Normal range of motion.  Skin:    General: Skin is warm.     Capillary Refill: Capillary refill takes less than 2 seconds.  Neurological:     General: No focal deficit present.     Mental Status: She is oriented to person, place, and time. Mental status is at baseline.  Psychiatric:        Mood and Affect: Mood normal.        Behavior: Behavior normal.        Thought  Content: Thought content normal.    Assessment & Plan:   Problem List Items Addressed This Visit       Genitourinary   ESRD on dialysis [facility])    Dialysis on tuesday, Thursday and Saturday.        Other   Severe obesity (BMI 35.0-39.9) with comorbidity ([place])    BMI 37.45 Advise pt to lose weight. Watch diet and follow a routine exercise schedule.       Establishing care with new doctor, encounter for - Primary    Screening lab ordered. Disability placard form signed.        Outpatient Encounter Medications as  of 08/19/2021  Medication Sig   [DISCONTINUED] metoprolol succinate (TOPROL XL) 25 MG 24 hr tablet Take 1 tablet (25 mg total) by mouth daily. (Patient not taking: Reported on 05/09/2021)   No facility-administered encounter medications on file as of 08/19/2021.    Follow-up: Return in about 2 weeks (around 09/02/2021).   Theresia Lo, NP

## 2021-08-22 ENCOUNTER — Other Ambulatory Visit: Payer: Self-pay

## 2021-08-22 ENCOUNTER — Ambulatory Visit (INDEPENDENT_AMBULATORY_CARE_PROVIDER_SITE_OTHER): Payer: Medicare Other

## 2021-08-22 DIAGNOSIS — I10 Essential (primary) hypertension: Secondary | ICD-10-CM

## 2021-08-22 DIAGNOSIS — Z7689 Persons encountering health services in other specified circumstances: Secondary | ICD-10-CM | POA: Diagnosis not present

## 2021-08-23 ENCOUNTER — Encounter: Payer: Self-pay | Admitting: Oncology

## 2021-08-23 LAB — TSH: TSH: 0.74 mIU/L

## 2021-08-23 LAB — SPECIMEN COMPROMISED

## 2021-08-23 LAB — CBC WITH DIFFERENTIAL/PLATELET
Absolute Monocytes: 940 cells/uL (ref 200–950)
Basophils Absolute: 70 cells/uL (ref 0–200)
Basophils Relative: 0.8 %
Eosinophils Absolute: 287 cells/uL (ref 15–500)
Eosinophils Relative: 3.3 %
HCT: 38.6 % (ref 35.0–45.0)
Hemoglobin: 12.5 g/dL (ref 11.7–15.5)
Lymphs Abs: 2332 cells/uL (ref 850–3900)
MCH: 28.4 pg (ref 27.0–33.0)
MCHC: 32.4 g/dL (ref 32.0–36.0)
MCV: 87.7 fL (ref 80.0–100.0)
MPV: 10.4 fL (ref 7.5–12.5)
Monocytes Relative: 10.8 %
Neutro Abs: 5072 cells/uL (ref 1500–7800)
Neutrophils Relative %: 58.3 %
Platelets: 315 10*3/uL (ref 140–400)
RBC: 4.4 10*6/uL (ref 3.80–5.10)
RDW: 13.8 % (ref 11.0–15.0)
Total Lymphocyte: 26.8 %
WBC: 8.7 10*3/uL (ref 3.8–10.8)

## 2021-08-23 LAB — COMPLETE METABOLIC PANEL WITH GFR
AG Ratio: 1.1 (calc) (ref 1.0–2.5)
ALT: 27 U/L (ref 6–29)
AST: 28 U/L (ref 10–35)
Albumin: 3.9 g/dL (ref 3.6–5.1)
Alkaline phosphatase (APISO): 153 U/L (ref 37–153)
BUN/Creatinine Ratio: 15 (calc) (ref 6–22)
BUN: 53 mg/dL — ABNORMAL HIGH (ref 7–25)
CO2: 25 mmol/L (ref 20–32)
Calcium: 9.5 mg/dL (ref 8.6–10.4)
Chloride: 94 mmol/L — ABNORMAL LOW (ref 98–110)
Creat: 3.48 mg/dL — ABNORMAL HIGH (ref 0.50–1.03)
Globulin: 3.5 g/dL (calc) (ref 1.9–3.7)
Glucose, Bld: 100 mg/dL — ABNORMAL HIGH (ref 65–99)
Potassium: 3.7 mmol/L (ref 3.5–5.3)
Sodium: 138 mmol/L (ref 135–146)
Total Bilirubin: 0.3 mg/dL (ref 0.2–1.2)
Total Protein: 7.4 g/dL (ref 6.1–8.1)
eGFR: 15 mL/min/{1.73_m2} — ABNORMAL LOW (ref >=60)

## 2021-08-23 LAB — LIPID PANEL
Cholesterol: 291 mg/dL — ABNORMAL HIGH (ref <200)
HDL: 45 mg/dL — ABNORMAL LOW (ref >=50)
LDL Cholesterol (Calc): 199 mg/dL (calc) — ABNORMAL HIGH
Non-HDL Cholesterol (Calc): 246 mg/dL (calc) — ABNORMAL HIGH (ref <130)
Total CHOL/HDL Ratio: 6.5 (calc) — ABNORMAL HIGH (ref <5.0)
Triglycerides: 263 mg/dL — ABNORMAL HIGH (ref <150)

## 2021-08-24 ENCOUNTER — Encounter: Payer: Self-pay | Admitting: Nurse Practitioner

## 2021-08-24 NOTE — Assessment & Plan Note (Signed)
Dialysis on tuesday, Thursday and Saturday.

## 2021-08-24 NOTE — Assessment & Plan Note (Signed)
BMI 37.45 Advise pt to lose weight. Watch diet and follow a routine exercise schedule.

## 2021-08-24 NOTE — Assessment & Plan Note (Signed)
BP stable. Follow low salt diet. Will continue to monitor.

## 2021-08-24 NOTE — Assessment & Plan Note (Addendum)
Screening lab ordered. Disability placard form signed.

## 2021-09-16 ENCOUNTER — Ambulatory Visit (INDEPENDENT_AMBULATORY_CARE_PROVIDER_SITE_OTHER): Payer: Medicare Other | Admitting: Nurse Practitioner

## 2021-09-16 ENCOUNTER — Other Ambulatory Visit: Payer: Self-pay

## 2021-09-16 ENCOUNTER — Encounter: Payer: Self-pay | Admitting: Obstetrics and Gynecology

## 2021-09-16 ENCOUNTER — Encounter: Payer: Self-pay | Admitting: Nurse Practitioner

## 2021-09-16 VITALS — BP 119/73 | HR 68 | Ht 63.0 in | Wt 206.0 lb

## 2021-09-16 DIAGNOSIS — N186 End stage renal disease: Secondary | ICD-10-CM | POA: Diagnosis not present

## 2021-09-16 DIAGNOSIS — Z992 Dependence on renal dialysis: Secondary | ICD-10-CM

## 2021-09-16 DIAGNOSIS — R928 Other abnormal and inconclusive findings on diagnostic imaging of breast: Secondary | ICD-10-CM | POA: Diagnosis not present

## 2021-09-16 NOTE — Progress Notes (Signed)
? ?Established Patient Office Visit ? ?Subjective:  ?Patient ID: Jennifer Newman, female    DOB: 1966/02/19  Age: 56 y.o. MRN: [id number] ? ?CC:  ?Chief Complaint  ?Patient presents with  ? Follow-up  ?  Patient is here today for follow up on an abnormal mammogram done with [facility] travel [facility]ogram bus.  ? ? ? ?HPI ? ?Jennifer Newman presents with abnormal mammogram result. She is on HD T,Th,S. ? ?HPI  ? ?Past Medical History:  ?Diagnosis Date  ? Anxiety   ? Bipolar disorder ([place])   ? Chronic kidney disease   ? Depression   ? Dysrhythmia   ? tachycardia  ? GERD (gastroesophageal reflux disease)   ? Hypertension   ? Obsessive-compulsive disorder   ? PTSD (post-traumatic stress disorder)   ? Tachycardia, unspecified   ? Vertigo   ? ? ?Past Surgical History:  ?Procedure Laterality Date  ? A/V FISTULAGRAM Left 06/30/2019  ? Procedure: A/V FISTULAGRAM;  Surgeon: Algernon Huxley, MD;  Location: [place] [facility];  Service: Cardiovascular;  Laterality: Left;  ? AV FISTULA PLACEMENT Left 02/26/2019  ? Procedure: ARTERIOVENOUS (AV) FISTULA CREATION ( RADIAL CEPHALIC );  Surgeon: Algernon Huxley, MD;  Location: [facility];  Service: Vascular;  Laterality: Left;  ? AV FISTULA PLACEMENT Left 04/16/2019  ? Procedure: ARTERIOVENOUS (AV) FISTULA CREATION ( BRACHIAL CEPHALIC );  Surgeon: Algernon Huxley, MD;  Location: [facility];  Service: Vascular;  Laterality: Left;  ? DIALYSIS/PERMA CATHETER INSERTION N/A 08/22/2018  ? Procedure: DIALYSIS temporary catheter INSERTION;  Surgeon: Algernon Huxley, MD;  Location: [place] [facility];  Service: Cardiovascular;  Laterality: N/A;  ? DIALYSIS/PERMA CATHETER INSERTION N/A 08/26/2018  ? Procedure: DIALYSIS/PERMA CATHETER INSERTION;  Surgeon: Algernon Huxley, MD;  Location: [place] [facility];  Service: Cardiovascular;  Laterality: N/A;  ? DIALYSIS/PERMA CATHETER INSERTION N/A 01/01/2019  ? Procedure: DIALYSIS/PERMA CATHETER INSERTION;  Surgeon: Algernon Huxley, MD;  Location: [place] [facility];  Service:  Cardiovascular;  Laterality: N/A;  ? DIALYSIS/PERMA CATHETER REMOVAL N/A 12/29/2019  ? Procedure: DIALYSIS/PERMA CATHETER REMOVAL;  Surgeon: Algernon Huxley, MD;  Location: [place] [facility];  Service: Cardiovascular;  Laterality: N/A;  ? FISTULA SUPERFICIALIZATION Left 10/29/2019  ? Procedure: FISTULA SUPERFICIALIZATION;  Surgeon: Algernon Huxley, MD;  Location: [facility];  Service: Vascular;  Laterality: Left;  ? none    ? PERIPHERAL VASCULAR THROMBECTOMY Left 03/12/2019  ? Procedure: PERIPHERAL VASCULAR THROMBECTOMY;  Surgeon: Algernon Huxley, MD;  Location: [place] [facility];  Service: Cardiovascular;  Laterality: Left;  ? PERIPHERAL VASCULAR THROMBECTOMY Left 03/24/2020  ? Procedure: PERIPHERAL VASCULAR THROMBECTOMY;  Surgeon: Algernon Huxley, MD;  Location: [place] [facility];  Service: Cardiovascular;  Laterality: Left;  ? ? ?Family History  ?Problem Relation Age of Onset  ? COPD Neg Hx   ? Diabetes Neg Hx   ? Hypertension Neg Hx   ? CAD Neg Hx   ? ? ?Social History  ? ?Socioeconomic History  ? Marital status: Married  ?  Spouse name: Corene Cornea  ? Number of children: 2  ? Years of education: Not on file  ? Highest education level: Associate degree: occupational, Hotel manager, or vocational program  ?Occupational History  ? 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  Comment: not employed  ?Tobacco Use  ? Smoking status: Never  ? Smokeless tobacco: Never  ?Vaping Use  ? Vaping Use: Never used  ?Substance and Sexual Activity  ?  Alcohol use: Not Currently  ?  Alcohol/week: 0.0 standard drinks  ? Drug use: Not Currently  ?  Types: Marijuana  ?  Comment: > 73yr  ? Sexual activity: Not Currently  ?Other Topics Concern  ? Not on file  ?Social History Narrative  ? Not on file  ? ?Social Determinants of Health  ? ?Financial Resource Strain: Not on file  ?Food Insecurity: Not on file  ?Transportation Needs: Not on file  ?Physical Activity: Not on file  ?Stress: Not on file  ?Social Connections: Not on file  ?Intimate  Partner Violence: Not on file  ? ? ? ?No outpatient medications prior to visit.  ? ?No facility-administered medications prior to visit.  ? ? ?No Known Allergies ? ?ROS ?Review of Systems  ?Constitutional:  Negative for activity change and appetite change.  ?HENT:  Negative for congestion, facial swelling and sinus pain.   ?Eyes: Negative.   ?Respiratory:  Negative for cough, shortness of breath and wheezing.   ?Cardiovascular:  Negative for chest pain and palpitations.  ?Gastrointestinal: Negative.   ?Genitourinary: Negative.   ?Musculoskeletal: Negative.   ?Skin: Negative.   ?Neurological:  Negative for dizziness, light-headedness and headaches.  ?Psychiatric/Behavioral:  Negative for agitation, behavioral problems and confusion.   ? ?  ?Objective:  ?  ?Physical Exam ?Constitutional:   ?   Appearance: Normal appearance. She is obese.  ?HENT:  ?   Right Ear: Tympanic membrane normal.  ?   Left Ear: Tympanic membrane normal.  ?   Nose: Nose normal.  ?   Mouth/Throat:  ?   Mouth: Mucous membranes are moist.  ?Eyes:  ?   Pupils: Pupils are equal, round, and reactive to light.  ?Cardiovascular:  ?   Rate and Rhythm: Normal rate and regular rhythm.  ?   Pulses: Normal pulses.  ?   Heart sounds: Normal heart sounds.  ?Pulmonary:  ?   Effort: Pulmonary effort is normal.  ?   Breath sounds: Normal breath sounds.  ?Abdominal:  ?   General: Bowel sounds are normal.  ?   Palpations: Abdomen is soft.  ?Musculoskeletal:     ?   General: Normal range of motion.  ?   Cervical back: Normal range of motion.  ?Skin: ?   General: Skin is warm.  ?   Capillary Refill: Capillary refill takes less than 2 seconds.  ?Neurological:  ?   General: No focal deficit present.  ?   Mental Status: She is alert and oriented to person, place, and time. Mental status is at baseline.  ?Psychiatric:     ?   Mood and Affect: Mood normal.     ?   Behavior: Behavior normal.     ?   Thought Content: Thought content normal.     ?   Judgment: Judgment  normal.  ? ? ?BP 119/73   Pulse 68   Ht 5' 3"  (1.6 m)   Wt 206 lb (93.4 kg)   LMP 08/31/2018   BMI 36.49 kg/m?  ?Wt Readings from Last 3 Encounters:  ?09/16/21 206 lb (93.4 kg)  ?08/19/21 211 lb 6.4 oz (95.9 kg)  ?05/09/21 212 lb (96.2 kg)  ? ? ? ?Health Maintenance Due  ?Topic Date Due  ? Zoster Vaccines- Shingrix (1 of 2) Never done  ? PAP SMEAR-Modifier  Never done  ? ? ?There are no preventive care reminders to display for this patient. ? ?Lab Results  ?Component Value Date  ? TSH 0.74 08/22/2021  ? ?  Lab Results  ?Component Value Date  ? WBC 8.7 08/22/2021  ? HGB 12.5 08/22/2021  ? HCT 38.6 08/22/2021  ? MCV 87.7 08/22/2021  ? PLT 315 08/22/2021  ? ?Lab Results  ?Component Value Date  ? NA 138 08/22/2021  ? K 3.7 08/22/2021  ? CO2 25 08/22/2021  ? GLUCOSE 100 (H) 08/22/2021  ? BUN 53 (H) 08/22/2021  ? CREATININE 3.48 (H) 08/22/2021  ? BILITOT 0.3 08/22/2021  ? ALKPHOS 197 (H) 10/06/2020  ? AST 28 08/22/2021  ? ALT 27 08/22/2021  ? PROT 7.4 08/22/2021  ? ALBUMIN 3.3 (L) 10/07/2020  ? CALCIUM 9.5 08/22/2021  ? ANIONGAP 10 10/08/2020  ? EGFR 15 (L) 08/22/2021  ? ?Lab Results  ?Component Value Date  ? CHOL 291 (H) 08/22/2021  ? ?Lab Results  ?Component Value Date  ? HDL 45 (L) 08/22/2021  ? ?Lab Results  ?Component Value Date  ? LDLCALC 199 (H) 08/22/2021  ? ?Lab Results  ?Component Value Date  ? TRIG 263 (H) 08/22/2021  ? ?Lab Results  ?Component Value Date  ? CHOLHDL 6.5 (H) 08/22/2021  ? ?No results found for: HGBA1C ? ?  ?Assessment & Plan:  ? ?Problem List Items Addressed This Visit   ? ?  ? Genitourinary  ? ESRD on dialysis [facility])  ?  Patient goes for HD on T,Th and Saturday. ?Followed by nephrology. ?  ?  ?  ? Other  ? Abnormal mammogram - Primary  ? Relevant Orders  ? MM DIAG BREAST TOMO UNI LEFT  ? US BREAST LTD UNI LEFT INC AXILLA  ? Ambulatory referral to Obstetrics / Gynecology  ? ? ? ?No orders of the defined types were placed in this encounter. ? ? ? ?Follow-up: No follow-ups on file.   ? ? ?Theresia Lo, NP ?

## 2021-09-16 NOTE — Assessment & Plan Note (Signed)
Patient goes for HD on T,Th and Saturday. ?Followed by nephrology. ?

## 2021-09-16 NOTE — Assessment & Plan Note (Signed)
Patient has abnormal mammogram result. ?Ordered sent for [place] and [place] ?Referral sent to gyn. ?

## 2021-10-03 ENCOUNTER — Other Ambulatory Visit: Payer: Self-pay | Admitting: *Deleted

## 2021-10-03 ENCOUNTER — Inpatient Hospital Stay
Admission: RE | Admit: 2021-10-03 | Discharge: 2021-10-03 | Disposition: A | Discharge status: Home / Self Care | Payer: Self-pay | Source: Ambulatory Visit | Attending: *Deleted | Admitting: *Deleted

## 2021-10-03 DIAGNOSIS — Z1231 Encounter for screening mammogram for malignant neoplasm of breast: Secondary | ICD-10-CM

## 2021-10-04 ENCOUNTER — Other Ambulatory Visit: Payer: Self-pay

## 2021-10-19 ENCOUNTER — Encounter: Payer: Self-pay | Admitting: Obstetrics and Gynecology

## 2021-10-19 ENCOUNTER — Ambulatory Visit (INDEPENDENT_AMBULATORY_CARE_PROVIDER_SITE_OTHER): Payer: Medicare Other | Admitting: Obstetrics and Gynecology

## 2021-10-19 ENCOUNTER — Other Ambulatory Visit: Payer: Self-pay | Admitting: Obstetrics and Gynecology

## 2021-10-19 VITALS — BP 132/84 | HR 78 | Ht 63.0 in | Wt 204.8 lb

## 2021-10-19 DIAGNOSIS — R928 Other abnormal and inconclusive findings on diagnostic imaging of breast: Secondary | ICD-10-CM

## 2021-10-19 DIAGNOSIS — N6322 Unspecified lump in the left breast, upper inner quadrant: Secondary | ICD-10-CM

## 2021-10-19 NOTE — Progress Notes (Signed)
Patient presents today to discuss recent abnormal mammogram. She states a history of a cyst in her breast in 2018 but no other prior concerns. She states no other concerns at this time. ?

## 2021-10-19 NOTE — Progress Notes (Signed)
HPI: ?     [name] is a 56 y.o. [id number] who LMP was Patient's last menstrual period was 08/31/2018. ? ?Subjective:  ? ?She presents today because she had a screening/ultrasound of her breasts and was found to have 3 small irregularities present.  She was referred here for follow-up but has not yet had a screening or diagnostic mammogram. ?She originally had the bus screening because she is on the list for a kidney transplant and was trying to get ahead of the game and make sure she was caught up with all of her tests and there would not be a hold-up if the kidney became available. ?She reports no symptoms in her breasts. ? ?  Hx: ?The following portions of the patient's history were reviewed and updated as appropriate: ?            She  has a past medical history of Anxiety, Bipolar disorder ([place]), Chronic kidney disease, Depression, Dysrhythmia, GERD (gastroesophageal reflux disease), Hypertension, Obsessive-compulsive disorder, PTSD (post-traumatic stress disorder), Tachycardia, unspecified, and Vertigo. ?She does not have any pertinent problems on file. ?She  has a past surgical history that includes none; DIALYSIS/PERMA CATHETER INSERTION (N/A, 08/22/2018); DIALYSIS/PERMA CATHETER INSERTION (N/A, 08/26/2018); DIALYSIS/PERMA CATHETER INSERTION (N/A, 01/01/2019); AV fistula placement (Left, 02/26/2019); PERIPHERAL VASCULAR THROMBECTOMY (Left, 03/12/2019); AV fistula placement (Left, 04/16/2019); A/V Fistulagram (Left, 06/30/2019); Fistula superficialization (Left, 10/29/2019); DIALYSIS/PERMA CATHETER REMOVAL (N/A, 12/29/2019); and PERIPHERAL VASCULAR THROMBECTOMY (Left, 03/24/2020). ?Her family history is not on file. ?She  reports that she has never smoked. She has never used smokeless tobacco. She reports that she does not currently use alcohol. She reports that she does not currently use drugs after having used the following drugs: Marijuana. ?She currently has no medications in their medication list. ?She  has No Known Allergies. ?      ?Review of Systems:  ?Review of Systems ? ?Constitutional: Denied constitutional symptoms, night sweats, recent illness, fatigue, fever, insomnia and weight loss.  ?Eyes: Denied eye symptoms, eye pain, photophobia, vision change and visual disturbance.  ?Ears/Nose/Throat/Neck: Denied ear, nose, throat or neck symptoms, hearing loss, nasal discharge, sinus congestion and sore throat.  ?Cardiovascular: Denied cardiovascular symptoms, arrhythmia, chest pain/pressure, edema, exercise intolerance, orthopnea and palpitations.  ?Respiratory: Denied pulmonary symptoms, asthma, pleuritic pain, productive sputum, cough, dyspnea and wheezing.  ?Gastrointestinal: Denied, gastro-esophageal reflux, melena, nausea and vomiting.  ?Genitourinary: See HPI for additional information.  ?Musculoskeletal: Denied musculoskeletal symptoms, stiffness, swelling, muscle weakness and myalgia.  ?Dermatologic: Denied dermatology symptoms, rash and scar.  ?Neurologic: Denied neurology symptoms, dizziness, headache, neck pain and syncope.  ?Psychiatric: Denied psychiatric symptoms, anxiety and depression.  ?Endocrine: Denied endocrine symptoms including hot flashes and night sweats.  ? ?Meds: ?  ?No current outpatient medications on file prior to visit.  ? ?No current facility-administered medications on file prior to visit.  ? ? ? ? ?Objective:  ?  ? ?Vitals:  ? 10/19/21 1330  ?BP: 132/84  ?Pulse: 78  ? ?Filed Weights  ? 10/19/21 1330  ?Weight: 204 lb 12.8 oz (92.9 kg)  ? ?  ?          ?        ? ?Assessment:  ?  ?G2P2002 ?Patient Active Problem List  ? Diagnosis Date Noted  ? Abnormal mammogram 09/16/2021  ? Establishing care with new doctor, encounter for 08/19/2021  ? Clostridioides difficile diarrhea 10/08/2020  ? Norovirus 10/08/2020  ? Infectious gastroenteritis and colitis, unspecified 10/08/2020  ? Gastroenteritis 10/06/2020  ?  Secondary hyperparathyroidism of renal origin ([place]) 08/10/2020  ? Severe obesity  (BMI 35.0-39.9) with comorbidity ([place]) 01/02/2020  ? [place] 12/04/2019  ? ESRD on dialysis ([place]) 03/07/2019  ? Disorder of phosphorus metabolism, unspecified 01/07/2019  ? Fluid overload, unspecified 11/27/2018  ? Coagulation defect, unspecified ([place]) 11/16/2018  ? Acute otitis externa of left ear 10/18/2018  ? Acute suppurative otitis media without spontaneous rupture of ear drum, left ear 10/18/2018  ? Acute viral pharyngitis 10/18/2018  ? Cough 10/18/2018  ? Migraine without aura and without status migrainosus, not intractable 10/16/2018  ? Current moderate episode of major depressive disorder without prior episode ([place]) 10/08/2018  ? Dialysis patient [facility]) 10/08/2018  ? Headache 09/24/2018  ? Antineutrophil cytoplasmic antibody (ANCA) positive 09/04/2018  ? Anxiety 09/04/2018  ? Essential hypertension 09/04/2018  ? Glomerulonephritis 09/04/2018  ? 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 Anemia, unspecified 08/22/2018  ? 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 Other iron deficiency anemias 08/22/2018  ? Acute kidney injury ([place]) 08/21/2018  ? Chronic renal failure, stage 5 ([facility]) 08/21/2018  ? Hyperlipidemia 10/13/2014  ? Acute serous otitis media 01/26/2012  ? Obstructed eustachian tube 01/26/2012  ? Other general medical examination for administrative purposes 06/02/2011  ? ?  ?1. Abnormal mammogram   ? ?  ? ? ?Plan:  ?  ?       ? 1.  We have discussed her most recent breast findings.  Based on these findings she needs a follow-up diagnostic mammogram.  This was ordered.  She will schedule this at [facility] and have appropriate follow-up. ? We have discussed the abnormalities noted in great detail.  I have talked to her about follow-up and the typical course of abnormal breast findings.  All of her questions were answered. ?Orders ?No orders of the  defined types were placed in this encounter. ? ? No orders of the defined types were placed in this encounter. ?  ?  F/U ? No follow-ups on file. ?I spent 21 minutes involved in the care of this patient preparing to see the patient by obtaining and reviewing her medical history (including labs, imaging tests and prior procedures), documenting clinical information in the electronic health record (EHR), counseling and coordinating care plans, writing and sending prescriptions, ordering tests or procedures and in direct communicating with the patient and medical staff discussing pertinent items from her history and physical exam. ? ?Finis Bud, M.D. ?10/19/2021 ?2:16 PM ? ? ? ? ?

## 2021-10-26 ENCOUNTER — Other Ambulatory Visit: Payer: Self-pay | Admitting: Internal Medicine

## 2021-10-26 DIAGNOSIS — N6322 Unspecified lump in the left breast, upper inner quadrant: Secondary | ICD-10-CM

## 2021-11-14 ENCOUNTER — Ambulatory Visit
Admission: RE | Admit: 2021-11-14 | Discharge: 2021-11-14 | Disposition: A | Discharge status: Home / Self Care | Payer: Medicare Other | Source: Ambulatory Visit | Attending: Internal Medicine | Admitting: Internal Medicine

## 2021-11-14 ENCOUNTER — Other Ambulatory Visit: Payer: Self-pay | Admitting: *Deleted

## 2021-11-14 DIAGNOSIS — N6322 Unspecified lump in the left breast, upper inner quadrant: Secondary | ICD-10-CM | POA: Diagnosis not present

## 2021-11-15 ENCOUNTER — Other Ambulatory Visit: Payer: Self-pay | Admitting: Internal Medicine

## 2021-11-16 ENCOUNTER — Other Ambulatory Visit: Payer: Self-pay | Admitting: Internal Medicine

## 2021-11-16 ENCOUNTER — Other Ambulatory Visit: Payer: Self-pay

## 2021-11-16 DIAGNOSIS — N63 Unspecified lump in unspecified breast: Secondary | ICD-10-CM

## 2021-11-16 DIAGNOSIS — R921 Mammographic calcification found on diagnostic imaging of breast: Secondary | ICD-10-CM

## 2021-11-16 DIAGNOSIS — R928 Other abnormal and inconclusive findings on diagnostic imaging of breast: Secondary | ICD-10-CM

## 2021-11-17 ENCOUNTER — Ambulatory Visit: Payer: Medicare Other | Admitting: Nurse Practitioner

## 2021-11-25 ENCOUNTER — Ambulatory Visit: Payer: Medicare Other | Admitting: Nurse Practitioner

## 2021-12-07 ENCOUNTER — Ambulatory Visit
Admission: RE | Admit: 2021-12-07 | Discharge: 2021-12-07 | Disposition: A | Discharge status: Home / Self Care | Payer: Medicare Other | Source: Ambulatory Visit | Attending: Internal Medicine | Admitting: Internal Medicine

## 2021-12-07 ENCOUNTER — Other Ambulatory Visit (INDEPENDENT_AMBULATORY_CARE_PROVIDER_SITE_OTHER): Payer: Self-pay | Admitting: Nurse Practitioner

## 2021-12-07 DIAGNOSIS — R928 Other abnormal and inconclusive findings on diagnostic imaging of breast: Secondary | ICD-10-CM

## 2021-12-07 DIAGNOSIS — N63 Unspecified lump in unspecified breast: Secondary | ICD-10-CM | POA: Diagnosis present

## 2021-12-07 DIAGNOSIS — R921 Mammographic calcification found on diagnostic imaging of breast: Secondary | ICD-10-CM | POA: Diagnosis present

## 2021-12-07 DIAGNOSIS — N186 End stage renal disease: Secondary | ICD-10-CM

## 2021-12-07 HISTORY — PX: BREAST BIOPSY: SHX20

## 2021-12-08 LAB — SURGICAL PATHOLOGY

## 2021-12-09 ENCOUNTER — Ambulatory Visit (INDEPENDENT_AMBULATORY_CARE_PROVIDER_SITE_OTHER): Payer: Medicare Other

## 2021-12-09 ENCOUNTER — Encounter (INDEPENDENT_AMBULATORY_CARE_PROVIDER_SITE_OTHER): Payer: Self-pay | Admitting: Nurse Practitioner

## 2021-12-09 ENCOUNTER — Ambulatory Visit (INDEPENDENT_AMBULATORY_CARE_PROVIDER_SITE_OTHER): Payer: Medicare Other | Admitting: Nurse Practitioner

## 2021-12-09 VITALS — BP 132/84 | HR 76 | Resp 16 | Wt 203.0 lb

## 2021-12-09 DIAGNOSIS — N186 End stage renal disease: Secondary | ICD-10-CM

## 2021-12-09 DIAGNOSIS — I10 Essential (primary) hypertension: Secondary | ICD-10-CM

## 2021-12-09 DIAGNOSIS — E785 Hyperlipidemia, unspecified: Secondary | ICD-10-CM

## 2021-12-09 NOTE — Progress Notes (Unsigned)
Subjective:    Patient ID: Jennifer Newman, female    DOB: 08/13/1965, 56 y.o.   MRN: [id number] Chief Complaint  Patient presents with   Follow-up    Ultrasound follow up    The patient returns to the office for followup of their dialysis access.   The patient reports the function of the access has been stable. Patient denies difficulty with cannulation. The patient denies increased bleeding time after removing the needles. The patient denies hand pain or other symptoms consistent with steal phenomena.  No significant arm swelling.  The patient denies any complaints from the dialysis center or their nephrologist.  The patient denies redness or swelling at the access site. The patient denies fever or chills at home or while on dialysis.  No recent shortening of the patient's walking distance or new symptoms consistent with claudication.  No history of rest pain symptoms. No new ulcers or wounds of the lower extremities have occurred.  The patient denies amaurosis fugax or recent TIA symptoms. There are no recent neurological changes noted. There is no history of DVT, PE or superficial thrombophlebitis. No recent episodes of angina or shortness of breath documented.   Duplex ultrasound of the AV access shows a patent access.  The previously noted stenosis is not significantly changed compared to last study.  Flow volume today is 3743 cc/min (previous flow volume was 2765 cc/min)       Review of Systems  All other systems reviewed and are negative.      Objective:   Physical Exam Vitals reviewed.  HENT:     Head: Normocephalic.  Cardiovascular:     Rate and Rhythm: Normal rate.     Pulses:          Carotid pulses are 2+ on the left side.    Arteriovenous access: Left arteriovenous access is present.    Comments: Good thrill and bruit Pulmonary:     Effort: Pulmonary effort is normal.  Skin:    General: Skin is warm and dry.  Neurological:     Mental Status: She is  alert and oriented to person, place, and time.  Psychiatric:        Mood and Affect: Mood normal.        Behavior: Behavior normal.        Thought Content: Thought content normal.        Judgment: Judgment normal.     BP 132/84 (BP Location: Right Arm)   Pulse 76   Resp 16   Wt 203 lb 0.7 oz (92.1 kg)   LMP 08/31/2018   BMI 35.97 kg/m   Past Medical History:  Diagnosis Date   Anxiety    Bipolar disorder (HCC)    Chronic kidney disease    Depression    Dysrhythmia    tachycardia   GERD (gastroesophageal reflux disease)    Hypertension    Obsessive-compulsive disorder    PTSD (post-traumatic stress disorder)    Tachycardia, unspecified    Vertigo     Social History   Socioeconomic History   Marital status: Married    Spouse name: jason   Number of children: 2   Years of education: Not on file   Highest education level: Associate degree: occupational, Scientist, product/process development, or vocational program  Occupational History   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    Comment: not employed  Tobacco Use   Smoking status: Never   Smokeless tobacco: Never  Vaping Use  Vaping Use: Never used  Substance and Sexual Activity   Alcohol use: Not Currently    Alcohol/week: 0.0 standard drinks of alcohol   Drug use: Not Currently    Types: Marijuana    Comment: > 53yrs   Sexual activity: Not Currently    Birth control/protection: Post-menopausal  Other Topics Concern   Not on file  Social History Narrative   Not on file   Social Determinants of Health   Financial Resource Strain: Low Risk  (08/13/2017)   Overall Financial Resource Strain (CARDIA)    Difficulty of Paying Living Expenses: Not hard at all  Food Insecurity: No Food Insecurity (08/13/2017)   Hunger Vital Sign    Worried About Running Out of Food in the Last Year: Never true    Ran Out of Food in the Last Year: Never true  Transportation Needs: No Transportation Needs (08/13/2017)   PRAPARE -  Administrator, Civil Service (Medical): No    Lack of Transportation (Non-Medical): No  Physical Activity: Inactive (08/13/2017)   Exercise Vital Sign    Days of Exercise per Week: 0 days    Minutes of Exercise per Session: 0 min  Stress: Stress Concern Present (08/13/2017)   [employer] of Occupational Health - Occupational Stress Questionnaire    Feeling of Stress : Rather much  Social Connections: Moderately Isolated (08/13/2017)   Social Connection and Isolation Panel [NHANES]    Frequency of Communication with Friends and Family: Never    Frequency of Social Gatherings with Friends and Family: Never    Attends Religious Services: Never    Database administrator or Organizations: No    Attends Banker Meetings: Never    Marital Status: Married  Catering manager Violence: Not At Risk (08/13/2017)   Humiliation, Afraid, Rape, and Kick questionnaire    Fear of Current or Ex-Partner: No    Emotionally Abused: No    Physically Abused: No    Sexually Abused: No    Past Surgical History:  Procedure Laterality Date   A/V FISTULAGRAM Left 06/30/2019   Procedure: A/V FISTULAGRAM;  Surgeon: Annice Needy, MD;  Location: [facility];  Service: Cardiovascular;  Laterality: Left;   AV FISTULA PLACEMENT Left 02/26/2019   Procedure: ARTERIOVENOUS (AV) FISTULA CREATION ( RADIAL CEPHALIC );  Surgeon: Annice Needy, MD;  Location: [facility];  Service: Vascular;  Laterality: Left;   AV FISTULA PLACEMENT Left 04/16/2019   Procedure: ARTERIOVENOUS (AV) FISTULA CREATION ( BRACHIAL CEPHALIC );  Surgeon: Annice Needy, MD;  Location: [facility];  Service: Vascular;  Laterality: Left;   BREAST BIOPSY Right 12/07/2021   Stereo Bx ribbon clip- path pending   BREAST BIOPSY Left 12/07/2021   [place] Core Bx 1200 5cmfn ribbon clip- path pending   BREAST BIOPSY Left 12/07/2021   [place] Core BX 300 Coil clip - Path pending   DIALYSIS/PERMA CATHETER INSERTION N/A 08/22/2018    Procedure: DIALYSIS temporary catheter INSERTION;  Surgeon: Annice Needy, MD;  Location: [facility];  Service: Cardiovascular;  Laterality: N/A;   DIALYSIS/PERMA CATHETER INSERTION N/A 08/26/2018   Procedure: DIALYSIS/PERMA CATHETER INSERTION;  Surgeon: Annice Needy, MD;  Location: [facility];  Service: Cardiovascular;  Laterality: N/A;   DIALYSIS/PERMA CATHETER INSERTION N/A 01/01/2019   Procedure: DIALYSIS/PERMA CATHETER INSERTION;  Surgeon: Annice Needy, MD;  Location: [facility];  Service: Cardiovascular;  Laterality: N/A;   DIALYSIS/PERMA CATHETER REMOVAL N/A 12/29/2019  Procedure: DIALYSIS/PERMA CATHETER REMOVAL;  Surgeon: Annice Needy, MD;  Location: [facility];  Service: Cardiovascular;  Laterality: N/A;   FISTULA SUPERFICIALIZATION Left 10/29/2019   Procedure: FISTULA SUPERFICIALIZATION;  Surgeon: Annice Needy, MD;  Location: [facility];  Service: Vascular;  Laterality: Left;   none     PERIPHERAL VASCULAR THROMBECTOMY Left 03/12/2019   Procedure: PERIPHERAL VASCULAR THROMBECTOMY;  Surgeon: Annice Needy, MD;  Location: [facility];  Service: Cardiovascular;  Laterality: Left;   PERIPHERAL VASCULAR THROMBECTOMY Left 03/24/2020   Procedure: PERIPHERAL VASCULAR THROMBECTOMY;  Surgeon: Annice Needy, MD;  Location: [facility];  Service: Cardiovascular;  Laterality: Left;    Family History  Problem Relation Age of Onset   COPD Neg Hx    Diabetes Neg Hx    Hypertension Neg Hx    CAD Neg Hx     No Known Allergies     Latest Ref Rng & Units 08/22/2021   10:47 AM 10/07/2020    6:17 AM 10/06/2020    1:22 PM  CBC  WBC 3.8 - 10.8 Thousand/uL 8.7  5.8  9.9   Hemoglobin 11.7 - 15.5 g/dL 16.1  09.6  04.5   Hematocrit 35.0 - 45.0 % 38.6  32.0  39.5   Platelets 140 - 400 Thousand/uL 315  205  271       CMP     Component Value Date/Time   NA 138 08/22/2021 1047   NA 135 (L) 10/09/2013 1548   K 3.7 08/22/2021 1047   K 3.6  10/09/2013 1548   CL 94 (L) 08/22/2021 1047   CL 106 10/09/2013 1548   CO2 25 08/22/2021 1047   CO2 23 10/09/2013 1548   GLUCOSE 100 (H) 08/22/2021 1047   GLUCOSE 84 10/09/2013 1548   BUN 53 (H) 08/22/2021 1047   BUN 16 10/09/2013 1548   CREATININE 3.48 (H) 08/22/2021 1047   CALCIUM 9.5 08/22/2021 1047   CALCIUM 8.9 10/09/2013 1548   PROT 7.4 08/22/2021 1047   PROT 8.6 (H) 10/09/2013 1548   ALBUMIN 3.3 (L) 10/07/2020 0617   ALBUMIN 4.0 10/09/2013 1548   AST 28 08/22/2021 1047   AST 36 10/09/2013 1548   ALT 27 08/22/2021 1047   ALT 38 10/09/2013 1548   ALKPHOS 197 (H) 10/06/2020 1322   ALKPHOS 118 (H) 10/09/2013 1548   BILITOT 0.3 08/22/2021 1047   BILITOT 0.3 10/09/2013 1548   GFRNONAA 26 (L) 10/08/2020 0448   GFRNONAA >60 10/09/2013 1548   GFRAA 15 (L) 10/20/2019 1200   GFRAA >60 10/09/2013 1548     No results found.     Assessment & Plan:   1. ESRD (end stage renal disease) (HCC) Recommend:  The patient is doing well and currently has adequate dialysis access. The patient's dialysis center is not reporting any access issues. Flow pattern is stable when compared to the prior ultrasound.  The patient should have a duplex ultrasound of the dialysis access in 6 months. The patient will follow-up with me in the office after each ultrasound     2. Essential hypertension Continue antihypertensive medications as already ordered, these medications have been reviewed and there are no changes at this time.   3. Hyperlipidemia, unspecified hyperlipidemia type Continue statin as ordered and reviewed, no changes at this time    Current Outpatient Medications on File Prior to Visit  Medication Sig Dispense Refill   sevelamer carbonate (RENVELA) 800 MG tablet Take 800 mg by mouth  3 (three) times daily.     No current facility-administered medications on file prior to visit.    There are no Patient Instructions on file for this visit. No follow-ups on file.   Georgiana Spinner, NP

## 2021-12-12 ENCOUNTER — Encounter (INDEPENDENT_AMBULATORY_CARE_PROVIDER_SITE_OTHER): Payer: Self-pay | Admitting: Nurse Practitioner

## 2021-12-14 ENCOUNTER — Encounter: Payer: Self-pay | Admitting: Obstetrics and Gynecology

## 2021-12-16 ENCOUNTER — Encounter (INDEPENDENT_AMBULATORY_CARE_PROVIDER_SITE_OTHER): Payer: Medicare Other

## 2021-12-16 ENCOUNTER — Ambulatory Visit (INDEPENDENT_AMBULATORY_CARE_PROVIDER_SITE_OTHER): Payer: Medicare Other | Admitting: Nurse Practitioner

## 2021-12-16 ENCOUNTER — Encounter: Payer: Self-pay | Admitting: Nurse Practitioner

## 2021-12-16 VITALS — BP 128/84 | HR 82 | Ht 63.0 in | Wt 206.1 lb

## 2021-12-16 DIAGNOSIS — F419 Anxiety disorder, unspecified: Secondary | ICD-10-CM

## 2021-12-16 DIAGNOSIS — N186 End stage renal disease: Secondary | ICD-10-CM | POA: Diagnosis not present

## 2021-12-16 DIAGNOSIS — Z992 Dependence on renal dialysis: Secondary | ICD-10-CM | POA: Diagnosis not present

## 2021-12-16 NOTE — Assessment & Plan Note (Signed)
Body mass index is 36.51 kg/m. Advised pt to lose weight. Advised patient to avoid trans fat, fatty and fried food. Follow a regular physical activity schedule.

## 2021-12-16 NOTE — Progress Notes (Signed)
Established Patient Office Visit  Subjective:  Patient ID: Jennifer Newman, female    DOB: 1965/09/13  Age: 56 y.o. MRN: [id number]  CC:  Chief Complaint  Patient presents with   Follow-up     HPI  Jennifer Newman presents for a routine follow up.  She is on HD T,Th,S. She is overall doing well. No complaints at present. She takes tyelonol  and Vit D during the days of her dialysis.  Patient is on the list for kidney transplant and is going to start the preliminary testing in July at [facility].  Patient had a mammogram and biopsy of the right breast which was negative for atypical proliferative breast disease.  Patient states that she had labs done at the nephrologist office.  HPI   Past Medical History:  Diagnosis Date   Anxiety    Bipolar disorder ([place])    Chronic kidney disease    Depression    Dysrhythmia    tachycardia   GERD (gastroesophageal reflux disease)    Hypertension    Obsessive-compulsive disorder    PTSD (post-traumatic stress disorder)    Tachycardia, unspecified    Vertigo     Past Surgical History:  Procedure Laterality Date   A/V FISTULAGRAM Left 06/30/2019   Procedure: A/V FISTULAGRAM;  Surgeon: Algernon Huxley, MD;  Location: [place] [facility];  Service: Cardiovascular;  Laterality: Left;   AV FISTULA PLACEMENT Left 02/26/2019   Procedure: ARTERIOVENOUS (AV) FISTULA CREATION ( RADIAL CEPHALIC );  Surgeon: Algernon Huxley, MD;  Location: [facility];  Service: Vascular;  Laterality: Left;   AV FISTULA PLACEMENT Left 04/16/2019   Procedure: ARTERIOVENOUS (AV) FISTULA CREATION ( BRACHIAL CEPHALIC );  Surgeon: Algernon Huxley, MD;  Location: [facility];  Service: Vascular;  Laterality: Left;   BREAST BIOPSY Right 12/07/2021   Stereo Bx ribbon clip- path pending   BREAST BIOPSY Left 12/07/2021   [place] Core Bx 1200 5cmfn ribbon clip- path pending   BREAST BIOPSY Left 12/07/2021   [place] Core BX 300 Coil clip - Path pending   DIALYSIS/PERMA CATHETER INSERTION N/A 08/22/2018    Procedure: DIALYSIS temporary catheter INSERTION;  Surgeon: Algernon Huxley, MD;  Location: [place] [facility];  Service: Cardiovascular;  Laterality: N/A;   DIALYSIS/PERMA CATHETER INSERTION N/A 08/26/2018   Procedure: DIALYSIS/PERMA CATHETER INSERTION;  Surgeon: Algernon Huxley, MD;  Location: [place] [facility];  Service: Cardiovascular;  Laterality: N/A;   DIALYSIS/PERMA CATHETER INSERTION N/A 01/01/2019   Procedure: DIALYSIS/PERMA CATHETER INSERTION;  Surgeon: Algernon Huxley, MD;  Location: [place] [facility];  Service: Cardiovascular;  Laterality: N/A;   DIALYSIS/PERMA CATHETER REMOVAL N/A 12/29/2019   Procedure: DIALYSIS/PERMA CATHETER REMOVAL;  Surgeon: Algernon Huxley, MD;  Location: [place] [facility];  Service: Cardiovascular;  Laterality: N/A;   FISTULA SUPERFICIALIZATION Left 10/29/2019   Procedure: FISTULA SUPERFICIALIZATION;  Surgeon: Algernon Huxley, MD;  Location: [facility];  Service: Vascular;  Laterality: Left;   none     PERIPHERAL VASCULAR THROMBECTOMY Left 03/12/2019   Procedure: PERIPHERAL VASCULAR THROMBECTOMY;  Surgeon: Algernon Huxley, MD;  Location: [place] [facility];  Service: Cardiovascular;  Laterality: Left;   PERIPHERAL VASCULAR THROMBECTOMY Left 03/24/2020   Procedure: PERIPHERAL VASCULAR THROMBECTOMY;  Surgeon: Algernon Huxley, MD;  Location: [place] [facility];  Service: Cardiovascular;  Laterality: Left;    Family History  Problem Relation Age of Onset   COPD Neg Hx    Diabetes Neg Hx  Hypertension Neg Hx    CAD Neg Hx     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    Comment: not employed  Tobacco Use   Smoking status: Never   Smokeless tobacco: Never  Vaping Use   Vaping  Use: Never used  Substance and Sexual Activity   Alcohol use: Not Currently    Alcohol/week: 0.0 standard drinks of alcohol   Drug use: Not Currently    Types: Marijuana    Comment: > 1yr   Sexual activity: Not Currently    Birth control/protection: Post-menopausal  Other Topics Concern   Not on file  Social History Narrative   Not on file   Social Determinants of Health   Financial Resource Strain: Low Risk  (08/13/2017)   Overall Financial Resource Strain (CARDIA)    Difficulty of Paying Living Expenses: Not hard at all  Food Insecurity: No Food Insecurity (08/13/2017)   Hunger Vital Sign    Worried About Running Out of Food in the Last Year: Never true    RElvastonin the Last Year: Never true  Transportation Needs: No Transportation Needs (08/13/2017)   PRAPARE - THydrologist(Medical): No    Lack of Transportation (Non-Medical): No  Physical Activity: Inactive (08/13/2017)   Exercise Vital Sign    Days of Exercise per Week: 0 days    Minutes of Exercise per Session: 0 min  Stress: Stress Concern Present (08/13/2017)   [place]   Feeling of Stress : Rather much  Social Connections: Moderately Isolated (08/13/2017)   Social Connection and Isolation Panel [NHANES]    Frequency of Communication with Friends and Family: Never    Frequency of Social Gatherings with Friends and Family: Never    Attends Religious Services: Never    AMarine scientistor Organizations: No    Attends CArchivistMeetings: Never    Marital Status: Married  IHuman resources officerViolence: Not At Risk (08/13/2017)   Humiliation, Afraid, Rape, and Kick questionnaire    Fear of Current or Ex-Partner: No    Emotionally Abused: No    Physically Abused: No    Sexually Abused: No     Outpatient Medications Prior to Visit  Medication Sig Dispense Refill   sevelamer carbonate (RENVELA) 800 MG  tablet Take 800 mg by mouth 3 (three) times daily.     No facility-administered medications prior to visit.    No Known Allergies  ROS Review of Systems  Constitutional:  Negative for activity change and appetite change.  HENT:  Negative for congestion, facial swelling and sinus pain.   Eyes: Negative.   Respiratory:  Negative for cough, shortness of breath and wheezing.   Cardiovascular:  Negative for chest pain and palpitations.  Gastrointestinal: Negative.   Genitourinary: Negative.   Musculoskeletal: Negative.   Skin: Negative.   Neurological:  Negative for dizziness, light-headedness and headaches.  Psychiatric/Behavioral:  Negative for agitation, behavioral problems and confusion.       Objective:    Physical Exam Constitutional:      Appearance: Normal appearance. She is obese.  HENT:     Right  Ear: Tympanic membrane normal.     Left Ear: Tympanic membrane normal.     Nose: Nose normal.     Mouth/Throat:     Mouth: Mucous membranes are moist.  Eyes:     Pupils: Pupils are equal, round, and reactive to light.  Cardiovascular:     Rate and Rhythm: Normal rate and regular rhythm.     Pulses: Normal pulses.     Heart sounds: Normal heart sounds.  Pulmonary:     Effort: Pulmonary effort is normal.     Breath sounds: Normal breath sounds.  Abdominal:     General: Bowel sounds are normal.     Palpations: Abdomen is soft.  Musculoskeletal:        General: Normal range of motion.     Cervical back: Normal range of motion.  Skin:    General: Skin is warm.     Capillary Refill: Capillary refill takes less than 2 seconds.  Neurological:     General: No focal deficit present.     Mental Status: She is alert and oriented to person, place, and time. Mental status is at baseline.  Psychiatric:        Mood and Affect: Mood normal.        Behavior: Behavior normal.        Thought Content: Thought content normal.        Judgment: Judgment normal.     BP 128/84    Pulse 82   Ht 5' 3"  (1.6 m)   Wt 206 lb 1.6 oz (93.5 kg)   LMP 08/31/2018   BMI 36.51 kg/m  Wt Readings from Last 3 Encounters:  12/16/21 206 lb 1.6 oz (93.5 kg)  12/09/21 203 lb 0.7 oz (92.1 kg)  10/19/21 204 lb 12.8 oz (92.9 kg)     Health Maintenance Due  Topic Date Due   PAP SMEAR-Modifier  Never done   Zoster Vaccines- Shingrix (1 of 2) Never done    There are no preventive care reminders to display for this patient.  Lab Results  Component Value Date   TSH 0.74 08/22/2021   Lab Results  Component Value Date   WBC 8.7 08/22/2021   HGB 12.5 08/22/2021   HCT 38.6 08/22/2021   MCV 87.7 08/22/2021   PLT 315 08/22/2021   Lab Results  Component Value Date   NA 138 08/22/2021   K 3.7 08/22/2021   CO2 25 08/22/2021   GLUCOSE 100 (H) 08/22/2021   BUN 53 (H) 08/22/2021   CREATININE 3.48 (H) 08/22/2021   BILITOT 0.3 08/22/2021   ALKPHOS 197 (H) 10/06/2020   AST 28 08/22/2021   ALT 27 08/22/2021   PROT 7.4 08/22/2021   ALBUMIN 3.3 (L) 10/07/2020   CALCIUM 9.5 08/22/2021   ANIONGAP 10 10/08/2020   EGFR 15 (L) 08/22/2021   Lab Results  Component Value Date   CHOL 291 (H) 08/22/2021   Lab Results  Component Value Date   HDL 45 (L) 08/22/2021   Lab Results  Component Value Date   LDLCALC 199 (H) 08/22/2021   Lab Results  Component Value Date   TRIG 263 (H) 08/22/2021   Lab Results  Component Value Date   CHOLHDL 6.5 (H) 08/22/2021   No results found for: "HGBA1C"    Assessment & Plan:   Problem List Items Addressed This Visit       Genitourinary   ESRD on dialysis [facility]) - Primary    Patient is on hemodialysis. Blood pressure  under control without any medications.        Other   Anxiety    Stable without medication.      Severe obesity (BMI 35.0-39.9) with comorbidity ([place])    Body mass index is 36.51 kg/m. Advised pt to lose weight. Advised patient to avoid trans fat, fatty and fried food. Follow a regular physical activity  schedule.           No orders of the defined types were placed in this encounter.    Follow-up: Return in about 4 months (around 04/17/2022).    Theresia Lo, NP

## 2021-12-16 NOTE — Assessment & Plan Note (Signed)
Stable without medication  

## 2021-12-16 NOTE — Assessment & Plan Note (Signed)
Patient is on hemodialysis. Blood pressure under control without any medications.

## 2021-12-22 ENCOUNTER — Ambulatory Visit: Payer: Medicare Other | Admitting: Nurse Practitioner

## 2022-03-06 DIAGNOSIS — T782XXA Anaphylactic shock, unspecified, initial encounter: Secondary | ICD-10-CM | POA: Insufficient documentation

## 2022-03-06 DIAGNOSIS — T7840XA Allergy, unspecified, initial encounter: Secondary | ICD-10-CM | POA: Insufficient documentation

## 2022-04-28 ENCOUNTER — Encounter: Payer: Self-pay | Admitting: Nurse Practitioner

## 2022-04-28 ENCOUNTER — Ambulatory Visit (INDEPENDENT_AMBULATORY_CARE_PROVIDER_SITE_OTHER): Payer: Medicare Other | Admitting: Nurse Practitioner

## 2022-04-28 VITALS — BP 136/84 | HR 86 | Ht 63.0 in | Wt 207.5 lb

## 2022-04-28 DIAGNOSIS — J069 Acute upper respiratory infection, unspecified: Secondary | ICD-10-CM | POA: Diagnosis not present

## 2022-04-28 DIAGNOSIS — Z23 Encounter for immunization: Secondary | ICD-10-CM

## 2022-04-28 MED ORDER — BENZONATATE 100 MG PO CAPS: 100.0000 mg | ORAL_CAPSULE | Freq: Two times a day (BID) | ORAL | 0 refills | Status: DC | PRN

## 2022-04-28 NOTE — Progress Notes (Unsigned)
Established Patient Office Visit  Subjective:  Patient ID: Jennifer Newman, female    DOB: [birth year]  Age: 56 y.o. MRN: [id number]  CC:  Chief Complaint  Patient presents with   Cough    Patient has had cough, congestion since 10-15. Patient had started to feel better then Monday of this week she started feeling bad again. Denies sore throat, body aches, fever.      HPI  [name] presents for:  Cough This is a new problem. The current episode started 1 to 4 weeks ago. The problem has been gradually improving. The problem occurs every few hours (worse and night). The cough is Productive of sputum. Associated symptoms include postnasal drip and a sore throat. Pertinent negatives include no fever or heartburn. The symptoms are aggravated by lying down. The treatment provided mild relief. There is no history of asthma, COPD or environmental allergies.     Past Medical History:  Diagnosis Date   Anxiety    Bipolar disorder ([place])    Chronic kidney disease    Depression    Dysrhythmia    tachycardia   GERD (gastroesophageal reflux disease)    Hypertension    Obsessive-compulsive disorder    PTSD (post-traumatic stress disorder)    Tachycardia, unspecified    Vertigo     Past Surgical History:  Procedure Laterality Date   A/V FISTULAGRAM Left 06/30/2019   Procedure: A/V FISTULAGRAM;  Surgeon: Algernon Huxley, MD;  Location: [facility];  Service: Cardiovascular;  Laterality: Left;   AV FISTULA PLACEMENT Left 02/26/2019   Procedure: ARTERIOVENOUS (AV) FISTULA CREATION ( RADIAL CEPHALIC );  Surgeon: Algernon Huxley, MD;  Location: [facility];  Service: Vascular;  Laterality: Left;   AV FISTULA PLACEMENT Left 04/16/2019   Procedure: ARTERIOVENOUS (AV) FISTULA CREATION ( BRACHIAL CEPHALIC );  Surgeon: Algernon Huxley, MD;  Location: [facility];  Service: Vascular;  Laterality: Left;   BREAST BIOPSY Right 12/07/2021   Stereo Bx ribbon clip- path pending   BREAST BIOPSY Left  12/07/2021   [place] Core Bx 1200 5cmfn ribbon clip- path pending   BREAST BIOPSY Left 12/07/2021   [place] Core BX 300 Coil clip - Path pending   DIALYSIS/PERMA CATHETER INSERTION N/A 08/22/2018   Procedure: DIALYSIS temporary catheter INSERTION;  Surgeon: Algernon Huxley, MD;  Location: [facility];  Service: Cardiovascular;  Laterality: N/A;   DIALYSIS/PERMA CATHETER INSERTION N/A 08/26/2018   Procedure: DIALYSIS/PERMA CATHETER INSERTION;  Surgeon: Algernon Huxley, MD;  Location: [facility] [facility];  Service: Cardiovascular;  Laterality: N/A;   DIALYSIS/PERMA CATHETER INSERTION N/A 01/01/2019   Procedure: DIALYSIS/PERMA CATHETER INSERTION;  Surgeon: Algernon Huxley, MD;  Location: [facility];  Service: Cardiovascular;  Laterality: N/A;   DIALYSIS/PERMA CATHETER REMOVAL N/A 12/29/2019   Procedure: DIALYSIS/PERMA CATHETER REMOVAL;  Surgeon: Algernon Huxley, MD;  Location: [facility];  Service: Cardiovascular;  Laterality: N/A;   FISTULA SUPERFICIALIZATION Left 10/29/2019   Procedure: FISTULA SUPERFICIALIZATION;  Surgeon: Algernon Huxley, MD;  Location: [facility];  Service: Vascular;  Laterality: Left;   none     PERIPHERAL VASCULAR THROMBECTOMY Left 03/12/2019   Procedure: PERIPHERAL VASCULAR THROMBECTOMY;  Surgeon: Algernon Huxley, MD;  Location: [facility];  Service: Cardiovascular;  Laterality: Left;   PERIPHERAL VASCULAR THROMBECTOMY Left 03/24/2020   Procedure: PERIPHERAL VASCULAR THROMBECTOMY;  Surgeon: Algernon Huxley, MD;  Location: [facility];  Service: Cardiovascular;  Laterality: Left;  Family History  Problem Relation Age of Onset   COPD Neg Hx    Diabetes Neg Hx    Hypertension Neg Hx    CAD Neg Hx     Social History   Socioeconomic History   Marital status: Married    Spouse name: Buyer, retail   Number of children: 2   Years of education: Not on file   Highest education level: Associate degree: occupational, Hotel manager, or vocational program   Occupational History   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    Comment: not employed  Tobacco Use   Smoking status: Never   Smokeless tobacco: Never  Vaping Use   Vaping Use: Never used  Substance and Sexual Activity   Alcohol use: Not Currently    Alcohol/week: 0.0 standard drinks of alcohol   Drug use: Not Currently    Types: Marijuana    Comment: > 73yr   Sexual activity: Not Currently    Birth control/protection: Post-menopausal  Other Topics Concern   Not on file  Social History Narrative   Not on file   Social Determinants of Health   Financial Resource Strain: Low Risk  (08/13/2017)   Overall Financial Resource Strain (CARDIA)    Difficulty of Paying Living Expenses: Not hard at all  Food Insecurity: No Food Insecurity (08/13/2017)   Hunger Vital Sign    Worried About Running Out of Food in the Last Year: Never true    Ran Out of Food in the Last Year: Never true  Transportation Needs: No Transportation Needs (08/13/2017)   PRAPARE - THydrologist(Medical): No    Lack of Transportation (Non-Medical): No  Physical Activity: Inactive (08/13/2017)   Exercise Vital Sign    Days of Exercise per Week: 0 days    Minutes of Exercise per Session: 0 min  Stress: Stress Concern Present (08/13/2017)   [place]   Feeling of Stress : Rather much  Social Connections: Moderately Isolated (08/13/2017)   Social Connection and Isolation Panel [NHANES]    Frequency of Communication with Friends and Family: Never    Frequency of Social Gatherings with Friends and Family: Never    Attends Religious Services: Never    AMarine scientistor Organizations: No    Attends CArchivistMeetings: Never    Marital Status: Married  IHuman resources officerViolence: Not At Risk (08/13/2017)   Humiliation, Afraid, Rape, and Kick questionnaire    Fear of Current or Ex-Partner:  No    Emotionally Abused: No    Physically Abused: No    Sexually Abused: No     Outpatient Medications Prior to Visit  Medication Sig Dispense Refill   sevelamer carbonate (RENVELA) 800 MG tablet Take 800 mg by mouth 3 (three) times daily.     No facility-administered medications prior to visit.    No Known Allergies  ROS Review of Systems  Constitutional:  Negative for fever.  HENT:  Positive for postnasal drip and sore throat.   Respiratory:  Positive for cough.   Gastrointestinal:  Negative for heartburn.  Allergic/Immunologic: Negative for environmental allergies.      Objective:    Physical Exam  BP 136/84   Pulse 86   Ht _0  (1.6 m)   Wt 207 lb 8 oz (94.1 kg)   LMP 08/31/2018   BMI 36.76 kg/m  Wt Readings from Last 3 Encounters:  04/28/22 207 lb 8  oz (94.1 kg)  12/16/21 206 lb 1.6 oz (93.5 kg)  12/09/21 203 lb 0.7 oz (92.1 kg)     Health Maintenance  Topic Date Due   Medicare Annual Wellness (AWV)  Never done   PAP SMEAR-Modifier  Never done   INFLUENZA VACCINE  01/31/2022   Zoster Vaccines- Shingrix (1 of 2) 07/29/2022 (Originally 11/15/2017)   COLONOSCOPY (Pts 45-39yr Insurance coverage will need to be confirmed)  08/19/2022 (Originally 11/15/2012)   TETANUS/TDAP  08/19/2022 (Originally 06/01/2021)   MAMMOGRAM  11/15/2023   Hepatitis C Screening  Completed   HIV Screening  Completed   HPV VACCINES  Aged Out   COVID-19 Vaccine  Discontinued    There are no preventive care reminders to display for this patient.  Lab Results  Component Value Date   TSH 0.74 08/22/2021   Lab Results  Component Value Date   WBC 8.7 08/22/2021   HGB 12.5 08/22/2021   HCT 38.6 08/22/2021   MCV 87.7 08/22/2021   PLT 315 08/22/2021   Lab Results  Component Value Date   NA 138 08/22/2021   K 3.7 08/22/2021   CO2 25 08/22/2021   GLUCOSE 100 (H) 08/22/2021   BUN 53 (H) 08/22/2021   CREATININE 3.48 (H) 08/22/2021   BILITOT 0.3 08/22/2021   ALKPHOS 197 (H)  10/06/2020   AST 28 08/22/2021   ALT 27 08/22/2021   PROT 7.4 08/22/2021   ALBUMIN 3.3 (L) 10/07/2020   CALCIUM 9.5 08/22/2021   ANIONGAP 10 10/08/2020   EGFR 15 (L) 08/22/2021   Lab Results  Component Value Date   CHOL 291 (H) 08/22/2021   Lab Results  Component Value Date   HDL 45 (L) 08/22/2021   Lab Results  Component Value Date   LDLCALC 199 (H) 08/22/2021   Lab Results  Component Value Date   TRIG 263 (H) 08/22/2021   Lab Results  Component Value Date   CHOLHDL 6.5 (H) 08/22/2021   No results found for: "HGBA1C"    Assessment & Plan:   Problem List Items Addressed This Visit   None Visit Diagnoses     Need for influenza vaccination    -  Primary        No orders of the defined types were placed in this encounter.    Follow-up: No follow-ups on file.    CTheresia Lo NP

## 2022-05-01 ENCOUNTER — Encounter (INDEPENDENT_AMBULATORY_CARE_PROVIDER_SITE_OTHER): Payer: Self-pay

## 2022-05-01 DIAGNOSIS — J069 Acute upper respiratory infection, unspecified: Secondary | ICD-10-CM | POA: Insufficient documentation

## 2022-05-01 NOTE — Assessment & Plan Note (Signed)
Symptomatic care discussed. Started her on benzonatate 100 mg as needed.

## 2022-06-08 ENCOUNTER — Other Ambulatory Visit (INDEPENDENT_AMBULATORY_CARE_PROVIDER_SITE_OTHER): Payer: Self-pay | Admitting: Nurse Practitioner

## 2022-06-08 DIAGNOSIS — N186 End stage renal disease: Secondary | ICD-10-CM

## 2022-06-09 ENCOUNTER — Ambulatory Visit (INDEPENDENT_AMBULATORY_CARE_PROVIDER_SITE_OTHER): Payer: Medicare Other | Admitting: Nurse Practitioner

## 2022-06-09 ENCOUNTER — Encounter (INDEPENDENT_AMBULATORY_CARE_PROVIDER_SITE_OTHER): Payer: Self-pay | Admitting: Nurse Practitioner

## 2022-06-09 ENCOUNTER — Ambulatory Visit (INDEPENDENT_AMBULATORY_CARE_PROVIDER_SITE_OTHER): Payer: Medicare Other

## 2022-06-09 VITALS — BP 136/83 | HR 69 | Resp 16 | Ht 63.0 in | Wt 203.0 lb

## 2022-06-09 DIAGNOSIS — I10 Essential (primary) hypertension: Secondary | ICD-10-CM | POA: Diagnosis not present

## 2022-06-09 DIAGNOSIS — N186 End stage renal disease: Secondary | ICD-10-CM

## 2022-06-09 DIAGNOSIS — E785 Hyperlipidemia, unspecified: Secondary | ICD-10-CM | POA: Diagnosis not present

## 2022-06-27 ENCOUNTER — Encounter (INDEPENDENT_AMBULATORY_CARE_PROVIDER_SITE_OTHER): Payer: Self-pay | Admitting: Nurse Practitioner

## 2022-06-27 NOTE — Progress Notes (Signed)
Subjective:    Patient ID: Jennifer Newman, female    DOB: 12/19/1965, 56 y.o.   MRN: [id number] Chief Complaint  Patient presents with   Follow-up    ultrasound    The patient returns to the office for followup of their dialysis access.   The patient reports the function of the access has been stable. Patient denies difficulty with cannulation. The patient denies increased bleeding time after removing the needles. The patient denies hand pain or other symptoms consistent with steal phenomena.  No significant arm swelling.  The patient denies any complaints from the dialysis center or their nephrologist.  The patient denies redness or swelling at the access site. The patient denies fever or chills at home or while on dialysis.  No recent shortening of the patient's walking distance or new symptoms consistent with claudication.  No history of rest pain symptoms. No new ulcers or wounds of the lower extremities have occurred.  The patient denies amaurosis fugax or recent TIA symptoms. There are no recent neurological changes noted. There is no history of DVT, PE or superficial thrombophlebitis. No recent episodes of angina or shortness of breath documented.   Duplex ultrasound of the AV access shows a patent access.  The previously noted stenosis is not significantly changed compared to last study.  Flow volume today is 3244 cc/min (previous flow volume was 3743 cc/min)      Review of Systems  Hematological:  Does not bruise/bleed easily.  All other systems reviewed and are negative.      Objective:   Physical Exam Vitals reviewed.  HENT:     Head: Normocephalic.  Cardiovascular:     Rate and Rhythm: Normal rate.     Pulses: Normal pulses.  Pulmonary:     Effort: Pulmonary effort is normal.  Skin:    General: Skin is warm and dry.  Neurological:     Mental Status: She is alert and oriented to person, place, and time.  Psychiatric:        Mood and Affect: Mood normal.         Behavior: Behavior normal.        Thought Content: Thought content normal.        Judgment: Judgment normal.     BP 136/83 (BP Location: Right Arm)   Pulse 69   Resp 16   Ht '5\' 3"'$  (1.6 m)   Wt 203 lb (92.1 kg)   LMP 08/31/2018   BMI 35.96 kg/m   Past Medical History:  Diagnosis Date   Anxiety    Bipolar disorder ([facility])    Chronic kidney disease    Depression    Dysrhythmia    tachycardia   GERD (gastroesophageal reflux disease)    Hypertension    Obsessive-compulsive disorder    PTSD (post-traumatic stress disorder)    Tachycardia, unspecified    Vertigo     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    Comment: not employed  Tobacco Use   Smoking status: Never   Smokeless tobacco: Never  Vaping Use   Vaping Use: Never used  Substance and Sexual Activity   Alcohol use: Not Currently    Alcohol/week: 0.0 standard drinks of  alcohol   Drug use: Not Currently    Types: Marijuana    Comment: > 79yr   Sexual activity: Not Currently    Birth control/protection: Post-menopausal  Other Topics Concern   Not on file  Social History Narrative   Not on file   Social Determinants of Health   Financial Resource Strain: Low Risk  (08/13/2017)   Overall Financial Resource Strain (CARDIA)    Difficulty of Paying Living Expenses: Not hard at all  Food Insecurity: No Food Insecurity (08/13/2017)   Hunger Vital Sign    Worried About Running Out of Food in the Last Year: Never true    Ran Out of Food in the Last Year: Never true  Transportation Needs: No Transportation Needs (08/13/2017)   PRAPARE - THydrologist(Medical): No    Lack of Transportation (Non-Medical):  No  Physical Activity: Inactive (08/13/2017)   Exercise Vital Sign    Days of Exercise per Week: 0 days    Minutes of Exercise per Session: 0 min  Stress: Stress Concern Present (08/13/2017)   [place]   Feeling of Stress : Rather much  Social Connections: Moderately Isolated (08/13/2017)   Social Connection and Isolation Panel [NHANES]    Frequency of Communication with Friends and Family: Never    Frequency of Social Gatherings with Friends and Family: Never    Attends Religious Services: Never    AMarine scientistor Organizations: No    Attends CArchivistMeetings: Never    Marital Status: Married  IHuman resources officerViolence: Not At Risk (08/13/2017)   Humiliation, Afraid, Rape, and Kick questionnaire    Fear of Current or Ex-Partner: No    Emotionally Abused: No    Physically Abused: No    Sexually Abused: No    Past Surgical History:  Procedure Laterality Date   A/V FISTULAGRAM Left 06/30/2019   Procedure: A/V FISTULAGRAM;  Surgeon: DAlgernon Huxley MD;  [facility];  Service: Cardiovascular;  Laterality: Left;   AV FISTULA PLACEMENT Left 02/26/2019   Procedure: ARTERIOVENOUS (AV) FISTULA CREATION ( RADIAL CEPHALIC );  Surgeon: DAlgernon Huxley MD;  Location: [facility];  Service: Vascular;  Laterality: Left;   AV FISTULA PLACEMENT Left 04/16/2019   Procedure: ARTERIOVENOUS (AV) FISTULA CREATION ( BRACHIAL CEPHALIC );  Surgeon: DAlgernon Huxley MD;  Location: [facility];  Service: Vascular;  Laterality: Left;   BREAST BIOPSY Right 12/07/2021   Stereo Bx ribbon clip- path pending   BREAST BIOPSY Left 12/07/2021   [place]Core Bx 1200 5cmfn ribbon clip- path pending   BREAST BIOPSY Left 12/07/2021   [place]Core BX 300 Coil clip - Path pending   DIALYSIS/PERMA CATHETER INSERTION N/A 08/22/2018   Procedure: DIALYSIS temporary catheter INSERTION;  Surgeon: DAlgernon Huxley MD;  [facility];  Service: Cardiovascular;  Laterality: N/A;   DIALYSIS/PERMA CATHETER INSERTION N/A 08/26/2018   Procedure: DIALYSIS/PERMA CATHETER INSERTION;  Surgeon: DAlgernon Huxley MD;  [facility];  Service: Cardiovascular;  Laterality: N/A;   DIALYSIS/PERMA CATHETER INSERTION N/A 01/01/2019   Procedure: DIALYSIS/PERMA CATHETER INSERTION;  Surgeon: DAlgernon Huxley MD;  [facility];  Service: Cardiovascular;  Laterality: N/A;   DIALYSIS/PERMA CATHETER REMOVAL N/A 12/29/2019   Procedure: DIALYSIS/PERMA CATHETER REMOVAL;  Surgeon: DAlgernon Huxley MD;  [facility];  Service: Cardiovascular;  Laterality: N/A;  FISTULA SUPERFICIALIZATION Left 10/29/2019   Procedure: FISTULA SUPERFICIALIZATION;  Surgeon: Algernon Huxley, MD;  Location: [facility];  Service: Vascular;  Laterality: Left;   none     PERIPHERAL VASCULAR THROMBECTOMY Left 03/12/2019   Procedure: PERIPHERAL VASCULAR THROMBECTOMY;  Surgeon: Algernon Huxley, MD;  Location: [facility];  Service: Cardiovascular;  Laterality: Left;   PERIPHERAL VASCULAR THROMBECTOMY Left 03/24/2020   Procedure: PERIPHERAL VASCULAR THROMBECTOMY;  Surgeon: Algernon Huxley, MD;  Location: [facility];  Service: Cardiovascular;  Laterality: Left;    Family History  Problem Relation Age of Onset   COPD Neg Hx    Diabetes Neg Hx    Hypertension Neg Hx    CAD Neg Hx     No Known Allergies     Latest Ref Rng & Units 08/22/2021   10:47 AM 10/07/2020    6:17 AM 10/06/2020    1:22 PM  CBC  WBC 3.8 - 10.8 Thousand/uL 8.7  5.8  9.9   Hemoglobin 11.7 - 15.5 g/dL 12.5  10.7  13.5   Hematocrit 35.0 - 45.0 % 38.6  32.0  39.5   Platelets 140 - 400 Thousand/uL 315  205  271       CMP     Component Value Date/Time   NA 138 08/22/2021 1047   NA 135 (L) 10/09/2013 1548   K 3.7 08/22/2021 1047   K 3.6 10/09/2013 1548   CL 94 (L) 08/22/2021 1047   CL 106 10/09/2013 1548   CO2 25 08/22/2021 1047   CO2 23  10/09/2013 1548   GLUCOSE 100 (H) 08/22/2021 1047   GLUCOSE 84 10/09/2013 1548   BUN 53 (H) 08/22/2021 1047   BUN 16 10/09/2013 1548   CREATININE 3.48 (H) 08/22/2021 1047   CALCIUM 9.5 08/22/2021 1047   CALCIUM 8.9 10/09/2013 1548   PROT 7.4 08/22/2021 1047   PROT 8.6 (H) 10/09/2013 1548   ALBUMIN 3.3 (L) 10/07/2020 0617   ALBUMIN 4.0 10/09/2013 1548   AST 28 08/22/2021 1047   AST 36 10/09/2013 1548   ALT 27 08/22/2021 1047   ALT 38 10/09/2013 1548   ALKPHOS 197 (H) 10/06/2020 1322   ALKPHOS 118 (H) 10/09/2013 1548   BILITOT 0.3 08/22/2021 1047   BILITOT 0.3 10/09/2013 1548   GFRNONAA 26 (L) 10/08/2020 0448   GFRNONAA >60 10/09/2013 1548   GFRAA 15 (L) 10/20/2019 1200   GFRAA >60 10/09/2013 1548     No results found.     Assessment & Plan:   1. ESRD (end stage renal disease) ([place]) Recommend:  The patient is doing well and currently has adequate dialysis access. The patient's dialysis center is not reporting any access issues. Flow pattern is stable when compared to the prior ultrasound.  The patient should have a duplex ultrasound of the dialysis access in 6 months. The patient will follow-up with me in the office after each ultrasound   - VAS [place] (AVF, AVG)  2. Essential hypertension Continue antihypertensive medications as already ordered, these medications have been reviewed and there are no changes at this time.  3. Hyperlipidemia, unspecified hyperlipidemia type Continue statin as ordered and reviewed, no changes at this time   Current Outpatient Medications on File Prior to Visit  Medication Sig Dispense Refill   benzonatate (TESSALON) 100 MG capsule Take 1 capsule (100 mg total) by mouth 2 (two) times daily as needed for cough. (Patient not taking: Reported on 06/09/2022) 20 capsule 0  sevelamer carbonate (RENVELA) 800 MG tablet Take 800 mg by mouth 3 (three) times daily. (Patient not taking: Reported on 06/09/2022)     No current  facility-administered medications on file prior to visit.    There are no Patient Instructions on file for this visit. No follow-ups on file.   Kris Hartmann, NP

## 2022-07-17 DIAGNOSIS — M3131 Wegener's granulomatosis with renal involvement: Secondary | ICD-10-CM | POA: Insufficient documentation

## 2022-08-11 ENCOUNTER — Other Ambulatory Visit: Payer: Self-pay | Admitting: Rheumatology

## 2022-08-11 DIAGNOSIS — M3130 Wegener's granulomatosis without renal involvement: Secondary | ICD-10-CM

## 2022-08-17 ENCOUNTER — Ambulatory Visit (INDEPENDENT_AMBULATORY_CARE_PROVIDER_SITE_OTHER): Payer: Medicare Other | Admitting: Nurse Practitioner

## 2022-08-17 ENCOUNTER — Encounter: Payer: Self-pay | Admitting: Nurse Practitioner

## 2022-08-17 VITALS — BP 120/70 | HR 83 | Temp 97.9°F | Ht 63.0 in | Wt 203.8 lb

## 2022-08-17 DIAGNOSIS — N6322 Unspecified lump in the left breast, upper inner quadrant: Secondary | ICD-10-CM | POA: Diagnosis not present

## 2022-08-17 DIAGNOSIS — Z992 Dependence on renal dialysis: Secondary | ICD-10-CM

## 2022-08-17 DIAGNOSIS — N186 End stage renal disease: Secondary | ICD-10-CM | POA: Diagnosis not present

## 2022-08-17 DIAGNOSIS — Z1211 Encounter for screening for malignant neoplasm of colon: Secondary | ICD-10-CM

## 2022-08-17 DIAGNOSIS — I10 Essential (primary) hypertension: Secondary | ICD-10-CM

## 2022-08-17 DIAGNOSIS — N63 Unspecified lump in unspecified breast: Secondary | ICD-10-CM

## 2022-08-17 NOTE — Progress Notes (Signed)
Established Patient Office Visit  Subjective:  Patient ID: Jennifer Newman, female    DOB: [birth year]  Age: 57 y.o. MRN: [id number]  CC:  Chief Complaint  Patient presents with   Transitions Of Care    Kidney transplant candidate     HPI  Jennifer Newman presents for a routine follow up. She is on the list for kidney transplant and would like to do the the preventive screening  She is on HD T,Th,S. She is overall doing well. No complaints at present.    HPI   Past Medical History:  Diagnosis Date   Anxiety    Bipolar disorder ([place])    Chronic kidney disease    Depression    Dysrhythmia    tachycardia   GERD (gastroesophageal reflux disease)    Hypertension    Obsessive-compulsive disorder    PTSD (post-traumatic stress disorder)    Tachycardia, unspecified    Vertigo     Past Surgical History:  Procedure Laterality Date   A/V FISTULAGRAM Left 06/30/2019   Procedure: A/V FISTULAGRAM;  Surgeon: Algernon Huxley, MD;  Location: [place] [facility];  Service: Cardiovascular;  Laterality: Left;   AV FISTULA PLACEMENT Left 02/26/2019   Procedure: ARTERIOVENOUS (AV) FISTULA CREATION ( RADIAL CEPHALIC );  Surgeon: Algernon Huxley, MD;  Location: [facility];  Service: Vascular;  Laterality: Left;   AV FISTULA PLACEMENT Left 04/16/2019   Procedure: ARTERIOVENOUS (AV) FISTULA CREATION ( BRACHIAL CEPHALIC );  Surgeon: Algernon Huxley, MD;  Location: [facility];  Service: Vascular;  Laterality: Left;   BREAST BIOPSY Right 12/07/2021   Stereo Bx ribbon clip- path pending   BREAST BIOPSY Left 12/07/2021   [place] Core Bx 1200 5cmfn ribbon clip- path pending   BREAST BIOPSY Left 12/07/2021   [place] Core BX 300 Coil clip - Path pending   DIALYSIS/PERMA CATHETER INSERTION N/A 08/22/2018   Procedure: DIALYSIS temporary catheter INSERTION;  Surgeon: Algernon Huxley, MD;  Location: [place] [facility];  Service: Cardiovascular;  Laterality: N/A;   DIALYSIS/PERMA CATHETER INSERTION N/A 08/26/2018    Procedure: DIALYSIS/PERMA CATHETER INSERTION;  Surgeon: Algernon Huxley, MD;  Location: [place] [facility];  Service: Cardiovascular;  Laterality: N/A;   DIALYSIS/PERMA CATHETER INSERTION N/A 01/01/2019   Procedure: DIALYSIS/PERMA CATHETER INSERTION;  Surgeon: Algernon Huxley, MD;  Location: [place] [facility];  Service: Cardiovascular;  Laterality: N/A;   DIALYSIS/PERMA CATHETER REMOVAL N/A 12/29/2019   Procedure: DIALYSIS/PERMA CATHETER REMOVAL;  Surgeon: Algernon Huxley, MD;  Location: [place] [facility];  Service: Cardiovascular;  Laterality: N/A;   FISTULA SUPERFICIALIZATION Left 10/29/2019   Procedure: FISTULA SUPERFICIALIZATION;  Surgeon: Algernon Huxley, MD;  Location: [facility];  Service: Vascular;  Laterality: Left;   none     PERIPHERAL VASCULAR THROMBECTOMY Left 03/12/2019   Procedure: PERIPHERAL VASCULAR THROMBECTOMY;  Surgeon: Algernon Huxley, MD;  Location: [place] [facility];  Service: Cardiovascular;  Laterality: Left;   PERIPHERAL VASCULAR THROMBECTOMY Left 03/24/2020   Procedure: PERIPHERAL VASCULAR THROMBECTOMY;  Surgeon: Algernon Huxley, MD;  Location: [place] [facility];  Service: Cardiovascular;  Laterality: Left;    Family History  Problem Relation Age of Onset   COPD Neg Hx    Diabetes Neg Hx    Hypertension Neg Hx    CAD Neg Hx     Social History   Socioeconomic History   Marital status: Married    Spouse name: Corene Cornea   Number of children: 2  Years of education: Not on file   Highest education level: Associate degree: occupational, Hotel manager, or vocational program  Occupational History   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    Comment: not employed  Tobacco Use   Smoking status: Never   Smokeless tobacco: Never  Vaping Use   Vaping Use: Never used  Substance and Sexual Activity   Alcohol use: Not Currently    Alcohol/week: 0.0 standard drinks of alcohol   Drug use: Not Currently    Types: Marijuana    Comment: > 27yr   Sexual activity:  Not Currently    Birth control/protection: Post-menopausal  Other Topics Concern   Not on file  Social History Narrative   Not on file   Social Determinants of Health   Financial Resource Strain: Low Risk  (08/13/2017)   Overall Financial Resource Strain (CARDIA)    Difficulty of Paying Living Expenses: Not hard at all  Food Insecurity: No Food Insecurity (08/13/2017)   Hunger Vital Sign    Worried About Running Out of Food in the Last Year: Never true    Ran Out of Food in the Last Year: Never true  Transportation Needs: No Transportation Needs (08/13/2017)   PRAPARE - THydrologist(Medical): No    Lack of Transportation (Non-Medical): No  Physical Activity: Inactive (08/13/2017)   Exercise Vital Sign    Days of Exercise per Week: 0 days    Minutes of Exercise per Session: 0 min  Stress: Stress Concern Present (08/13/2017)   FH[place]   Feeling of Stress : Rather much  Social Connections: Moderately Isolated (08/13/2017)   Social Connection and Isolation Panel [NHANES]    Frequency of Communication with Friends and Family: Never    Frequency of Social Gatherings with Friends and Family: Never    Attends Religious Services: Never    AMarine scientistor Organizations: No    Attends CArchivistMeetings: Never    Marital Status: Married  IHuman resources officerViolence: Not At Risk (08/13/2017)   Humiliation, Afraid, Rape, and Kick questionnaire    Fear of Current or Ex-Partner: No    Emotionally Abused: No    Physically Abused: No    Sexually Abused: No     Outpatient Medications Prior to Visit  Medication Sig Dispense Refill   benzonatate (TESSALON) 100 MG capsule Take 1 capsule (100 mg total) by mouth 2 (two) times daily as needed for cough. 20 capsule 0   losartan (COZAAR) 50 MG tablet Take by mouth.     sevelamer carbonate (RENVELA) 800 MG tablet Take 800 mg by mouth 3  (three) times daily. (Patient not taking: Reported on 08/17/2022)     No facility-administered medications prior to visit.    No Known Allergies  ROS Review of Systems  Constitutional:  Negative for activity change and appetite change.  HENT:  Negative for congestion, facial swelling and sinus pain.   Eyes: Negative.   Respiratory:  Negative for cough, shortness of breath and wheezing.   Cardiovascular:  Negative for chest pain and palpitations.  Gastrointestinal: Negative.   Genitourinary: Negative.   Musculoskeletal: Negative.   Skin: Negative.   Neurological:  Negative for dizziness, light-headedness and headaches.  Psychiatric/Behavioral:  Negative for agitation, behavioral problems and confusion.       Objective:    Physical Exam Constitutional:      Appearance: Normal appearance. She is obese.  HENT:     Right Ear: Tympanic membrane normal.     Left Ear: Tympanic membrane normal.     Nose: Nose normal.     Mouth/Throat:     Mouth: Mucous membranes are moist.  Eyes:     Pupils: Pupils are equal, round, and reactive to light.  Cardiovascular:     Rate and Rhythm: Normal rate and regular rhythm.     Pulses: Normal pulses.     Heart sounds: Normal heart sounds.  Pulmonary:     Effort: Pulmonary effort is normal.     Breath sounds: Normal breath sounds.  Abdominal:     General: Bowel sounds are normal.     Palpations: Abdomen is soft.  Musculoskeletal:        General: Normal range of motion.     Cervical back: Normal range of motion.  Skin:    General: Skin is warm.     Capillary Refill: Capillary refill takes less than 2 seconds.  Neurological:     General: No focal deficit present.     Mental Status: She is alert and oriented to person, place, and time. Mental status is at baseline.  Psychiatric:        Mood and Affect: Mood normal.        Behavior: Behavior normal.        Thought Content: Thought content normal.        Judgment: Judgment normal.      BP 120/70   Pulse 83   Temp 97.9 F (36.6 C) (Oral)   Ht '5\' 3"'$  (1.6 m)   Wt 203 lb 12.8 oz (92.4 kg)   LMP 08/31/2018   SpO2 97%   BMI 36.10 kg/m  Wt Readings from Last 3 Encounters:  08/18/22 203 lb 3.2 oz (92.2 kg)  08/17/22 203 lb 12.8 oz (92.4 kg)  06/09/22 203 lb (92.1 kg)     Health Maintenance Due  Topic Date Due   Medicare Annual Wellness (AWV)  Never done   COLONOSCOPY (Pts 45-50yr Insurance coverage will need to be confirmed)  Never done   Zoster Vaccines- Shingrix (1 of 2) Never done   DTaP/Tdap/Td (2 - Td or Tdap) 06/01/2021    There are no preventive care reminders to display for this patient.  Lab Results  Component Value Date   TSH 0.74 08/22/2021   Lab Results  Component Value Date   WBC 8.7 08/22/2021   HGB 12.5 08/22/2021   HCT 38.6 08/22/2021   MCV 87.7 08/22/2021   PLT 315 08/22/2021   Lab Results  Component Value Date   NA 138 08/22/2021   K 3.7 08/22/2021   CO2 25 08/22/2021   GLUCOSE 100 (H) 08/22/2021   BUN 53 (H) 08/22/2021   CREATININE 3.48 (H) 08/22/2021   BILITOT 0.3 08/22/2021   ALKPHOS 197 (H) 10/06/2020   AST 28 08/22/2021   ALT 27 08/22/2021   PROT 7.4 08/22/2021   ALBUMIN 3.3 (L) 10/07/2020   CALCIUM 9.5 08/22/2021   ANIONGAP 10 10/08/2020   EGFR 15 (L) 08/22/2021   Lab Results  Component Value Date   CHOL 291 (H) 08/22/2021   Lab Results  Component Value Date   HDL 45 (L) 08/22/2021   Lab Results  Component Value Date   LDLCALC 199 (H) 08/22/2021   Lab Results  Component Value Date   TRIG 263 (H) 08/22/2021   Lab Results  Component Value Date   CHOLHDL 6.5 (H) 08/22/2021   No  results found for: "HGBA1C"    Assessment & Plan:   Problem List Items Addressed This Visit       Cardiovascular and Mediastinum   Essential hypertension    Patient BP  Vitals:   08/17/22 1417  BP: 120/70    in the office . Blood pressure diet-controlled, no medication.         Genitourinary   ESRD on  dialysis ([place])    Stable at present on hemodialysis.        Other   Mass of upper inner quadrant of left breast - Primary    Mammogram ordered      Relevant Orders   MM DIAG BREAST TOMO BILATERAL   US BREAST LTD UNI LEFT INC AXILLA   Other Visit Diagnoses     Encounter for screening colonoscopy       Relevant Orders   Cologuard      No orders of the defined types were placed in this encounter.    Follow-up: No follow-ups on file.    Theresia Lo, NP

## 2022-08-17 NOTE — Patient Instructions (Addendum)
Ordered mammogram, [place] and cologuard. Please schedule for PAP.

## 2022-08-18 ENCOUNTER — Ambulatory Visit (INDEPENDENT_AMBULATORY_CARE_PROVIDER_SITE_OTHER): Payer: Medicare Other | Admitting: Nurse Practitioner

## 2022-08-18 ENCOUNTER — Encounter: Payer: Self-pay | Admitting: Nurse Practitioner

## 2022-08-18 ENCOUNTER — Other Ambulatory Visit (HOSPITAL_COMMUNITY)
Admission: RE | Admit: 2022-08-18 | Discharge: 2022-08-18 | Disposition: A | Discharge status: Home / Self Care | Payer: Medicare Other | Source: Ambulatory Visit | Attending: Nurse Practitioner | Admitting: Nurse Practitioner

## 2022-08-18 VITALS — BP 120/70 | HR 72 | Temp 98.1°F | Ht 63.0 in | Wt 203.2 lb

## 2022-08-18 DIAGNOSIS — Z124 Encounter for screening for malignant neoplasm of cervix: Secondary | ICD-10-CM | POA: Diagnosis not present

## 2022-08-18 NOTE — Patient Instructions (Addendum)
PAP spear performed today. Routine care. Will follow up in 4 months.

## 2022-08-18 NOTE — Progress Notes (Signed)
Established Patient Office Visit  Subjective:  Patient ID: Jennifer Newman, female    DOB: [birth year]  Age: 57 y.o. MRN: [id number]  CC:  Chief Complaint  Patient presents with   Gynecologic Exam    Pap only     HPI  [name] presents for PAP smear. No other complaint at present.   Gynecologic Exam Pertinent negatives include no headaches.     Past Medical History:  Diagnosis Date   Anxiety    Bipolar disorder ([place])    Chronic kidney disease    Depression    Dysrhythmia    tachycardia   GERD (gastroesophageal reflux disease)    Hypertension    Obsessive-compulsive disorder    PTSD (post-traumatic stress disorder)    Tachycardia, unspecified    Vertigo     Past Surgical History:  Procedure Laterality Date   A/V FISTULAGRAM Left 06/30/2019   Procedure: A/V FISTULAGRAM;  Surgeon: Algernon Huxley, MD;  Location: [place] [facility];  Service: Cardiovascular;  Laterality: Left;   AV FISTULA PLACEMENT Left 02/26/2019   Procedure: ARTERIOVENOUS (AV) FISTULA CREATION ( RADIAL CEPHALIC );  Surgeon: Algernon Huxley, MD;  Location: [facility];  Service: Vascular;  Laterality: Left;   AV FISTULA PLACEMENT Left 04/16/2019   Procedure: ARTERIOVENOUS (AV) FISTULA CREATION ( BRACHIAL CEPHALIC );  Surgeon: Algernon Huxley, MD;  Location: [facility];  Service: Vascular;  Laterality: Left;   BREAST BIOPSY Right 12/07/2021   Stereo Bx ribbon clip- path pending   BREAST BIOPSY Left 12/07/2021   [place] Core Bx 1200 5cmfn ribbon clip- path pending   BREAST BIOPSY Left 12/07/2021   [place] Core BX 300 Coil clip - Path pending   DIALYSIS/PERMA CATHETER INSERTION N/A 08/22/2018   Procedure: DIALYSIS temporary catheter INSERTION;  Surgeon: Algernon Huxley, MD;  Location: [place] [facility];  Service: Cardiovascular;  Laterality: N/A;   DIALYSIS/PERMA CATHETER INSERTION N/A 08/26/2018   Procedure: DIALYSIS/PERMA CATHETER INSERTION;  Surgeon: Algernon Huxley, MD;  Location: [place] [facility];  Service:  Cardiovascular;  Laterality: N/A;   DIALYSIS/PERMA CATHETER INSERTION N/A 01/01/2019   Procedure: DIALYSIS/PERMA CATHETER INSERTION;  Surgeon: Algernon Huxley, MD;  Location: [place] [facility];  Service: Cardiovascular;  Laterality: N/A;   DIALYSIS/PERMA CATHETER REMOVAL N/A 12/29/2019   Procedure: DIALYSIS/PERMA CATHETER REMOVAL;  Surgeon: Algernon Huxley, MD;  Location: [place] [facility];  Service: Cardiovascular;  Laterality: N/A;   FISTULA SUPERFICIALIZATION Left 10/29/2019   Procedure: FISTULA SUPERFICIALIZATION;  Surgeon: Algernon Huxley, MD;  Location: [facility];  Service: Vascular;  Laterality: Left;   none     PERIPHERAL VASCULAR THROMBECTOMY Left 03/12/2019   Procedure: PERIPHERAL VASCULAR THROMBECTOMY;  Surgeon: Algernon Huxley, MD;  Location: [place] [facility];  Service: Cardiovascular;  Laterality: Left;   PERIPHERAL VASCULAR THROMBECTOMY Left 03/24/2020   Procedure: PERIPHERAL VASCULAR THROMBECTOMY;  Surgeon: Algernon Huxley, MD;  Location: [place] [facility];  Service: Cardiovascular;  Laterality: Left;    Family History  Problem Relation Age of Onset   COPD Neg Hx    Diabetes Neg Hx    Hypertension Neg Hx    CAD Neg Hx     Social History   Socioeconomic History   Marital status: Married    Spouse name: jason   Number of children: 2   Years of education: Not on file   Highest education level: Associate degree: occupational, Hotel manager, or vocational program  Occupational History  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    Comment: not employed  Tobacco Use   Smoking status: Never   Smokeless tobacco: Never  Vaping Use   Vaping Use: Never used  Substance and Sexual Activity   Alcohol use: Not Currently    Alcohol/week: 0.0 standard drinks of alcohol   Drug use: Not Currently    Types: Marijuana    Comment: > 56yr   Sexual activity: Not Currently    Birth control/protection: Post-menopausal  Other Topics Concern   Not on file  Social History  Narrative   Not on file   Social Determinants of Health   Financial Resource Strain: Low Risk  (08/13/2017)   Overall Financial Resource Strain (CARDIA)    Difficulty of Paying Living Expenses: Not hard at all  Food Insecurity: No Food Insecurity (08/13/2017)   Hunger Vital Sign    Worried About Running Out of Food in the Last Year: Never true    Ran Out of Food in the Last Year: Never true  Transportation Needs: No Transportation Needs (08/13/2017)   PRAPARE - THydrologist(Medical): No    Lack of Transportation (Non-Medical): No  Physical Activity: Inactive (08/13/2017)   Exercise Vital Sign    Days of Exercise per Week: 0 days    Minutes of Exercise per Session: 0 min  Stress: Stress Concern Present (08/13/2017)   [place]   Feeling of Stress : Rather much  Social Connections: Moderately Isolated (08/13/2017)   Social Connection and Isolation Panel [NHANES]    Frequency of Communication with Friends and Family: Never    Frequency of Social Gatherings with Friends and Family: Never    Attends Religious Services: Never    AMarine scientistor Organizations: No    Attends CArchivistMeetings: Never    Marital Status: Married  IHuman resources officerViolence: Not At Risk (08/13/2017)   Humiliation, Afraid, Rape, and Kick questionnaire    Fear of Current or Ex-Partner: No    Emotionally Abused: No    Physically Abused: No    Sexually Abused: No     No outpatient medications prior to visit.   No facility-administered medications prior to visit.    No Known Allergies  ROS Review of Systems  Constitutional:  Negative for activity change and appetite change.  HENT:  Negative for congestion, facial swelling and sinus pain.   Eyes: Negative.   Respiratory:  Negative for cough, shortness of breath and wheezing.   Cardiovascular:  Negative for chest pain and palpitations.   Gastrointestinal: Negative.   Genitourinary: Negative.   Musculoskeletal: Negative.   Skin: Negative.   Neurological:  Negative for dizziness, light-headedness and headaches.  Psychiatric/Behavioral:  Negative for agitation, behavioral problems and confusion.       Objective:    Physical Exam Exam conducted with a chaperone present.  Constitutional:      Appearance: Normal appearance. She is obese.  Eyes:     Pupils: Pupils are equal, round, and reactive to light.  Cardiovascular:     Rate and Rhythm: Normal rate and regular rhythm.     Pulses: Normal pulses.     Heart sounds: Normal heart sounds.  Pulmonary:     Effort: Pulmonary effort is normal.     Breath sounds: Normal breath sounds.  Abdominal:     General: Bowel sounds are normal.     Palpations: Abdomen is soft.  Hernia: There is no hernia in the right inguinal area.  Genitourinary:    Exam position: Lithotomy position.     Labia:        Right: No tenderness.        Left: No tenderness.      Urethra: No prolapse or urethral lesion.     Comments: Cervix normal in appearance.  No cervical motion tenderness. Lymphadenopathy:     Lower Body: No left inguinal adenopathy.  Skin:    General: Skin is warm.     Capillary Refill: Capillary refill takes less than 2 seconds.  Neurological:     General: No focal deficit present.     Mental Status: She is alert and oriented to person, place, and time. Mental status is at baseline.  Psychiatric:        Mood and Affect: Mood normal.        Behavior: Behavior normal.        Thought Content: Thought content normal.        Judgment: Judgment normal.     BP 120/70   Pulse 72   Temp 98.1 F (36.7 C) (Oral)   Ht '5\' 3"'$  (1.6 m)   Wt 203 lb 3.2 oz (92.2 kg)   LMP 08/31/2018   SpO2 96%   BMI 36.00 kg/m  Wt Readings from Last 3 Encounters:  08/18/22 203 lb 3.2 oz (92.2 kg)  08/17/22 203 lb 12.8 oz (92.4 kg)  06/09/22 203 lb (92.1 kg)     Health Maintenance Due   Topic Date Due   Medicare Annual Wellness (AWV)  Never done   COLONOSCOPY (Pts 45-70yr Insurance coverage will need to be confirmed)  Never done   Zoster Vaccines- Shingrix (1 of 2) Never done   DTaP/Tdap/Td (2 - Td or Tdap) 06/01/2021    There are no preventive care reminders to display for this patient.  Lab Results  Component Value Date   TSH 0.74 08/22/2021   Lab Results  Component Value Date   WBC 8.7 08/22/2021   HGB 12.5 08/22/2021   HCT 38.6 08/22/2021   MCV 87.7 08/22/2021   PLT 315 08/22/2021   Lab Results  Component Value Date   NA 138 08/22/2021   K 3.7 08/22/2021   CO2 25 08/22/2021   GLUCOSE 100 (H) 08/22/2021   BUN 53 (H) 08/22/2021   CREATININE 3.48 (H) 08/22/2021   BILITOT 0.3 08/22/2021   ALKPHOS 197 (H) 10/06/2020   AST 28 08/22/2021   ALT 27 08/22/2021   PROT 7.4 08/22/2021   ALBUMIN 3.3 (L) 10/07/2020   CALCIUM 9.5 08/22/2021   ANIONGAP 10 10/08/2020   EGFR 15 (L) 08/22/2021   Lab Results  Component Value Date   CHOL 291 (H) 08/22/2021   Lab Results  Component Value Date   HDL 45 (L) 08/22/2021   Lab Results  Component Value Date   LDLCALC 199 (H) 08/22/2021   Lab Results  Component Value Date   TRIG 263 (H) 08/22/2021   Lab Results  Component Value Date   CHOLHDL 6.5 (H) 08/22/2021   No results found for: "HGBA1C"    Assessment & Plan:   Problem List Items Addressed This Visit       Other   Cervical cancer screening - Primary    Pap smear ordered. Routine care discussed.       Relevant Orders   Cytology - PAP( [place]) (Completed)    No orders of the defined types were placed  in this encounter.    Follow-up: Return in about 4 months (around 12/17/2022).    Theresia Lo, NP

## 2022-08-21 ENCOUNTER — Ambulatory Visit
Admission: RE | Admit: 2022-08-21 | Discharge: 2022-08-21 | Disposition: A | Discharge status: Home / Self Care | Payer: Medicare Other | Source: Ambulatory Visit | Attending: Rheumatology | Admitting: Rheumatology

## 2022-08-21 DIAGNOSIS — M3130 Wegener's granulomatosis without renal involvement: Secondary | ICD-10-CM

## 2022-08-21 LAB — CYTOLOGY - PAP
Adequacy: ABSENT
Diagnosis: NEGATIVE

## 2022-08-28 ENCOUNTER — Encounter: Payer: Self-pay | Admitting: Nurse Practitioner

## 2022-08-28 DIAGNOSIS — Z124 Encounter for screening for malignant neoplasm of cervix: Secondary | ICD-10-CM | POA: Insufficient documentation

## 2022-08-28 DIAGNOSIS — N6322 Unspecified lump in the left breast, upper inner quadrant: Secondary | ICD-10-CM | POA: Insufficient documentation

## 2022-08-28 NOTE — Assessment & Plan Note (Signed)
Stable at present on hemodialysis.

## 2022-08-28 NOTE — Assessment & Plan Note (Addendum)
Pap smear ordered. Routine care discussed.

## 2022-08-28 NOTE — Assessment & Plan Note (Signed)
Mammogram ordered

## 2022-08-28 NOTE — Assessment & Plan Note (Signed)
Patient BP  Vitals:   08/17/22 1417  BP: 120/70    in the office . Blood pressure diet-controlled, no medication.

## 2022-09-02 ENCOUNTER — Other Ambulatory Visit: Payer: Self-pay

## 2022-09-02 ENCOUNTER — Emergency Department
Admission: EM | Admit: 2022-09-02 | Discharge: 2022-09-02 | Disposition: A | Discharge status: Home / Self Care | Payer: Medicare Other | Attending: Emergency Medicine | Admitting: Emergency Medicine

## 2022-09-02 DIAGNOSIS — Z20822 Contact with and (suspected) exposure to covid-19: Secondary | ICD-10-CM | POA: Insufficient documentation

## 2022-09-02 DIAGNOSIS — J101 Influenza due to other identified influenza virus with other respiratory manifestations: Secondary | ICD-10-CM

## 2022-09-02 DIAGNOSIS — I129 Hypertensive chronic kidney disease with stage 1 through stage 4 chronic kidney disease, or unspecified chronic kidney disease: Secondary | ICD-10-CM | POA: Insufficient documentation

## 2022-09-02 DIAGNOSIS — N189 Chronic kidney disease, unspecified: Secondary | ICD-10-CM | POA: Diagnosis not present

## 2022-09-02 DIAGNOSIS — R509 Fever, unspecified: Secondary | ICD-10-CM | POA: Diagnosis present

## 2022-09-02 LAB — RESP PANEL BY RT-PCR (RSV, FLU A&B, COVID)  RVPGX2
Influenza A by PCR: POSITIVE — AB
Influenza B by PCR: NEGATIVE
Resp Syncytial Virus by PCR: NEGATIVE
SARS Coronavirus 2 by RT PCR: NEGATIVE

## 2022-09-02 MED ORDER — PSEUDOEPH-BROMPHEN-DM 30-2-10 MG/5ML PO SYRP: 5.0000 mL | ORAL_SOLUTION | Freq: Four times a day (QID) | ORAL | 0 refills | Status: DC | PRN

## 2022-09-02 MED ORDER — OSELTAMIVIR PHOSPHATE 75 MG PO CAPS: 75.0000 mg | ORAL_CAPSULE | Freq: Two times a day (BID) | ORAL | 0 refills | Status: AC

## 2022-09-02 MED ORDER — ONDANSETRON 4 MG PO TBDP
4.0000 mg | ORAL_TABLET | Freq: Once | ORAL | Status: AC
  Administered 2022-09-02: 4 mg via ORAL

## 2022-09-02 MED ORDER — ONDANSETRON 4 MG PO TBDP: 4.0000 mg | ORAL_TABLET | Freq: Three times a day (TID) | ORAL | 0 refills | Status: DC | PRN

## 2022-09-02 MED ORDER — ONDANSETRON 4 MG PO TBDP
ORAL_TABLET | ORAL | Status: AC
  Filled 2022-09-02: qty 1

## 2022-09-02 NOTE — Discharge Instructions (Signed)
Follow-up with your regular doctor if not improved in 3 days.  Return if worsening.  Take your medication as prescribed.  Also take Tylenol and ibuprofen for fever and bodyaches as needed.  Drink plenty of fluids.

## 2022-09-02 NOTE — ED Provider Notes (Signed)
[facility] Provider Note    Event Date/Time   First MD Initiated Contact with Patient 09/02/22 1300     (approximate)   History   Nausea, Generalized Body Aches, and Chills   HPI  Jennifer Newman is a 56 y.o. female with history of hypertension, dysrhythmia, CKD presents emergency department with complaints of fever, chills, body aches cough and nausea that started this morning.  She denies chest pain or shortness of breath.  No diarrhea.      Physical Exam   Triage Vital Signs: ED Triage Vitals  Enc Vitals Group     BP 09/02/22 1253 (!) 134/90     Pulse Rate 09/02/22 1253 99     Resp 09/02/22 1253 17     Temp 09/02/22 1253 (!) 100.8 F (38.2 C)     Temp Source 09/02/22 1253 Oral     SpO2 09/02/22 1253 97 %     Weight 09/02/22 1251 190 lb (86.2 kg)     Height 09/02/22 1251 '5\' 3"'$  (1.6 m)     Head Circumference --      Peak Flow --      Pain Score 09/02/22 1250 5     Pain Loc --      Pain Edu? --      Excl. in [place]? --     Most recent vital signs: Vitals:   09/02/22 1253  BP: (!) 134/90  Pulse: 99  Resp: 17  Temp: (!) 100.8 F (38.2 C)  SpO2: 97%     General: Awake, no distress.   CV:  Good peripheral perfusion. regular rate and  rhythm Resp:  Normal effort. Lungs cta Abd:  No distention.  Nontender Other:      ED Results / Procedures / Treatments   Labs (all labs ordered are listed, but only abnormal results are displayed) Labs Reviewed  RESP PANEL BY RT-PCR (RSV, FLU A&B, COVID)  [id number] - Abnormal; Notable for the following components:      Result Value   Influenza A by PCR POSITIVE (*)    All other components within normal limits     EKG     RADIOLOGY     PROCEDURES:   Procedures   MEDICATIONS ORDERED IN ED: Medications  ondansetron (ZOFRAN-ODT) 4 MG disintegrating tablet (  Not Given 09/02/22 1344)  ondansetron (ZOFRAN-ODT) disintegrating tablet 4 mg (4 mg Oral Given 09/02/22 1309)     IMPRESSION /  MDM / [place] / ED COURSE  I reviewed the triage vital signs and the nursing notes.                              Differential diagnosis includes, but is not limited to, COVID, influenza, RSV, viral illness  Patient's presentation is most consistent with acute complicated illness / injury requiring diagnostic workup.   Patient was given Zofran in triage for nausea. Respiratory panel positive for influenza A  I did explain the findings to the patient.  Explained to her that she has the flu.  She is to take Tylenol or ibuprofen for fever as needed.  She was given a prescription for Zofran ODT as this helped with her nausea.  Prescription for Bromfed cough syrup.  She is to drink plenty of fluids.  Follow-up with her regular doctor if not improving in 3 to 4 days.  Return if worsening.  Patient is in agreement treatment plan.  Was discharged stable condition.      FINAL CLINICAL IMPRESSION(S) / ED DIAGNOSES   Final diagnoses:  Influenza A     Rx / DC Orders   ED Discharge Orders          Ordered    oseltamivir (TAMIFLU) 75 MG capsule  2 times daily        09/02/22 1424    brompheniramine-pseudoephedrine-DM 30-2-10 MG/5ML syrup  4 times daily PRN        09/02/22 1424    ondansetron (ZOFRAN-ODT) 4 MG disintegrating tablet  Every 8 hours PRN        09/02/22 1424             Note:  This document was prepared using Dragon voice recognition software and may include unintentional dictation errors.    Versie Starks, PA-C 09/02/22 1426    Naaman Plummer, MD 09/02/22 [phone]

## 2022-09-02 NOTE — ED Triage Notes (Addendum)
Pt to ED POV for body aches, chills and nausea since this AM. Denies illness exposure. Unlabored respirations, dry skin.   L arm restricted. Full dialysis session this AM.

## 2022-09-06 ENCOUNTER — Telehealth: Payer: Self-pay

## 2022-09-06 NOTE — Telephone Encounter (Signed)
        Patient  visited [facility] on 09/02/2022  for Nausea  Generalized Body Aches  Chills.   Telephone encounter attempt :  1st  A HIPAA compliant voice message was left requesting a return call.  Instructed patient to call back at [phone].   [place] Resource Care Guide   ??[email or web address]  ?? [id number]   Website: [email or web address]  [email or web address]

## 2022-09-07 ENCOUNTER — Telehealth: Payer: Self-pay

## 2022-09-07 NOTE — Telephone Encounter (Signed)
        Patient  visited [facility] on 09/02/2022  for Nausea  Generalized Body Aches  Chills.   Telephone encounter attempt :  2nd  A HIPAA compliant voice message was left requesting a return call.  Instructed patient to call back at [phone].   [place] Resource Care Guide   ??[email or web address]  ?? [id number]   Website: [email or web address]  [email or web address]

## 2022-09-08 LAB — COLOGUARD: COLOGUARD: NEGATIVE

## 2022-09-12 ENCOUNTER — Telehealth: Payer: Self-pay

## 2022-09-12 NOTE — Telephone Encounter (Signed)
     Patient  visit on 09/02/2022  at [facility] was for Nausea  Generalized Body Aches  Chills.  Have you been able to follow up with your primary care physician? Yes  The patient was or was not able to obtain any needed medicine or equipment. Patient was able to obtain medication.  Are there diet recommendations that you are having difficulty following? No  Patient expresses understanding of discharge instructions and education provided has no other needs at this time. Yes   [place] Resource Care Guide   ??[email or web address]  ?? [phone]   Website: [email or web address]  [email or web address]

## 2022-09-21 ENCOUNTER — Other Ambulatory Visit: Payer: Self-pay | Admitting: Rheumatology

## 2022-09-21 DIAGNOSIS — M3131 Wegener's granulomatosis with renal involvement: Secondary | ICD-10-CM

## 2022-09-29 ENCOUNTER — Ambulatory Visit
Admission: RE | Admit: 2022-09-29 | Discharge: 2022-09-29 | Disposition: A | Discharge status: Home / Self Care | Payer: Medicare Other | Source: Ambulatory Visit | Attending: Nurse Practitioner | Admitting: Nurse Practitioner

## 2022-09-29 DIAGNOSIS — N6322 Unspecified lump in the left breast, upper inner quadrant: Secondary | ICD-10-CM

## 2022-09-30 ENCOUNTER — Other Ambulatory Visit: Payer: Self-pay | Admitting: Nurse Practitioner

## 2022-09-30 DIAGNOSIS — R921 Mammographic calcification found on diagnostic imaging of breast: Secondary | ICD-10-CM

## 2022-10-03 NOTE — Progress Notes (Signed)
PAP result normal

## 2022-10-04 ENCOUNTER — Ambulatory Visit
Admission: RE | Admit: 2022-10-04 | Discharge: 2022-10-04 | Disposition: A | Discharge status: Home / Self Care | Payer: Medicare Other | Source: Ambulatory Visit | Attending: Rheumatology | Admitting: Rheumatology

## 2022-10-04 DIAGNOSIS — M3131 Wegener's granulomatosis with renal involvement: Secondary | ICD-10-CM

## 2022-10-09 ENCOUNTER — Telehealth: Payer: Self-pay | Admitting: Nurse Practitioner

## 2022-10-09 NOTE — Telephone Encounter (Signed)
Contacted Jennifer Newman to schedule their annual wellness visit. Appointment made for 10/12/2022.  Thank you,  [facility] Support [facility] Direct dial  [phone]

## 2022-10-12 ENCOUNTER — Ambulatory Visit (INDEPENDENT_AMBULATORY_CARE_PROVIDER_SITE_OTHER): Payer: Medicare Other | Admitting: *Deleted

## 2022-10-12 ENCOUNTER — Ambulatory Visit: Payer: Medicare Other

## 2022-10-12 DIAGNOSIS — Z0000 Encounter for general adult medical examination without abnormal findings: Secondary | ICD-10-CM | POA: Diagnosis not present

## 2022-10-12 NOTE — Patient Instructions (Signed)
Jennifer Newman , Thank you for taking time to come for your Medicare Wellness Visit. I appreciate your ongoing commitment to your health goals. Please review the following plan we discussed and let me know if I can assist you in the future.   Screening recommendations/referrals: Colonoscopy: up to date  Mammogram: Education provided  Recommended yearly ophthalmology/optometry visit for glaucoma screening and checkup Recommended yearly dental visit for hygiene and checkup  Vaccinations: Influenza vaccine: up to date  Tdap vaccine: Education provided Shingles vaccine: Education provided    Advanced directives: Education provided      Preventive Care 55 Years and Older, Female Preventive care refers to lifestyle choices and visits with your health care provider that can promote health and wellness. What does preventive care include? A yearly physical exam. This is also called an annual well check. Dental exams once or twice a year. Routine eye exams. Ask your health care provider how often you should have your eyes checked. Personal lifestyle choices, including: Daily care of your teeth and gums. Regular physical activity. Eating a healthy diet. Avoiding tobacco and drug use. Limiting alcohol use. Practicing safe sex. Taking low-dose aspirin every day. Taking vitamin and mineral supplements as recommended by your health care provider. What happens during an annual well check? The services and screenings done by your health care provider during your annual well check will depend on your age, overall health, lifestyle risk factors, and family history of disease. Counseling  Your health care provider may ask you questions about your: Alcohol use. Tobacco use. Drug use. Emotional well-being. Home and relationship well-being. Sexual activity. Eating habits. History of falls. Memory and ability to understand (cognition). Work and work Astronomer. Reproductive  health. Screening  You may have the following tests or measurements: Height, weight, and BMI. Blood pressure. Lipid and cholesterol levels. These may be checked every 5 years, or more frequently if you are over 46 years old. Skin check. Lung cancer screening. You may have this screening every year starting at age 55 if you have a 30-pack-year history of smoking and currently smoke or have quit within the past 15 years. Fecal occult blood test (FOBT) of the stool. You may have this test every year starting at age 55. Flexible sigmoidoscopy or colonoscopy. You may have a sigmoidoscopy every 5 years or a colonoscopy every 10 years starting at age 55. Hepatitis C blood test. Hepatitis B blood test. Sexually transmitted disease (STD) testing. Diabetes screening. This is done by checking your blood sugar (glucose) after you have not eaten for a while (fasting). You may have this done every 1-3 years. Bone density scan. This is done to screen for osteoporosis. You may have this done starting at age 26. Mammogram. This may be done every 1-2 years. Talk to your health care provider about how often you should have regular mammograms. Talk with your health care provider about your test results, treatment options, and if necessary, the need for more tests. Vaccines  Your health care provider may recommend certain vaccines, such as: Influenza vaccine. This is recommended every year. Tetanus, diphtheria, and acellular pertussis (Tdap, Td) vaccine. You may need a Td booster every 10 years. Zoster vaccine. You may need this after age 51. Pneumococcal 13-valent conjugate (PCV13) vaccine. One dose is recommended after age 45. Pneumococcal polysaccharide (PPSV23) vaccine. One dose is recommended after age 3. Talk to your health care provider about which screenings and vaccines you need and how often you need them. This information is not  intended to replace advice given to you by your health care provider.  Make sure you discuss any questions you have with your health care provider. Document Released: 07/16/2015 Document Revised: 03/08/2016 Document Reviewed: 04/20/2015 Elsevier Interactive Patient Education  2017 [place] Prevention in the Home Falls can cause injuries. They can happen to people of all ages. There are many things you can do to make your home safe and to help prevent falls. What can I do on the outside of my home? Regularly fix the edges of walkways and driveways and fix any cracks. Remove anything that might make you trip as you walk through a door, such as a raised step or threshold. Trim any bushes or trees on the path to your home. Use bright outdoor lighting. Clear any walking paths of anything that might make someone trip, such as rocks or tools. Regularly check to see if handrails are loose or broken. Make sure that both sides of any steps have handrails. Any raised decks and porches should have guardrails on the edges. Have any leaves, snow, or ice cleared regularly. Use sand or salt on walking paths during winter. Clean up any spills in your garage right away. This includes oil or grease spills. What can I do in the bathroom? Use night lights. Install grab bars by the toilet and in the tub and shower. Do not use towel bars as grab bars. Use non-skid mats or decals in the tub or shower. If you need to sit down in the shower, use a plastic, non-slip stool. Keep the floor dry. Clean up any water that spills on the floor as soon as it happens. Remove soap buildup in the tub or shower regularly. Attach bath mats securely with double-sided non-slip rug tape. Do not have throw rugs and other things on the floor that can make you trip. What can I do in the bedroom? Use night lights. Make sure that you have a light by your bed that is easy to reach. Do not use any sheets or blankets that are too big for your bed. They should not hang down onto the floor. Have a  firm chair that has side arms. You can use this for support while you get dressed. Do not have throw rugs and other things on the floor that can make you trip. What can I do in the kitchen? Clean up any spills right away. Avoid walking on wet floors. Keep items that you use a lot in easy-to-reach places. If you need to reach something above you, use a strong step stool that has a grab bar. Keep electrical cords out of the way. Do not use floor polish or wax that makes floors slippery. If you must use wax, use non-skid floor wax. Do not have throw rugs and other things on the floor that can make you trip. What can I do with my stairs? Do not leave any items on the stairs. Make sure that there are handrails on both sides of the stairs and use them. Fix handrails that are broken or loose. Make sure that handrails are as long as the stairways. Check any carpeting to make sure that it is firmly attached to the stairs. Fix any carpet that is loose or worn. Avoid having throw rugs at the top or bottom of the stairs. If you do have throw rugs, attach them to the floor with carpet tape. Make sure that you have a light switch at the top of the stairs  and the bottom of the stairs. If you do not have them, ask someone to add them for you. What else can I do to help prevent falls? Wear shoes that: Do not have high heels. Have rubber bottoms. Are comfortable and fit you well. Are closed at the toe. Do not wear sandals. If you use a stepladder: Make sure that it is fully opened. Do not climb a closed stepladder. Make sure that both sides of the stepladder are locked into place. Ask someone to hold it for you, if possible. Clearly mark and make sure that you can see: Any grab bars or handrails. First and last steps. Where the edge of each step is. Use tools that help you move around (mobility aids) if they are needed. These include: Canes. Walkers. Scooters. Crutches. Turn on the lights when you  go into a dark area. Replace any light bulbs as soon as they burn out. Set up your furniture so you have a clear path. Avoid moving your furniture around. If any of your floors are uneven, fix them. If there are any pets around you, be aware of where they are. Review your medicines with your doctor. Some medicines can make you feel dizzy. This can increase your chance of falling. Ask your doctor what other things that you can do to help prevent falls. This information is not intended to replace advice given to you by your health care provider. Make sure you discuss any questions you have with your health care provider. Document Released: 04/15/2009 Document Revised: 11/25/2015 Document Reviewed: 07/24/2014 Elsevier Interactive Patient Education  2017 [employer].

## 2022-10-12 NOTE — Progress Notes (Signed)
Subjective:   Jennifer Newman is a 57 y.o. female who presents for an Initial Medicare Annual Wellness Visit.  I connected with  Jennifer RobinsonsLeslie J Bucher on 10/12/22 by a telephone enabled telemedicine application and verified that I am speaking with the correct person using two identifiers.   I discussed the limitations of evaluation and management by telemedicine. The patient expressed understanding and agreed to proceed.  Patient location: home  Provider location: telephone/home    Review of Systems     Cardiac Risk Factors include: advanced age (>4055men, 64>65 women);family history of premature cardiovascular disease;obesity (BMI >30kg/m2)     Objective:    Today's Vitals   There is no height or weight on file to calculate BMI.     10/12/2022    3:13 PM 10/12/2022    3:11 PM 09/02/2022   12:52 PM 10/07/2020   12:33 AM 10/06/2020    1:21 PM 03/24/2020   11:10 AM 10/17/2019    2:28 PM  Advanced Directives  Does Patient Have a Medical Advance Directive? No No No  No No No  Would patient like information on creating a medical advance directive? No - Patient declined No - Patient declined  No - Patient declined  No - Patient declined No - Patient declined    Current Medications (verified) Outpatient Encounter Medications as of 10/12/2022  Medication Sig   brompheniramine-pseudoephedrine-DM 30-2-10 MG/5ML syrup Take 5 mLs by mouth 4 (four) times daily as needed. (Patient not taking: Reported on 10/12/2022)   ondansetron (ZOFRAN-ODT) 4 MG disintegrating tablet Take 1 tablet (4 mg total) by mouth every 8 (eight) hours as needed. (Patient not taking: Reported on 10/12/2022)   No facility-administered encounter medications on file as of 10/12/2022.    Allergies (verified) Cat hair extract   History: Past Medical History:  Diagnosis Date   Anxiety    Bipolar disorder    Chronic kidney disease    Depression    Dysrhythmia    tachycardia   GERD (gastroesophageal reflux disease)     Hypertension    Obsessive-compulsive disorder    PTSD (post-traumatic stress disorder)    Tachycardia, unspecified    Vertigo    Past Surgical History:  Procedure Laterality Date   A/V FISTULAGRAM Left 06/30/2019   Procedure: A/V FISTULAGRAM;  Surgeon: Annice Needyew, Jason S, MD;  Location: [facility] INVASIVE CV LAB;  Service: Cardiovascular;  Laterality: Left;   AV FISTULA PLACEMENT Left 02/26/2019   Procedure: ARTERIOVENOUS (AV) FISTULA CREATION ( RADIAL CEPHALIC );  Surgeon: Annice Needyew, Jason S, MD;  Location: [facility] ORS;  Service: Vascular;  Laterality: Left;   AV FISTULA PLACEMENT Left 04/16/2019   Procedure: ARTERIOVENOUS (AV) FISTULA CREATION ( BRACHIAL CEPHALIC );  Surgeon: Annice Needyew, Jason S, MD;  Location: [facility] ORS;  Service: Vascular;  Laterality: Left;   BREAST BIOPSY Right 12/07/2021   Stereo Bx ribbon clip- path pending   BREAST BIOPSY Left 12/07/2021   [place] Core Bx 1200 5cmfn ribbon clip- path pending   BREAST BIOPSY Left 12/07/2021   US Core BX 300 Coil clip - Path pending   DIALYSIS/PERMA CATHETER INSERTION N/A 08/22/2018   Procedure: DIALYSIS temporary catheter INSERTION;  Surgeon: Annice Needyew, Jason S, MD;  Location: [facility];  Service: Cardiovascular;  Laterality: N/A;   DIALYSIS/PERMA CATHETER INSERTION N/A 08/26/2018   Procedure: DIALYSIS/PERMA CATHETER INSERTION;  Surgeon: Annice Needyew, Jason S, MD;  Location: [facility];  Service: Cardiovascular;  Laterality: N/A;   DIALYSIS/PERMA CATHETER INSERTION N/A 01/01/2019  Procedure: DIALYSIS/PERMA CATHETER INSERTION;  Surgeon: Annice Needy, MD;  Location: [facility];  Service: Cardiovascular;  Laterality: N/A;   DIALYSIS/PERMA CATHETER REMOVAL N/A 12/29/2019   Procedure: DIALYSIS/PERMA CATHETER REMOVAL;  Surgeon: Annice Needy, MD;  Location: [facility];  Service: Cardiovascular;  Laterality: N/A;   FISTULA SUPERFICIALIZATION Left 10/29/2019   Procedure: FISTULA SUPERFICIALIZATION;  Surgeon: Annice Needy, MD;  Location: [facility]  ORS;  Service: Vascular;  Laterality: Left;   none     PERIPHERAL VASCULAR THROMBECTOMY Left 03/12/2019   Procedure: PERIPHERAL VASCULAR THROMBECTOMY;  Surgeon: Annice Needy, MD;  Location: [facility] INV[facility];  Service: Cardiovascular;  Laterality: Left;   PERIPHERAL VASCULAR THROMBECTOMY Left 03/24/2020   Procedure: PERIPHERAL VASCULAR THROMBECTOMY;  Surgeon: Annice Needy, MD;  Location: [facility];  Service: Cardiovascular;  Laterality: Left;   Family History  Problem Relation Age of Onset   Breast cancer Maternal Grandmother    COPD Neg Hx    Diabetes Neg Hx    Hypertension Neg Hx    CAD Neg Hx    Social History   Socioeconomic History   Marital status: Married    Spouse name: Ambulance person   Number of children: 2   Years of education: Not on file   Highest education level: Associate degree: occupational, Scientist, product/process development, or vocational program  Occupational History   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    Comment: not employed  Tobacco Use   Smoking status: Never   Smokeless tobacco: Never  Vaping Use   Vaping Use: Never used  Substance and Sexual Activity   Alcohol use: Not Currently    Alcohol/week: 0.0 standard drinks of alcohol   Drug use: Not Currently    Types: Marijuana    Comment: > 69yrs   Sexual activity: Not Currently    Birth control/protection: Post-menopausal  Other Topics Concern   Not on file  Social History Narrative   Not on file   Social Determinants of Health   Financial Resource Strain: Low Risk  (10/12/2022)   Overall Financial Resource Strain (CARDIA)    Difficulty of Paying Living Expenses: Not hard at all  Food Insecurity: No Food Insecurity (10/12/2022)   Hunger Vital Sign    Worried About Running Out of Food in the Last Year: Never true    Ran Out of Food in the Last Year: Never true  Transportation Needs: No Transportation Needs (10/12/2022)   PRAPARE - Administrator, Civil Service (Medical): No    Lack  of Transportation (Non-Medical): No  Physical Activity: Inactive (10/12/2022)   Exercise Vital Sign    Days of Exercise per Week: 0 days    Minutes of Exercise per Session: 0 min  Stress: No Stress Concern Present (10/12/2022)   [employer] of Occupational Health - Occupational Stress Questionnaire    Feeling of Stress : Not at all  Social Connections: Socially Integrated (10/12/2022)   Social Connection and Isolation Panel [NHANES]    Frequency of Communication with Friends and Family: Twice a week    Frequency of Social Gatherings with Friends and Family: Once a week    Attends Religious Services: More than 4 times per year    Active Member of [employer] or Organizations: Yes    Attends Engineer, structural: More than 4 times per year    Marital Status: Married    Tobacco Counseling Counseling given: Not Answered   Clinical Intake:  Pre-visit  preparation completed: Yes  Pain : No/denies pain        How often do you need to have someone help you when you read instructions, pamphlets, or other written materials from your doctor or pharmacy?: 1 - Never  Diabetic?  no  Interpreter Needed?: No  Information entered by :: Remi Haggard LPN   Activities of Daily Living    10/12/2022    3:15 PM  In your present state of health, do you have any difficulty performing the following activities:  Hearing? 0  Vision? 0  Difficulty concentrating or making decisions? 0  Walking or climbing stairs? 0  Dressing or bathing? 0  Doing errands, shopping? 0  Preparing Food and eating ? N  Using the Toilet? N  In the past six months, have you accidently leaked urine? N  Do you have problems with loss of bowel control? N  Managing your Medications? N  Managing your Finances? N  Housekeeping or managing your Housekeeping? N    Patient Care Team: Kara Dies, NP as PCP - General (Nurse Practitioner) Debbe Odea, MD as PCP - Cardiology (Cardiology)  Indicate any  recent Medical Services you may have received from other than [facility] providers in the past year (date may be approximate).     Assessment:   This is a routine wellness examination for [place].  Hearing/Vision screen Hearing Screening - Comments:: No trouble hearing Vision Screening - Comments:: Not up to date Unsure of name  Dietary issues and exercise activities discussed: Current Exercise Habits: The patient does not participate in regular exercise at present   Goals Addressed             This Visit's Progress    Patient Stated       Would like to get on the transplant list       Depression Screen    10/12/2022    3:18 PM 08/17/2022    2:22 PM 08/19/2021   10:11 AM  PHQ 2/9 Scores  PHQ - 2 Score 0 0 0  PHQ- 9 Score 1  2    Fall Risk    10/12/2022    3:10 PM 08/17/2022    2:21 PM 08/19/2021   10:09 AM  Fall Risk   Falls in the past year? 0 0 0  Number falls in past yr: 0 0 0  Injury with Fall? 0 0 0  Risk for fall due to :  No Fall Risks No Fall Risks  Follow up Falls evaluation completed;Education provided;Falls prevention discussed Falls evaluation completed Falls evaluation completed    FALL RISK PREVENTION PERTAINING TO THE HOME:  Any stairs in or around the home? Yes  If so, are there any without handrails? No  Home free of loose throw rugs in walkways, pet beds, electrical cords, etc? Yes  Adequate lighting in your home to reduce risk of falls? Yes   ASSISTIVE DEVICES UTILIZED TO PREVENT FALLS:  Life alert? No  Use of a cane, walker or w/c? No  Grab bars in the bathroom? No  Shower chair or bench in shower? No  Elevated toilet seat or a handicapped toilet? No   TIMED UP AND GO:  Was the test performed? No .    Cognitive Function:        10/12/2022    3:13 PM  6CIT Screen  What Year? 0 points  What month? 0 points  What time? 0 points  Count back from 20 0 points  Months in  reverse 0 points  Repeat phrase 0 points  Total Score 0 points     Immunizations Immunization History  Administered Date(s) Administered   Hepatitis B, ADULT 10/12/2018, 11/14/2018, 12/05/2018, 03/06/2019   Hepb-cpg 10/12/2018, 11/14/2018, 12/05/2018, 03/06/2019, 01/08/2020, 02/05/2020, 03/06/2020, 05/06/2020   Influenza,inj,Quad PF,6+ Mos 08/08/2016   Influenza-Unspecified 08/08/2016, 02/03/2019   PPD Test 06/02/2011   Pneumococcal Conjugate-13 09/24/2019   Tdap 06/02/2011    TDAP status: Due, Education has been provided regarding the importance of this vaccine. Advised may receive this vaccine at local pharmacy or Health Dept. Aware to provide a copy of the vaccination record if obtained from local pharmacy or Health Dept. Verbalized acceptance and understanding.  Flu Vaccine status: Up to date   Covid-19 vaccine status: Information provided on how to obtain vaccines.   Qualifies for Shingles Vaccine? Yes   Zostavax completed No   Shingrix Completed?: No.    Education has been provided regarding the importance of this vaccine. Patient has been advised to call insurance company to determine out of pocket expense if they have not yet received this vaccine. Advised may also receive vaccine at local pharmacy or Health Dept. Verbalized acceptance and understanding.  Screening Tests Health Maintenance  Topic Date Due   COLONOSCOPY (Pts 45-90yrs Insurance coverage will need to be confirmed)  Never done   DTaP/Tdap/Td (2 - Td or Tdap) 06/01/2021   Zoster Vaccines- Shingrix (1 of 2) 01/11/2023 (Originally 11/15/2017)   INFLUENZA VACCINE  02/01/2023   Medicare Annual Wellness (AWV)  10/12/2023   MAMMOGRAM  09/28/2024   PAP SMEAR-Modifier  08/18/2025   Hepatitis C Screening  Completed   HIV Screening  Completed   HPV VACCINES  Aged Out   COVID-19 Vaccine  Discontinued    Health Maintenance  Health Maintenance Due  Topic Date Due   COLONOSCOPY (Pts 45-65yrs Insurance coverage will need to be confirmed)  Never done   DTaP/Tdap/Td (2 - Td or  Tdap) 06/01/2021    Colorectal cancer screening: Type of screening: Cologuard. Completed 2024. Repeat every 3 years  Mammogram status: Ordered  . Pt provided with contact info and advised to call to schedule appt.     Lung Cancer Screening: (Low Dose CT Chest recommended if Age 23-80 years, 30 pack-year currently smoking OR have quit w/in 15years.) does not qualify.   Lung Cancer Screening Referral:   Additional Screening:  Hepatitis C Screening: does not qualify; Completed 2020  Vision Screening: Recommended annual ophthalmology exams for early detection of glaucoma and other disorders of the eye. Is the patient up to date with their annual eye exam?  No  Who is the provider or what is the name of the office in which the patient attends annual eye exams? Unsure of name If pt is not established with a provider, would they like to be referred to a provider to establish care? No .   Dental Screening: Recommended annual dental exams for proper oral hygiene  Community Resource Referral / Chronic Care Management: CRR required this visit?  No   CCM required this visit?  No      Plan:     I have personally reviewed and noted the following in the patient's chart:   Medical and social history Use of alcohol, tobacco or illicit drugs  Current medications and supplements including opioid prescriptions. Patient is not currently taking opioid prescriptions. Functional ability and status Nutritional status Physical activity Advanced directives List of other physicians Hospitalizations, surgeries, and ER visits in previous 12  months Vitals Screenings to include cognitive, depression, and falls Referrals and appointments  In addition, I have reviewed and discussed with patient certain preventive protocols, quality metrics, and best practice recommendations. A written personalized care plan for preventive services as well as general preventive health recommendations were provided to  patient.     Remi Haggard, LPN   07/21/1476   Nurse Notes:

## 2022-11-21 ENCOUNTER — Encounter: Payer: Self-pay | Admitting: Nurse Practitioner

## 2022-11-21 NOTE — Telephone Encounter (Signed)
If okay I will fill out everything but your signature.

## 2022-11-21 NOTE — Telephone Encounter (Signed)
It is okay.  Thank you 

## 2022-11-30 NOTE — Telephone Encounter (Signed)
Placed in tray for signature, license number and reason for need.

## 2022-11-30 NOTE — Telephone Encounter (Signed)
Signed and ready for pickup 

## 2022-12-05 ENCOUNTER — Other Ambulatory Visit (INDEPENDENT_AMBULATORY_CARE_PROVIDER_SITE_OTHER): Payer: Self-pay | Admitting: Nurse Practitioner

## 2022-12-05 DIAGNOSIS — N186 End stage renal disease: Secondary | ICD-10-CM

## 2022-12-08 ENCOUNTER — Ambulatory Visit (INDEPENDENT_AMBULATORY_CARE_PROVIDER_SITE_OTHER): Payer: Medicare Other | Admitting: Vascular Surgery

## 2022-12-08 ENCOUNTER — Encounter (INDEPENDENT_AMBULATORY_CARE_PROVIDER_SITE_OTHER): Payer: Self-pay | Admitting: Vascular Surgery

## 2022-12-08 ENCOUNTER — Ambulatory Visit (INDEPENDENT_AMBULATORY_CARE_PROVIDER_SITE_OTHER): Payer: Medicare Other

## 2022-12-08 VITALS — BP 135/83 | HR 69 | Resp 18 | Ht 63.0 in | Wt 205.8 lb

## 2022-12-08 DIAGNOSIS — I10 Essential (primary) hypertension: Secondary | ICD-10-CM

## 2022-12-08 DIAGNOSIS — N186 End stage renal disease: Secondary | ICD-10-CM

## 2022-12-08 DIAGNOSIS — Z992 Dependence on renal dialysis: Secondary | ICD-10-CM | POA: Diagnosis not present

## 2022-12-08 NOTE — Assessment & Plan Note (Signed)
BP better on dialysis now.

## 2022-12-08 NOTE — Progress Notes (Signed)
MRN : [id number]  Jennifer Newman is a 57 y.o. ([birth year]) female who presents with chief complaint of  Chief Complaint  Patient presents with   Follow-up    f/u in 6 months with HDA  .  History of Present Illness: Patient returns today in follow up of her dialysis access.  The last intervention on this access was in 2021.  It has been working well for her dialysis access for several years now.  She is working on getting on the transplant list.  They are really not having any major issues with her access with dialysis.  Duplex today shows it to be widely patent.  No current outpatient medications on file.   No current facility-administered medications for this visit.    Past Medical History:  Diagnosis Date   Anxiety    Bipolar disorder (HCC)    Chronic kidney disease    Depression    Dysrhythmia    tachycardia   GERD (gastroesophageal reflux disease)    Hypertension    Obsessive-compulsive disorder    PTSD (post-traumatic stress disorder)    Tachycardia, unspecified    Vertigo     Past Surgical History:  Procedure Laterality Date   A/V FISTULAGRAM Left 06/30/2019   Procedure: A/V FISTULAGRAM;  Surgeon: Annice Needy, MD;  Location: [facility];  Service: Cardiovascular;  Laterality: Left;   AV FISTULA PLACEMENT Left 02/26/2019   Procedure: ARTERIOVENOUS (AV) FISTULA CREATION ( RADIAL CEPHALIC );  Surgeon: Annice Needy, MD;  Location: [facility];  Service: Vascular;  Laterality: Left;   AV FISTULA PLACEMENT Left 04/16/2019   Procedure: ARTERIOVENOUS (AV) FISTULA CREATION ( BRACHIAL CEPHALIC );  Surgeon: Annice Needy, MD;  Location: [facility];  Service: Vascular;  Laterality: Left;   BREAST BIOPSY Right 12/07/2021   Stereo Bx ribbon clip- path pending   BREAST BIOPSY Left 12/07/2021   [place] Core Bx 1200 5cmfn ribbon clip- path pending   BREAST BIOPSY Left 12/07/2021   [place] Core BX 300 Coil clip - Path pending   DIALYSIS/PERMA CATHETER INSERTION N/A 08/22/2018    Procedure: DIALYSIS temporary catheter INSERTION;  Surgeon: Annice Needy, MD;  Location: [facility];  Service: Cardiovascular;  Laterality: N/A;   DIALYSIS/PERMA CATHETER INSERTION N/A 08/26/2018   Procedure: DIALYSIS/PERMA CATHETER INSERTION;  Surgeon: Annice Needy, MD;  Location: [facility];  Service: Cardiovascular;  Laterality: N/A;   DIALYSIS/PERMA CATHETER INSERTION N/A 01/01/2019   Procedure: DIALYSIS/PERMA CATHETER INSERTION;  Surgeon: Annice Needy, MD;  Location: [facility];  Service: Cardiovascular;  Laterality: N/A;   DIALYSIS/PERMA CATHETER REMOVAL N/A 12/29/2019   Procedure: DIALYSIS/PERMA CATHETER REMOVAL;  Surgeon: Annice Needy, MD;  Location: [facility];  Service: Cardiovascular;  Laterality: N/A;   FISTULA SUPERFICIALIZATION Left 10/29/2019   Procedure: FISTULA SUPERFICIALIZATION;  Surgeon: Annice Needy, MD;  Location: [facility];  Service: Vascular;  Laterality: Left;   none     PERIPHERAL VASCULAR THROMBECTOMY Left 03/12/2019   Procedure: PERIPHERAL VASCULAR THROMBECTOMY;  Surgeon: Annice Needy, MD;  Location: [facility];  Service: Cardiovascular;  Laterality: Left;   PERIPHERAL VASCULAR THROMBECTOMY Left 03/24/2020   Procedure: PERIPHERAL VASCULAR THROMBECTOMY;  Surgeon: Annice Needy, MD;  Location: [facility];  Service: Cardiovascular;  Laterality: Left;     Social History   Tobacco Use   Smoking status: Never   Smokeless tobacco: Never  Vaping Use   Vaping Use:  Never used  Substance Use Topics   Alcohol use: Not Currently    Alcohol/week: 0.0 standard drinks of alcohol   Drug use: Not Currently    Types: Marijuana    Comment: > 15yrs      Family History  Problem Relation Age of Onset   Breast cancer Maternal Grandmother    COPD Neg Hx    Diabetes Neg Hx    Hypertension Neg Hx    CAD Neg Hx      Allergies  Allergen Reactions   Cat Hair Extract Hives     REVIEW OF SYSTEMS (Negative unless  checked)  Constitutional: [] Weight loss  [] Fever  [] Chills Cardiac: [] Chest pain   [] Chest pressure   [x] Palpitations   [] Shortness of breath when laying flat   [] Shortness of breath at rest   [] Shortness of breath with exertion. Vascular:  [] Pain in legs with walking   [] Pain in legs at rest   [] Pain in legs when laying flat   [] Claudication   [] Pain in feet when walking  [] Pain in feet at rest  [] Pain in feet when laying flat   [] History of DVT   [] Phlebitis   [] Swelling in legs   [] Varicose veins   [] Non-healing ulcers Pulmonary:   [] Uses home oxygen   [] Productive cough   [] Hemoptysis   [] Wheeze  [] COPD   [] Asthma Neurologic:  [] Dizziness  [] Blackouts   [] Seizures   [] History of stroke   [] History of TIA  [] Aphasia   [] Temporary blindness   [] Dysphagia   [] Weakness or numbness in arms   [] Weakness or numbness in legs Musculoskeletal:  [x] Arthritis   [] Joint swelling   [] Joint pain   [] Low back pain Hematologic:  [] Easy bruising  [] Easy bleeding   [] Hypercoagulable state   [x] Anemic   Gastrointestinal:  [] Blood in stool   [] Vomiting blood  [] Gastroesophageal reflux/heartburn   [] Abdominal pain Genitourinary:  [x] Chronic kidney disease   [] Difficult urination  [] Frequent urination  [] Burning with urination   [] Hematuria Skin:  [] Rashes   [] Ulcers   [] Wounds Psychological:  [x] History of anxiety   [x]  History of major depression.  Physical Examination  BP 135/83 (BP Location: Right Arm)   Pulse 69   Resp 18   Ht 5\' 3"  (1.6 m)   Wt 205 lb 12.8 oz (93.4 kg)   LMP 08/31/2018   BMI 36.46 kg/m  Gen:  WD/WN, NAD Head: [place]/AT, No temporalis wasting. Ear/Nose/Throat: Hearing grossly intact, nares w/o erythema or drainage Eyes: Conjunctiva clear. Sclera non-icteric Neck: Supple.  Trachea midline Pulmonary:  Good air movement, no use of accessory muscles.  Cardiac: RRR, no JVD Vascular: excellent thrill in left brachiocephalic AVF Vessel Right Left  Radial Palpable Palpable            Musculoskeletal: M/S 5/5 throughout.  No deformity or atrophy. No edema. Neurologic: Sensation grossly intact in extremities.  Symmetrical.  Speech is fluent.  Psychiatric: Judgment intact, Mood & affect appropriate for pt's clinical situation. Dermatologic: No rashes or ulcers noted.  No cellulitis or open wounds.      Labs No results found for this or any previous visit (from the past 2160 hour(s)).  Radiology No results found.  Assessment/Plan  ESRD on dialysis [facility]) Duplex today shows a widely patent left brachiocephalic AV fistula.  Trying to get on the transplant list and we discussed that the fistula would just stay in place if she does get a transplant.  I will plan on rechecking this in 1 year.  Essential hypertension BP better on dialysis now.    Festus Barren, MD  12/08/2022 11:35 AM    This note was created with Dragon medical transcription system.  Any errors from dictation are purely unintentional

## 2022-12-08 NOTE — Assessment & Plan Note (Signed)
Duplex today shows a widely patent left brachiocephalic AV fistula.  Trying to get on the transplant list and we discussed that the fistula would just stay in place if she does get a transplant.  I will plan on rechecking this in 1 year.

## 2022-12-22 ENCOUNTER — Ambulatory Visit (INDEPENDENT_AMBULATORY_CARE_PROVIDER_SITE_OTHER): Payer: Medicare Other | Admitting: Nurse Practitioner

## 2022-12-22 ENCOUNTER — Encounter: Payer: Self-pay | Admitting: Nurse Practitioner

## 2022-12-22 ENCOUNTER — Telehealth: Payer: Self-pay

## 2022-12-22 VITALS — BP 120/76 | HR 105 | Temp 97.6°F | Ht 63.0 in | Wt 203.4 lb

## 2022-12-22 DIAGNOSIS — Z992 Dependence on renal dialysis: Secondary | ICD-10-CM | POA: Diagnosis not present

## 2022-12-22 DIAGNOSIS — N186 End stage renal disease: Secondary | ICD-10-CM | POA: Diagnosis not present

## 2022-12-22 DIAGNOSIS — I10 Essential (primary) hypertension: Secondary | ICD-10-CM

## 2022-12-22 DIAGNOSIS — F419 Anxiety disorder, unspecified: Secondary | ICD-10-CM | POA: Diagnosis not present

## 2022-12-22 MED ORDER — HYDROXYZINE PAMOATE 25 MG PO CAPS: 25.0000 mg | ORAL_CAPSULE | Freq: Three times a day (TID) | ORAL | 0 refills | Status: DC | PRN

## 2022-12-22 NOTE — Telephone Encounter (Signed)
Patient states at check-out that Kara Dies, NP, asked her to fill out a Request & Authorization for Use/Disclosure of Protected Health Information form so we could send results to [facility].  I gave patient the form and she completed it.  Form is in [place], NP's color folder up front.

## 2022-12-22 NOTE — Progress Notes (Signed)
Established Patient Office Visit  Subjective:  Patient ID: Jennifer Newman, female    DOB: 1966-03-14  Age: 57 y.o. MRN: [id number]  CC:  Chief Complaint  Patient presents with   Medical Management of Chronic Issues    4 month follow up    HPI  Jennifer Newman presents for routine follow-up.  Patient want the results of mammogram, pap and cologuard sent to the [facility]. She is on the list for kidney transplant.   She is going on a trip and would like to have somethinhg to control the anxiety.   She is overall doing well no new complaints at present. HPI   Past Medical History:  Diagnosis Date   Anxiety    Bipolar disorder (HCC)    Chronic kidney disease    Depression    Dysrhythmia    tachycardia   GERD (gastroesophageal reflux disease)    Hypertension    Obsessive-compulsive disorder    PTSD (post-traumatic stress disorder)    Tachycardia, unspecified    Vertigo     Past Surgical History:  Procedure Laterality Date   A/V FISTULAGRAM Left 06/30/2019   Procedure: A/V FISTULAGRAM;  Surgeon: Annice Needy, MD;  Location: [facility];  Service: Cardiovascular;  Laterality: Left;   AV FISTULA PLACEMENT Left 02/26/2019   Procedure: ARTERIOVENOUS (AV) FISTULA CREATION ( RADIAL CEPHALIC );  Surgeon: Annice Needy, MD;  Location: [facility];  Service: Vascular;  Laterality: Left;   AV FISTULA PLACEMENT Left 04/16/2019   Procedure: ARTERIOVENOUS (AV) FISTULA CREATION ( BRACHIAL CEPHALIC );  Surgeon: Annice Needy, MD;  Location: [facility];  Service: Vascular;  Laterality: Left;   BREAST BIOPSY Right 12/07/2021   Stereo Bx ribbon clip- path pending   BREAST BIOPSY Left 12/07/2021   [place] Core Bx 1200 5cmfn ribbon clip- path pending   BREAST BIOPSY Left 12/07/2021   [place] Core BX 300 Coil clip - Path pending   DIALYSIS/PERMA CATHETER INSERTION N/A 08/22/2018   Procedure: DIALYSIS temporary catheter INSERTION;  Surgeon: Annice Needy, MD;  Location: [facility];  Service:  Cardiovascular;  Laterality: N/A;   DIALYSIS/PERMA CATHETER INSERTION N/A 08/26/2018   Procedure: DIALYSIS/PERMA CATHETER INSERTION;  Surgeon: Annice Needy, MD;  Location: [facility];  Service: Cardiovascular;  Laterality: N/A;   DIALYSIS/PERMA CATHETER INSERTION N/A 01/01/2019   Procedure: DIALYSIS/PERMA CATHETER INSERTION;  Surgeon: Annice Needy, MD;  Location: [facility];  Service: Cardiovascular;  Laterality: N/A;   DIALYSIS/PERMA CATHETER REMOVAL N/A 12/29/2019   Procedure: DIALYSIS/PERMA CATHETER REMOVAL;  Surgeon: Annice Needy, MD;  Location: [facility];  Service: Cardiovascular;  Laterality: N/A;   FISTULA SUPERFICIALIZATION Left 10/29/2019   Procedure: FISTULA SUPERFICIALIZATION;  Surgeon: Annice Needy, MD;  Location: [facility];  Service: Vascular;  Laterality: Left;   none     PERIPHERAL VASCULAR THROMBECTOMY Left 03/12/2019   Procedure: PERIPHERAL VASCULAR THROMBECTOMY;  Surgeon: Annice Needy, MD;  Location: [facility];  Service: Cardiovascular;  Laterality: Left;   PERIPHERAL VASCULAR THROMBECTOMY Left 03/24/2020   Procedure: PERIPHERAL VASCULAR THROMBECTOMY;  Surgeon: Annice Needy, MD;  Location: [facility];  Service: Cardiovascular;  Laterality: Left;    Family History  Problem Relation Age of Onset   Breast cancer Maternal Grandmother    COPD Neg Hx    Diabetes Neg Hx    Hypertension Neg Hx    CAD Neg Hx  Social History   Socioeconomic History   Marital status: Married    Spouse name: Ambulance person   Number of children: 2   Years of education: Not on file   Highest education level: Associate degree: occupational, Scientist, product/process development, or vocational program  Occupational History   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    Comment: not employed  Tobacco Use   Smoking status: Never   Smokeless tobacco: Never  Vaping Use   Vaping Use: Never used  Substance and Sexual Activity   Alcohol use: Not Currently     Alcohol/week: 0.0 standard drinks of alcohol   Drug use: Not Currently    Types: Marijuana    Comment: > 70yrs   Sexual activity: Not Currently    Birth control/protection: Post-menopausal  Other Topics Concern   Not on file  Social History Narrative   Not on file   Social Determinants of Health   Financial Resource Strain: Low Risk  (10/12/2022)   Overall Financial Resource Strain (CARDIA)    Difficulty of Paying Living Expenses: Not hard at all  Food Insecurity: No Food Insecurity (10/12/2022)   Hunger Vital Sign    Worried About Running Out of Food in the Last Year: Never true    Ran Out of Food in the Last Year: Never true  Transportation Needs: No Transportation Needs (10/12/2022)   PRAPARE - Administrator, Civil Service (Medical): No    Lack of Transportation (Non-Medical): No  Physical Activity: Inactive (10/12/2022)   Exercise Vital Sign    Days of Exercise per Week: 0 days    Minutes of Exercise per Session: 0 min  Stress: No Stress Concern Present (10/12/2022)   [employer] of Occupational Health - Occupational Stress Questionnaire    Feeling of Stress : Not at all  Social Connections: Socially Integrated (10/12/2022)   Social Connection and Isolation Panel [NHANES]    Frequency of Communication with Friends and Family: Twice a week    Frequency of Social Gatherings with Friends and Family: Once a week    Attends Religious Services: More than 4 times per year    Active Member of [employer] or Organizations: Yes    Attends Banker Meetings: More than 4 times per year    Marital Status: Married  Catering manager Violence: Not At Risk (10/12/2022)   Humiliation, Afraid, Rape, and Kick questionnaire    Fear of Current or Ex-Partner: No    Emotionally Abused: No    Physically Abused: No    Sexually Abused: No     No outpatient medications prior to visit.   No facility-administered medications prior to visit.    Allergies  Allergen Reactions    Cat Hair Extract Hives    ROS Review of Systems Negative unless indicated in HPI.    Objective:    Physical Exam Constitutional:      Appearance: Normal appearance.  HENT:     Mouth/Throat:     Mouth: Mucous membranes are moist.  Eyes:     Conjunctiva/sclera: Conjunctivae normal.     Pupils: Pupils are equal, round, and reactive to light.  Cardiovascular:     Rate and Rhythm: Normal rate. Rhythm irregular.     Pulses: Normal pulses.     Heart sounds: Normal heart sounds.     Comments: Heart murmur Pulmonary:     Effort: Pulmonary effort is normal.     Breath sounds: Normal breath sounds.  Abdominal:     General: Bowel  sounds are normal.     Palpations: Abdomen is soft.  Musculoskeletal:     Cervical back: Normal range of motion. No tenderness.  Skin:    General: Skin is warm.     Findings: No bruising.  Neurological:     General: No focal deficit present.     Mental Status: She is alert and oriented to person, place, and time. Mental status is at baseline.  Psychiatric:        Mood and Affect: Mood normal.        Behavior: Behavior normal.        Thought Content: Thought content normal.        Judgment: Judgment normal.     BP 120/76   Pulse (!) 105   Temp 97.6 F (36.4 C)   Ht 5\' 3"  (1.6 m)   Wt 203 lb 6.4 oz (92.3 kg)   LMP 08/31/2018   SpO2 97%   BMI 36.03 kg/m  Wt Readings from Last 3 Encounters:  12/22/22 203 lb 6.4 oz (92.3 kg)  12/08/22 205 lb 12.8 oz (93.4 kg)  09/02/22 190 lb (86.2 kg)     Health Maintenance  Topic Date Due   Colonoscopy  Never done   DTaP/Tdap/Td (2 - Td or Tdap) 06/01/2021   Zoster Vaccines- Shingrix (1 of 2) 01/11/2023 (Originally 11/15/2017)   INFLUENZA VACCINE  02/01/2023   Medicare Annual Wellness (AWV)  10/12/2023   MAMMOGRAM  09/28/2024   PAP SMEAR-Modifier  08/18/2025   Hepatitis C Screening  Completed   HIV Screening  Completed   HPV VACCINES  Aged Out   COVID-19 Vaccine  Discontinued    There are no  preventive care reminders to display for this patient.  Lab Results  Component Value Date   TSH 0.74 08/22/2021   Lab Results  Component Value Date   WBC 8.7 08/22/2021   HGB 12.5 08/22/2021   HCT 38.6 08/22/2021   MCV 87.7 08/22/2021   PLT 315 08/22/2021   Lab Results  Component Value Date   NA 138 08/22/2021   K 3.7 08/22/2021   CO2 25 08/22/2021   GLUCOSE 100 (H) 08/22/2021   BUN 53 (H) 08/22/2021   CREATININE 3.48 (H) 08/22/2021   BILITOT 0.3 08/22/2021   ALKPHOS 197 (H) 10/06/2020   AST 28 08/22/2021   ALT 27 08/22/2021   PROT 7.4 08/22/2021   ALBUMIN 3.3 (L) 10/07/2020   CALCIUM 9.5 08/22/2021   ANIONGAP 10 10/08/2020   EGFR 15 (L) 08/22/2021   Lab Results  Component Value Date   CHOL 291 (H) 08/22/2021   Lab Results  Component Value Date   HDL 45 (L) 08/22/2021   Lab Results  Component Value Date   LDLCALC 199 (H) 08/22/2021   Lab Results  Component Value Date   TRIG 263 (H) 08/22/2021   Lab Results  Component Value Date   CHOLHDL 6.5 (H) 08/22/2021   No results found for: "HGBA1C"    Assessment & Plan:  Anxiety Assessment & Plan: Will treat with hydroxyzine 25 mg as needed.  Orders: -     hydrOXYzine Pamoate; Take 1 capsule (25 mg total) by mouth every 8 (eight) hours as needed.  Dispense: 30 capsule; Refill: 0  Essential hypertension Assessment & Plan: BP better on dialysis now.   ESRD on dialysis [facility]) Assessment & Plan: Tolerating well.     Follow-up: Return in about 6 months (around 06/23/2023) for chronic management.   Kara Dies, NP

## 2022-12-22 NOTE — Patient Instructions (Addendum)
You had mammogram on 09/29/22 at [facility]. 08/18/22:  Pap and 08/29/22: Cologuard Please sign the release form to send results to [facility]. Please ask the nephrologist to add lipid panel to the blood work. Rx sent to pharmacy.

## 2022-12-25 NOTE — Telephone Encounter (Signed)
noted 

## 2022-12-28 ENCOUNTER — Telehealth: Payer: Self-pay

## 2022-12-28 NOTE — Telephone Encounter (Signed)
Faxed Mammogram, Ultrasound, Cologard and Pap smear to [facility]- Attn: Harley Hallmark at [phone].

## 2023-01-05 NOTE — Assessment & Plan Note (Signed)
BP better on dialysis now. 

## 2023-01-05 NOTE — Assessment & Plan Note (Signed)
Will treat with hydroxyzine 25 mg as needed.

## 2023-01-05 NOTE — Assessment & Plan Note (Signed)
Tolerating well

## 2023-02-22 IMAGING — US US BREAST*L* LIMITED INC AXILLA
2 series · 12 of 19 positions shown · non-contrast
Comparison: Comparison was made with the HerScan study from
08/30/2021 and her prior mammogram 01/30/2008.

CLINICAL DATA: 55-year-old female presenting for evaluation of 3
areas in the left breast following a HerScan ultrasound performed at
an outside facility. The study describes a 1.5 cm masslike structure
at 12 o'clock in the left breast, a 0.6 cm structure in the left
breast at 3 o'clock, 4 cm from the nipple and a 0.3 cm structure in
the left breast at 3 o'clock, 4 cm from the nipple. Her last
mammogram was performed 01/30/2008.

EXAM:
DIGITAL DIAGNOSTIC BILATERAL MAMMOGRAM WITH TOMOSYNTHESIS AND CAD;
ULTRASOUND LEFT BREAST LIMITED
TECHNIQUE: Bilateral digital diagnostic mammography and breast tomosynthesis
was performed. The images were evaluated with computer-aided
detection.; Targeted ultrasound examination of the left breast was
performed.

[Series 1: us breast*left* limited inc axilla · 0.04mm/px · 8 of 13 slices shown (1 of 2)]
[im 1/13]
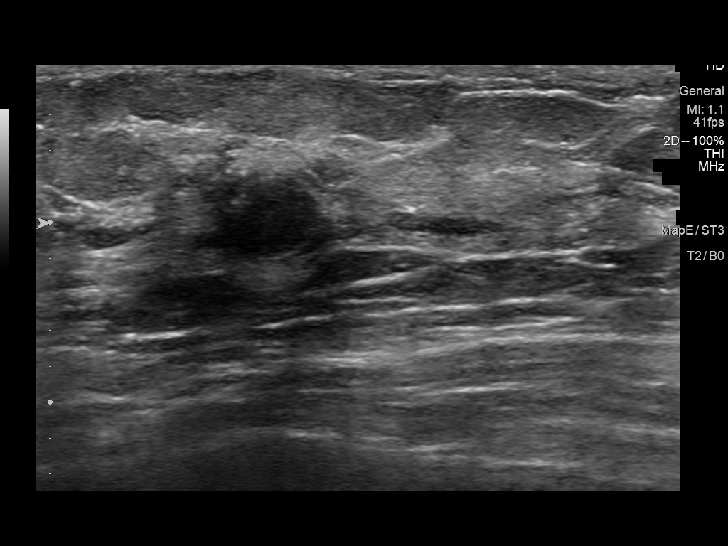
[im 3/13]
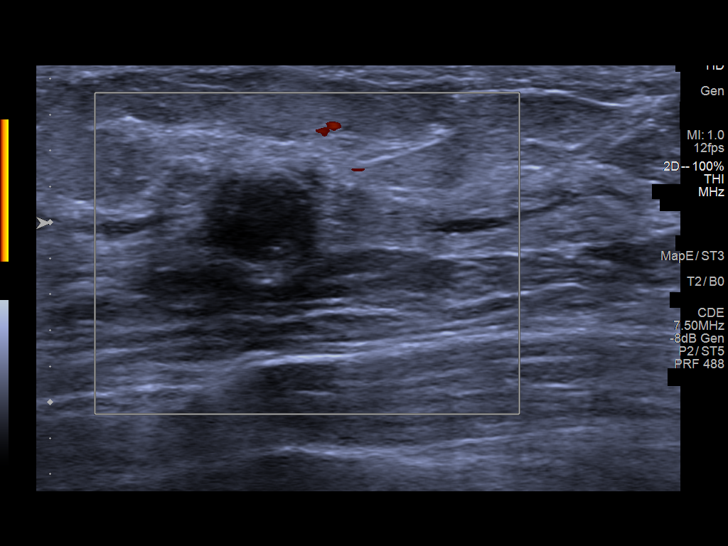
[im 4/13]
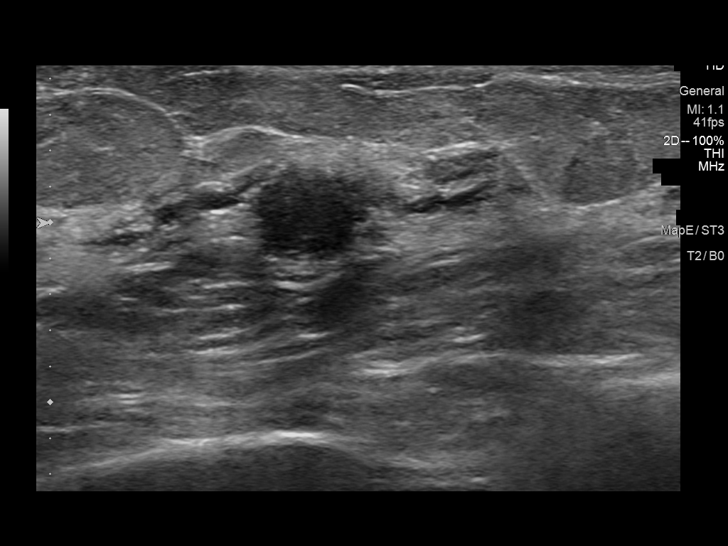
[im 6/13]
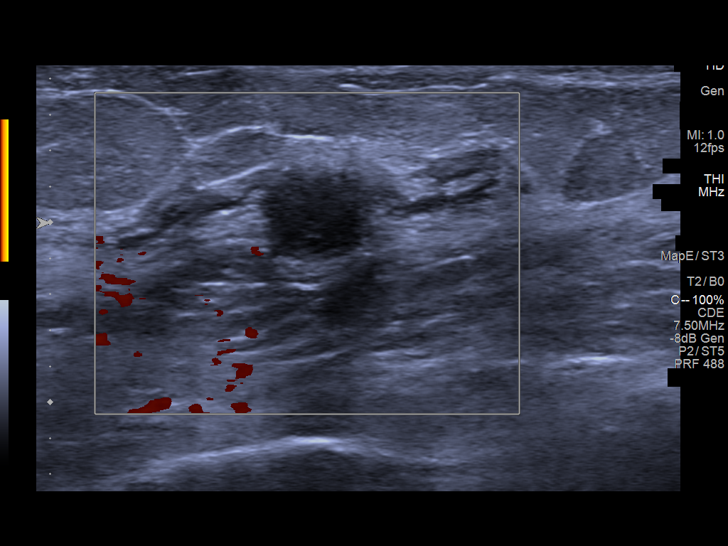
[im 8/13]
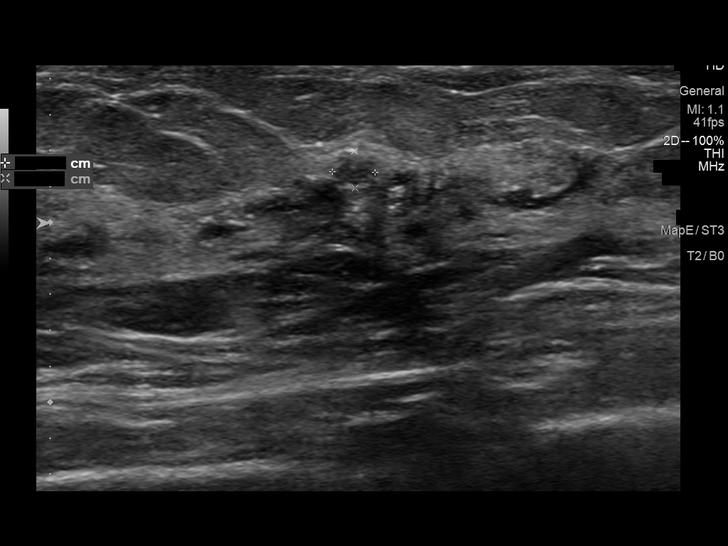
[im 9/13]
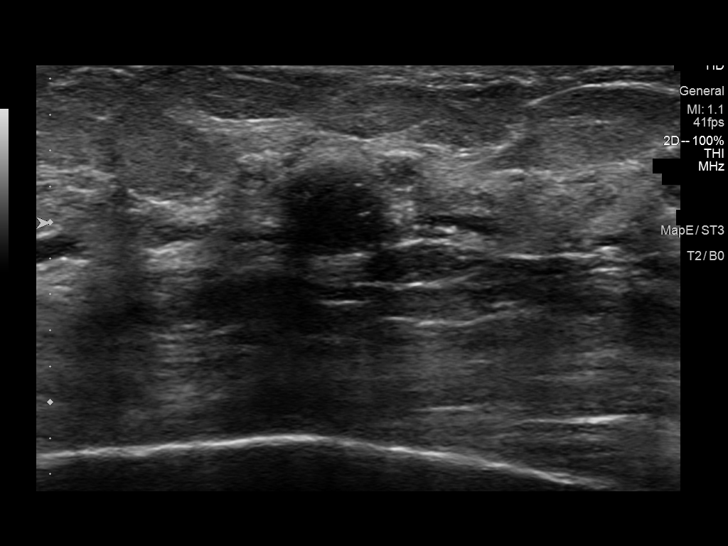
[im 11/13]
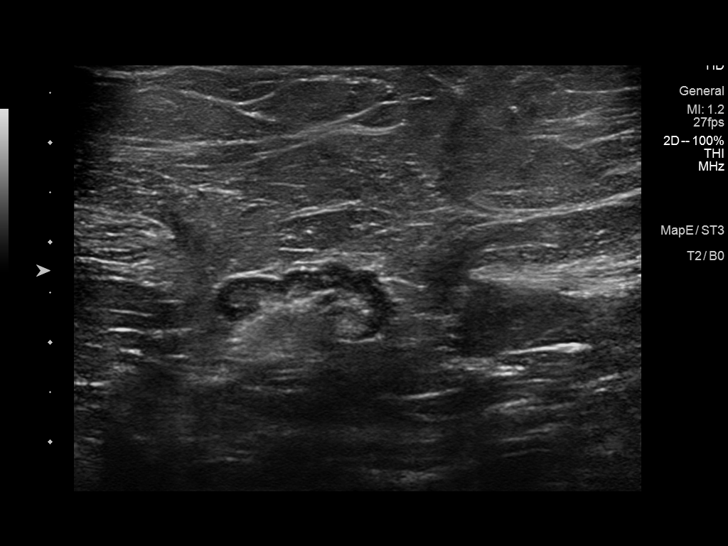
[im 12/13]
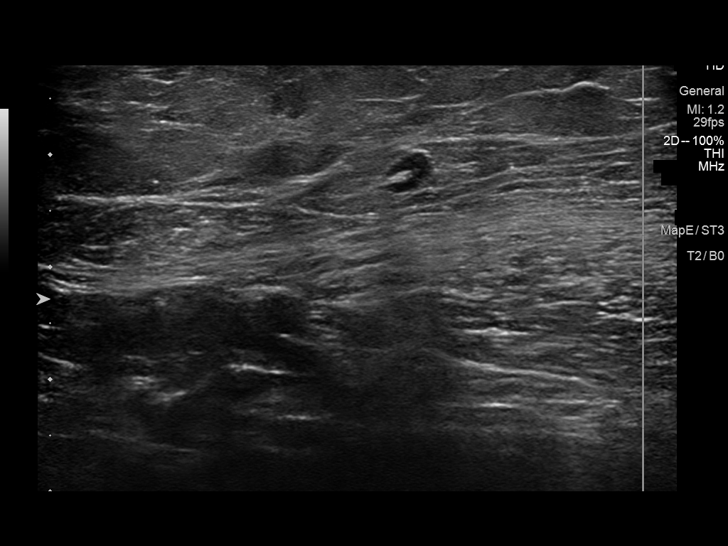

[Series 2: us breast*left* limited inc axilla · 0.04mm/px · 4 of 6 slices shown (2 of 2)]
[im 1/6]
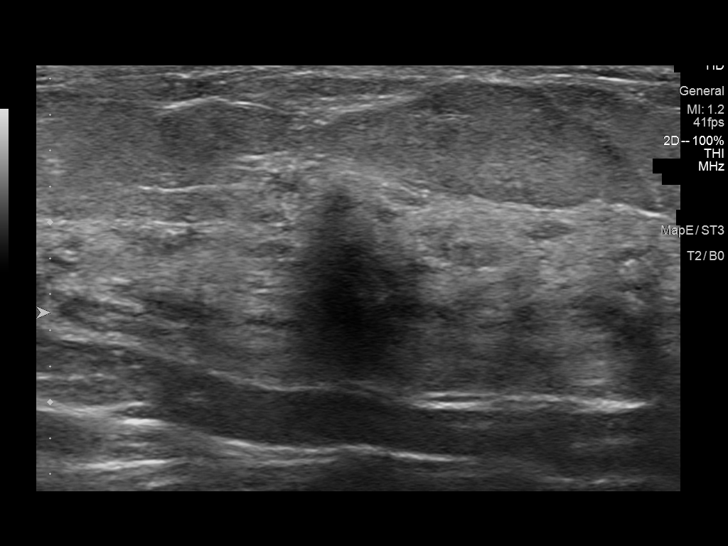
[im 3/6]
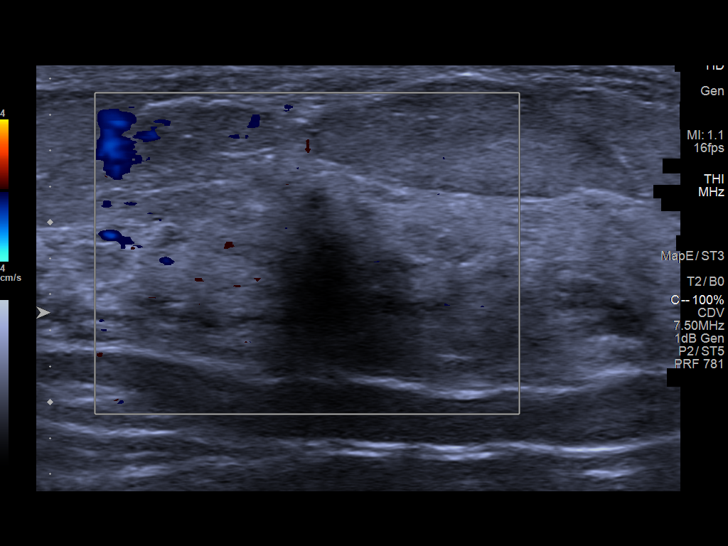
[im 4/6]
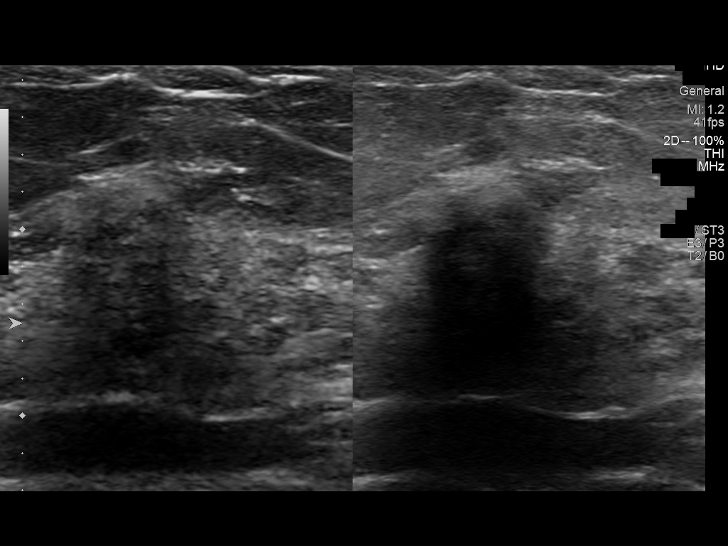
[im 6/6]
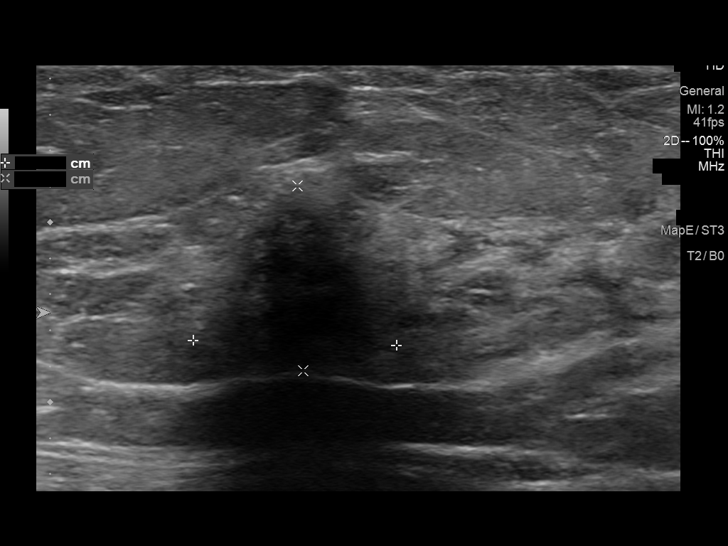

[12 of 19 positions shown; findings below may reference images not displayed]

ACR Breast Density Category c: The breast tissue is heterogeneously
dense, which may obscure small masses.
FINDINGS: Spot compression tomosynthesis images through the superior left
breast in the lateral aspect of the left breast demonstrate no
definite suspicious masses or areas of distortion. The patient does
have regional amorphous calcifications of the upper-outer quadrant
of the left breast spanning approximately 5 cm. There are
calcifications in the upper-outer quadrant of the right breast that
have a similar appearance spanning 1.8 cm. There is a second group
in the upper-outer retroareolar right breast which spans 0.9 cm,
which are more densely clustered.

Ultrasound targeted to the left breast at 12 o'clock, 5 cm from the
nipple demonstrates a hypoechoic area of shadowing measuring 1.1 x
1.0 x 0.7 cm.

Ultrasound of the left breast at 3 o'clock, 4 cm from the nipple
demonstrates 2 adjacent oval masses with indistinct margins together
measuring 0.8 cm. The larger mass alone measures 0.6 x 0.5 x 0.6 cm.

Ultrasound of the left axilla demonstrates normal-appearing lymph
nodes.
IMPRESSION: 1. There is a 1.1 cm indeterminate shadowing mass in the left breast
at 12 o'clock.

2. There are 2 abutting indeterminate masses in the left breast at 3
o'clock together measuring 0.8 cm.

3.  No evidence of left axillary lymphadenopathy.

4. Bilateral breast calcifications. There is one 0.9 cm group of
calcifications in the upper outer anterior right breast which has a
slightly different appearance than the remainder.

RECOMMENDATION:
1. Ultrasound-guided biopsy is recommended for the left breast
masses at 12 o'clock and at 3 o'clock.

2. Stereotactic biopsy is recommended for the 0.9 cm group of
calcifications in the upper outer anterior right breast. The patient
will be contacted by the breast navigator to schedule the biopsy at
her earliest convenience.

3. If the biopsies are benign, a six-month follow-up bilateral
diagnostic mammogram is recommended to monitor the remainder of the
calcifications. If the biopsies are malignant, additional
stereotactic biopsies will be recommended.

I have discussed the findings and recommendations with the patient.
If applicable, a reminder letter will be sent to the patient
regarding the next appointment.

BI-RADS CATEGORY  4: Suspicious.

## 2023-02-22 IMAGING — MG DIGITAL DIAGNOSTIC BILAT W/ TOMO W/ CAD
8 of 18 series · 8 of 40 positions shown · non-contrast
Comparison: Comparison was made with the HerScan study from
08/30/2021 and her prior mammogram 01/30/2008.

CLINICAL DATA: 55-year-old female presenting for evaluation of 3
areas in the left breast following a HerScan ultrasound performed at
an outside facility. The study describes a 1.5 cm masslike structure
at 12 o'clock in the left breast, a 0.6 cm structure in the left
breast at 3 o'clock, 4 cm from the nipple and a 0.3 cm structure in
the left breast at 3 o'clock, 4 cm from the nipple. Her last
mammogram was performed 01/30/2008.

EXAM:
DIGITAL DIAGNOSTIC BILATERAL MAMMOGRAM WITH TOMOSYNTHESIS AND CAD;
ULTRASOUND LEFT BREAST LIMITED
TECHNIQUE: Bilateral digital diagnostic mammography and breast tomosynthesis
was performed. The images were evaluated with computer-aided
detection.; Targeted ultrasound examination of the left breast was
performed.

[L CC (1 of 2)]
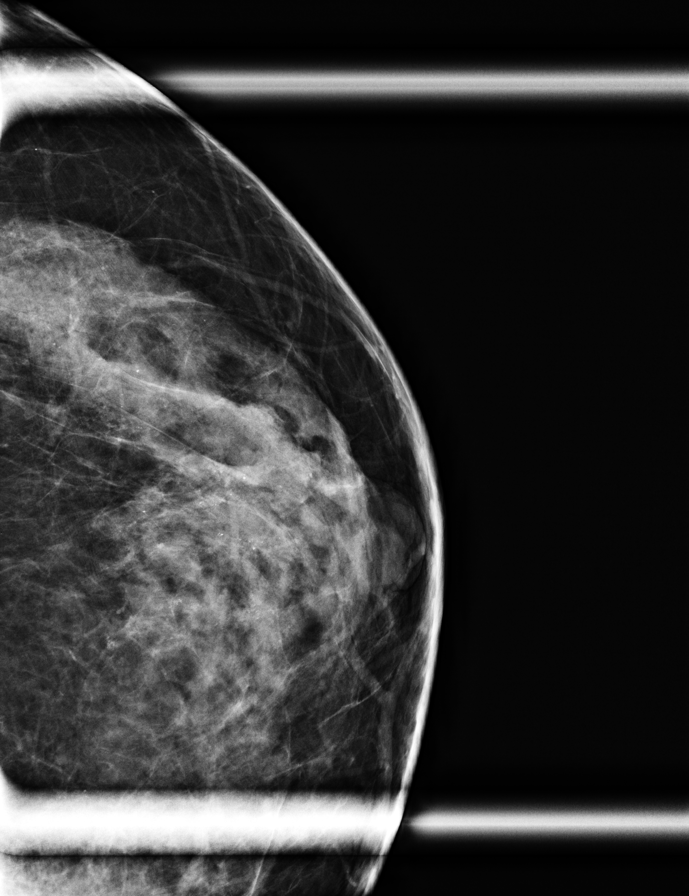

[L CC (2 of 2)]
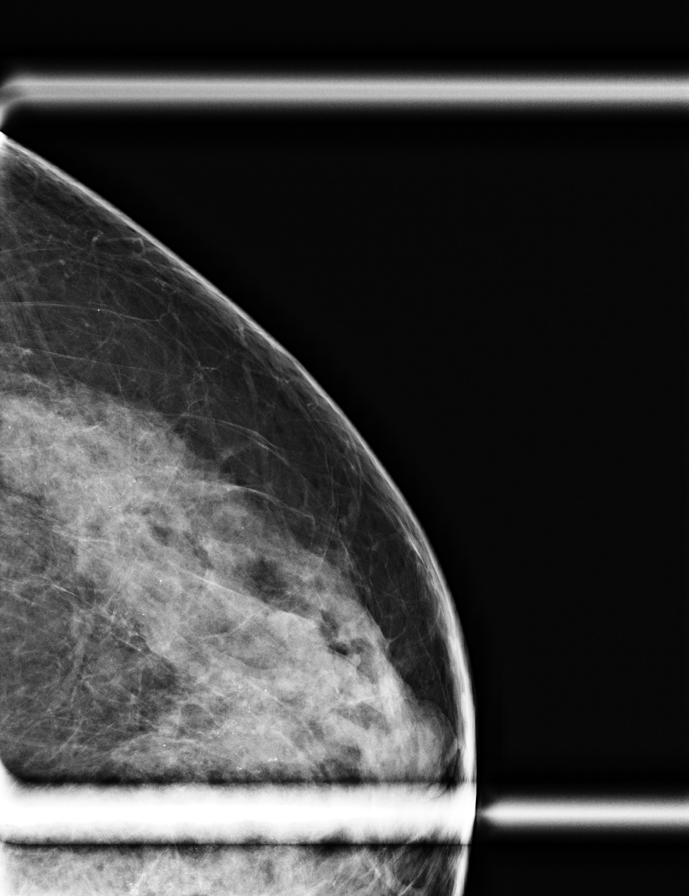

[R ML]
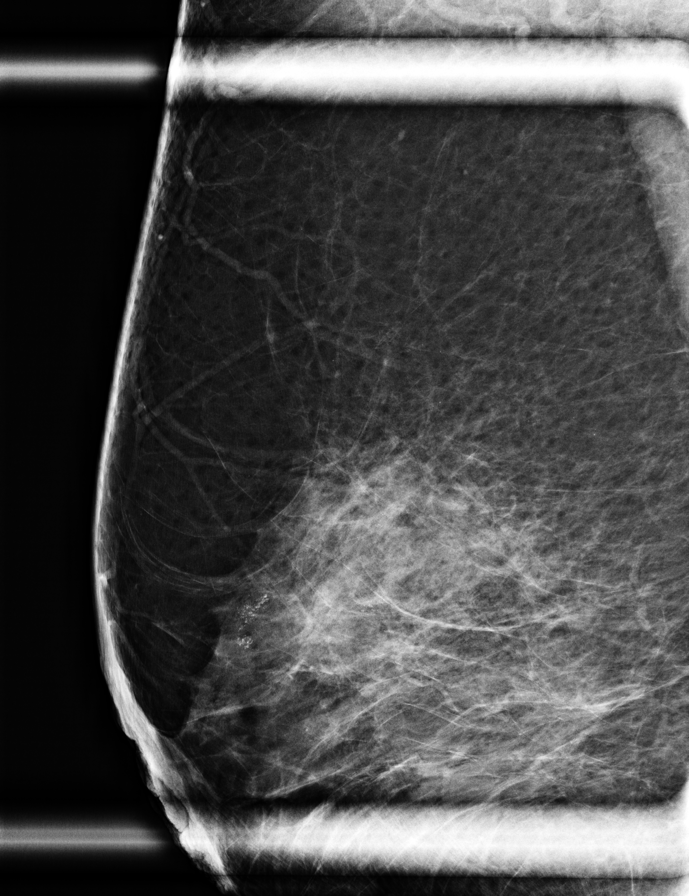

[L ML]
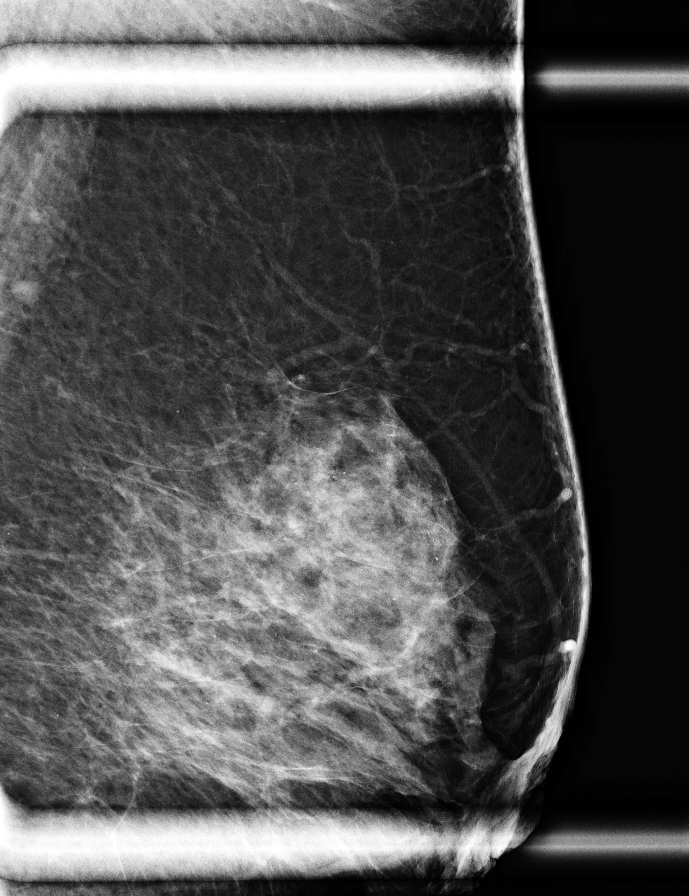

[L CC synth-2D]
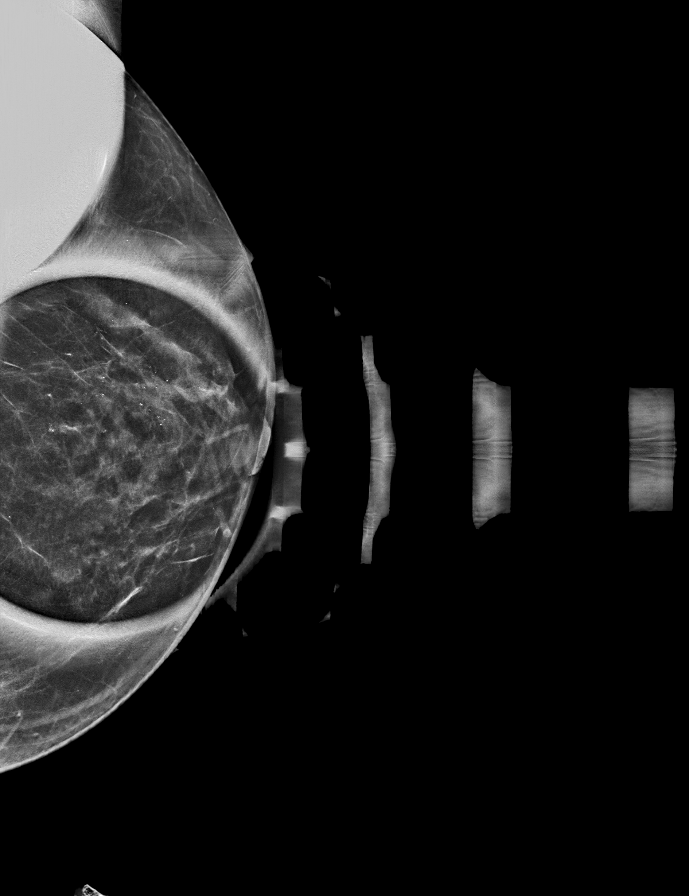

[L MLO synth-2D]
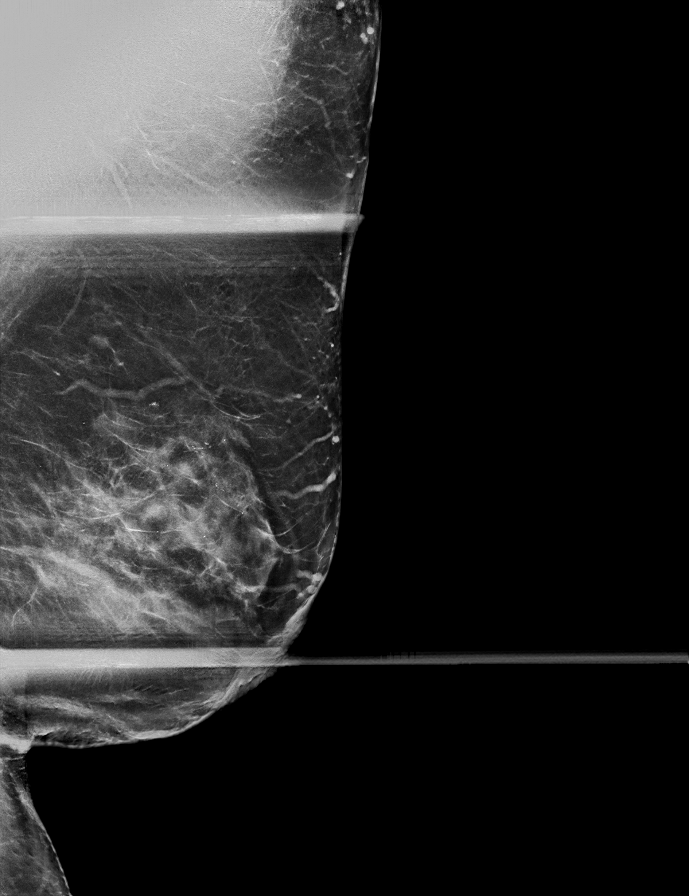

[L ML synth-2D]
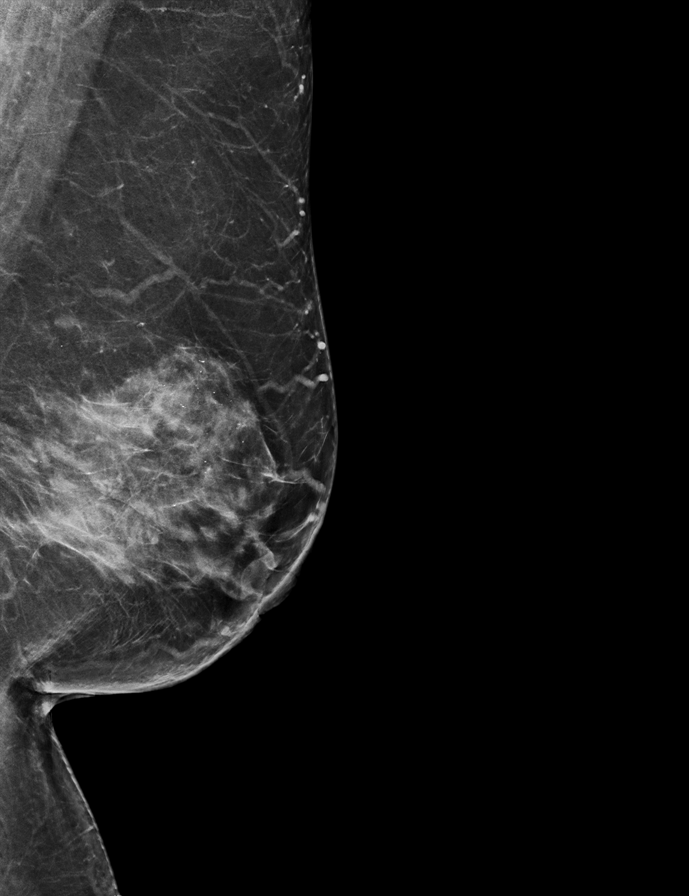

[R MLO synth-2D]
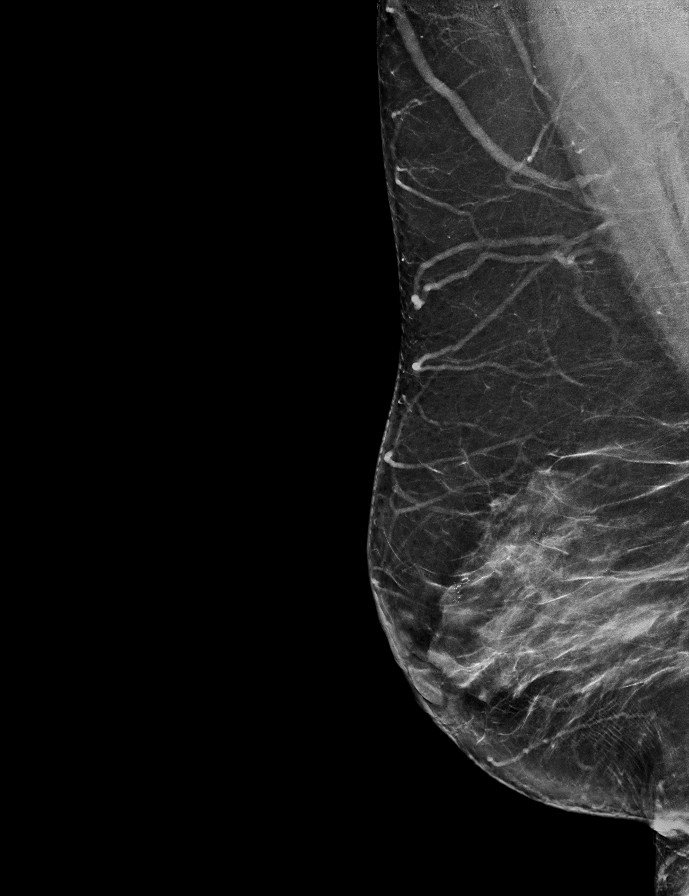

[8 of 40 positions shown; findings below may reference images not displayed]

ACR Breast Density Category c: The breast tissue is heterogeneously
dense, which may obscure small masses.
FINDINGS: Spot compression tomosynthesis images through the superior left
breast in the lateral aspect of the left breast demonstrate no
definite suspicious masses or areas of distortion. The patient does
have regional amorphous calcifications of the upper-outer quadrant
of the left breast spanning approximately 5 cm. There are
calcifications in the upper-outer quadrant of the right breast that
have a similar appearance spanning 1.8 cm. There is a second group
in the upper-outer retroareolar right breast which spans 0.9 cm,
which are more densely clustered.

Ultrasound targeted to the left breast at 12 o'clock, 5 cm from the
nipple demonstrates a hypoechoic area of shadowing measuring 1.1 x
1.0 x 0.7 cm.

Ultrasound of the left breast at 3 o'clock, 4 cm from the nipple
demonstrates 2 adjacent oval masses with indistinct margins together
measuring 0.8 cm. The larger mass alone measures 0.6 x 0.5 x 0.6 cm.

Ultrasound of the left axilla demonstrates normal-appearing lymph
nodes.
IMPRESSION: 1. There is a 1.1 cm indeterminate shadowing mass in the left breast
at 12 o'clock.

2. There are 2 abutting indeterminate masses in the left breast at 3
o'clock together measuring 0.8 cm.

3.  No evidence of left axillary lymphadenopathy.

4. Bilateral breast calcifications. There is one 0.9 cm group of
calcifications in the upper outer anterior right breast which has a
slightly different appearance than the remainder.

RECOMMENDATION:
1. Ultrasound-guided biopsy is recommended for the left breast
masses at 12 o'clock and at 3 o'clock.

2. Stereotactic biopsy is recommended for the 0.9 cm group of
calcifications in the upper outer anterior right breast. The patient
will be contacted by the breast navigator to schedule the biopsy at
her earliest convenience.

3. If the biopsies are benign, a six-month follow-up bilateral
diagnostic mammogram is recommended to monitor the remainder of the
calcifications. If the biopsies are malignant, additional
stereotactic biopsies will be recommended.

I have discussed the findings and recommendations with the patient.
If applicable, a reminder letter will be sent to the patient
regarding the next appointment.

BI-RADS CATEGORY  4: Suspicious.

## 2023-02-23 ENCOUNTER — Ambulatory Visit: Payer: Medicare Other | Admitting: Cardiology

## 2023-04-04 ENCOUNTER — Ambulatory Visit: Payer: Medicare Other | Admitting: Cardiology

## 2023-04-12 ENCOUNTER — Ambulatory Visit
Admission: RE | Admit: 2023-04-12 | Discharge: 2023-04-12 | Disposition: A | Discharge status: Home / Self Care | Payer: Medicare Other | Source: Ambulatory Visit | Attending: Nurse Practitioner | Admitting: Nurse Practitioner

## 2023-04-12 DIAGNOSIS — R921 Mammographic calcification found on diagnostic imaging of breast: Secondary | ICD-10-CM

## 2023-06-04 ENCOUNTER — Telehealth: Payer: Self-pay

## 2023-06-04 NOTE — Telephone Encounter (Signed)
Patient's husband came to check on handicap placard . Placard ran out in 11/24.

## 2023-06-05 NOTE — Telephone Encounter (Signed)
Patient's husband, Elodie Mcwilliam, is calling to see if he can come by and pick up the handicap placard for patient.  Barbara Cower states it would be nice if they can get a permanent handicap placard so they won't have to go through this process every six months.  Barbara Cower states he would like for [place] to please call him when it's ready for him to pick up.

## 2023-06-07 NOTE — Telephone Encounter (Signed)
Paperwork is up front for Patient pick up since 06/05/23 and I left a voicemail letting them know it is ready due to not answering the phone.

## 2023-06-07 NOTE — Telephone Encounter (Signed)
Paperwork is ready for pick.

## 2023-06-22 ENCOUNTER — Ambulatory Visit: Payer: Medicare Other | Admitting: Nurse Practitioner

## 2023-09-05 ENCOUNTER — Telehealth: Payer: Self-pay | Admitting: Nurse Practitioner

## 2023-09-05 NOTE — Telephone Encounter (Signed)
 Copied from CRM [id number]. Topic: Medicare AWV >> Sep 05, 2023  3:14 PM Payton Doughty wrote: Reason for CRM: Called LVM 09/05/2023 to schedule AWV. Please schedule office or virtual visits.  Verlee Rossetti; Care Guide Ambulatory Clinical Support [place] [facility] Direct Dial: [phone]

## 2023-10-17 ENCOUNTER — Ambulatory Visit (INDEPENDENT_AMBULATORY_CARE_PROVIDER_SITE_OTHER): Admitting: *Deleted

## 2023-10-17 VITALS — Ht 63.0 in | Wt 195.0 lb

## 2023-10-17 DIAGNOSIS — Z0000 Encounter for general adult medical examination without abnormal findings: Secondary | ICD-10-CM | POA: Diagnosis not present

## 2023-10-17 NOTE — Patient Instructions (Signed)
 Jennifer Newman , Thank you for taking time to come for your Medicare Wellness Visit. I appreciate your ongoing commitment to your health goals. Please review the following plan we discussed and let me know if I can assist you in the future.   Referrals/Orders/Follow-Ups/Clinician Recommendations: Consider updating your vaccines  This is a list of the screening recommended for you and due dates:  Health Maintenance  Topic Date Due   Zoster (Shingles) Vaccine (1 of 2) Never done   Pneumococcal Vaccination (2 of 2 - PPSV23) 11/19/2019   DTaP/Tdap/Td vaccine (2 - Td or Tdap) 06/01/2021   Flu Shot  02/01/2024   Medicare Annual Wellness Visit  10/16/2024   Mammogram  04/11/2025   Pap with HPV screening  08/18/2025   Cologuard (Stool DNA test)  08/29/2025   Hepatitis C Screening  Completed   HIV Screening  Completed   HPV Vaccine  Aged Out   Meningitis B Vaccine  Aged Out   COVID-19 Vaccine  Discontinued    Advanced directives: (Declined) Advance directive discussed with you today. Even though you declined this today, please call our office should you change your mind, and we can give you the proper paperwork for you to fill out.  Next Medicare Annual Wellness Visit scheduled for next year: Yes 10/21/24 @ 11:30

## 2023-10-17 NOTE — Progress Notes (Signed)
 Subjective:   Jennifer Newman is a 58 y.o. who presents for a Medicare Wellness preventive visit.  Visit Complete: Virtual I connected with  Jennifer Newman on 10/17/23 by a audio enabled telemedicine application and verified that I am speaking with the correct person using two identifiers.  Patient Location: Home  Provider Location: Home Office  I discussed the limitations of evaluation and management by telemedicine. The patient expressed understanding and agreed to proceed.  Vital Signs: Because this visit was a virtual/telehealth visit, some criteria may be missing or patient reported. Any vitals not documented were not able to be obtained and vitals that have been documented are patient reported.  VideoDeclined- This patient declined Librarian, academic. Therefore the visit was completed with audio only.  Persons Participating in Visit: Patient.  AWV Questionnaire: No: Patient Medicare AWV questionnaire was not completed prior to this visit.  Cardiac Risk Factors include: dyslipidemia;obesity (BMI >30kg/m2);hypertension     Objective:    Today's Vitals   10/17/23 1353  Weight: 195 lb (88.5 kg)  Height: 5\' 3"  (1.6 m)   Body mass index is 34.54 kg/m.     10/17/2023    2:03 PM 10/12/2022    3:13 PM 10/12/2022    3:11 PM 09/02/2022   12:52 PM 10/07/2020   12:33 AM 10/06/2020    1:21 PM 03/24/2020   11:10 AM  Advanced Directives  Does Patient Have a Medical Advance Directive? No No No No  No No  Would patient like information on creating a medical advance directive? No - Patient declined No - Patient declined No - Patient declined  No - Patient declined  No - Patient declined    Current Medications (verified) Outpatient Encounter Medications as of 10/17/2023  Medication Sig   hydrOXYzine (VISTARIL) 25 MG capsule Take 1 capsule (25 mg total) by mouth every 8 (eight) hours as needed.   No facility-administered encounter medications on file as of  10/17/2023.    Allergies (verified) Cat dander   History: Past Medical History:  Diagnosis Date   Anxiety    Bipolar disorder (HCC)    Chronic kidney disease    Depression    Dysrhythmia    tachycardia   GERD (gastroesophageal reflux disease)    Hypertension    Obsessive-compulsive disorder    PTSD (post-traumatic stress disorder)    Tachycardia, unspecified    Vertigo    Past Surgical History:  Procedure Laterality Date   A/V FISTULAGRAM Left 06/30/2019   Procedure: A/V FISTULAGRAM;  Surgeon: Annice Needy, MD;  Location: [facility];  Service: Cardiovascular;  Laterality: Left;   AV FISTULA PLACEMENT Left 02/26/2019   Procedure: ARTERIOVENOUS (AV) FISTULA CREATION ( RADIAL CEPHALIC );  Surgeon: Annice Needy, MD;  Location: [facility];  Service: Vascular;  Laterality: Left;   AV FISTULA PLACEMENT Left 04/16/2019   Procedure: ARTERIOVENOUS (AV) FISTULA CREATION ( BRACHIAL CEPHALIC );  Surgeon: Annice Needy, MD;  Location: [facility];  Service: Vascular;  Laterality: Left;   BREAST BIOPSY Right 12/07/2021   Stereo Bx ribbon clip- path pending   BREAST BIOPSY Left 12/07/2021   [place] Core Bx 1200 5cmfn ribbon clip- path pending   BREAST BIOPSY Left 12/07/2021   [place] Core BX 300 Coil clip - Path pending   DIALYSIS/PERMA CATHETER INSERTION N/A 08/22/2018   Procedure: DIALYSIS temporary catheter INSERTION;  Surgeon: Annice Needy, MD;  Location: [facility];  Service: Cardiovascular;  Laterality: N/A;  DIALYSIS/PERMA CATHETER INSERTION N/A 08/26/2018   Procedure: DIALYSIS/PERMA CATHETER INSERTION;  Surgeon: Annice Needy, MD;  Location: [facility];  Service: Cardiovascular;  Laterality: N/A;   DIALYSIS/PERMA CATHETER INSERTION N/A 01/01/2019   Procedure: DIALYSIS/PERMA CATHETER INSERTION;  Surgeon: Annice Needy, MD;  Location: [facility];  Service: Cardiovascular;  Laterality: N/A;   DIALYSIS/PERMA CATHETER REMOVAL N/A 12/29/2019   Procedure:  DIALYSIS/PERMA CATHETER REMOVAL;  Surgeon: Annice Needy, MD;  Location: [facility];  Service: Cardiovascular;  Laterality: N/A;   FISTULA SUPERFICIALIZATION Left 10/29/2019   Procedure: FISTULA SUPERFICIALIZATION;  Surgeon: Annice Needy, MD;  Location: [facility];  Service: Vascular;  Laterality: Left;   none     PERIPHERAL VASCULAR THROMBECTOMY Left 03/12/2019   Procedure: PERIPHERAL VASCULAR THROMBECTOMY;  Surgeon: Annice Needy, MD;  Location: [facility];  Service: Cardiovascular;  Laterality: Left;   PERIPHERAL VASCULAR THROMBECTOMY Left 03/24/2020   Procedure: PERIPHERAL VASCULAR THROMBECTOMY;  Surgeon: Annice Needy, MD;  Location: [facility];  Service: Cardiovascular;  Laterality: Left;   Family History  Problem Relation Age of Onset   Breast cancer Maternal Grandmother    COPD Neg Hx    Diabetes Neg Hx    Hypertension Neg Hx    CAD Neg Hx    Social History   Socioeconomic History   Marital status: Married    Spouse name: Ambulance person   Number of children: 2   Years of education: Not on file   Highest education level: Associate degree: occupational, Scientist, product/process development, or vocational program  Occupational History   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    Comment: not employed  Tobacco Use   Smoking status: Never   Smokeless tobacco: Never  Vaping Use   Vaping status: Never Used  Substance and Sexual Activity   Alcohol use: Not Currently    Alcohol/week: 0.0 standard drinks of alcohol   Drug use: Not Currently    Types: Marijuana    Comment: > 1yrs   Sexual activity: Not Currently    Birth control/protection: Post-menopausal  Other Topics Concern   Not on file  Social History Narrative   married   Social Drivers of Corporate investment banker Strain: Low Risk  (10/17/2023)   Overall Financial Resource Strain (CARDIA)    Difficulty of Paying Living Expenses: Not hard at all  Food Insecurity: No Food Insecurity (10/17/2023)   Hunger  Vital Sign    Worried About Running Out of Food in the Last Year: Never true    Ran Out of Food in the Last Year: Never true  Transportation Needs: No Transportation Needs (10/17/2023)   PRAPARE - Administrator, Civil Service (Medical): No    Lack of Transportation (Non-Medical): No  Physical Activity: Inactive (10/17/2023)   Exercise Vital Sign    Days of Exercise per Week: 0 days    Minutes of Exercise per Session: 0 min  Stress: No Stress Concern Present (10/17/2023)   [employer] of Occupational Health - Occupational Stress Questionnaire    Feeling of Stress : Not at all  Social Connections: Socially Integrated (10/17/2023)   Social Connection and Isolation Panel [NHANES]    Frequency of Communication with Friends and Family: Twice a week    Frequency of Social Gatherings with Friends and Family: Twice a week    Attends Religious Services: More than 4 times per year    Active Member of [employer] or Organizations:  Yes    Attends Banker Meetings: More than 4 times per year    Marital Status: Married    Tobacco Counseling Counseling given: Not Answered    Clinical Intake:  Pre-visit preparation completed: Yes  Pain : No/denies pain     BMI - recorded: 34.54 Nutritional Status: BMI > 30  Obese Nutritional Risks: None Diabetes: No  No results found for: "HGBA1C"   How often do you need to have someone help you when you read instructions, pamphlets, or other written materials from your doctor or pharmacy?: 1 - Never  Interpreter Needed?: No  Information entered by :: R. Tajuan Dufault LPN   Activities of Daily Living     10/17/2023    1:54 PM  In your present state of health, do you have any difficulty performing the following activities:  Hearing? 0  Vision? 0  Comment glasses  Difficulty concentrating or making decisions? 0  Walking or climbing stairs? 1  Dressing or bathing? 0  Doing errands, shopping? 0  Preparing Food and eating ? N   Using the Toilet? N  In the past six months, have you accidently leaked urine? N  Do you have problems with loss of bowel control? N  Managing your Medications? N  Managing your Finances? N  Housekeeping or managing your Housekeeping? N    Patient Care Team: Tona Francis, NP as PCP - General (Nurse Practitioner) Constancia Delton, MD as PCP - Cardiology (Cardiology)  Indicate any recent Medical Services you may have received from other than [facility] providers in the past year (date may be approximate).     Assessment:   This is a routine wellness examination for Angustura.  Hearing/Vision screen Hearing Screening - Comments:: No issues Vision Screening - Comments:: glasses   Goals Addressed             This Visit's Progress    Patient Stated       Wants to exercise more       Depression Screen     10/17/2023    1:59 PM 10/12/2022    3:18 PM 08/17/2022    2:22 PM 08/19/2021   10:11 AM  PHQ 2/9 Scores  PHQ - 2 Score 0 0 0 0  PHQ- 9 Score 0 1  2    Fall Risk     10/17/2023    1:56 PM 10/12/2022    3:10 PM 08/17/2022    2:21 PM 08/19/2021   10:09 AM  Fall Risk   Falls in the past year? 0 0 0 0  Number falls in past yr: 0 0 0 0  Injury with Fall? 0 0 0 0  Risk for fall due to : No Fall Risks  No Fall Risks No Fall Risks  Follow up Falls prevention discussed;Falls evaluation completed Falls evaluation completed;Education provided;Falls prevention discussed Falls evaluation completed Falls evaluation completed    MEDICARE RISK AT HOME:  Medicare Risk at Home Any stairs in or around the home?: Yes If so, are there any without handrails?: No Home free of loose throw rugs in walkways, pet beds, electrical cords, etc?: No (discussed replacing) Adequate lighting in your home to reduce risk of falls?: Yes Life alert?: No Use of a cane, walker or w/c?: No Grab bars in the bathroom?: No Shower chair or bench in shower?: No Elevated toilet seat or a handicapped  toilet?: No  TIMED UP AND GO:  Was the test performed?  No  Cognitive Function: 6CIT completed  10/17/2023    2:04 PM 10/12/2022    3:13 PM  6CIT Screen  What Year? 0 points 0 points  What month? 0 points 0 points  What time? 0 points 0 points  Count back from 20 0 points 0 points  Months in reverse 0 points 0 points  Repeat phrase 0 points 0 points  Total Score 0 points 0 points    Immunizations Immunization History  Administered Date(s) Administered   Hepatitis B, ADULT 10/12/2018, 11/14/2018, 12/05/2018, 03/06/2019   Hepb-cpg 10/12/2018, 11/14/2018, 12/05/2018, 03/06/2019, 01/08/2020, 02/05/2020, 03/06/2020, 05/06/2020   Influenza,inj,Quad PF,6+ Mos 08/08/2016   Influenza-Unspecified 08/08/2016, 02/03/2019   PPD Test 06/02/2011   Pneumococcal Conjugate-13 09/24/2019   Tdap 06/02/2011    Screening Tests Health Maintenance  Topic Date Due   Colonoscopy  Never done   Zoster Vaccines- Shingrix (1 of 2) Never done   Pneumococcal Vaccine 104-69 Years old (2 of 2 - PPSV23) 11/19/2019   DTaP/Tdap/Td (2 - Td or Tdap) 06/01/2021   Medicare Annual Wellness (AWV)  10/12/2023   INFLUENZA VACCINE  02/01/2024   MAMMOGRAM  04/11/2025   Cervical Cancer Screening (HPV/Pap Cotest)  08/18/2025   Hepatitis C Screening  Completed   HIV Screening  Completed   HPV VACCINES  Aged Out   Meningococcal B Vaccine  Aged Out   COVID-19 Vaccine  Discontinued    Health Maintenance  Health Maintenance Due  Topic Date Due   Colonoscopy  Never done   Zoster Vaccines- Shingrix (1 of 2) Never done   Pneumococcal Vaccine 54-18 Years old (2 of 2 - PPSV23) 11/19/2019   DTaP/Tdap/Td (2 - Td or Tdap) 06/01/2021   Medicare Annual Wellness (AWV)  10/12/2023   Health Maintenance Items Addressed: Patient declines vaccines  Additional Screening:  Vision Screening: Recommended annual ophthalmology exams for early detection of glaucoma and other disorders of the eye Up to date  [employer]  Dental Screening: Recommended annual dental exams for proper oral hygiene  Community Resource Referral / Chronic Care Management: CRR required this visit?  No   CCM required this visit?  No     Plan:     I have personally reviewed and noted the following in the patient's chart:   Medical and social history Use of alcohol, tobacco or illicit drugs  Current medications and supplements including opioid prescriptions. Patient is not currently taking opioid prescriptions. Functional ability and status Nutritional status Physical activity Advanced directives List of other physicians Hospitalizations, surgeries, and ER visits in previous 12 months Vitals Screenings to include cognitive, depression, and falls Referrals and appointments  In addition, I have reviewed and discussed with patient certain preventive protocols, quality metrics, and best practice recommendations. A written personalized care plan for preventive services as well as general preventive health recommendations were provided to patient.     Felicitas Horse, LPN   02/10/9146   After Visit Summary: (MyChart) Due to this being a telephonic visit, the after visit summary with patients personalized plan was offered to patient via MyChart   Notes: Nothing significant to report at this time.

## 2023-11-20 ENCOUNTER — Encounter (INDEPENDENT_AMBULATORY_CARE_PROVIDER_SITE_OTHER): Payer: Self-pay

## 2023-12-04 ENCOUNTER — Ambulatory Visit (INDEPENDENT_AMBULATORY_CARE_PROVIDER_SITE_OTHER): Admitting: Nurse Practitioner

## 2023-12-04 ENCOUNTER — Encounter (INDEPENDENT_AMBULATORY_CARE_PROVIDER_SITE_OTHER): Payer: Self-pay | Admitting: Nurse Practitioner

## 2023-12-04 ENCOUNTER — Ambulatory Visit (INDEPENDENT_AMBULATORY_CARE_PROVIDER_SITE_OTHER)

## 2023-12-04 VITALS — BP 150/102 | HR 76 | Resp 18 | Wt 199.4 lb

## 2023-12-04 DIAGNOSIS — N186 End stage renal disease: Secondary | ICD-10-CM

## 2023-12-04 DIAGNOSIS — Z992 Dependence on renal dialysis: Secondary | ICD-10-CM

## 2023-12-04 DIAGNOSIS — E785 Hyperlipidemia, unspecified: Secondary | ICD-10-CM | POA: Diagnosis not present

## 2023-12-04 DIAGNOSIS — I10 Essential (primary) hypertension: Secondary | ICD-10-CM

## 2023-12-04 NOTE — Progress Notes (Signed)
 Subjective:    Patient ID: Jennifer Newman, female    DOB: 31-Aug-1965, 58 y.o.   MRN: [id number] Chief Complaint  Patient presents with   Follow-up    61yr HDA follow up    The patient returns today for evaluation of her left upper extremity brachiocephalic AV fistula.  There was a referral sent on 09/07/2023 which noted there was concern for possible stenosis.  The patient notes that during that time she had a bad stick during dialysis which resulted in some swelling in her fistula as well as pain.  She notes that today the fistula is working well and she is not having any issues with dialysis.  She notes that she has been more selective with the staff that she last a sticker and this is been working much better.  Today she has a flow volume of 4352.  She denies any issues with run times or bleeding during dialysis treatments.  Today the patient has a small pseudoaneurysm at the mid upper arm measuring 0.76 cm.    Review of Systems  Hematological:  Does not bruise/bleed easily.  All other systems reviewed and are negative.      Objective:    Physical Exam Vitals reviewed.  HENT:     Head: Normocephalic.  Cardiovascular:     Rate and Rhythm: Normal rate.     Pulses: Normal pulses.          Radial pulses are 2+ on the left side.     Arteriovenous access: Left arteriovenous access is present.    Comments: Good thrill and bruit Pulmonary:     Effort: Pulmonary effort is normal.  Skin:    General: Skin is warm and dry.  Neurological:     Mental Status: She is alert and oriented to person, place, and time.  Psychiatric:        Mood and Affect: Mood normal.        Behavior: Behavior normal.        Thought Content: Thought content normal.        Judgment: Judgment normal.     BP (!) 150/102   Pulse 76   Resp 18   Wt 199 lb 6.4 oz (90.4 kg)   LMP 08/31/2018   BMI 35.32 kg/m   Past Medical History:  Diagnosis Date   Anxiety    Bipolar disorder (HCC)    Chronic kidney  disease    Depression    Dysrhythmia    tachycardia   GERD (gastroesophageal reflux disease)    Hypertension    Obsessive-compulsive disorder    PTSD (post-traumatic stress disorder)    Tachycardia, unspecified    Vertigo     Social History   Socioeconomic History   Marital status: Married    Spouse name: Ambulance person   Number of children: 2   Years of education: Not on file   Highest education level: Associate degree: occupational, Scientist, product/process development, or vocational program  Occupational History   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    Comment: not employed  Tobacco Use   Smoking status: Never   Smokeless tobacco: Never  Vaping Use   Vaping status: Never Used  Substance and Sexual Activity   Alcohol use: Not Currently    Alcohol/week: 0.0 standard drinks of alcohol   Drug use: Not Currently    Types: Marijuana    Comment: > 49yrs   Sexual activity: Not Currently    Birth control/protection: Post-menopausal  Other Topics Concern  Not on file  Social History Narrative   married   Social Drivers of Corporate investment banker Strain: Low Risk  (10/17/2023)   Overall Financial Resource Strain (CARDIA)    Difficulty of Paying Living Expenses: Not hard at all  Food Insecurity: No Food Insecurity (10/17/2023)   Hunger Vital Sign    Worried About Running Out of Food in the Last Year: Never true    Ran Out of Food in the Last Year: Never true  Transportation Needs: No Transportation Needs (10/17/2023)   PRAPARE - Administrator, Civil Service (Medical): No    Lack of Transportation (Non-Medical): No  Physical Activity: Inactive (10/17/2023)   Exercise Vital Sign    Days of Exercise per Week: 0 days    Minutes of Exercise per Session: 0 min  Stress: No Stress Concern Present (10/17/2023)   [employer] of Occupational Health - Occupational Stress Questionnaire    Feeling of Stress : Not at all  Social Connections: Socially Integrated (10/17/2023)    Social Connection and Isolation Panel [NHANES]    Frequency of Communication with Friends and Family: Twice a week    Frequency of Social Gatherings with Friends and Family: Twice a week    Attends Religious Services: More than 4 times per year    Active Member of [employer] or Organizations: Yes    Attends Banker Meetings: More than 4 times per year    Marital Status: Married  Catering manager Violence: Not At Risk (10/17/2023)   Humiliation, Afraid, Rape, and Kick questionnaire    Fear of Current or Ex-Partner: No    Emotionally Abused: No    Physically Abused: No    Sexually Abused: No    Past Surgical History:  Procedure Laterality Date   A/V FISTULAGRAM Left 06/30/2019   Procedure: A/V FISTULAGRAM;  Surgeon: Celso College, MD;  Location: [facility];  Service: Cardiovascular;  Laterality: Left;   AV FISTULA PLACEMENT Left 02/26/2019   Procedure: ARTERIOVENOUS (AV) FISTULA CREATION ( RADIAL CEPHALIC );  Surgeon: Celso College, MD;  Location: [facility];  Service: Vascular;  Laterality: Left;   AV FISTULA PLACEMENT Left 04/16/2019   Procedure: ARTERIOVENOUS (AV) FISTULA CREATION ( BRACHIAL CEPHALIC );  Surgeon: Celso College, MD;  Location: [facility];  Service: Vascular;  Laterality: Left;   BREAST BIOPSY Right 12/07/2021   Stereo Bx ribbon clip- path pending   BREAST BIOPSY Left 12/07/2021   Us  Core Bx 1200 5cmfn ribbon clip- path pending   BREAST BIOPSY Left 12/07/2021   US  Core BX 300 Coil clip - Path pending   DIALYSIS/PERMA CATHETER INSERTION N/A 08/22/2018   Procedure: DIALYSIS temporary catheter INSERTION;  Surgeon: Celso College, MD;  Location: [facility];  Service: Cardiovascular;  Laterality: N/A;   DIALYSIS/PERMA CATHETER INSERTION N/A 08/26/2018   Procedure: DIALYSIS/PERMA CATHETER INSERTION;  Surgeon: Celso College, MD;  Location: [facility];  Service: Cardiovascular;  Laterality: N/A;   DIALYSIS/PERMA CATHETER INSERTION N/A 01/01/2019    Procedure: DIALYSIS/PERMA CATHETER INSERTION;  Surgeon: Celso College, MD;  Location: [facility];  Service: Cardiovascular;  Laterality: N/A;   DIALYSIS/PERMA CATHETER REMOVAL N/A 12/29/2019   Procedure: DIALYSIS/PERMA CATHETER REMOVAL;  Surgeon: Celso College, MD;  Location: [facility];  Service: Cardiovascular;  Laterality: N/A;   FISTULA SUPERFICIALIZATION Left 10/29/2019   Procedure: FISTULA SUPERFICIALIZATION;  Surgeon: Celso College, MD;  Location: [facility];  Service: Vascular;  Laterality: Left;   none     PERIPHERAL VASCULAR THROMBECTOMY Left 03/12/2019   Procedure: PERIPHERAL VASCULAR THROMBECTOMY;  Surgeon: Celso College, MD;  Location: [facility];  Service: Cardiovascular;  Laterality: Left;   PERIPHERAL VASCULAR THROMBECTOMY Left 03/24/2020   Procedure: PERIPHERAL VASCULAR THROMBECTOMY;  Surgeon: Celso College, MD;  Location: [facility];  Service: Cardiovascular;  Laterality: Left;    Family History  Problem Relation Age of Onset   Breast cancer Maternal Grandmother    COPD Neg Hx    Diabetes Neg Hx    Hypertension Neg Hx    CAD Neg Hx     Allergies  Allergen Reactions   Cat Dander Hives       Latest Ref Rng & Units 08/22/2021   10:47 AM 10/07/2020    6:17 AM 10/06/2020    1:22 PM  CBC  WBC 3.8 - 10.8 Thousand/uL 8.7  5.8  9.9   Hemoglobin 11.7 - 15.5 g/dL 78.2  95.6  21.3   Hematocrit 35.0 - 45.0 % 38.6  32.0  39.5   Platelets 140 - 400 Thousand/uL 315  205  271        CMP     Component Value Date/Time   NA 138 08/22/2021 1047   NA 135 (L) 10/09/2013 1548   K 3.7 08/22/2021 1047   K 3.6 10/09/2013 1548   CL 94 (L) 08/22/2021 1047   CL 106 10/09/2013 1548   CO2 25 08/22/2021 1047   CO2 23 10/09/2013 1548   GLUCOSE 100 (H) 08/22/2021 1047   GLUCOSE 84 10/09/2013 1548   BUN 53 (H) 08/22/2021 1047   BUN 16 10/09/2013 1548   CREATININE 3.48 (H) 08/22/2021 1047   CALCIUM  9.5 08/22/2021 1047   CALCIUM  8.9 10/09/2013 1548    PROT 7.4 08/22/2021 1047   PROT 8.6 (H) 10/09/2013 1548   ALBUMIN 3.3 (L) 10/07/2020 0617   ALBUMIN 4.0 10/09/2013 1548   AST 28 08/22/2021 1047   AST 36 10/09/2013 1548   ALT 27 08/22/2021 1047   ALT 38 10/09/2013 1548   ALKPHOS 197 (H) 10/06/2020 1322   ALKPHOS 118 (H) 10/09/2013 1548   BILITOT 0.3 08/22/2021 1047   BILITOT 0.3 10/09/2013 1548   EGFR 15 (L) 08/22/2021 1047   GFRNONAA 26 (L) 10/08/2020 0448   GFRNONAA >60 10/09/2013 1548     No results found.     Assessment & Plan:   1. ESRD on dialysis [facility]) (Primary) The patient's referral for concern regarding stenosis was sent about 3 months ago.  This also happened during the time where she had a painful stick.  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  There is a small pseudoaneurysm which I suspect was because by this painful stick.  Will have her return in 3 months to evaluate to ensure that the pseudoaneurysm is decreasing as well as to evaluate how the quality of her dialysis treatments have been going.  2. Essential hypertension Continue antihypertensive medications as already ordered, these medications have been reviewed and there are no changes at this time.  3. Hyperlipidemia, unspecified hyperlipidemia type Continue statin as ordered and reviewed, no changes at this time   Current Outpatient Medications on File Prior to Visit  Medication Sig Dispense Refill   hydrOXYzine  (VISTARIL ) 25 MG capsule Take 1 capsule (25 mg total) by mouth every 8 (eight) hours as needed.  30 capsule 0   No current facility-administered medications on file prior to visit.    There are no Patient Instructions on file for this visit. No follow-ups on file.   Jahmel Flannagan E Margurite Duffy, NP

## 2023-12-07 ENCOUNTER — Ambulatory Visit (INDEPENDENT_AMBULATORY_CARE_PROVIDER_SITE_OTHER): Payer: Medicare Other | Admitting: Nurse Practitioner

## 2023-12-07 ENCOUNTER — Encounter (INDEPENDENT_AMBULATORY_CARE_PROVIDER_SITE_OTHER): Payer: Medicare Other

## 2024-02-21 ENCOUNTER — Other Ambulatory Visit (INDEPENDENT_AMBULATORY_CARE_PROVIDER_SITE_OTHER): Payer: Self-pay | Admitting: Nurse Practitioner

## 2024-02-21 DIAGNOSIS — N186 End stage renal disease: Secondary | ICD-10-CM

## 2024-03-04 ENCOUNTER — Ambulatory Visit (INDEPENDENT_AMBULATORY_CARE_PROVIDER_SITE_OTHER): Admitting: Vascular Surgery

## 2024-03-04 ENCOUNTER — Ambulatory Visit (INDEPENDENT_AMBULATORY_CARE_PROVIDER_SITE_OTHER)

## 2024-03-04 ENCOUNTER — Encounter (INDEPENDENT_AMBULATORY_CARE_PROVIDER_SITE_OTHER): Payer: Self-pay | Admitting: Vascular Surgery

## 2024-03-04 VITALS — BP 126/85 | HR 61 | Ht 63.0 in | Wt 199.4 lb

## 2024-03-04 DIAGNOSIS — N186 End stage renal disease: Secondary | ICD-10-CM

## 2024-03-04 DIAGNOSIS — I10 Essential (primary) hypertension: Secondary | ICD-10-CM

## 2024-03-04 DIAGNOSIS — Z992 Dependence on renal dialysis: Secondary | ICD-10-CM

## 2024-03-04 NOTE — Assessment & Plan Note (Signed)
 blood pressure control important in reducing the progression of atherosclerotic disease. On appropriate oral medications.

## 2024-03-04 NOTE — Progress Notes (Signed)
 MRN : [id number]  Jennifer Newman is a 58 y.o. (July 06, 1965) female who presents with chief complaint of  Chief Complaint  Patient presents with   Follow-up  .  History of Present Illness: Patient returns today in follow up of her dialysis access.  She is doing well.  She had an infiltration with a small pseudoaneurysm a few months ago.  This has completely resolved and she has no further pain in her arm.  Her fistula is working well for dialysis.  She is on the transplant list at this point.  Her duplex today shows a patent left brachiocephalic AV fistula without significant stenosis or other issues.  No current outpatient medications on file.   No current facility-administered medications for this visit.    Past Medical History:  Diagnosis Date   Anxiety    Bipolar disorder (HCC)    Chronic kidney disease    Depression    Dysrhythmia    tachycardia   GERD (gastroesophageal reflux disease)    Hypertension    Obsessive-compulsive disorder    PTSD (post-traumatic stress disorder)    Tachycardia, unspecified    Vertigo     Past Surgical History:  Procedure Laterality Date   A/V FISTULAGRAM Left 06/30/2019   Procedure: A/V FISTULAGRAM;  Surgeon: Marea Selinda RAMAN, MD;  Location: [facility];  Service: Cardiovascular;  Laterality: Left;   AV FISTULA PLACEMENT Left 02/26/2019   Procedure: ARTERIOVENOUS (AV) FISTULA CREATION ( RADIAL CEPHALIC );  Surgeon: Marea Selinda RAMAN, MD;  Location: [facility];  Service: Vascular;  Laterality: Left;   AV FISTULA PLACEMENT Left 04/16/2019   Procedure: ARTERIOVENOUS (AV) FISTULA CREATION ( BRACHIAL CEPHALIC );  Surgeon: Marea Selinda RAMAN, MD;  Location: [facility];  Service: Vascular;  Laterality: Left;   BREAST BIOPSY Right 12/07/2021   Stereo Bx ribbon clip- path pending   BREAST BIOPSY Left 12/07/2021   Us  Core Bx 1200 5cmfn ribbon clip- path pending   BREAST BIOPSY Left 12/07/2021   US  Core BX 300 Coil clip - Path pending   DIALYSIS/PERMA  CATHETER INSERTION N/A 08/22/2018   Procedure: DIALYSIS temporary catheter INSERTION;  Surgeon: Marea Selinda RAMAN, MD;  Location: [facility];  Service: Cardiovascular;  Laterality: N/A;   DIALYSIS/PERMA CATHETER INSERTION N/A 08/26/2018   Procedure: DIALYSIS/PERMA CATHETER INSERTION;  Surgeon: Marea Selinda RAMAN, MD;  Location: [facility];  Service: Cardiovascular;  Laterality: N/A;   DIALYSIS/PERMA CATHETER INSERTION N/A 01/01/2019   Procedure: DIALYSIS/PERMA CATHETER INSERTION;  Surgeon: Marea Selinda RAMAN, MD;  Location: [facility];  Service: Cardiovascular;  Laterality: N/A;   DIALYSIS/PERMA CATHETER REMOVAL N/A 12/29/2019   Procedure: DIALYSIS/PERMA CATHETER REMOVAL;  Surgeon: Marea Selinda RAMAN, MD;  Location: [facility];  Service: Cardiovascular;  Laterality: N/A;   FISTULA SUPERFICIALIZATION Left 10/29/2019   Procedure: FISTULA SUPERFICIALIZATION;  Surgeon: Marea Selinda RAMAN, MD;  Location: [facility];  Service: Vascular;  Laterality: Left;   none     PERIPHERAL VASCULAR THROMBECTOMY Left 03/12/2019   Procedure: PERIPHERAL VASCULAR THROMBECTOMY;  Surgeon: Marea Selinda RAMAN, MD;  Location: [facility];  Service: Cardiovascular;  Laterality: Left;   PERIPHERAL VASCULAR THROMBECTOMY Left 03/24/2020   Procedure: PERIPHERAL VASCULAR THROMBECTOMY;  Surgeon: Marea Selinda RAMAN, MD;  Location: [facility];  Service: Cardiovascular;  Laterality: Left;     Social History   Tobacco Use   Smoking status: Never   Smokeless tobacco: Never  Vaping Use   Vaping  status: Never Used  Substance Use Topics   Alcohol use: Not Currently    Alcohol/week: 0.0 standard drinks of alcohol   Drug use: Not Currently    Types: Marijuana    Comment: > 67yrs      Family History  Problem Relation Age of Onset   Breast cancer Maternal Grandmother    COPD Neg Hx    Diabetes Neg Hx    Hypertension Neg Hx    CAD Neg Hx      Allergies  Allergen Reactions   Cat Dander Hives        REVIEW OF SYSTEMS (Negative unless checked)   Constitutional: [] Weight loss  [] Fever  [] Chills Cardiac: [] Chest pain   [] Chest pressure   [x] Palpitations   [] Shortness of breath when laying flat   [] Shortness of breath at rest   [] Shortness of breath with exertion. Vascular:  [] Pain in legs with walking   [] Pain in legs at rest   [] Pain in legs when laying flat   [] Claudication   [] Pain in feet when walking  [] Pain in feet at rest  [] Pain in feet when laying flat   [] History of DVT   [] Phlebitis   [] Swelling in legs   [] Varicose veins   [] Non-healing ulcers Pulmonary:   [] Uses home oxygen   [] Productive cough   [] Hemoptysis   [] Wheeze  [] COPD   [] Asthma Neurologic:  [] Dizziness  [] Blackouts   [] Seizures   [] History of stroke   [] History of TIA  [] Aphasia   [] Temporary blindness   [] Dysphagia   [] Weakness or numbness in arms   [] Weakness or numbness in legs Musculoskeletal:  [x] Arthritis   [] Joint swelling   [] Joint pain   [] Low back pain Hematologic:  [] Easy bruising  [] Easy bleeding   [] Hypercoagulable state   [x] Anemic   Gastrointestinal:  [] Blood in stool   [] Vomiting blood  [x] Gastroesophageal reflux/heartburn   [] Abdominal pain Genitourinary:  [x] Chronic kidney disease   [] Difficult urination  [] Frequent urination  [] Burning with urination   [] Hematuria Skin:  [] Rashes   [] Ulcers   [] Wounds Psychological:  [x] History of anxiety   [x]  History of major depression.  Physical Examination  BP 126/85   Pulse 61   Ht 5' 3 (1.6 m)   Wt 199 lb 6 oz (90.4 kg)   LMP 08/31/2018   BMI 35.32 kg/m  Gen:  WD/WN, NAD Head: [place]/AT, No temporalis wasting. Ear/Nose/Throat: Hearing grossly intact, nares w/o erythema or drainage Eyes: Conjunctiva clear. Sclera non-icteric Neck: Supple.  Trachea midline Pulmonary:  Good air movement, no use of accessory muscles.  Cardiac: RRR, no JVD Vascular: left arm AVF present with good flow Vessel Right Left  Radial Palpable Palpable            Musculoskeletal: M/S 5/5 throughout.  No deformity or atrophy. No edema. Neurologic: Sensation grossly intact in extremities.  Symmetrical.  Speech is fluent.  Psychiatric: Judgment intact, Mood & affect appropriate for pt's clinical situation. Dermatologic: No rashes or ulcers noted.  No cellulitis or open wounds.      Labs No results found for this or any previous visit (from the past 2160 hours).  Radiology No results found.  Assessment/Plan  Essential hypertension blood pressure control important in reducing the progression of atherosclerotic disease. On appropriate oral medications.   ESRD on dialysis [facility]) Her duplex today shows a patent left brachiocephalic AV fistula without significant stenosis or other issues.  Continue to use the fistula and rotate her access sites as possible.  If  she receives a transplant, fistula would just remain in place for a backup in the future.  Follow-up in 1 year with duplex.    Selinda Gu, MD  03/04/2024 10:42 AM    This note was created with Dragon medical transcription system.  Any errors from dictation are purely unintentional

## 2024-03-04 NOTE — Assessment & Plan Note (Signed)
 Her duplex today shows a patent left brachiocephalic AV fistula without significant stenosis or other issues.  Continue to use the fistula and rotate her access sites as possible.  If she receives a transplant, fistula would just remain in place for a backup in the future.  Follow-up in 1 year with duplex.

## 2024-03-18 ENCOUNTER — Ambulatory Visit (INDEPENDENT_AMBULATORY_CARE_PROVIDER_SITE_OTHER): Admitting: Nurse Practitioner

## 2024-03-18 ENCOUNTER — Encounter: Payer: Self-pay | Admitting: Nurse Practitioner

## 2024-03-18 VITALS — BP 122/78 | HR 95 | Temp 98.4°F | Ht 63.0 in | Wt 200.2 lb

## 2024-03-18 DIAGNOSIS — J069 Acute upper respiratory infection, unspecified: Secondary | ICD-10-CM

## 2024-03-18 MED ORDER — AZITHROMYCIN 250 MG PO TABS: ORAL_TABLET | ORAL | 0 refills | Status: AC

## 2024-03-18 NOTE — Progress Notes (Signed)
 Established Patient Office Visit  Subjective:  Patient ID: Jennifer Newman, female    DOB: 23-Apr-1966  Age: 58 y.o. MRN: [id number]  CC:  Chief Complaint  Patient presents with   Cough    Cough x 3  weeks Denies any other symptoms    Discussed the use of a AI scribe software for clinical note transcription with the patient, who gave verbal consent to proceed.  HPI  Jennifer Newman presents for cough  Cough This is a new problem. The current episode started 1 to 4 weeks ago. The problem has been gradually worsening. The problem occurs every few hours. The cough is Productive of sputum. Associated symptoms include postnasal drip. Pertinent negatives include no chest pain, chills, fever, rhinorrhea or shortness of breath. The symptoms are aggravated by lying down. She has tried OTC cough suppressant for the symptoms. The treatment provided no relief.     Past Medical History:  Diagnosis Date   Anxiety    Bipolar disorder (HCC)    Chronic kidney disease    Depression    Dysrhythmia    tachycardia   GERD (gastroesophageal reflux disease)    Hypertension    Obsessive-compulsive disorder    PTSD (post-traumatic stress disorder)    Tachycardia, unspecified    Vertigo     Past Surgical History:  Procedure Laterality Date   A/V FISTULAGRAM Left 06/30/2019   Procedure: A/V FISTULAGRAM;  Surgeon: Marea Selinda RAMAN, MD;  Location: [facility];  Service: Cardiovascular;  Laterality: Left;   AV FISTULA PLACEMENT Left 02/26/2019   Procedure: ARTERIOVENOUS (AV) FISTULA CREATION ( RADIAL CEPHALIC );  Surgeon: Marea Selinda RAMAN, MD;  Location: [facility];  Service: Vascular;  Laterality: Left;   AV FISTULA PLACEMENT Left 04/16/2019   Procedure: ARTERIOVENOUS (AV) FISTULA CREATION ( BRACHIAL CEPHALIC );  Surgeon: Marea Selinda RAMAN, MD;  Location: [facility];  Service: Vascular;  Laterality: Left;   BREAST BIOPSY Right 12/07/2021   Stereo Bx ribbon clip- path pending   BREAST BIOPSY Left  12/07/2021   Us  Core Bx 1200 5cmfn ribbon clip- path pending   BREAST BIOPSY Left 12/07/2021   US  Core BX 300 Coil clip - Path pending   DIALYSIS/PERMA CATHETER INSERTION N/A 08/22/2018   Procedure: DIALYSIS temporary catheter INSERTION;  Surgeon: Marea Selinda RAMAN, MD;  Location: [facility];  Service: Cardiovascular;  Laterality: N/A;   DIALYSIS/PERMA CATHETER INSERTION N/A 08/26/2018   Procedure: DIALYSIS/PERMA CATHETER INSERTION;  Surgeon: Marea Selinda RAMAN, MD;  Location: [facility];  Service: Cardiovascular;  Laterality: N/A;   DIALYSIS/PERMA CATHETER INSERTION N/A 01/01/2019   Procedure: DIALYSIS/PERMA CATHETER INSERTION;  Surgeon: Marea Selinda RAMAN, MD;  Location: [facility];  Service: Cardiovascular;  Laterality: N/A;   DIALYSIS/PERMA CATHETER REMOVAL N/A 12/29/2019   Procedure: DIALYSIS/PERMA CATHETER REMOVAL;  Surgeon: Marea Selinda RAMAN, MD;  Location: [facility];  Service: Cardiovascular;  Laterality: N/A;   FISTULA SUPERFICIALIZATION Left 10/29/2019   Procedure: FISTULA SUPERFICIALIZATION;  Surgeon: Marea Selinda RAMAN, MD;  Location: [facility];  Service: Vascular;  Laterality: Left;   none     PERIPHERAL VASCULAR THROMBECTOMY Left 03/12/2019   Procedure: PERIPHERAL VASCULAR THROMBECTOMY;  Surgeon: Marea Selinda RAMAN, MD;  Location: [facility];  Service: Cardiovascular;  Laterality: Left;   PERIPHERAL VASCULAR THROMBECTOMY Left 03/24/2020   Procedure: PERIPHERAL VASCULAR THROMBECTOMY;  Surgeon: Marea Selinda RAMAN, MD;  Location: [facility];  Service: Cardiovascular;  Laterality: Left;  Family History  Problem Relation Age of Onset   Breast cancer Maternal Grandmother    COPD Neg Hx    Diabetes Neg Hx    Hypertension Neg Hx    CAD Neg Hx     Social History   Socioeconomic History   Marital status: Married    Spouse name: Ambulance person   Number of children: 2   Years of education: Not on file   Highest education level: Associate degree: occupational,  Scientist, product/process development, or vocational program  Occupational History   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    Comment: not employed  Tobacco Use   Smoking status: Never   Smokeless tobacco: Never  Vaping Use   Vaping status: Never Used  Substance and Sexual Activity   Alcohol use: Not Currently    Alcohol/week: 0.0 standard drinks of alcohol   Drug use: Not Currently    Types: Marijuana    Comment: > 18yrs   Sexual activity: Not Currently    Birth control/protection: Post-menopausal  Other Topics Concern   Not on file  Social History Narrative   married   Social Drivers of Corporate investment banker Strain: Low Risk  (10/17/2023)   Overall Financial Resource Strain (CARDIA)    Difficulty of Paying Living Expenses: Not hard at all  Food Insecurity: No Food Insecurity (10/17/2023)   Hunger Vital Sign    Worried About Running Out of Food in the Last Year: Never true    Ran Out of Food in the Last Year: Never true  Transportation Needs: No Transportation Needs (10/17/2023)   PRAPARE - Administrator, Civil Service (Medical): No    Lack of Transportation (Non-Medical): No  Physical Activity: Inactive (10/17/2023)   Exercise Vital Sign    Days of Exercise per Week: 0 days    Minutes of Exercise per Session: 0 min  Stress: No Stress Concern Present (10/17/2023)   [employer] of Occupational Health - Occupational Stress Questionnaire    Feeling of Stress : Not at all  Social Connections: Socially Integrated (10/17/2023)   Social Connection and Isolation Panel    Frequency of Communication with Friends and Family: Twice a week    Frequency of Social Gatherings with Friends and Family: Twice a week    Attends Religious Services: More than 4 times per year    Active Member of [employer] or Organizations: Yes    Attends Engineer, structural: More than 4 times per year    Marital Status: Married  Catering manager Violence: Not At Risk (10/17/2023)    Humiliation, Afraid, Rape, and Kick questionnaire    Fear of Current or Ex-Partner: No    Emotionally Abused: No    Physically Abused: No    Sexually Abused: No     No outpatient medications prior to visit.   No facility-administered medications prior to visit.    Allergies  Allergen Reactions   Cat Dander Hives    ROS Review of Systems  Constitutional:  Negative for chills and fever.  HENT:  Positive for postnasal drip. Negative for rhinorrhea.   Respiratory:  Positive for cough. Negative for shortness of breath.   Cardiovascular:  Negative for chest pain.   Negative unless indicated in HPI.    Objective:    Physical Exam HENT:     Right Ear: Tympanic membrane normal. Tympanic membrane is not erythematous.     Left Ear: Tympanic membrane normal. Tympanic membrane is not erythematous.  Nose:     Right Turbinates: Not enlarged.     Left Turbinates: Not enlarged.     Right Sinus: No maxillary sinus tenderness or frontal sinus tenderness.     Left Sinus: No maxillary sinus tenderness or frontal sinus tenderness.     Mouth/Throat:     Mouth: Mucous membranes are moist.     Pharynx: Postnasal drip present. No pharyngeal swelling, oropharyngeal exudate or posterior oropharyngeal erythema.     Tonsils: No tonsillar exudate.  Cardiovascular:     Rate and Rhythm: Normal rate and regular rhythm.  Pulmonary:     Effort: Pulmonary effort is normal.     Breath sounds: Normal breath sounds. No stridor. No wheezing.  Neurological:     General: No focal deficit present.     Mental Status: She is alert and oriented to person, place, and time. Mental status is at baseline.  Psychiatric:        Mood and Affect: Mood normal.        Behavior: Behavior normal.        Thought Content: Thought content normal.        Judgment: Judgment normal.     BP 122/78   Pulse 95   Temp 98.4 F (36.9 C)   Ht 5' 3 (1.6 m)   Wt 200 lb 3.2 oz (90.8 kg)   LMP 08/31/2018   SpO2 97%   BMI  35.46 kg/m  Wt Readings from Last 3 Encounters:  03/18/24 200 lb 3.2 oz (90.8 kg)  03/04/24 199 lb 6 oz (90.4 kg)  12/04/23 199 lb 6.4 oz (90.4 kg)     Health Maintenance  Topic Date Due   Zoster Vaccines- Shingrix (1 of 2) Never done   Pneumococcal Vaccine: 50+ Years (2 of 2 - PPSV23, PCV20, or PCV21) 11/19/2019   DTaP/Tdap/Td (2 - Td or Tdap) 06/01/2021   Influenza Vaccine  09/30/2024 (Originally 02/01/2024)   Medicare Annual Wellness (AWV)  10/16/2024   Mammogram  04/11/2025   Cervical Cancer Screening (HPV/Pap Cotest)  08/18/2025   Fecal DNA (Cologuard)  08/29/2025   Hepatitis B Vaccines 19-59 Average Risk  Completed   Hepatitis C Screening  Completed   HIV Screening  Completed   HPV VACCINES  Aged Out   Meningococcal B Vaccine  Aged Out   COVID-19 Vaccine  Discontinued    There are no preventive care reminders to display for this patient.  Lab Results  Component Value Date   TSH 0.74 08/22/2021   Lab Results  Component Value Date   WBC 8.7 08/22/2021   HGB 12.5 08/22/2021   HCT 38.6 08/22/2021   MCV 87.7 08/22/2021   PLT 315 08/22/2021   Lab Results  Component Value Date   NA 138 08/22/2021   K 3.7 08/22/2021   CO2 25 08/22/2021   GLUCOSE 100 (H) 08/22/2021   BUN 53 (H) 08/22/2021   CREATININE 3.48 (H) 08/22/2021   BILITOT 0.3 08/22/2021   ALKPHOS 197 (H) 10/06/2020   AST 28 08/22/2021   ALT 27 08/22/2021   PROT 7.4 08/22/2021   ALBUMIN 3.3 (L) 10/07/2020   CALCIUM  9.5 08/22/2021   ANIONGAP 10 10/08/2020   EGFR 15 (L) 08/22/2021   Lab Results  Component Value Date   CHOL 291 (H) 08/22/2021   Lab Results  Component Value Date   HDL 45 (L) 08/22/2021   Lab Results  Component Value Date   LDLCALC 199 (H) 08/22/2021   Lab Results  Component Value  Date   TRIG 263 (H) 08/22/2021   Lab Results  Component Value Date   CHOLHDL 6.5 (H) 08/22/2021   No results found for: HGBA1C    Assessment & Plan:  URI with cough and  congestion Assessment & Plan: Persistent nonproductive cough with clear sputum likely due to postnasal drip. Wheezing aggravated when lying down. Over-the-counter medications ineffective. - Prescribed azithromycin  (Z-Pak) and sent prescription to [facility] [address]. - Advised cetirizine (Zyrtec) for postnasal drip management. - Encouraged increased fluid intake and salt water gargles for throat irritation.    Other orders -     Azithromycin ; Take 2 tablets on day 1, then 1 tablet daily on days 2 through 5  Dispense: 6 tablet; Refill: 0    Follow-up: No follow-ups on file.   Knox Cervi, NP

## 2024-04-01 NOTE — Assessment & Plan Note (Signed)
 Persistent nonproductive cough with clear sputum likely due to postnasal drip. Wheezing aggravated when lying down. Over-the-counter medications ineffective. - Prescribed azithromycin  (Z-Pak) and sent prescription to [facility] [address]. - Advised cetirizine (Zyrtec) for postnasal drip management. - Encouraged increased fluid intake and salt water gargles for throat irritation.

## 2024-08-01 ENCOUNTER — Encounter: Payer: Self-pay | Admitting: Nurse Practitioner

## 2024-08-01 ENCOUNTER — Ambulatory Visit: Payer: Self-pay

## 2024-08-01 ENCOUNTER — Ambulatory Visit: Admitting: Nurse Practitioner

## 2024-08-01 VITALS — BP 124/68 | HR 60 | Temp 98.0°F | Ht 63.0 in | Wt 198.2 lb

## 2024-08-01 DIAGNOSIS — R399 Unspecified symptoms and signs involving the genitourinary system: Secondary | ICD-10-CM | POA: Insufficient documentation

## 2024-08-01 DIAGNOSIS — N186 End stage renal disease: Secondary | ICD-10-CM

## 2024-08-01 LAB — URINALYSIS, ROUTINE W REFLEX MICROSCOPIC
Hgb urine dipstick: NEGATIVE
Ketones, ur: 15 — AB
Nitrite: POSITIVE — AB
Specific Gravity, Urine: 1.015 (ref 1.000–1.030)
Total Protein, Urine: >=300 — AB
Urine Glucose: 100 — AB
Urobilinogen, UA: >=8 — AB (ref 0.0–1.0)
pH: 6.5 (ref 5.0–8.0)

## 2024-08-01 MED ORDER — FOSFOMYCIN TROMETHAMINE 3 G PO PACK: 3.0000 g | PACK | Freq: Once | ORAL | 0 refills | Status: AC

## 2024-08-01 NOTE — Progress Notes (Signed)
" °  Leron Glance, NP-C Phone: [phone]  Discussed the use of AI scribe software for clinical note transcription with the patient, who gave verbal consent to proceed.  History of Present Illness   Jennifer Newman is a 59 year old female on dialysis who presents with symptoms suggestive of a urinary tract infection (UTI).  She has been experiencing pain for the past two days, described as 'pulsating' and 'nerve-like,' located in the urethra and radiating down her leg to her foot. No back pain or abdominal pain. She notes increased frequency and urgency of urination but denies burning sensation during urination, blood in urine, or fever.  She used an over-the-counter UTI test kit, which tested positive for a UTI. She has taken medication from the kit, which turned her urine orange, preventing an in-office urine test. She has a history of UTIs but states this episode feels different from past experiences.  She is currently on dialysis. She has a history of Clostridioides difficile (C. diff) infection approximately three to four years ago and rarely takes antibiotics.      Tobacco Use History[1]  Medications Ordered Prior to Encounter[2]   ROS see history of present illness  Objective  Physical Exam Vitals:   08/01/24 1053  BP: 124/68  Pulse: 60  Temp: 98 F (36.7 C)  SpO2: 95%    BP Readings from Last 3 Encounters:  08/01/24 124/68  03/18/24 122/78  03/04/24 126/85   Wt Readings from Last 3 Encounters:  08/01/24 198 lb 3.2 oz (89.9 kg)  03/18/24 200 lb 3.2 oz (90.8 kg)  03/04/24 199 lb 6 oz (90.4 kg)    Physical Exam Constitutional:      General: She is not in acute distress.    Appearance: Normal appearance.  HENT:     Head: Normocephalic.  Cardiovascular:     Rate and Rhythm: Normal rate and regular rhythm.     Heart sounds: Normal heart sounds.  Pulmonary:     Effort: Pulmonary effort is normal.     Breath sounds: Normal breath sounds.  Skin:    General:  Skin is warm and dry.  Neurological:     General: No focal deficit present.     Mental Status: She is alert.  Psychiatric:        Mood and Affect: Mood normal.        Behavior: Behavior normal.      Assessment/Plan: Please see individual problem list.  UTI symptoms Assessment & Plan: Symptoms indicate a UTI, supported by a positive OTC test. Urine analysis is not feasible due to medication interference. Prescribe Fosfomycin  3 g once for one dose. No adjustment needed for dialysis. Send urine for UA and culture. Advise hospital care if she experiences high fevers or worsening symptoms.   Orders: -     Urinalysis, Routine w reflex microscopic -     Urine Culture -     Fosfomycin  Tromethamine ; Take 3 g by mouth once for 1 dose.  Dispense: 3 g; Refill: 0  ESRD on dialysis [facility])     Return if symptoms worsen or fail to improve.   Leron Glance, NP-C [facility] [place]    [1]  Social History Tobacco Use  Smoking Status Never  Smokeless Tobacco Never  [2]  No current outpatient medications on file prior to visit.   No current facility-administered medications on file prior to visit.   "

## 2024-08-01 NOTE — Telephone Encounter (Signed)
 FYI Only or Action Required?: FYI only for provider: appointment scheduled on 08/01/24.  Patient was last seen in primary care on 03/18/2024 by Vincente Saber, NP.  Called Nurse Triage reporting Vaginal Pain, Urinary Frequency, and Urinary Urgency.  Symptoms began several days ago.  Interventions attempted: Nothing.  Symptoms are: unchanged.  Triage Disposition: See Physician Within 24 Hours  Patient/caregiver understands and will follow disposition?: Yes       Message from [place] S sent at 08/01/2024  8:33 AM EST  Summary: UTI   Reason for Triage: UTI,(nerves pain in vaginal area down to foot, frequent urination with urgency); requesting an appt         Reason for Disposition  Age > 50 years  Answer Assessment - Initial Assessment Questions Home test positive for UTI. Difficulty triaging patient due to can not hear her and having to repeat myself, loud background noise (patient is at dialysis).  1. SEVERITY: How bad is the pain?  (e.g., Scale 1-10; mild, moderate, or severe)     Vaginal pain. Radiates down to legs (down to foot in left leg), like a nerve pain pulsating 6-8/10.  2. FREQUENCY: How many times have you had painful urination today?      Once.  3. PATTERN: Is pain present every time you urinate or just sometimes?      Not related to urinating.  4. ONSET: When did the painful urination start?      2 nights ago.  5. FEVER: Do you have a fever? If Yes, ask: What is your temperature, how was it measured, and when did it start?     No.  6. PAST UTI: Have you had a urine infection before? If Yes, ask: When was the last time? and What happened that time?      Yes, years ago.  7. CAUSE: What do you think is causing the painful urination?  (e.g., UTI, scratch, Herpes sore)     UTI.  8. OTHER SYMPTOMS: Do you have any other symptoms? (e.g., blood in urine, flank pain, genital sores, urgency, vaginal discharge)     No vomiting,  urinary retention, blood in urine, fever,  Protocols used: Urination Pain - Female-A-AH

## 2024-08-01 NOTE — Assessment & Plan Note (Addendum)
 Symptoms indicate a UTI, supported by a positive OTC test. Urine analysis is not feasible due to medication interference. Prescribe Fosfomycin  3 g once for one dose. No adjustment needed for dialysis. Send urine for UA and culture. Advise hospital care if she experiences high fevers or worsening symptoms.

## 2024-08-03 LAB — URINE CULTURE
MICRO NUMBER:: 17532269
Result:: NO GROWTH
SPECIMEN QUALITY:: ADEQUATE

## 2024-08-05 ENCOUNTER — Ambulatory Visit: Payer: Self-pay | Admitting: Nurse Practitioner

## 2024-10-21 ENCOUNTER — Ambulatory Visit

## 2025-03-03 ENCOUNTER — Ambulatory Visit (INDEPENDENT_AMBULATORY_CARE_PROVIDER_SITE_OTHER): Admitting: Nurse Practitioner

## 2025-03-03 ENCOUNTER — Encounter (INDEPENDENT_AMBULATORY_CARE_PROVIDER_SITE_OTHER)
